# Patient Record
Sex: Male | Born: 1985 | Race: Black or African American | Hispanic: No | Marital: Single
Health system: Midwestern US, Community
[De-identification: ages and names within clinical notes are randomized; demographics above are authoritative.]

## PROBLEM LIST (undated history)

## (undated) DIAGNOSIS — M792 Neuralgia and neuritis, unspecified: Secondary | ICD-10-CM

## (undated) DIAGNOSIS — G40909 Epilepsy, unspecified, not intractable, without status epilepticus: Principal | ICD-10-CM

## (undated) DIAGNOSIS — R569 Unspecified convulsions: Secondary | ICD-10-CM

## (undated) DIAGNOSIS — D72819 Decreased white blood cell count, unspecified: Secondary | ICD-10-CM

---

## 2011-08-17 ENCOUNTER — Inpatient Hospital Stay
Admit: 2011-08-17 | Disposition: A | Discharge status: Home / Self Care | Source: Home / Self Care | Admitting: Emergency Medicine

## 2011-08-17 LAB — URINALYSIS
Bilirubin Urine: NEGATIVE
Glucose, Ur: NEGATIVE mg/dL
Ketones, Urine: NEGATIVE mg/dL
Leukocyte Esterase, Urine: NEGATIVE
Nitrite, Urine: NEGATIVE
Protein, UA: NEGATIVE mg/dL
Specific Gravity, UA: >=1.03 (ref 1.005–1.035)
Urobilinogen, Urine: 0.2 EU/dL (ref 0.2–1.0)
pH, UA: 5.5 (ref 5.0–8.0)

## 2011-08-17 LAB — DRUG SCREEN MULTI URINE
Amphetamine Screen, Urine: NEGATIVE
Barbiturate Screen, Ur: NEGATIVE
Benzodiazepine Screen, Urine: NEGATIVE
Cannabinoid Scrn, Ur: POSITIVE — AB
Cocaine Metabolite Screen, Urine: NEGATIVE
Opiate Scrn, Ur: POSITIVE — AB
PCP Screen, Urine: NEGATIVE
Tricyclic: NEGATIVE

## 2011-08-17 LAB — POCT UREA (BUN): POC BUN: 5 mg/dL — ABNORMAL LOW (ref 7–25)

## 2011-08-17 LAB — CBC WITH AUTO DIFFERENTIAL
Basophils %: 0 % (ref 0.0–1.0)
Basophils Absolute: 2 /uL (ref 0–200)
Eosinophils %: 0.8 % (ref 0.0–8.0)
Eosinophils Absolute: 45 /uL (ref 15–500)
Hematocrit: 42.4 % (ref 38.5–50.0)
Hemoglobin: 13.8 g/dL (ref 13.2–17.1)
Lymphocytes %: 15.2 % (ref 15.0–45.0)
Lymphocytes Absolute: 870 /uL (ref 850–3900)
MCH: 29.5 pg (ref 27.0–33.0)
MCHC: 32.5 g/dL (ref 32.0–36.0)
MCV: 90.8 fL (ref 80.0–100.0)
MPV: 7.3 fL — ABNORMAL LOW (ref 7.5–11.5)
Monocytes %: 9.6 % (ref 0.0–12.0)
Monocytes Absolute: 548 /uL (ref 200–950)
Neutrophils Absolute: 4263 /uL (ref 1500–7800)
Platelets: 219 10*3/uL (ref 140–400)
RBC: 4.67 10*6/uL (ref 4.20–5.80)
RDW: 13.7 % (ref 11.0–15.0)
Segs Relative: 74.4 % (ref 40.0–80.0)
WBC: 5.7 10*3/uL (ref 3.8–10.8)

## 2011-08-17 LAB — POCT CALCIUM IONIZED: POC Ionized Calcium: 4.8 mg/dL (ref 4.5–5.3)

## 2011-08-17 LAB — HEPATIC FUNCTION PANEL
ALT: 96 U/L — ABNORMAL HIGH (ref 9–60)
AST: 82 U/L — ABNORMAL HIGH (ref 17–59)
Albumin: 4.3 g/dL (ref 3.6–5.1)
Alkaline Phosphatase: 119 U/L — ABNORMAL HIGH (ref 40–115)
Bilirubin, Direct: 0 mg/dL (ref 0.0–0.2)
Total Bilirubin: 0.8 mg/dL (ref 0.2–1.2)
Total Protein: 7.5 g/dL (ref 6.2–8.3)

## 2011-08-17 LAB — RENAL FUNCTION PANEL
1/Creatinine: 1.23 ratio
Albumin: 4.2 g/dL (ref 3.6–5.1)
Anion Gap: 9 mmol/L (ref 3–16)
BUN: 7 mg/dL (ref 7–25)
CO2: 28 mmol/L (ref 21–33)
Calcium: 9.1 mg/dL (ref 8.6–10.2)
Chloride: 102 mmol/L (ref 98–110)
Creatinine: 0.81 mg/dL (ref 0.50–1.30)
GFR Est, African/Amer: 142 See note.
GFR Est: 117 See note.
Glucose: 91 mg/dL (ref 65–99)
Phosphorus: 3.1 mg/dL (ref 2.5–4.5)
Potassium: 4.3 mmol/L (ref 3.5–5.3)
Sodium: 139 mmol/L (ref 135–146)

## 2011-08-17 LAB — POC ANION GAP: POC Anion Gap: 18 (ref 10–20)

## 2011-08-17 LAB — POCT GLUCOSE: POC Glucose: 105 mg/dL — ABNORMAL HIGH (ref 65–99)

## 2011-08-17 LAB — POCT POTASSIUM: POC Potassium: 4.1 mmol/L (ref 3.5–5.3)

## 2011-08-17 LAB — POCT CHLORIDE: POC Chloride: 101 mmol/L (ref 98–110)

## 2011-08-17 LAB — POCT CREATININE: POC Creatinine: 1 mg/dL (ref 0.6–1.3)

## 2011-08-17 LAB — TSH: TSH: 0.61 mIU/L (ref 0.45–4.50)

## 2011-08-17 LAB — CK: Total CK: 501 U/L — ABNORMAL HIGH (ref 20–200)

## 2011-08-17 LAB — LACTIC ACID: Lactate: 0.9 mmol/L (ref 0.5–2.2)

## 2011-08-17 LAB — OSMOLALITY: Osmolality Meas: 275 mOsm/kg — ABNORMAL LOW (ref 278–305)

## 2011-08-17 LAB — POCT OCCULT BLOOD STOOL COLON CA SCREEN 1-3: Fecal Occult Blood, POC: NEGATIVE

## 2011-08-17 LAB — POC TOTAL CO2 MEASURED: POC TCO2 Measured: 26 mmol/L (ref 21–32)

## 2011-08-17 LAB — POCT SODIUM: POC Sodium: 140 mmol/L (ref 135–146)

## 2011-08-17 LAB — T4, FREE: T4 Free: 1.53 ng/dL (ref 0.61–1.76)

## 2011-08-17 LAB — AMMONIA: Ammonia: 51 ug/dL (ref 15–51)

## 2011-08-17 LAB — ETHANOL: Ethanol Lvl: <10 mg/dL (ref 0–10)

## 2011-08-17 LAB — MAGNESIUM: Magnesium: 2.1 mg/dL (ref 1.5–2.5)

## 2011-08-17 MED ADMIN — 0.9 % sodium chloride infusion: INTRAVENOUS | @ 18:00:00 | NDC 00338004904

## 2011-08-17 MED ADMIN — fosphenytoin sodium (CEREBYX) 1,000 mg PE in sodium chloride 0.9 % 100 mL IVPB: INTRAVENOUS | @ 22:00:00 | NDC 63323040302

## 2011-08-17 MED ADMIN — LORazepam (ATIVAN) 2 MG/ML injection: INTRAVENOUS | @ 15:00:00 | NDC 10019010239

## 2011-08-17 MED ADMIN — acetaminophen (TYLENOL) tablet 650 mg: 650 mg | ORAL | @ 23:00:00 | NDC 50580050130

## 2011-08-17 MED FILL — LORAZEPAM 2 MG/ML IJ SOLN: 2 MG/ML | INTRAMUSCULAR | Qty: 1

## 2011-08-17 MED FILL — SUMATRIPTAN SUCCINATE 25 MG PO TABS: 25 MG | ORAL | Qty: 1

## 2011-08-17 MED FILL — FOSPHENYTOIN SODIUM 100 MG PE/2ML IJ SOLN: 100 MG PE/2ML | INTRAMUSCULAR | Qty: 100

## 2011-08-17 MED FILL — ACETAMINOPHEN 325 MG PO TABS: 325 MG | ORAL | Qty: 2

## 2011-08-17 MED FILL — FOSPHENYTOIN SODIUM 100 MG PE/2ML IJ SOLN: 100 MG PE/2ML | INTRAMUSCULAR | Qty: 1000

## 2011-08-17 NOTE — ED Notes (Signed)
Residents in seeing pt.     Gena Fray, RN  08/17/11 1451

## 2011-08-17 NOTE — ED Notes (Signed)
Pt continues sleeping quietly, awakens easily, informed of need for urine sample. Urinal given to pt.    Gena Fray, RN  08/17/11 [phone]

## 2011-08-17 NOTE — ED Notes (Signed)
Report to [facility] on 6th floor.    Gena Fray, RN  08/17/11 1506

## 2011-08-17 NOTE — ED Notes (Signed)
26 yo bm to ed s/p ? Seizure today during sleep. Bit his tongue. States throbbing headache. Seizure precautions in place.    Gordy Levan, RN  08/17/11 1015

## 2011-08-17 NOTE — H&P (Incomplete)
HISTORY AND PHYSICAL      Hospital Day: 1                                                        Code:No Order  Admit Date: 08/17/2011                                             PCP: No primary provider on file.                                  CC: Seizure    HISTORY OF PRESENT ILLNESS:   26 yr old [place] American male with PMH of migraine and seizure presented at ER today with likely seizures. According to the patient, he was visiting his parents at home last night when he woke up from his sleep and found both of his hands shaking. Pt also found laceration on left side of his tongue as well as blood on his pillow, face, and chest. Pt states he felt confused for approximately first two minutes after waking up and does not recall having a seizure. Soon afterwards he vomited bilious material. He denied having any urinary symptoms, SOB, chest pain, fever, chills, diarrhea/constipation. Pt also denied using any alcohol or illegal drugs last night although he admitted having used drugs in the past. According to his father, pt had a history of another seizure last October at [place] but was not admitted to hospital.   During admission, pt was found to be oriented x3 but was in a state of somnolence. He was very slow in responding to our questions and continually fell asleep. We also found what appeared to be track marks on his left arm, but the pt stated they were from past ER admissions.     PAST HISTORY:   Seizure (October, 2012), Migraine    History   Alcohol Use   ??? Yes     social     History   Drug Use   ??? Yes   ??? Special: Marijuana     History   Smoking status   ??? Current Everyday Smoker -- 0.50 packs/day   ??? Types: Cigarettes   Smokeless tobacco   ??? Not on file       Family History:   Uncle: Seizure.  Father: DM    MEDICATIONS:   Home Meds:   No current facility-administered medications on file prior to encounter.     No current outpatient prescriptions on file prior to encounter.     Scheduled Meds:       Continuous Infusions:     PRN Meds:       Allergies: No Known Allergies    REVIEW OF SYSTEMS:       History obtained from {source of history:310783}  ?? GEN:  (+) lethargic, confusion ; (-) fevers, chills, wt gain/loss  ?? HEENT:  (+)  Laceration on left side of tongue. Bloody sputum; (-) sore throat  ?? PULM:  (+)  ; (-) dyspnea, cough, sputum, pleurisy  ?? CV:  (+)  ; (-) chest pain, SOB, edema, syncope  ?? GI:  (+)  ; (-)  diarrhea, constipation, abd pain, incontinence  ?? GU:  (+)  ; (-) dysuria, frequency, urgency, incontinence  ?? HEME:  (+)  ; (-) bleeding, bruising, blood thinners   ?? MSK:  (+)  ; (-) joint pain, swelling  ?? NEURO: (+) weakness ; (-) weakness, paresthesia, gait difficulty  ?? SKIN :  (+)  ; (-) rashes, ulcers  ?? ENDO:  (+)  ; (-) abnormal sugars, hot/cold intolerance    PHYSICAL EXAM:       Vitals: BP 146/67   Pulse 87   Temp(Src) 98.5 ??F (36.9 ??C) (Oral)   Resp 16   Ht 6\' 3"  (1.905 m)   Wt 160 lb (72.576 kg)   BMI 20 kg/m2   SpO2 95%    I/O:  No intake or output data in the 24 hours ending 08/17/11 1326          Physical Examination:   ?? Gen: lethargic, laceration on left side of tongue, appears stated age  ?? Skin: track marks on left arm (pt states these are from past ER admissions)  ?? HEENT: bloody sputum  ?? Pulm: CTAB,   ?? CV: RRR, no murmurs, nl s1/s2, no JVD  ?? GI: soft, no pain, no masses palpated  ?? MSK: periph pulses intact b/l, no periph edema  ?? Neuro: Alert/oriented x3, no focal weakness, pupils reactive, LT sensation grossly intact, CN 2-12 grossly intact/symmetric    DATA:       Labs:  CBC: No results found for this basename: WBC, HGB, HCT, PLT,  in the last 72 hours    BMP:   Recent Labs      08/17/11   1050   CREATININE  1.0     POC Labs:   Recent Labs      08/17/11   1050   POCNA  140   POCK  4.1   POCCL  101   POCBUN  5*   POCGLU  105*        Rad:    CT head w/o contrast (08/17/11)   Opacification of the left maxillary antrum with bowing of the medial antral wall. Findings could be  associated with underlying mass and recommendation is made for standard CT paranasal sinuses for further evaluation.   Otherwise unremarkable CT head    EKG: none on file  Echo: None on file    ASSESSMENT AND PLAN:   Roy Weaver is a 26 y.o. male, who was admitted with Seizures.         Code Status:No Order  FEN:    PPX:   LINES:   DISPO:     -----------------------------  Tania Ade, MS 3 (Serving as scribe)  Pager: [phone]   08/17/2011 1:26 PM

## 2011-08-17 NOTE — ED Notes (Signed)
Straight cath done without difficulty after pt having difficulty voiding in urinal on own. When pants removed to do straight cath, orange cap like the type of cap found on an insulin syringe found in pants. Will inform MD and nurse on 6 th floor. Pt awakens, but groggy, fell asleep after straight cath placed and urine draining. Urine obtained and sent.     Gena Fray, RN  08/17/11 1432

## 2011-08-17 NOTE — ED Notes (Signed)
Report received from Cristopher Peru RN. Pt returned from CT, reported to have 2 seizures in CT. Pt now resting quietly, father at side.     Gena Fray, RN  08/17/11 1448

## 2011-08-17 NOTE — H&P (Signed)
I have seen and examined this pt.  I have reviewed the findings and discussed the care w/ the resident and I agree w/ the plan.  Acute seizure in the setting of known drug abuse hx.  Uncelar whether secondary to opiate vs etoh withdrawal vs underlying organic dz- ? Mass on ct head- obtain mri/ obtain eeg/ initiate iv dilantin/ place on neuro checks and seizure precautions.  Await urine tox screen.  [name], M.D.

## 2011-08-17 NOTE — Progress Notes (Signed)
Pt arrived to unit from ED @ 1520hrs.  Pt somnolent, unresponsive.  Pt whispered name & DOB after several attempts to assess orientation.  Attempted neuro assessment.  PERRLA. Pt uncooperative.  A:  Seizure precautions initiated upon arrival.  Spoke with neurology consult who said they will see patient tomorrow.  Pt more arousable @ 1830. MRI checklist completed & obtained emergency contact for mother, Faheem Ziemann.  Pt in MRI.  Will cont to monitor per POC.  Christianne Dolin RN

## 2011-08-17 NOTE — H&P (Signed)
HISTORY AND PHYSICAL   Hospital Day: 1 Code:No Order   Admit Date: 08/17/2011 PCP: No primary provider on file.   CC: Seizure   HISTORY OF PRESENT ILLNESS:    26 yr old [place] American male with PMH of migraine and seizure presented at ER today with likely seizures. According to the patient, he was visiting his parents at home last night when he woke up from his sleep and found both of his hands shaking. Pt also found laceration on left side of his tongue as well as blood on his pillow, face, and chest. Pt states he felt confused for approximately first two minutes after waking up and does not recall having a seizure. Soon afterwards he vomited bilious material. He denied having any urinary symptoms, SOB, chest pain, fever, chills, diarrhea/constipation. Pt also denied using any alcohol or illegal drugs last night although he admitted having used drugs in the past. According to his father, pt had a history of another seizure last October.  During admission, pt was found to be oriented x3 but was in a state of somnolence. He was very slow in responding to our questions and continually fell asleep. We also found what appeared to be track marks on his left arm, but the pt stated they were from past ER admissions.   PAST HISTORY:    Seizure (October, 2012), Migraine   History    Alcohol Use    ???  Yes      social      History    Drug Use    ???  Yes    ???  Special:  Marijuana      History    Smoking status    ???  Current Everyday Smoker -- 0.50 packs/day    ???  Types:  Cigarettes    Smokeless tobacco    ???  Not on file      Family History:   Uncle: Seizure.  Father: DM   MEDICATIONS:    Home Meds:   No current facility-administered medications on file prior to encounter.      No current outpatient prescriptions on file prior to encounter.      Scheduled Meds:     Continuous Infusions:     PRN Meds:     Allergies: No Known Allergies   REVIEW OF SYSTEMS:    History obtained from the patient   GEN: (+) lethargic, confusion ; (-)  fevers, chills, wt gain/loss   HEENT: (+) Laceration on left side of tongue. Bloody sputum; (-) sore throat   PULM: (+) ; (-) dyspnea, cough, sputum, pleurisy   CV: (+) ; (-) chest pain, SOB, edema, syncope   GI: (+) ; (-) diarrhea, constipation, abd pain, incontinence   GU: (+) ; (-) dysuria, frequency, urgency, incontinence   HEME: (+) ; (-) bleeding, bruising, blood thinners   MSK: (+) ; (-) joint pain, swelling   NEURO: (+) weakness ; (-) weakness, paresthesia, gait difficulty   SKIN : (+) ; (-) rashes, ulcers   ENDO: (+) ; (-) abnormal sugars, hot/cold intolerance  PHYSICAL EXAM:    Vitals: BP 146/67   Pulse 87   Temp(Src) 98.5 ??F (36.9 ??C) (Oral)   Resp 16   Ht 6\' 3"  (1.905 m)   Wt 160 lb (72.576 kg)   BMI 20 kg/m2   SpO2 95%   I/O: No intake or output data in the 24 hours ending 08/17/11 1326       Physical Examination:  Gen: lethargic, laceration on left side of tongue, appears stated age   Skin: track marks on left arm (pt states these are from past ER admissions)   HEENT: bloody sputum   Pulm: CTAB,   CV: RRR, no murmurs, nl s1/s2, no JVD   GI: soft, no pain, no masses palpated   MSK: periph pulses intact b/l, no periph edema   Neuro: Alert/oriented x3, no focal weakness, pupils reactive, LT sensation grossly intact, CN 2-12 grossly intact/symmetric, + babinski  DATA:    Labs:   CBC: No results found for this basename: WBC, HGB, HCT, PLT, in the last 72 hours   BMP:   Recent Labs     08/17/11   1050    CREATININE  1.0      POC Labs:   Recent Labs     08/17/11   1050    POCNA  140    POCK  4.1    POCCL  101    POCBUN  5*    POCGLU  105*      Rad:   CT head w/o contrast (08/17/11)   Opacification of the left maxillary antrum with bowing of the medial antral wall. Findings could be associated with underlying mass and recommendation is made for standard CT paranasal sinuses for further evaluation.   Otherwise unremarkable CT head   EKG: none on file   Echo: None on file   ASSESSMENT AND PLAN:    Roy Weaver is a 26 y.o. male, who was admitted with Seizures.     Acute Encephalopathy - possibly 2/2 to Seizure, most likely drug induced. Pt had a seizure prior in October where he was treated in [place]. [place]. Unsure if seizure was witnessed in the past. Seizure was not witnessed today. +tongue bitig, and previous seizure. No acute changes in the CT, sinus mass present. - see resluts above. + u tox in the past. Will check infectious vs electrolyte imbalance vs metabolic cause  - NS @ 125cc/hr  - treat 2nd seizure - start patient on Phenytoin   - Neurology consult -Dr. Ronnald Ramp, EEG  -place on seizure precautions, neurochecks  - -f/u on MRI  -f/u on CBC w/Diff, Renal panel, LFT, Ammonia, Serum osmolality, Urine tox, TSH, free t4  -f/u on urine tox, serum tox, serum osmolarity, Hep C, GC PCR, VDRL, HIV, BC x2, urine culture, UA, CK    Hx of Migranes  - Imitrax 25mg  po daily PRN      Code Status:No Order   FEN: NPO for now  PPX: IV fluids 125 cc/hr  LINES: peripheral   DISPO: to the floors    Pt was staffed and discussed with Dr. Sidney Ace Anayiah Howden  [phone]  PGY1

## 2011-08-17 NOTE — ED Provider Notes (Signed)
[facility]  eMERGENCY dEPARTMENT eNCOUnter        Chief Complaint   Seizures      Nursing Notes, Past Medical Hx, Past Surgical Hx, Social Hx, Allergies, and Family Hx were all reviewed and agreed with, or any disagreements were addressed in the HPI.    History of Present Illness       Roy Weaver is a 26 y.o. male who  has no past medical history on file.  He  has no past surgical history on file.  He presents with altered mental status after having 2 seizures in the last 12 hours or so.  He apparently hit his head last night before, during, or after seizure.  He will not wake up enough to answer questions.  He complains of headache.  He denies nausea or vomiting.    Review of Systems     Review of Systems   Constitutional: Positive for fatigue. Negative for fever and chills.   HENT: Negative for neck pain.    Neurological: Positive for seizures and headaches. Negative for numbness.   All other systems reviewed and are negative.           Medications     Previous Medications    No medications on file       Allergies     He  has no known allergies.    Physical Exam      INITIAL VITALS: BP 146/67   Pulse 87   Temp(Src) 98.5 ??F (36.9 ??C) (Oral)   Resp 16   Ht 6\' 3"  (1.905 m)   Wt 160 lb (72.576 kg)   BMI 20 kg/m2   SpO2 95%   Physical Exam   Nursing note and vitals reviewed.  Constitutional: He is oriented to person, place, and time. He appears well-developed and well-nourished. He appears lethargic. No distress.   HENT:   Head: Normocephalic and atraumatic.   Mouth/Throat:       Eyes: EOM are normal. Pupils are equal, round, and reactive to light.   Neck: Normal range of motion. Neck supple. No tracheal tenderness present.   Cardiovascular: Normal rate, regular rhythm and normal heart sounds.    Pulmonary/Chest: Effort normal and breath sounds normal.   Abdominal: Soft. Bowel sounds are normal. He exhibits no distension. There is no tenderness.   Musculoskeletal: Normal range of motion. He exhibits no edema.    Neurological: He is oriented to person, place, and time. He has normal strength. He appears lethargic. No cranial nerve deficit.   Unable to fully assess secondary to postictal state   Skin: Skin is warm and dry. No rash noted. He is not diaphoretic.   Psychiatric: His speech is delayed. He is slowed.          Diagnostic Results     FROM [facility] 01/2011 - ED visit for seizure in hotel room:  CT Head:  Extensive left maxillary sinus opacification, with possible bone destruction and/or expansion and extension into ethmoid air cells in nasal cavity. Recommend direct endoscopic evaluation to exclude neoplasm. Sinonasal polyposis or chronic sinusitis in the differential diagnosis. Negative brain otherwise.  UTOX:  +canabanoids, +opiates    RADIOLOGY:  CT Head:   Opacification of the left maxillary antrum with bowing of the medial antral wall. Findings could be associated with underlying mass and recommendation is made for standard CT paranasal sinuses for further evaluation.  Otherwise unremarkable CT head.    LABS:   Labs Reviewed  POCT GLUCOSE - Abnormal; Notable for the following:     POC Glucose 105 (*)     All other components within normal limits   POCT UREA (BUN) - Abnormal; Notable for the following:     POC BUN 5 (*)     All other components within normal limits   POCT CHEM BASIC W ICA   POCT POTASSIUM   POCT SODIUM   POC TOTAL CO2 MEASURED   POCT CALCIUM IONIZED   POCT CREATININE   POC ANION GAP   POCT CHLORIDE       RECENT VITALS:  BP: 146/67 mmHg, Temp: 98.5 ??F (36.9 ??C), Pulse: 87 , Resp: 16      ED Course     The patient was given the following medications:  Orders Placed This Encounter   Medications   ??? LORazepam (ATIVAN) 2 MG/ML injection     Sig:      RUSSELL, [place]: cabinet override   ??? LORazepam (ATIVAN) injection 1 mg     Sig:      The patient had another seizure in CT just after completion of the study, witnessed by the CT tech and noted to be a generalized tonic-clonic seizure.    Medical  Decision Making      This patient is a 26 year old male presenting with his second seizure and failure to return to his baseline mental status after one seizure here and 2 seizures prior to arrival.  The patient remained postictal currently.  A tox screen is still pending, but initial metabolic workup is unremarkable.  CT showing an abnormal finding with regard to the left maxillary sinus which will require further evaluation which was also seen on his CT scan at [facility] last fall.  He likely will need to be started on seizure medications and will be admitted to the hospital at this point for further management.    Disposition     CLINICAL IMPRESSION:  1. Seizure        PATIENT REFERRED TO:  No follow-up provider specified.    DISCHARGE MEDICATIONS:  New Prescriptions    No medications on file       DISPOSITION:   Admit to telemetry     (Please note that portions of this note were completed with a voice recognition program.  Efforts were made to edit the dictations but occasionally words are mis-transcribed.)      Durward Fortes, MD  08/17/11 [phone]

## 2011-08-17 NOTE — ED Notes (Signed)
Pt continues sleeping quietly, father at side, vitals stable. Pt does open eyes when name called. Then falls back to sleep.    Gena Fray, RN  08/17/11 [phone]

## 2011-08-17 NOTE — ED Notes (Signed)
Attempt to call report to floor RN, states will call back in few minutes.     Gena Fray, RN  08/17/11 1451

## 2011-08-18 LAB — RENAL FUNCTION PANEL
1/Creatinine: 1.22 ratio
Albumin: 3.9 g/dL (ref 3.6–5.1)
Anion Gap: 10 mmol/L (ref 3–16)
BUN: 10 mg/dL (ref 7–25)
CO2: 26 mmol/L (ref 21–33)
Calcium: 9 mg/dL (ref 8.6–10.2)
Chloride: 103 mmol/L (ref 98–110)
Creatinine: 0.82 mg/dL (ref 0.50–1.30)
GFR Est, African/Amer: 140 See note.
GFR Est: 115 See note.
Glucose: 76 mg/dL (ref 65–99)
Phosphorus: 3.7 mg/dL (ref 2.5–4.5)
Potassium: 4.3 mmol/L (ref 3.5–5.3)
Sodium: 139 mmol/L (ref 135–146)

## 2011-08-18 LAB — CBC WITH AUTO DIFFERENTIAL
Basophils %: 0.5 % (ref 0.0–1.0)
Basophils Absolute: 20 /uL (ref 0–200)
Eosinophils %: 0.9 % (ref 0.0–8.0)
Eosinophils Absolute: 42 /uL (ref 15–500)
Hematocrit: 40.6 % (ref 38.5–50.0)
Hemoglobin: 13.6 g/dL (ref 13.2–17.1)
Lymphocytes %: 20.8 % (ref 15.0–45.0)
Lymphocytes Absolute: 913 /uL (ref 850–3900)
MCH: 30 pg (ref 27.0–33.0)
MCHC: 33.3 g/dL (ref 32.0–36.0)
MCV: 90 fL (ref 80.0–100.0)
MPV: 7.7 fL (ref 7.5–11.5)
Monocytes %: 10.8 % (ref 0.0–12.0)
Monocytes Absolute: 473 /uL (ref 200–950)
Neutrophils Absolute: 2948 /uL (ref 1500–7800)
Platelets: 218 10*3/uL (ref 140–400)
RBC: 4.51 10*6/uL (ref 4.20–5.80)
RDW: 13.7 % (ref 11.0–15.0)
Segs Relative: 67 % (ref 40.0–80.0)
WBC: 4.4 10*3/uL (ref 3.8–10.8)

## 2011-08-18 LAB — MAGNESIUM: Magnesium: 2 mg/dL (ref 1.5–2.5)

## 2011-08-18 MED ADMIN — fosphenytoin sodium (CEREBYX) 100 mg PE in sodium chloride 0.9 % 100 mL IVPB: INTRAVENOUS | @ 05:00:00 | NDC 63323040302

## 2011-08-18 MED ADMIN — enoxaparin (LOVENOX) injection 40 mg: SUBCUTANEOUS | @ 12:00:00 | NDC 00075062040

## 2011-08-18 MED ADMIN — fosphenytoin sodium (CEREBYX) 100 mg PE in sodium chloride 0.9 % 100 mL IVPB: INTRAVENOUS | @ 14:00:00 | NDC 63323040302

## 2011-08-18 MED ADMIN — levofloxacin (LEVAQUIN) 500 mg in sodium chloride 0.9 % 100 mL IVPB: INTRAVENOUS | @ 18:00:00 | NDC 50458016420

## 2011-08-18 MED ADMIN — 0.9 % sodium chloride infusion: INTRAVENOUS | @ 12:00:00 | NDC 00338004904

## 2011-08-18 MED ADMIN — 0.9 % sodium chloride infusion: INTRAVENOUS | @ 04:00:00 | NDC 00338004904

## 2011-08-18 MED ADMIN — enoxaparin (LOVENOX) injection 40 mg: SUBCUTANEOUS | @ 01:00:00 | NDC 00075062040

## 2011-08-18 MED FILL — SODIUM CHLORIDE 0.9 % IV SOLN: 0.9 % | INTRAVENOUS | Qty: 100

## 2011-08-18 MED FILL — FOSPHENYTOIN SODIUM 100 MG PE/2ML IJ SOLN: 100 MG PE/2ML | INTRAMUSCULAR | Qty: 100

## 2011-08-18 MED FILL — LOVENOX 40 MG/0.4ML SC SOLN: 40 MG/0.4ML | SUBCUTANEOUS | Qty: 0.4

## 2011-08-18 NOTE — Plan of Care (Signed)
Problem: SAFETY  Goal: Free from accidental physical injury  Outcome: Ongoing  Free from falls. Fall risk protocol in place. Fall risk band in place. Bed in low position. Bed alarm in use. Remind patient not to get up without assistance. Patient verbalized understanding. Patient admitted for seizures. Seizures precautions in place. Seizures pads on bed x 2. Patient in bed , call light  and bedside tray with personal things in reach. Will continue to monitor safety. During hourly rounding.

## 2011-08-18 NOTE — Progress Notes (Signed)
Resident Progress Note  08/18/2011 11:41 AM  Subjective:   Admit Date: 08/17/2011  PCP: No primary provider on file.  Interval History: Pt. Is still somnolent today. He is alert and oriented x3. He responds to my questions when awoken.     Diet: NPO      Data:   Scheduled Meds:     ??? enoxaparin (LOVENOX) injection  40 mg Subcutaneous Daily   ??? fosphenytoin (CEREBYX) infusion  100 mg PE Intravenous Q8H     Continuous Infusions:     ??? sodium chloride 125 mL/hr at 08/18/11 0817     PRN Meds:acetaminophen, magnesium hydroxide, ondansetron, SUMAtriptan  I/O last 3 completed shifts:  In: 1522 [I.V.:1422; IV Piggyback:100]  Out: 750 [Urine:750]       Intake/Output Summary (Last 24 hours) at 08/18/11 1141  Last data filed at 08/18/11 0653   Gross per 24 hour   Intake   1522 ml   Output    750 ml   Net    772 ml     CBC:   Recent Labs      08/17/11   1648  08/18/11   0627   WBC  5.7  4.4   HGB  13.8  13.6   PLT  219  218     BMP:  Recent Labs      08/17/11   1648  08/18/11   0627   NA  139  139   K  4.3  4.3   CL  102  103   CO2  28  26   BUN  7  10   CREATININE  0.81  0.82   GLUCOSE  91  76     Hepatic: Recent Labs      08/17/11   1648   AST  82*   ALT  96*   BILITOT  0.8   ALKPHOS  119*     Troponin: No results found for this basename: TROPONINI,  in the last 72 hours  BNP: No results found for this basename: BNP,  in the last 72 hours  Lipids: No results found for this basename: CHOL, HDL, LDLCALCU,  in the last 72 hours  ABGs:   No results found for this basename: PHART, PO2ART, PCO2ART     INR: No results found for this basename: INR,  in the last 72 hours  -----------------------------------------------------------------    MRI BRAIN WITHOUT CONTRAST.   HISTORY: Seizure.   COMPARISON STUDY: None.   FINDINGS: Routine MRI brain without contrast protocol.   Study somewhat limited due to patient motion artifact on multiple   pulse sequences. Best images were received for interpretation.   The ventricles appear normal in  size, shape, and configuration.   No focal parenchymal signal abnormality identified. There are no   masses or midline shift. No abnormal extra-axial fluid collection   identified. Brainstem and posterior fossa appear within normal   limits. Midline structures are symmetric. The craniocervical   junction is intact. Flow-voids are identified within the major   intracranial vessels. Visualized orbits are intact. Diffuse   mucosal thickening in the ethmoid sinuses and right maxillary   sinus with complete opacification of the left maxillary sinus.   There is mild mass effect with bowing of the medial antral wall.   Remaining paranasal sinuses are clear. Mastoid air cells are well   pneumatized. No findings for diffusion restriction on the current   study.   IMPRESSION:   1. No  findings for acute infarct, hemorrhage, or mass lesion.   2. Opacification of the left maxillary sinus with bowing of the   medial antral wall. Underlying mass or polyp is not excluded.   Correlate clinically. Mild mucosal thickening in the ethmoid and   right maxillary sinuses.     Objective:   Vitals: BP 115/63   Pulse 80   Temp(Src) 98.2 ??F (36.8 ??C) (Oral)   Resp 16   Ht 6\' 3"  (1.905 m)   Wt 160 lb (72.576 kg)   BMI 20 kg/m2   SpO2 100%  General appearance: Somoloent, drowsy, but able to answer questions  Lungs: clear to auscultation bilaterally  Heart: regular rate and rhythm, S1, S2 normal, no murmur, click, rub or gallop  Abdomen: soft, non-tender; bowel sounds normal; no masses,  no organomegaly  Extremities: extremities normal, atraumatic, no cyanosis or edema  Neurologic: Mental status: Alert, oriented, thought content appropriate, Pt is still drowsy    Assessment:   Patient Active Problem List:     Acute encephalopathy    Plan:   rnest Roy Weaver is a 26 y.o. male, who was admitted with Seizure.   Acute Encephalopathy - 2/2 to Seizure. Likely drug induced.   Hx of unwitnessed seizure back in October and treated in [place]. Seizure  also not witnessed this time. Woke up with hands shaking, tongue laceration, blood on face. Family Hx seizure (Uncle). CT & MRI nl other than opacification of left maxillary sinus. Pupils nl size & reactive to light. No remarkable neurological changes other than somnolence. Utox positive for THC & opiates. Electrolytes nl. F/U with cultures.   - Utox - positive for THC & opiates   - Elevated CK (500). CT/MRI above. TSH/T4 nl. UA nl.   - 2nd seizure - start patient on Phenytoin (Cerebyx) 100 mg IV every 8hr, IVF @ 125cc/hr   - Neurology consult (Dr. Ronnald Weaver, Dr.Schmerler) - F/U EEG   - F/U on CBC w/ Diff Daily, Renal panel Daily, Blood/Urine culture   - F/U Serum osmolality - low 275   - place on seizure precautions, neurochecks   - F/U Hep C, RPR (Syphillis) Chlamydia/Gonorrhoeae PCR, VDRL, HIV antibodies  Sinusitis  - Levofloxacin IV q 24hrs     Hx of Migrane - Continue home meds    - Imitrax 25 mg Tab Daily PRN   Code Status:Full Code   FEN: Regular diet  PPX: Lovenox IV 40 mg   DISPO: Discharge possibly tomorrow     Pt was staffed and discussed with Dr. Brooks Sailors Clemmie Buelna  [phone]  PGY1

## 2011-08-18 NOTE — Progress Notes (Incomplete)
Medical Student Progress Note      Hospital Day:                                                           Code:Full Code  Admit Date: 08/17/2011                                             PCP: No primary provider on file.                                SUBJECTIVE:   Interval Hx: Pt was examined by bedside this morning. He appeared very lethargic but oriented x3. Does not claim any pain, nausea. No activity of seizure last night.    MEDICATIONS:   Scheduled Meds:     ??? enoxaparin (LOVENOX) injection  40 mg Subcutaneous Daily   ??? fosphenytoin (CEREBYX) infusion  100 mg PE Intravenous Q8H      Continuous Infusions:     ??? sodium chloride 125 mL/hr at 08/17/11 2331     PRN Meds:acetaminophen, magnesium hydroxide, ondansetron, SUMAtriptan    Allergies: No Known Allergies    PHYSICAL EXAM:       Vitals: BP 98/55   Pulse 75   Temp(Src) 98.2 ??F (36.8 ??C) (Oral)   Resp 16   Ht 6\' 3"  (1.905 m)   Wt 160 lb (72.576 kg)   BMI 20 kg/m2   SpO2 97%    I/O:    Intake/Output Summary (Last 24 hours) at 08/18/11 1610  Last data filed at 08/18/11 0106   Gross per 24 hour   Intake    100 ml   Output      0 ml   Net    100 ml     I/O this shift:  In: 100 [IV Piggyback:100]  Out: -        Physical Examination:   ?? Gen: lethargic, appears stated age, laceration on left side of tongue, appears stated age  ?? Skin: texture and turgor normal, track marks on left arm  ?? HEENT: dry mucosa  ?? Neck: supple, no LAD, no bruits  ?? Pulm: CTAB  ?? CV: RRR, no murmurs, nl s1/s2, no JVD  ?? GI: soft, no pain, no masses palpated  ?? MSK: periph pulses intact b/l, no periph edema  ?? Neuro: Oriented x3. Slow to response and quick to fall back asleep. no focal weakness, LT sensation grossly intact, no pronator drift, CN 2-12 grossly intact/symmetric      DATA:       Labs:  CBC:   Recent Labs      08/17/11   1648   WBC  5.7   HGB  13.8   HCT  42.4   PLT  219       BMP: Recent Labs      08/17/11   1050  08/17/11   1648   NA   --   139   K   --   4.3   CL   --    102   CO2   --   28  BUN   --   7   CREATININE  1.0  0.81   GLUCOSE   --   91     LFT's: Recent Labs      08/17/11   1648   AST  82*   ALT  96*   BILITOT  0.8   ALKPHOS  119*       U/A:  Recent Labs      08/17/11   1430   COLORU  Yellow   PHUR  5.5   WBCUA  0-3   RBCUA  0-3   BACTERIA  Few*   CLARITYU  Slt Cloudy*   SPECGRAV  >=1.030   LEUKOCYTESUR  Negative   UROBILINOGEN  0.2   BILIRUBINUR  Negative   BLOODU  Trace*   GLUCOSEU  Negative   AMORPHOUS  Present*        Rad:   CT head w/o contrast (08/17/11)   Opacification of the left maxillary antrum with bowing of the medial antral wall. Findings could be associated with underlying mass and recommendation is made for standard CT paranasal sinuses for further evaluation.   Otherwise unremarkable CT head.   --   MRI brain (08/17/11)   1. No findings for acute infarct, hemorrhage, or mass lesion.   2. Opacification of the left maxillary sinus with bowing of the medial antral wall. Underlying mass or polyp is not excluded. Correlate clinically. Mild mucosal thickening in the ethmoid and right maxillary sinuses.     EKG: none on file  Echo:none on file  Micro: Blood/urine culture pending    ASSESSMENT AND PLAN:   Roy Weaver is a 26 y.o. male, who was admitted with Seizure.     Acute Encephalopathy - 2/2 to Seizure. Likely drug induced.   Hx of unwitnessed seizure back in October and treated in [place]. Seizure also not witnessed this time. Woke up with hands shaking, tongue laceration, blood on face. Family Hx seizure (Uncle). CT & MRI nl other than opacification of left maxillary sinus. Pupils nl size & reactive to light. No remarkable neurological changes other than somnolence. Utox positive for THC & opiates. Electrolytes nl. F/U with cultures.    - Utox - positive for THC & opiates    - Elevated CK (500). CT/MRI brain nl. TSH/T4 nl. UA nl.    - 2nd seizure - start patient on Phenytoin (Cerebyx) 100 mg IV every 8hr, IVF @ 125cc/hr    - Neurology consult (Dr.  Ronnald Ramp, Dr.Schmerler) - F/U EEG    - F/U on CBC w/ Diff Daily, Renal panel Daily, Blood/Urine culture    - F/U Serum osmolality - low 275    - place on seizure precautions, neurochecks    - F/U Hep C, RPR (Syphillis) Chlamydia/Gonorrhoeae PCR, VDRL, HIV antibodies     Hx of Migrane - Continue home meds    - Imitrax 25 mg Tab Daily PRN      Code Status:Full Code  FEN: NPO  PPX: Lovenox IV 40 mg  DISPO: EEG    -----------------------------  Tania Ade, MS-III (Serving as a scribe for [name], MD.)  Pager: [phone]   08/18/2011 6:07 AM

## 2011-08-18 NOTE — Progress Notes (Addendum)
VSS remain stable.  Tolerated General diet w/o event.  Call light and other needs are in reach.  Seizure precautions in place.  Seizures pads on bed x 2. Will monitor and cont POC.  Christianne Dolin RN

## 2011-08-18 NOTE — Plan of Care (Signed)
Problem: Falls - Risk of  Goal: Absence of falls  Outcome: Ongoing  Pt remains free from falls through this shift, alert and oriented x2-3, fall protocol in place, bed alarm on and in low position, padded side rails elevated x2, nonskid footwear on, pt moved close to nurses station.  Call light and pt belongings within reach, uses call light appropriately, no attempts to get oob without assistance.  Will continue to monitor for pt safety.    Problem: SAFETY  Goal: Free from accidental physical injury  Outcome: Ongoing  Pt at risk for seizures, side rails padded, pt remains free from injury at this time.  Will continue to monitor pt.

## 2011-08-18 NOTE — Consults (Signed)
PATIENT NAME:                 PA #:            MR #Roy Weaver, Roy Weaver                [id number]       [id number]            ATTENDING PHYSICIAN:                     CONSULTATION DATE:           Marcie Bal, MD                    08/18/2011                   CONSULTING PHYSICIAN:                    DISCHARGE DATE:              Tommie Ard, MD                                                 PRIMARY CARE PHYSICIAN:                  REFERRING PHYSICIAN:         FAMILY DOCTOR NO                                                      DATE OF BIRTH:   AGE:           PATIENT TYPE:     RM #:              July 26, 1985       25             IPJ               [id number]                  NEUROLOGY CONSULTATION     Patient referred by Roy Weaver for neurology consultation.  Patient is a  26 year old man.  Patient has had history of migraine.  He had a singular  seizure in the past as well.  He presented to the emergency room when upon  awakening from sleep his hands were shaking, and he had a laceration to the  left side of his tongue, blood on the pillow and face.  Upon awakening, he  also felt confused.     The patient does have a history of migraine.     In October he also had a seizure.  Has history of migraine headaches.     SOCIAL HISTORY:  Alcohol socially only.  He smokes 0.5 packs per day.     Emergency room initiated the use of anticonvulsant and Dilantin.  Apparently  last fall in October was at [facility] for similar situation.     FAMILY HISTORY:  _____ seizures.  Father has diabetes.     REVIEW  OF SYSTEMS:  As delineated by Dr. Lourdes Sledge admission notes.       MRI scan reveals unremarkable.     PHYSICAL EXAMINATION:  VITAL SIGNS:  Weight 160 pounds.  Height 6 feet 3 inches.  Respirations 16,  temperature 38, pulse 86, blood pressure 150/70.  GENERAL:  The patient is ready, arousable.  HEENT:  Pupils are equal.  Extraocular movements are full.  Fundi flat.   Mucous exam looks well.  NECK:   Supple.  EXTREMITIES:  Reflexes are preserved.  Plantar stimulation with flexor  response.  NEURO:  Cerebellar testing shows no dysmetria, normal coordination.     IMPRESSION:  Second seizure.  Will continue with anticonvulsants.  Await EEG.   No driving.  He will need to have follow-up arrangements.  I do not know his  primary care doctor.  If he has none, then perhaps [facility] clinic  needs to follow up.                                            Tommie Ard, MD     [id number]  DD: 08/18/2011 07:33  DT: 08/18/2011 11:11  Job #: [id number]  CC: Tommie Ard, MD  CC: Marcie Bal, MD

## 2011-08-18 NOTE — Progress Notes (Signed)
EEG:  Test done as per order. Test at readers station for Interpretation by Dr.

## 2011-08-18 NOTE — Progress Notes (Signed)
NEUROLOGY DICTATED    AGREE WITH PHENYTOIN FOR LIKELY 2ND SZ.    NO DRIVING.    AWAIT EEG.      M Noga Fogg,MD

## 2011-08-18 NOTE — Progress Notes (Signed)
I have seen and examined this pt.  I have reviewed the findings and discussed the care w/ the resident and I agree w/ the plan.  Acute seizure- recurrent.  Initiated on cerebrex.  Acute sinusitis- initiate iv levaquin.  [name], M.D.

## 2011-08-19 LAB — CBC WITH AUTO DIFFERENTIAL
Basophils %: 0 % (ref 0.0–1.0)
Basophils Absolute: 0 /uL (ref 0–200)
Eosinophils %: 2 % (ref 0.0–8.0)
Eosinophils Absolute: 72 /uL (ref 15–500)
Hematocrit: 39.3 % (ref 38.5–50.0)
Hemoglobin: 13.2 g/dL (ref 13.2–17.1)
Lymphocytes %: 29 % (ref 15.0–45.0)
Lymphocytes Absolute: 1044 /uL (ref 850–3900)
MCH: 30.1 pg (ref 27.0–33.0)
MCHC: 33.5 g/dL (ref 32.0–36.0)
MCV: 89.7 fL (ref 80.0–100.0)
MPV: 7.5 fL (ref 7.5–11.5)
Monocytes %: 12 % (ref 0.0–12.0)
Monocytes Absolute: 432 /uL (ref 200–950)
Neutrophils Absolute: 2052 /uL (ref 1500–7800)
Platelets: 222 10*3/uL (ref 140–400)
RBC: 4.38 10*6/uL (ref 4.20–5.80)
RDW: 13.8 % (ref 11.0–15.0)
Segs Relative: 57 % (ref 40.0–80.0)
WBC: 3.6 10*3/uL — ABNORMAL LOW (ref 3.8–10.8)

## 2011-08-19 LAB — HIV SCREEN
HIV 1/O/2 Abs, Qual: NONREACTIVE
HIV 1/O/2 Abs-Index Value: <1 (ref <1.00)

## 2011-08-19 LAB — RENAL FUNCTION PANEL
1/Creatinine: 1.35 ratio
Albumin: 3.5 g/dL — ABNORMAL LOW (ref 3.6–5.1)
Anion Gap: 7 mmol/L (ref 3–16)
BUN: 7 mg/dL (ref 7–25)
CO2: 28 mmol/L (ref 21–33)
Calcium: 8.8 mg/dL (ref 8.6–10.2)
Chloride: 102 mmol/L (ref 98–110)
Creatinine: 0.74 mg/dL (ref 0.50–1.30)
GFR Est, African/Amer: 157 See note.
GFR Est: 130 See note.
Glucose: 96 mg/dL (ref 65–99)
Phosphorus: 3.1 mg/dL (ref 2.5–4.5)
Potassium: 4.2 mmol/L (ref 3.5–5.3)
Sodium: 137 mmol/L (ref 135–146)

## 2011-08-19 LAB — HEPATITIS PANEL, ACUTE
Hep A IgM: NEGATIVE
Hep B Core Ab, IgM: NEGATIVE
Hep B S Ag Confirm: NEGATIVE
Hepatitis C Ab: >11 s/co ratio — ABNORMAL HIGH (ref 0.0–0.9)

## 2011-08-19 LAB — C.TRACHOMATIS N.GONORRHOEAE DNA
C. Trachomatis Amplified: NEGATIVE
N. Gonorrhoeae Amplified: NEGATIVE

## 2011-08-19 LAB — RPR WITH FTA REFLEX: RPR: NONREACTIVE

## 2011-08-19 LAB — MAGNESIUM: Magnesium: 1.8 mg/dL (ref 1.5–2.5)

## 2011-08-19 MED ORDER — SUMATRIPTAN SUCCINATE 25 MG PO TABS
25 MG | ORAL_TABLET | Freq: Every day | ORAL | Status: DC | PRN
Start: 2011-08-19 — End: 2013-02-27

## 2011-08-19 MED ORDER — AMOXICILLIN-POT CLAVULANATE 875-125 MG PO TABS
875-125 MG | ORAL_TABLET | Freq: Two times a day (BID) | ORAL | Status: AC
Start: 2011-08-19 — End: 2011-08-29

## 2011-08-19 MED ORDER — PHENYTOIN SODIUM EXTENDED 300 MG PO CAPS
300 MG | ORAL_CAPSULE | Freq: Every day | ORAL | Status: DC
Start: 2011-08-19 — End: 2013-04-03

## 2011-08-19 MED ADMIN — fosphenytoin sodium (CEREBYX) 100 mg PE in sodium chloride 0.9 % 100 mL IVPB: INTRAVENOUS | @ 06:00:00 | NDC 63323040302

## 2011-08-19 MED ADMIN — fosphenytoin sodium (CEREBYX) 100 mg PE in sodium chloride 0.9 % 100 mL IVPB: INTRAVENOUS | NDC 63323040302

## 2011-08-19 MED ADMIN — fosphenytoin sodium (CEREBYX) 100 mg PE in sodium chloride 0.9 % 100 mL IVPB: INTRAVENOUS | @ 13:00:00 | NDC 63323040302

## 2011-08-19 MED ADMIN — enoxaparin (LOVENOX) injection 40 mg: SUBCUTANEOUS | @ 13:00:00 | NDC 00075062040

## 2011-08-19 MED FILL — FOSPHENYTOIN SODIUM 100 MG PE/2ML IJ SOLN: 100 MG PE/2ML | INTRAMUSCULAR | Qty: 100

## 2011-08-19 MED FILL — LOVENOX 40 MG/0.4ML SC SOLN: 40 MG/0.4ML | SUBCUTANEOUS | Qty: 0.4

## 2011-08-19 MED FILL — SODIUM CHLORIDE 0.9 % IV SOLN: 0.9 % | INTRAVENOUS | Qty: 100

## 2011-08-19 NOTE — Procedures (Signed)
PATIENT NAME:                   PA #:             MR #Roy Weaver, Roy Weaver                  [id number]        [id number]         ATTENDING PHYSICIAN:                    SERVICE DATE:  DIS DATE:       Marcie Bal, MD                   08/19/2011                     PRIMARY CARE PHYSICIAN:                 REFERRING PHYSICIAN:           FAMILY DOCTOR NO                                                       DATE OF BIRTH:     AGE:            PATIENT TYPE:      RM #:           [birth year]         25              IPJ                [id number]               CLINICAL HISTORY:  Dysrhythmia, grade 2 slowing.     TECHNICAL SUMMARY:  Electroencephalogram shows 4 to 7 Hz theta activity,  generalized in nature.  Normal 8 Hz alpha activity is not seen.  EKG artifact  shows tachycardia.  Artifactual activity is prominent.  Photic stimulation  resulted in minimal driving.     EEG INTERPRETATION:  Generalized slowing was seen compatible with  encephalopathy of toxic metabolic or degenerative etiologies.  Clinical  correlation is appropriate.  Epileptiform activity was not seen.                                            Tommie Ard, MD     [id number]  DD: 08/19/2011 08:06  DT: 08/19/2011 09:53  Job #: [id number]  CC: Tommie Ard, MD  CC: Marcie Bal, MD

## 2011-08-19 NOTE — Progress Notes (Signed)
NEUROLOGY  Progress Note    8:45 AM3/28/2013  Admit Date: 08/17/2011   Hospital Day: 3                                                   PCP   No primary provider on file.  DOB: 07-12-1985                                                       CodeStatus:Full Code  MRN: [id number]                                                        Diet: General                                                     24 hr interval events:    OBJECTIVE :  Medications        Allergies:    Review of patient's allergies indicates no known allergies.  Current Medications:  Current Facility-Administered Medications   Medication Dose Route Frequency Provider Last Rate Last Dose   ??? levofloxacin (LEVAQUIN) 500 mg in sodium chloride 0.9 % 100 mL IVPB   Intravenous Q24H [name], MD       ??? acetaminophen (TYLENOL) tablet 650 mg  650 mg Oral Q4H PRN [name], MD   650 mg at 08/17/11 1850   ??? magnesium hydroxide (MILK OF MAGNESIA CONCENTRATE) 2400 MG/10ML suspension 2,400 mg  10 mL Oral Daily PRN [name], MD       ??? ondansetron (ZOFRAN) injection 4 mg  4 mg Intravenous Q6H PRN [name], MD       ??? enoxaparin (LOVENOX) injection 40 mg  40 mg Subcutaneous Daily [name], MD   40 mg at 08/19/11 5284   ??? SUMAtriptan (IMITREX) tablet 25 mg  25 mg Oral Daily PRN Syliva Overman, MD       ??? fosphenytoin sodium (CEREBYX) 100 mg PE in sodium chloride 0.9 % 100 mL IVPB  100 mg PE Intravenous Q8H Syliva Overman, MD   100 mg PE at 08/19/11 0838     PRN Meds:  acetaminophen, magnesium hydroxide, ondansetron, SUMAtriptan      Physical    I/O last 3 completed shifts:  In: -   Out: 1025 [Urine:1025]  Patient Vitals for the past 24 hrs:   BP Temp Temp src Pulse Resp SpO2   08/19/11 0451 122/72 mmHg 97.9 ??F (36.6 ??C) Axillary 78  16  98 %   08/19/11 0135 130/67 mmHg 98.6 ??F (37 ??C) Oral 75  16  -   08/18/11 2009 125/63 mmHg 99.7 ??F (37.6 ??C) Oral 80  16  98 %   08/18/11 1545 132/69 mmHg 99.2 ??F (37.3 ??C) Oral 89  16  99 %   08/18/11  0853 115/63 mmHg - - 80  16  100 %         Data    LABS:  CBC:   Recent Labs      08/19/11   0626   WBC  3.6*   HGB  13.2   HCT  39.3   PLT  222   MCV  89.7     Renal:  Recent Labs      08/19/11   0626   NA  137   K  4.2   CL  102   CO2  28   BUN  7   CREATININE  0.74   GLUCOSE  96   CALCIUM  8.8   MG  1.8   PHOS  3.1   ANIONGAP  7     Hepatic: Recent Labs      08/17/11   1648   08/19/11   0626   AST  82*   --    --    ALT  96*   --    --    BILITOT  0.8   --    --    BILIDIR  0.0   --    --    PROT  7.5   --    --    LABALBU  4.3   4.2   < >  3.5*   ALKPHOS  119*   --    --     < > = values in this interval not displayed.     Troponin: No results found for this basename: TROPONINI, CKMB, TOTCK,  in the last 72 hours  BNP: No results found for this basename: BNP,  in the last 72 hours  Lipids: No results found for this basename: CHOL, HDL, LDLCALCU,  in the last 72 hours  ABG: No results found for this basename: PHART, PCO2ART, PO2ART, HCO3ART, BEART, THGBART, O2SATART, TCO2ART,  in the last 72 hours    INR: No results found for this basename: INR,  in the last 72 hours  Lactate:   Recent Labs      08/17/11   1648   LACTATE  0.9       ASSESSMENT AND PLAN  EEG SHOWS SLOWING.    REVIEWED THE [facility] ADMISSION, GIVEN AMOXICILLIN THERE.    CONTINUE PHENYTOIN.  HAS NO PRIMARY DOCTOR AND WOULD CONSIDER [facility] FOLLOW UP.    NO DRIVING FOR NOW.      WILL FOLLOW FROM [place],    [place] Ravis Herne,MD              Tommie Ard, MD

## 2011-08-19 NOTE — Progress Notes (Signed)
Discharge orders received.  Verbal and written discharge instructions given to patient.  RX given to patient.  Verbalized understanding.  Denies pain/discomfort.  Tolerating diet.  Voiding.  Awaiting ride home in room.

## 2011-08-19 NOTE — Discharge Summary (Addendum)
Hospital Discharge Summary    Patient's PCP: No primary provider on file.  Admit Date: 08/17/2011   Discharge Date: 08/19/2011    Admitting Physician: [name], MD  Discharge Physician: Nat Christen   Consults: Neurology (Dr. Yevonne Aline)     HPI:   26 yr old African American male with PMH of migraine and seizure presented at ER today with likely seizures. According to the patient, he was visiting his parents at home last night when he woke up from his sleep and found both of his hands shaking. Pt also found laceration on left side of his tongue as well as blood on his pillow, face, and chest. Pt states he felt confused for approximately first two minutes after waking up and does not recall having a seizure. Soon afterwards he vomited bilious material. He denied having any urinary symptoms, SOB, chest pain, fever, chills, diarrhea/constipation. Pt also denied using any alcohol or illegal drugs last night although he admitted having used drugs in the past. According to his father, pt had a history of another seizure last October.   During admission, pt was found to be oriented x3 but was in a state of somnolence. He was very slow in responding to our questions and continually fell asleep. We also found what appeared to be track marks on his left arm, but the pt stated they were from past ER admissions.     Brief hospital course:    The patient was admitted to the inpatient medicine service on the above noted date after being found in his home "covered with blood." The patient's parents were not sure of the sort of "activity" that their son was involved in, but his father did express concerns that his son may have been using illicit drugs. There were some concerns about possible tonic-clonic seizure activity, and so a full laboratory and imaging diagnostic work-up was done to identify the source of the seizures and the Neurology service via Dr. Yevonne Aline was also consulted. Electrolytes, a TSH, and a urine tox.  Screen was ordered as part of the initial work-up. Imaging including a head MRI was also ordered. Finally because of concerns about IV drug use and sexual behavior, a full hepatitis panel was also ordered. Of note, the patient tested POSITIVE for Hepatitis C. Confirmatory testing and a viral load was ordered in the hospital prior to discharge. An EEG done in the hospital also showed diffuse slowing. Dr. Yevonne Aline recommended discharing the patient on Dilantin. He was also discharged on a 10-day course of Augmentin for sinusitis that was visualized on his head CT scan.     Discharge Diagnoses:   Patient Active Problem List   Diagnosis   ??? Acute encephalopathy       Physical Exam: BP 126/81   Pulse 83   Temp(Src) 98 ??F (36.7 ??C) (Oral)   Resp 18   Ht 6\' 3"  (1.905 m)   Wt 160 lb (72.576 kg)   BMI 20 kg/m2   SpO2 97%  Recent Labs      08/17/11   1050   POCGLU  105*       LABS:  Recent Labs      08/19/11   0626   WBC  3.6*   HGB  13.2   PLT  222      Recent Labs      08/19/11   0626   NA  137   K  4.2   CL  102   CO2  28   BUN  7   CREATININE  0.74   GLUCOSE  96     No results found for this basename: INR,  in the last 72 hours  Significant Diagnostic Studies:    1. Hep C Ab - POSITIVE  2. MRI Brain  IMPRESSION:   1. No findings for acute infarct, hemorrhage, or mass lesion.   2. Opacification of the left maxillary sinus with bowing of the   medial antral wall. Underlying mass or polyp is not excluded.   Correlate clinically. Mild mucosal thickening in the ethmoid and   right maxillary sinuses.    3. EEG - Slowing    Treatments: IV Levoflaxoacin, IV Phenyotin    Discharge Medications:   Cem, [place]   Home Medication Instructions ZOX:[id number]    Printed on:08/19/11 0933   Medication Information                      amoxicillin-clavulanate (AUGMENTIN) 875-125 MG per tablet  Take 1 tablet by mouth 2 times daily for 10 days.             phenytoin (DILANTIN) 300 MG ER capsule  Take 1 capsule by mouth daily.              SUMAtriptan (IMITREX) 25 MG tablet  Take 1 tablet by mouth daily as needed for Migraine.                Activity: As tolerated, NO DRIVING   Diet: regular diet  Wound Care: none needed    Disposition: home  Discharged Condition: Stable  Follow Up: Primary Care Physician in one week    Total time spent on discharge, finalizing medications, referrals and arranging outpatient follow up was more than 30 minutes     If you have any questions or concerns please feel free to contact me at [phone].    I have seen and examined this pt.  I have reviewed the findings and discussed the care w/ the resident and I agree w/ the plan as outlined above.  [name], M.D.

## 2011-08-19 NOTE — Plan of Care (Signed)
Problem: Falls - Risk of  Goal: Absence of falls  Outcome: Completed Date Met:  08/19/11  Remains safe and free from falls and injury.  Calls for assistance appropriately.    Problem: DISCHARGE BARRIERS  Goal: Patient's continuum of care needs are met  Outcome: Completed Date Met:  08/19/11  Appointment with [facility] set up.  RX given to patient.

## 2011-08-19 NOTE — Discharge Instructions (Signed)
Please follow-up with Dr. Lourdes Sledge in the [facility] at [phone]. Call Immediately for an appointment.

## 2011-08-20 LAB — HEPATITIS C VIRUS, RIBA: Hepatitis C Ab Riba: >11 s/co ratio — ABNORMAL HIGH (ref 0.0–0.9)

## 2011-08-23 LAB — DRUGS OF ABUSE 9 SERUM W/ REFLEX
Cannabinoids,Bld: 21.2 ng/ml
Marijuana: 2.4 ng/ml

## 2011-11-19 ENCOUNTER — Inpatient Hospital Stay
Admit: 2011-11-19 | Disposition: A | Discharge status: Home / Self Care | Source: Home / Self Care | Attending: Internal Medicine | Admitting: Internal Medicine

## 2011-11-19 LAB — CBC WITH AUTO DIFFERENTIAL
Basophils %: 0.5 %
Basophils Absolute: 0 10*3/uL (ref 0.0–0.2)
Eosinophils %: 4.8 %
Eosinophils Absolute: 0.3 10*3/uL (ref 0.0–0.6)
Hematocrit: 40.7 % (ref 40.5–52.5)
Hemoglobin: 13.6 g/dL (ref 13.5–17.5)
Lymphocytes %: 28.6 %
Lymphocytes Absolute: 1.5 10*3/uL (ref 1.0–5.1)
MCH: 30.4 pg (ref 26.0–34.0)
MCHC: 33.4 g/dL (ref 31.0–36.0)
MCV: 90.9 fL (ref 80.0–100.0)
MPV: 7.2 fL (ref 5.0–10.5)
Monocytes %: 9.3 %
Monocytes Absolute: 0.5 10*3/uL (ref 0.0–1.3)
Neutrophils %: 56.8 %
Neutrophils Absolute: 2.9 10*3/uL (ref 1.7–7.7)
Platelets: 235 10*3/uL (ref 135–450)
RBC: 4.47 M/uL (ref 4.20–5.90)
RDW: 12.5 % (ref 12.4–15.4)
WBC: 5.2 10*3/uL (ref 4.0–11.0)

## 2011-11-19 LAB — COMPREHENSIVE METABOLIC PANEL
ALT: 17 U/L (ref 10–40)
AST: 23 U/L (ref 15–37)
Albumin/Globulin Ratio: 1.1 (ref 1.1–2.2)
Albumin: 4.2 g/dL (ref 3.4–5.0)
Alkaline Phosphatase: 149 U/L — ABNORMAL HIGH (ref 45–129)
BUN: 10 mg/dL (ref 7–18)
CO2: 17 mEq/L — ABNORMAL LOW (ref 21–32)
Calcium: 9.4 mg/dL (ref 8.3–10.6)
Chloride: 106 mEq/L (ref 99–110)
Creatinine: 0.9 mg/dL (ref 0.9–1.3)
GFR African American: >60 (ref >60)
GFR Non-African American: >60 (ref >60)
Globulin: 4 g/dL
Glucose: 123 mg/dL — ABNORMAL HIGH (ref 70–99)
Potassium: 3.5 mEq/L (ref 3.5–5.1)
Sodium: 141 mEq/L (ref 136–145)
Total Bilirubin: 0.4 mg/dL (ref 0.00–1.00)
Total Protein: 8.2 g/dL (ref 6.4–8.2)

## 2011-11-19 LAB — URINE DRUG SCREEN

## 2011-11-19 LAB — PHENYTOIN LEVEL, TOTAL: Phenytoin Lvl: 2 ug/mL — ABNORMAL LOW (ref 10.0–20.0)

## 2011-11-19 NOTE — Consults (Unsigned)
PATIENT NAME:                 PA #:            MR #RENN, DIROCCO                [id number]       [id number]            ATTENDING PHYSICIAN:                     CONSULTATION DATE:           Danae Chen, MD                   11/19/2011                   CONSULTING PHYSICIAN:                    DISCHARGE DATE:              Maryanna Shape, MD                                            DATE OF BIRTH:   AGE:           PATIENT TYPE:     RM #:              18-Apr-1986       25             IPK               559I                  REFERRING PHYSICIAN:  Dr. Royal Piedra.     REASON FOR CONSULTATION:  Seizure.     HISTORY:  A 26 year old African-American young man.  He is sleepy and has  difficulty staying aroused.  He states that he did take his medication last  night.  He had been changed to phenytoin.  He was admitted about 3 months ago  with a seizure.  He awoke with a laceration on the left side of his tongue  and blood on his pillow.  He was also confused.  Apparently, per old records,  he had a prior seizure in October.  EEG was done in March of this year and  was negative for seizure activity.  He had generalized slowing consistent  with encephalopathy including postictal state.  Current admission was  preceded by seizures at home.  He did not wake up between the 2 seizures and  fell out of bed.  He had reportedly been out partying. He states that he had  not been.  He denied taking any illegal substances.  He reiterated that he  did take his medication.  He had a third seizure in the ER.  Dilantin level  was 2.  He was considered to be in status epilepticus appropriately given his  lack of recovery between seizures.       PAST MEDICAL HISTORY:  Positive for prior seizures, as noted above, history  of migraines.     ALLERGIES:  None.     CURRENT MEDICATIONS:  Phenytoin.       SOCIAL HISTORY:  He does smoke and drink.  Records indicate that he may use  IV drugs.  Patient denies this.      FAMILY HISTORY:  Positive for diabetes.     REVIEW OF SYSTEMS:  Unable to accomplish due to patient lethargy.     PHYSICAL EXAMINATION:  VITAL SIGNS:  Pulse 79, respirations 18, temperature 98.7 axillary, blood  pressure 118/62.  O2 saturation is 97% on room air.  GENERAL:  Lethargic black male easily aroused, but goes back to sleep easily.   He is able to follow commands and respond to questions.  HEENT:  Face symmetric.  Speech when he talks does not have aphasia.  There  is minimal dysarthria likely due to drowsiness and medication.  Pupils are  equal and responsive, unable to see disks.  EOMI without nystagmus.  Hearing  is normal.  Mouth is normal.  Tongue is midline.    NECK:  Supple without lymphadenopathy or bruits.  No cervical or  supraclavicular lymphadenopathy.  COR:  Regular rate and rhythm.  No murmurs.  LUNGS:  Clear to auscultation.  ABDOMEN:  Soft and nontender.  No masses.  NEUROLOGIC:  Cranial nerves 2-12 done in a detailed sequential fashion  negative.  Motor shows normal mass, tone, and movement.  Strength is 5/5.  No  drift.   SKIN:  No rashes.  EXTREMITIES:  2+ throughout.  Plantar responses are flexor.  Unable to test  coordination or gait.     IMPRESSION:    1.  Recurrence of seizure.  The patient reports he had not been using  substances, but there is a possible history of this in old records.  Drug  screen is pending.  Patient stated that he was now taking generic phenytoin.   Generic phenytoin is considered equivalent.  His levels are 80% of brand,  therefore levels may have dropped with this switch.  He may also be a rapid  metabolizer.  Agree with adding Keppra at this time.  2.  Possible history of substance abuse.  3.  History of migraines.     RECOMMENDATIONS:  1.  Continue with brand name Dilantin.  2.  Recheck level in the morning.  3.  If he is able to maintain a good level of brand Dilantin, then he could  be discharged with this alone.  If he has trouble maintaining a level I  would  also discharge him on Keppra 500 mg twice a day.  He should be cautioned that  this can cause irritability and depression which may be severe.    4.  He is currently nonfocal.  Repeat imaging is not indicated unless new  findings are noted, especially in the situation where recurrence is most  likely related to lack of adequate medication.                                            Maryanna Shape, MD     [id number]  DD: 11/19/2011 18:12   DT: 11/20/2011 06:46   Job #: [id number]  CC: Glenice Bow, MD  CC: Maryanna Shape, MD

## 2011-11-19 NOTE — H&P (Unsigned)
PATIENT NAME:                 PA #:            MR #Roy Weaver, Roy Weaver                [id number]       [id number]            ATTENDING PHYSICIAN:                  ADM DATE:   DIS DATE:          Danae Chen, MD                11/19/2011                     DATE OF BIRTH:   AGE:           PATIENT TYPE:     RM #:              [birth year]       25             IPK               559I                  PRIMARY CARE PHYSICIAN:  None.     HISTORY OF PRESENT ILLNESS:  This is a 26 year old, African American  gentleman who really is not answering any of my questions right now; but from  the chart, I learn that he was out partying, he came home, fell out of bed,  had a seizure, and then had 2 more seizures in the emergency room.   Apparently, he was hospitalized at [facility] in March with the same  thing and was discharged home with Dilantin 300 mg daily.  He has not been  compliant with that medication since his level is negative.       SOCIAL HISTORY:  The patient apparently does smoke.  He does drink and  possibly uses IV drugs.       PAST MEDICAL HISTORY:  He does have a seizure disorder.     FAMILY HISTORY:  According to old notes, the patients father has diabetes.     SOCIAL HISTORY:  According to old notes, the patient smokes a 1/2 pack a day  and drinks alcohol socially.      REVIEW OF SYSTEMS:  The patient is unable to answer questions regarding the  review of systems because he is somewhat postictal and also sedated with  Ativan.       PHYSICAL EXAMINATION:  VITAL SIGNS:  Temperature is 100.1, blood pressure 130/64, pulse 91,  respirations 20.  O2 saturations are 94% on room air.    GENERAL:  This is a thin, African American gentleman who briefly rouses, but  cannot really answer questions.  HEENT:  Pupils are reactive.  Nares are patent.  I am unable to assess  throat.  NECK:  Supple.  There is no JVD.  CARDIOVASCULAR:  Regular rate and rhythm.  No significant murmurs, gallops,  or  rubs.  PULMONARY:  Clear to auscultation bilaterally.  No use of accessory muscles.  ABDOMEN:  Bowel sounds are positive.  Soft, nontender, nondistended.    EXTREMITIES:  No significant cyanosis, clubbing, or edema.  NEUROPSYCHIATRIC:  I am unable to assess the psychiatric or neurologic exams  because the patient appears to be somewhat postictal.      DIAGNOSTIC DATA:  White count is 5.2, hemoglobin is 13.6, hematocrit 40.7,  platelets 235, with a normal differential.  Alkaline phosphatase is 149;  otherwise, there are normal LFTs.  Sodium is 141, potassium 3.5, chloride  106, CO2 is 17, BUN and creatinine are 10 and 0.9, glucose 123.       Phenytoin level is negative.     ASSESSMENT AND PLAN:    1.  STATUS EPILEPTICUS:  The patient did have status epilepticus.  He has had  3 seizures.  The patient is now sedated with Ativan.  He is on Keppra b.i.d.,  and he got loaded with Dilantin in the emergency room.  We will start him on  the Dilantin 300 mg daily.  We will get a neurology consultation and proceed  from there.  2.  ACIDOSIS:  The patient does have a mild acidosis.  We will check a lactic  acid level, but this is more likely from the seizure activity.  3.  HYPOKALEMIA:  The patient has a mild hypokalemia.  We will replace that.  4.  DRUG USE:  I will check a tox screen on this patient as well since he has  a history of drug abuse.  He has only received benzodiazepines, no opiates,  in the emergency room.  We will also run an [facility] report on him to see what  other medications that are narcotics that he might have been prescribed.                                             Dillan Lunden Lamona Curl, MD     [id number]  DD: 11/19/2011 11:16   DT: 11/19/2011 12:23   Job #: [id number]  CC: Glenice Bow, MD  CC: Markus Jarvis, MD

## 2011-11-19 NOTE — ED Provider Notes (Unsigned)
PATIENT NAME:                 PA #:            MR #Roy Weaver, Roy Weaver                [id number]       [id number]            EMERGENCY ROOM PHYSICIAN:                ADM DATE:                    Danae Chen, MD                   11/19/2011                   DATE OF BIRTH:   AGE:           PATIENT TYPE:     RM #:              12-07-1985       25             IPK               559I                  ++ADDENDUM++     The critical care time on this patient was 30 minutes exclusive of any  procedures and stabilizing him with IV medications.                                            Abhijay Morriss Kennith Maes, MD     [id number]  DD: 11/19/2011 06:37   DT: 11/19/2011 06:58   Job #: [id number]  CC:

## 2011-11-19 NOTE — ED Provider Notes (Unsigned)
PATIENT NAME:                 PA #:            MR #TREVYN, CALDERON                [id number]       [id number]            ATTENDING PHYSICIAN:            ADMIT DATE:     DIS DATE:             Danae Chen, MD          11/19/2011                            DATE OF BIRTH:   AGE:           PATIENT TYPE:     RM #:              07-10-1985       25             IPK               559I                  CHIEF COMPLAINT:  Seizure.     HISTORY OF PRESENT ILLNESS:  This is a 26 year old black male who had 2  seizures at home tonight and did not wake up between the 2 seizures according  to the father.  He fell out of bed.  He had been out partying and then when  he got home, he apparently went to bed but fell out of the bed and had a  seizure and then had a second seizure before the squad arrived.  He now  arrives postictal and is somnolent, and does not respond very much.     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     REVIEW OF SYSTEMS:  Could not be obtained due to his altered state.     ALLERGIES:  None.     CURRENT MEDICATIONS:  Dilantin.     SOCIAL HISTORY:  Does smoke and drink and apparently does IV narcotics.     NURSES NOTES:  Reviewed.     PHYSICAL EXAMINATION:  VITAL SIGNS:  Temperature is 100.1, pulse 91 and regular, respirations 20,  130/64 with a saturation of 94% on room air.  GENERAL:  This is a well-developed black male.  HEENT:  Head is atraumatic.  Pupils are equal, round, and react to light.  NECK:  Supple.    RESPIRATORY:  The lungs are clear.  No rales or rhonchi.  CARDIAC:  Sinus rhythm.  No murmurs or gallops.  ABDOMEN:  Soft without guarding or rebound.  SKIN:  Warm and dry.  NEUROLOGICAL:  The patient is somnolent and rouses only to pain.       He has a third seizure here in the department which would be the third  seizure of the night and he did not wake up after any of the seizures,  therefore he is now in status  epilepticus.  His labs showed a white count of  5200, hemoglobin 13.6, Dilantin less than 2.  Electrolytes:  Within normal  range except for a CO2 of 17 and the glucose is 123.  He received Ativan IV,  Keppra IV, and Dilantin IV and has had no further seizures.     DIAGNOSIS:  Status epilepticus.     Dr. Lelon Perla was notified of the findings and will admit the patient to a  telemetry bed.                                            Unice Vantassel Kennith Maes, MD     [id number]  DD: 11/19/2011 06:36   DT: 11/19/2011 07:15   Job #: [id number]  CC: Glenice Bow, MD

## 2011-11-20 LAB — URINE DRUG SCREEN

## 2011-11-20 LAB — PHENYTOIN LEVEL, TOTAL: Phenytoin Lvl: 17 ug/mL (ref 10.0–20.0)

## 2012-01-02 LAB — BASIC METABOLIC PANEL
BUN: 8 mg/dL (ref 7–18)
CO2: 26 mEq/L (ref 21–32)
Calcium: 9.2 mg/dL (ref 8.3–10.6)
Chloride: 104 mEq/L (ref 99–110)
Creatinine: 0.8 mg/dL — ABNORMAL LOW (ref 0.9–1.3)
GFR African American: >60 (ref >60)
GFR Non-African American: >60 (ref >60)
Glucose: 88 mg/dL (ref 70–99)
Potassium: 4 mEq/L (ref 3.5–5.1)
Sodium: 137 mEq/L (ref 136–145)

## 2012-01-02 LAB — CBC WITH AUTO DIFFERENTIAL
Basophils %: 0.6 %
Basophils Absolute: 0 10*3/uL (ref 0.0–0.2)
Eosinophils %: 3.8 %
Eosinophils Absolute: 0.2 10*3/uL (ref 0.0–0.6)
Hematocrit: 42.2 % (ref 40.5–52.5)
Hemoglobin: 14.1 g/dL (ref 13.5–17.5)
Lymphocytes %: 29.4 %
Lymphocytes Absolute: 1.3 10*3/uL (ref 1.0–5.1)
MCH: 29.9 pg (ref 26.0–34.0)
MCHC: 33.4 g/dL (ref 31.0–36.0)
MCV: 89.7 fL (ref 80.0–100.0)
MPV: 7.2 fL (ref 5.0–10.5)
Monocytes %: 9 %
Monocytes Absolute: 0.4 10*3/uL (ref 0.0–1.3)
Neutrophils %: 57.2 %
Neutrophils Absolute: 2.6 10*3/uL (ref 1.7–7.7)
Platelets: 274 10*3/uL (ref 135–450)
RBC: 4.7 M/uL (ref 4.20–5.90)
RDW: 13.6 % (ref 12.4–15.4)
WBC: 4.5 10*3/uL (ref 4.0–11.0)

## 2012-01-02 LAB — PHENYTOIN LEVEL, TOTAL: Phenytoin Lvl: <0.8 ug/mL — ABNORMAL LOW (ref 10.0–20.0)

## 2012-01-02 LAB — URINE DRUG SCREEN

## 2012-01-02 NOTE — ED Provider Notes (Unsigned)
PATIENT NAME:                 PA #:            MR #Roy Weaver, Roy Weaver                [id number]       [id number]            EMERGENCY ROOM PHYSICIAN:                ADM DATE:                    Thayer Headings, MD                01/02/2012                   DATE OF BIRTH:   AGE:           PATIENT TYPE:     RM #:              1985-12-22       26             ERK                                     REASON FOR VISIT:   Possible seizure.     HISTORY OF PRESENT ILLNESS:   The patient is a 26 year old who had a seizure,  has had them since March of this year.  He has been on Dilantin and actually  does well when he takes it; he has not taken it for several days.  He did not  sleep well last night.  No fever or chills, no headaches or neck pain.  No  chest pain or shortness of breath. No abdominal pain, no incontinence of  bowel or bladder.       PAST MEDICAL HISTORY:  Seizures.     MEDICATION:  Dilantin.     ALLERGIES:  No known drug allergies.     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      FAMILY HISTORY:   Noncontributory.     REVIEW OF SYSTEMS:  As above.  CONSTITUTIONAL:  No fever or chills.  HEENT:  No headaches, no ears, nose or throat pain.   NECK:  Denies neck pain.  CHEST: Denies chest pain or palpitations.  LUNGS:  No cough, congestion or shortness of breath.  ABDOMEN:  No anorexia, nausea or vomiting.   EXTREMITIES:  No myalgias.     PHYSICAL EXAMINATION:     VITAL SIGNS: Temperature 97, pulse 90, respirations 18, blood pressure 13/67,  O2 saturation is 98% on room air.    GENERAL:  She is awake, alert, well-nourished, well-developed and nontoxic.   HEENT:  Normocephalic, atraumatic. Pupils equal, round and reactive.  Ears,  nose and throat were clear.   He does have a tongue abrasion.   NECK:  Supple, nontender.   HEART:  Regular rate and rhythm.  LUNGS:  Clear.   ABDOMEN:  Soft, nondistended, nontender positive bowel  sounds.   NEUROLOGIC:  Cranial nerves 2 through 12 grossly intact.  No motor or sensory  deficits were appreciated.   The rest of  the exam was unremarkable.      LABORATORY DATA:  WBC 4.5, hemoglobin 14 and hematocrit 42, platelets 264,  electrolytes unremarkable, Dilantin level was less than 0.8.       ASSESSMENT/PLAN:  This 26 year old with seizure disorder.  We did give him a  dose of Ativan when he first came in.  He has an obvious tongue injury but  nothing that requires repair here.  I loaded him with Dilantin and instructed  him the importance of taking his medication on a regular basis and discharged  him.                                              Burr Medico, MD     [id number]  DD: 01/02/2012 20:59   DT: 01/02/2012 22:03   Job #: [id number]  CC:

## 2012-01-04 NOTE — Discharge Summary (Unsigned)
PATIENT NAME:                 PA #:            MR #Roy Weaver, Roy Weaver                [id number]       [id number]            ATTENDING PHYSICIAN:                  ADM DATE:   DIS DATE:          Brynda Greathouse, MD          11/19/2011  11/20/2011         DATE OF BIRTH:   AGE:           PATIENT TYPE:                         [birth year]       25             IPK                                      ADMISSION COMPLAINT:  Status epilepticus.     DISCHARGE DIAGNOSES AND PROBLEM LIST:  1.  Seizure disorder.  2.  Noncompliance.  3.  Tobacco and drug abuse.     HOSPITAL COURSE:  This is a 26 year old [place] American male who presented  to the emergency room after he had an episode of seizure at home.  Patient  again had 2 episodes of seizures in the emergency room.  Patient normally  takes Dilantin 300 mg, but he had been noncompliant.  Patients seizures were  1st controlled in the emergency room with Ativan and he received a loading  dose of Dilantin and he was also started on Keppra.   Neurology consult was  obtained and decision was made that patient could be continued just on his  home dose of Dilantin since patient has not been taking his medication, and  as long as patient takes his medicine, he should have control of his seizure  disorder.  Patients full history and physical examination are documented in  the chart, dictated by Dr. Renee Rival, dated November 19, 2011.       TIME SPENT:  More than 30 minutes was spent in preparation of the paperwork  and patient education.  All the questions were invited and answered.       DISPOSITION:  Patient is being discharged home.     DISCHARGE CONDITION:  Stable.     DISCHARGE MEDICATIONS:  Please see the list.     DIET:  Regular.     ACTIVITY:  As tolerated.     FOLLOWUP:  Patient is to follow with the primary care physician in 1 to 2  weeks.  Patients Dilantin level was checked in the emergency room on  presentation and that came out to be very low.   Patient did not have any  further seizures on the floor.  [facility] Modena Morrow, MD     [id number]  DD: 01/04/2012 23:14   DT: 01/05/2012 09:30   Job #: [id number]  CC: Glenice Bow, MD  CC: Reece Agar, MD

## 2012-04-17 ENCOUNTER — Inpatient Hospital Stay: Admit: 2012-04-17 | Discharge: 2012-04-17 | Attending: Emergency Medicine

## 2012-04-17 LAB — COMPREHENSIVE METABOLIC PANEL
ALT: 13 U/L (ref 10–40)
AST: 30 U/L (ref 15–37)
Albumin/Globulin Ratio: 1.3 (ref 1.1–2.2)
Albumin: 4.7 g/dL (ref 3.4–5.0)
Alkaline Phosphatase: 105 U/L (ref 40–129)
BUN: 11 mg/dL (ref 7–20)
CO2: 29 mmol/L (ref 21–32)
Calcium: 9.6 mg/dL (ref 8.3–10.6)
Chloride: 93 mmol/L — ABNORMAL LOW (ref 99–110)
Creatinine: 0.8 mg/dL (ref 0.8–1.3)
GFR African American: >60 (ref >60)
GFR Non-African American: >60 (ref >60)
Globulin: 4 g/dL
Glucose: 118 mg/dL — ABNORMAL HIGH (ref 70–99)
Potassium: 4.2 mmol/L (ref 3.5–5.1)
Sodium: 135 mmol/L — ABNORMAL LOW (ref 136–145)
Total Bilirubin: 0.4 mg/dL (ref 0.0–1.0)
Total Protein: 8.3 g/dL — ABNORMAL HIGH (ref 6.4–8.2)

## 2012-04-17 LAB — PHENYTOIN LEVEL, TOTAL: Phenytoin Lvl: <0.8 ug/mL — ABNORMAL LOW (ref 10.0–20.0)

## 2012-04-17 MED ADMIN — 0.9 % sodium chloride bolus: 1000 mL | INTRAVENOUS | @ 15:00:00 | NDC 00338004904

## 2012-04-17 MED ADMIN — LORazepam (ATIVAN) injection 1 mg: INTRAVENOUS | @ 15:00:00 | NDC 00409677802

## 2012-04-17 MED ADMIN — fosphenytoin (CEREBYX) 1,000 mg PE in dextrose 5 % 50 mL IVPB (loading dose): 1000 mg | INTRAVENOUS | @ 17:00:00 | NDC 63323040302

## 2012-04-17 MED FILL — LORAZEPAM 2 MG/ML IJ SOLN: 2 MG/ML | INTRAMUSCULAR | Qty: 1

## 2012-04-17 MED FILL — PHENYTOIN SODIUM 50 MG/ML IJ SOLN: 50 MG/ML | INTRAMUSCULAR | Qty: 20

## 2012-04-17 MED FILL — FOSPHENYTOIN SODIUM 100 MG PE/2ML IJ SOLN: 100 MG PE/2ML | INTRAMUSCULAR | Qty: 20

## 2012-04-17 MED FILL — SODIUM CHLORIDE 0.9 % IV SOLN: 0.9 % | INTRAVENOUS | Qty: 1000

## 2012-04-17 NOTE — ED Notes (Signed)
Bed:B-33<BR> Expected date:04/17/12<BR> Expected time: 9:45 AM<BR> Means of arrival:[facility]<BR> Comments:<BR> #[phone]

## 2012-04-17 NOTE — ED Notes (Signed)
Seizure pads placed on bed per order.    Weyman Pedro, RN  04/17/12 1155

## 2012-04-17 NOTE — Discharge Instructions (Signed)
TAKE DILANTIN AS prescribed  Driving and Equipment Restrictions  Some medical problems make it dangerous to drive, ride a bike, or use machines. Some of these problems are:   A hard blow to the head (concussion).   Passing out (fainting).   Twitching and shaking (seizures).   Low blood sugar.   Taking medicine to help you relax (sedatives).   Taking pain medicines.   Wearing an eye patch.   Wearing splints. This can make it hard to use parts of your body that you need to drive safely.  HOME CARE    Do not drive until your doctor says it is okay.   Do not use machines until your doctor says it is okay.  You may need a form signed by your doctor (medical release) before you can drive again. You may also need this form before you do other tasks where you need to be fully alert.  MAKE SURE YOU:   Understand these instructions.   Will watch your condition.   Will get help right away if you are not doing well or get worse.  Document Released: 06/17/2004 Document Revised: 08/02/2011 Document Reviewed: 09/17/2009  [facility] Patient Information 2013 [place], [place].    Seizures  You had a seizure. About 2% of the population will have a seizure problem during their life. Sometimes the cause for the seizure is not known. Seizures are most often associated with one of these problems:   Epilepsy.    Not taking your seizure medicine.    Alcohol and drug abuse.    Head injury, strokes, tumors, and brain surgery.    High fever and infections.    Low blood sugar.   Evaluating a new seizure disorder may require having a brain scan or an EEG (brain wave test). If you have been given a seizure medicine, it is very important that you take it as prescribed. Not taking these medicines as directed is the most common cause of seizures. Blood tests are often used to be sure you are taking the proper dose.   Seizures cause many different symptoms, from convulsions to brief blackouts. Please do not ride a bike, drive a car,  go swimming, climb in high or dangerous places such as ladders or roofs, or operate any dangerous equipment until you have your doctor's permission. If you hold a driver's license, state law may require that a report be made to the motor vehicles department. You should wear an emergency medical identification bracelet with information about your seizures. If you have any warning that a seizure may occur, lie down in a safe place to protect yourself.  Teach your family and friends what to do if you have any further seizures. They should stay calm and try to keep you from falling on hard or sharp objects. It is best not to try to restrain a seizing person or to force anything into their mouth. Do not try to open clenched jaws. When the seizure is over, the person should be rolled on their side to help drain any vomit or secretions from the mouth. After a seizure the person may be confused or drowsy for several minutes and they can be reassured. An ambulance should be called if the seizure lasted more than 5 minutes or if confusion remains for more than 30 minutes. Call your caregiver or the emergency department for further instructions.  Do not drive until cleared by your caregiver or neurologist!  Document Released: 06/17/2004 Document Re-Released: 02/17/2008  ExitCare  Patient Information 2012 [place], [place].    [facility]  [facility]  [address] [address]  [phone]  Fax [phone]  Medical, OB/Gyn, Pediatrics, WIC  Serves all of [facility]  [address]  [place], [place] [address]  [phone]       [facility]  [address] (Administrative offices)  [phone]  Homeless only [facility])  [address] [place], [place] [address]  [phone] or [phone], Spanish [phone],   Dental Appointments [phone] or  [phone]  Pediatric, Family Practice, X-Ray  Serves all of [facility] ([facility])  [address]  [place], [place] [address]  [phone]   Health Care Connection [phone]. Healthy)   [address]    (Located in [place])  [phone] or [phone], Spanish [phone],   Dental Appointments [phone] or [phone]     [facility]  [address] [place], [place] [address]  [phone] [facility]  [address]  [phone]   [facility]  [address]  [address]  [phone] Fax [phone]  General Practice    [facility] and Surrounding areas [facility]  [address] [address]  [phone] Fax [phone]  Medical, OB/Gyn, Pediatrics  Dental Clinic, [facility] limits only     [facility]  [address] [address]  [phone] Fax [phone]  [facility], Pediatrics, [place], [facility]  Dental Clinic [phone]  [facility] area   [facility]  [address]    [phone]  Urgent Care, Open 24 hrs, Urgent care, Gyn, Prenatal, Dental Mental, Translators     [facility]   [address] [place], [place] [address] [phone]  (Located: [facility])  Pediatrics [phone], Spanish [phone],   Dental Appointments [phone] or [phone]  [facility] [phone] [facility]  [address] [place] [address]  [phone]  Medical, OB/Gyn, Pediatrics, [facility]  Dental Clinic [phone]       [facility]  [address] [place], [place]  [address]  [phone] Fax [phone]  Pediatrics, [facility]   [facility]  [address] [address]  [phone]   Fax [phone]  Pediatrics, [facility]     [facility]  [address] [address]  [phone]  Medical Clinic   [facility]  [address] [address]  [phone]  Pediatrics, Internal Med,  [place], [facility], Dental Clinic [phone]     [facility]  [address]  [place] [address]   [phone] [facility]  [address] [address]  [phone] Fax [phone]  General Medical Clinic  Sliding scale fee  All [place] area     HOSPITAL CLINICS    [facility]  [address] [facility]  [place], [place] [address]  [phone]     [facility]  [address]  [place], [place] [address]  [phone]    [facility]   [address]  [place], [place] [address]  [phone]         [facility] Medical Subspecialties  [address]  [place], [place] [address]  [facility]  [phone]  Fax:  [phone]  [facility] Internal Medicine and Pediatrics  [phone]  Fax:  [phone]  General Medical Clinic  Resident Practice  [phone]  Fax:  [phone]  Medical Specialty Center  [phone]  Fax:  [phone]  Orthopaedics  [phone]     [place] [facility] Source of [place])  [address]  [place], [place] [address]  [phone]    [place] (Continued)    [facility]   [facility] Source of [place])  [address]  [place], [place] [address]  [phone]   [facility] [place]   (formerly [facility])   [address]  [place], [place] [address]  [phone]  [phone] OB/GYN Services   [facility]   [facility] Source of [place])  [address]  [place], [place] [address]  [phone]       [facility]  [facility] Source of [place])  [phone] [place]. [address] [place] [address]  [phone] [facility]    [facility]  (Health Source of [place])  [address] [place], [place] [address]  [phone]     [facility]  [facility] Source of [place])  [address] [place], [place] [address]  [phone]   [facility]    [place]  [facility] (Health Source of [place])  [address] [place], [place] [address]  [phone]    [facility]  [facility]  [address], [place], [place] [address]  [phone]  General Family Practice, Pediatrics  Services all areas sliding scale fee     [facility]  [facility]  [address] [place], [place]  [address]  [phone]  General Medical Clinic, Pediatrics, [facility]   [facility]   ([facility])  [address]  [place], [place] [address]  [phone]   [facility] [employer] [facility])  [address]  [place], [place] [address]  [phone]     [place]  [facility] (Health Source of [place])  [address]   [facility] [place]  [phone]  Gyn, Prenatal, Dental, Mental, Translators [place]  [facility].  [place]   [phone]               Seizures  You had a seizure. About 2% of the population will have a seizure problem during their life. Sometimes the cause for the seizure is not known. Seizures are most often associated with one of these problems:   Epilepsy.    Not taking your seizure medicine.    Alcohol and drug abuse.    Head injury, strokes, tumors, and brain surgery.    High fever and infections.    Low blood sugar.   Evaluating a new seizure disorder may require having a brain scan or an EEG (brain wave test). If you have been given a seizure medicine, it is very important that you take it as prescribed. Not taking these medicines as directed is the most common cause of seizures. Blood tests are often used to be sure you are taking the proper dose.   Seizures cause many different symptoms, from convulsions to brief blackouts. Please do not ride a bike, drive a car, go swimming, climb in high or dangerous places such as ladders or roofs, or operate any dangerous equipment until you have your doctor's permission. If  you hold a  driver's license, state law may require that a report be made to the motor vehicles department. You should wear an emergency medical identification bracelet with information about your seizures. If you have any warning that a seizure may occur, lie down in a safe place to protect yourself.  Teach your family and friends what to do if you have any further seizures. They should stay calm and try to keep you from falling on hard or sharp objects. It is best not to try to restrain a seizing person or to force anything into their mouth. Do not try to open clenched jaws. When the seizure is over, the person should be rolled on their side to help drain any vomit or secretions from the mouth. After a seizure the person may be confused or drowsy for several minutes and they can be reassured. An ambulance should be called if the seizure lasted more than 5 minutes or if confusion remains for more than 30 minutes. Call your caregiver or the emergency department for further instructions.  Do not drive until cleared by your caregiver or neurologist!  Document Released: 06/17/2004 Document Re-Released: 02/17/2008  [facility] Patient Information 2012 [place], [place].

## 2012-04-17 NOTE — ED Notes (Signed)
Dr. Robb Matar @ bedside. Pt denies any drug or alcohol use.    Macarthur Critchley, RN  04/17/12 [phone]

## 2012-04-17 NOTE — ED Provider Notes (Signed)
[facility]  eMERGENCY dEPARTMENT eNCOUnter      Pt Name: Roy Weaver  MRN: [id number]  Birthdate 11-11-1985  Date of evaluation: 04/17/2012  Provider: Gala Lewandowsky, NP    CHIEF COMPLAINT       Chief Complaint   Patient presents with   ??? Seizures       CRITICAL CARE TIME   Total Critical Care time was 0 minutes, excluding separately reportable procedures.  There was a high probability of clinically significant/life threatening deterioration in the patient's condition which required my urgent intervention.  Not applicable.       HISTORY OF PRESENT ILLNESS  (Location/Symptom, Timing/Onset, Context/Setting, Quality, Duration, Modifying Factors, Severity.)   Roy Weaver is a 26 y.o. male who presents to the emergency department via EMS from home after a 911 call was placed for a seizure. Patient's mother reports that she heard the patient hit the floor she went into the room and he was on the floor unconscious and shaking all over. He also reports that the patient is noncompliant with his Dilantin. Patient does have a history of seizures. His last seizure was last month her the patient. However the mother reports his last known seizure to her was in July.  On arrival to the emergency department patient was awake but somewhat lethargic following commands. He was nontoxic in appearance.   noted to be well-developed well-nourished. Seemed to be somewhat postictal. No evidence of tongue trauma, head,neck,  torso or extremity trauma no evidence of incontinence.   He was noted to have a GCS of 15. He was placed in a knee on ED stretcher and seizure precautions put a place, and peripheral IV established.  Patient denied any illicit drug use. He also denies any pain. He denies any recent illness or adjustment in his medication.      Nursing Notes were reviewed and agreed with or any disagreements were addressed in the HPI.    REVIEW OF SYSTEMS    (2-9 systems for level 4, 10 or more for  level 5)     Review of Systems   Constitutional: Negative.    HENT: Negative.    Eyes: Negative.    Respiratory: Negative.    Cardiovascular: Negative.    Gastrointestinal: Negative.    Genitourinary: Negative.    Neurological: Positive for seizures.   Psychiatric/Behavioral: Negative.          Except as noted above the remainder of the review of systems was reviewed and negative.       PAST MEDICAL HISTORY         Diagnosis Date   ??? Seizures        SURGICAL HISTORY     No past surgical history on file.    CURRENT MEDICATIONS       Previous Medications    PHENYTOIN (DILANTIN) 300 MG ER CAPSULE    Take 300 mg by mouth daily.       ALLERGIES     Review of patient's allergies indicates no known allergies.    FAMILY HISTORY     No family history on file.  No family status information on file.        SOCIAL HISTORY          PHYSICAL EXAM    (up to 7 for level 4, 8 or more for level 5)   ED Triage Vitals   Enc Vitals Group      BP 04/17/12 0959 134/87 mmHg  Pulse 04/17/12 0959 87      Resp 04/17/12 0959 12      Temp 04/17/12 0959 98.2 ??F (36.8 ??C)      Temp Source 04/17/12 0959 Oral      SpO2 04/17/12 0959 97 %      Weight --       Height --       Head Cir --       Peak Flow --       Pain Score --       Pain Loc --       Pain Edu? --       Excl. in GC? --        Physical Exam   Constitutional: He is oriented to person, place, and time. He appears well-developed and well-nourished.   HENT:   Head: Normocephalic and atraumatic.   Nose: Nose normal.   Mouth/Throat: Oropharynx is clear and moist. No oropharyngeal exudate.   Eyes: EOM are normal. Pupils are equal, round, and reactive to light. Right eye exhibits no discharge. Left eye exhibits no discharge. No scleral icterus.   Neck: Normal range of motion. Neck supple. No JVD present. No tracheal deviation present. No thyromegaly present.   Cardiovascular: Normal rate, regular rhythm and intact distal pulses.  Exam reveals no gallop and no friction rub.    No murmur  heard.  Pulmonary/Chest: Effort normal and breath sounds normal. No respiratory distress. He has no wheezes. He has no rales. He exhibits no tenderness.   Abdominal: Soft. Bowel sounds are normal.   Musculoskeletal: Normal range of motion. He exhibits no edema and no tenderness.   Lymphadenopathy:     He has no cervical adenopathy.   Neurological: He is alert and oriented to person, place, and time. He has normal reflexes. No cranial nerve deficit. He exhibits normal muscle tone.   Skin: Skin is warm and dry. He is not diaphoretic.   Psychiatric:   Flat affect,            DIAGNOSTIC RESULTS     EKG: All EKG's are interpreted by the Emergency Department Physician who either signs or Co-signs this chart in the absence of a cardiologist.        RADIOLOGY:   Non-plain film images such as CT, Ultrasound and MRI are read by the radiologist. Plain radiographic images are visualized and preliminarily interpreted by the emergency physician with the below findings:        Interpretation per the Radiologist below, if available at the time of this note:           LABS:  Labs Reviewed   PHENYTOIN LEVEL, TOTAL - Abnormal; Notable for the following:     Phenytoin Lvl <0.8 (*)     All other components within normal limits   COMPREHENSIVE METABOLIC PANEL - Abnormal; Notable for the following:     Sodium 135 (*)     Chloride 93 (*)     Glucose 118 (*)     Total Protein 8.3 (*)     All other components within normal limits       All other labs were within normal range or not returned as of this dictation.    EMERGENCY DEPARTMENT COURSE and DIFFERENTIAL DIAGNOSIS/MDM:   Vitals:    Filed Vitals:    04/17/12 0959   BP: 134/87   Pulse: 87   Temp: 98.2 ??F (36.8 ??C)   TempSrc: Oral   Resp: 12  SpO2: 97%       Patient was evaluated by myself and Dr. Robb Matar. Patient was monitored for seizure activity throughout the ED course. He did receive Ativan IV on arrival to due to a known witnessed seizure activity that  seems to be consistent with  grand mal type probably induced by noncompliance with his Dilantin.   Dilantin level was subtherapeutic per laboratories result. Patient was given 1 g of Dilantin in the ED IV. There was no seizure activity noted in the ED. He she will be discharged home in the care of his mother with instructions to followup and referral to [facility] to establish primary care, [facility] neurology as well as [facility] neurolog[facility] clinic. All instructions were given to resume daily Dilantin dose as prescribed.    CONSULTS:  None    PROCEDURES:  None    FINAL IMPRESSION      1. Breakthrough seizure    2. Subtherapeutic serum dilantin level          DISPOSITION/PLAN   DISPOSITION     PATIENT REFERRED TO:  [facility]  [address] [place]yn Sago  [place] [address]  [phone]  Schedule an appointment as soon as possible for a visit in 1 day      [facility]  [address] [facility] [address]  [place] [address]  [phone]          [facility]  DENTAL [phone]  [address] [place] [address]  [phone]    Call in 3 days        DISCHARGE MEDICATIONS:  New Prescriptions    No medications on file       (Please note that portions of this note were completed with a voice recognition program.  Efforts were made to edit the dictations but occasionally words are mis-transcribed.)    Delinda Dory [place], NP          Edman Circle, NP  04/17/12 1124    Fanny Skates, MD  04/17/12 [phone]

## 2012-04-17 NOTE — ED Notes (Signed)
fosphyntoin infusion complete. Mom at bedside. Call light in reach.    Weyman Pedro, RN  04/17/12 1350

## 2012-04-17 NOTE — ED Notes (Signed)
Mother @ bedside. Sts 2 seizures this am, after first one pt was up walking around, so she went back to bed. Then she heard him fall out of bed for the second seizure. Sts the second seizure lasted 2 minutes.    Macarthur Critchley, RN  04/17/12 1028

## 2012-04-17 NOTE — ED Notes (Signed)
Pt presented to ER via squad. 911 called for seizure activity that was witnessed by a family member at home. No seizure activity en route. NP @ bedside for eval. Squad sts pt non compliant with his seizure medication. Pt sts that he took it yesterday.    Macarthur Critchley, RN  04/17/12 1004

## 2012-04-17 NOTE — ED Provider Notes (Signed)
.    Roy Weaver is a 26 y.o. male who presents with a complaint of   Chief Complaint   Patient presents with   ??? Seizures   does not recall the seizure. He told me that it occurred while he was laying down.    On my exam:  Vital Signs: BP 134/87   Pulse 87   Temp(Src) 98.2 ??F (36.8 ??C) (Oral)   Resp 12   SpO2 97%  General: Patient appears non-toxic  Awake alert, no slurred speech    This patient was seen by the mid-level provider.  I have seen and examined the patient face to face, agree with the workup, evaluation, management and diagnosis.  Care plan has been discussed.      Electronically signed by: Fanny Skates, MD, 04/17/2012  10:12 AM      Fanny Skates, MD  04/17/12 1013

## 2012-05-14 NOTE — ED Provider Notes (Signed)
[facility] EMERGENCY DEPT  eMERGENCY dEPARTMENT eNCOUnter      Pt Name: Roy Weaver  MRN: [id number]  [place] [birth year]  Date of evaluation: 05/14/2012  Provider: Jeral Fruit, [place]       Chief Complaint   Patient presents with   ??? Seizures       CRITICAL CARE TIME   Total Critical Care time was  minutes, excluding separately reportable procedures.  There was a high probability of clinically significant/life threatening deterioration in the patient's condition which required my urgent intervention.        HISTORY OF PRESENT ILLNESS  (Location/Symptom, Timing/Onset, Context/Setting, Quality, Duration, Modifying Factors, Severity.)   Roy Weaver is a 26 y.o. male who presents to the emergency department with a seizure. Patient has a known seizure disorder. He is apparently noncompliant with his seizure medication. Father states he was out last night all night long he is concerned there may be some drug use and abuse. There is a history of drug use and abuse. Today he came home made a plate of food went to his room. And he found him having a seizure today. He did bite his tongue. There is no other apparent injury. Other denies recent known illness, recent headache, recent neck stiffness, rash, runny nose sore throat cough sputum production abdominal pain nausea vomiting diarrhea or other recent infection. He does have a low-grade fever on presentation. On initial evaluation he was post ictal but did know he was at the hospital and that he had a seizure.       Nursing Notes were reviewed.    REVIEW OF SYSTEMS    (2-9 systems for level 4, 10 or more for level 5)     Review of Systems   Constitutional: Positive for fever. Negative for chills.   HENT: Negative for neck pain and neck stiffness.    Respiratory: Negative for cough and shortness of breath.    Cardiovascular: Negative for chest pain.   Gastrointestinal: Negative for nausea, vomiting and abdominal pain.   Musculoskeletal: Negative  for arthralgias.   Skin: Negative for rash.   Neurological: Positive for seizures. Negative for headaches.   All other systems reviewed and are negative.          Except as noted above the remainder of the review of systems was reviewed and negative.       PAST MEDICAL HISTORY         Diagnosis Date   ??? Seizures        SURGICAL HISTORY     No past surgical history on file.    CURRENT MEDICATIONS       Previous Medications    PHENYTOIN (DILANTIN) 300 MG ER CAPSULE    Take 300 mg by mouth daily.       ALLERGIES     Review of patient's allergies indicates no known allergies.    FAMILY HISTORY     No family history on file.  No family status information on file.        SOCIAL HISTORY          PHYSICAL EXAM    (up to 7 for level 4, 8 or more for level 5)   ED Triage Vitals   BP Temp Temp Source Pulse Resp SpO2 Height Weight   05/14/12 2134 05/14/12 2134 05/14/12 2134 05/14/12 2134 05/14/12 2134 05/14/12 2134 05/14/12 2134 05/14/12 2134   133/74 mmHg 100.5 ??F (38.1 ??C) Oral 94  22 94 % 6' 2"$  (1.88 m) 160 lb (72.576 kg)       Physical Exam   Nursing note and vitals reviewed.  Constitutional: He is oriented to person, place, and time. He appears well-developed and well-nourished.   HENT:   Head: Normocephalic and atraumatic.   Right Ear: External ear normal.   Left Ear: External ear normal.   Eyes: Conjunctivae and EOM are normal. Pupils are equal, round, and reactive to light. Right eye exhibits no discharge. Left eye exhibits no discharge.   Neck: Normal range of motion. Neck supple.   Cardiovascular: Normal rate, regular rhythm, normal heart sounds and intact distal pulses.    Pulmonary/Chest: Effort normal and breath sounds normal. No respiratory distress.   Abdominal: Soft. Bowel sounds are normal.   Musculoskeletal: Normal range of motion.   Lymphadenopathy:     He has no cervical adenopathy.   Neurological: He is alert and oriented to person, place, and time.   Patient was initially slightly sleepy. However he  responded to verbal stimuli answered questions appropriately and did not demonstrate any focal neurologic deficit. He denied headache or other injury. He denies feeling ill recently. After his seizure he was post ictal and sedated from Ativan. However he remained nonfocal negative Kernig emergency signs were noted.   Skin: Skin is warm and dry. He is not diaphoretic.   Psychiatric: He has a normal mood and affect. His behavior is normal.           DIAGNOSTIC RESULTS     EKG: All EKG's are interpreted by the Emergency Department Physician who either signs or Co-signs this chart in the absence of a cardiologist.        RADIOLOGY:   Non-plain film images such as CT, Ultrasound and MRI are read by the radiologist. Plain radiographic images are visualized and preliminarily interpreted by the emergency physician with the below findings:    CT head radiology interpretation no acute disease, chest x-ray my interpretation possible right lower lobe infiltrate most likely no acute disease.    Interpretation per the Radiologist below, if available at the time of this note:    CT HEAD [place]    (Results Pending)   XR CHEST PORTABLE    (Results Pending)         ED BEDSIDE ULTRASOUND:   Performed by ED Physician - none    LABS:  Labs Reviewed   CBC WITH AUTO DIFFERENTIAL - Abnormal; Notable for the following:     Lymphocytes Absolute 0.9 (*)     All other components within normal limits   COMPREHENSIVE METABOLIC PANEL - Abnormal; Notable for the following:     Sodium 135 (*)     Chloride 97 (*)     Glucose 105 (*)     All other components within normal limits   PHENYTOIN LEVEL, TOTAL - Abnormal; Notable for the following:     Phenytoin Lvl <0.8 (*)     All other components within normal limits   CULTURE BLOOD #1   URINE DRUG SCREEN   URINE RT REFLEX TO CULTURE       All other labs were within normal range or not returned as of this dictation.    EMERGENCY DEPARTMENT COURSE and DIFFERENTIAL DIAGNOSIS/MDM:   Vitals:    Filed  Vitals:    05/14/12 2134 05/14/12 2231 05/14/12 2300   BP: 133/74 116/63    Pulse: 94 97 88   Temp: 100.5 ??F (38.1 ??C)  TempSrc: Oral     Resp: 22 20 15   $ Height: 6' 2"$  (1.88 m)     Weight: 160 lb (72.576 kg)     SpO2: 94% 100% 100%       Patient had initial clearing of sensorium part to his second seizure. He's had no further seizure after the Ativan is given here. He does have mild mucosal injury to his lips. No other injury is noted. He may have an early pneumonia on his chest x-ray. Will be given Rocephin here will continue him on Zithromax as an outpatient. I detailed discussion with the father regarding his need to be compliant with his medications father was very helpful with giving [place] history expressed understanding of plan and seemed very appropriate helpful. They will the patient until sensorium is clear prior to discharge he is going to be given 1 g Dilantin here as well.    CONSULTS:  None    PROCEDURES:  None    FINAL IMPRESSION      1. Breakthrough seizure    2. H/O noncompliance with medical treatment, presenting hazards to health    3. History of drug abuse    4. Fever          DISPOSITION/PLAN   DISPOSITION     PATIENT REFERRED TO:    Clinic list in discharge plan    Return to emergency room for increasing seizures worsening fever confusion      DISCHARGE MEDICATIONS:  New Prescriptions    No medications on file       (Please note that portions of this note were completed with a voice recognition program.  Efforts were made to edit the dictations but occasionally words are mis-transcribed.)    Jeral Fruit, DO  Attending Emergency Physician        Jeral Fruit, DO  05/15/12 [phone]

## 2012-05-14 NOTE — ED Notes (Signed)
Tourniquet placed distal to puncture site.  Area cleansed with chlorhexidine and allowed to air dry.  Angio inserted with good blood return.  Lock placed on angio hub and secured.  Blood drawn and labeled with pts information.  IV flushed with normal saline; no resistance or pain to the pt.  Blood was sent to the lab.  Pt tolerated procedure well.    Wyline Copas, EMT-P  05/14/12 2211

## 2012-05-14 NOTE — ED Notes (Signed)
Pt. Actively seizing. MD aware. Meds given. MD in room to eval patient    Cheryl Flash, RN  [id number]

## 2012-05-14 NOTE — ED Notes (Signed)
Bed:B-07<BR> Expected date:<BR> Expected time:<BR> Means of arrival:<BR> Comments:<BR> -seizures

## 2012-05-15 ENCOUNTER — Inpatient Hospital Stay: Admit: 2012-05-15 | Discharge: 2012-05-15 | Attending: Emergency Medicine

## 2012-05-15 LAB — PHENYTOIN LEVEL, TOTAL: Phenytoin Lvl: <0.8 ug/mL — ABNORMAL LOW (ref 10.0–20.0)

## 2012-05-15 LAB — COMPREHENSIVE METABOLIC PANEL
ALT: 11 U/L (ref 10–40)
AST: 21 U/L (ref 15–37)
Albumin/Globulin Ratio: 1.3 (ref 1.1–2.2)
Albumin: 4.2 g/dL (ref 3.4–5.0)
Alkaline Phosphatase: 93 U/L (ref 40–129)
BUN: 9 mg/dL (ref 7–20)
CO2: 25 mmol/L (ref 21–32)
Calcium: 8.9 mg/dL (ref 8.3–10.6)
Chloride: 97 mmol/L — ABNORMAL LOW (ref 99–110)
Creatinine: 0.9 mg/dL (ref 0.8–1.3)
GFR African American: >60 (ref >60)
GFR Non-African American: >60 (ref >60)
Globulin: 3.3 g/dL
Glucose: 105 mg/dL — ABNORMAL HIGH (ref 70–99)
Potassium: 4 mmol/L (ref 3.5–5.1)
Sodium: 135 mmol/L — ABNORMAL LOW (ref 136–145)
Total Bilirubin: 0.5 mg/dL (ref 0.0–1.0)
Total Protein: 7.5 g/dL (ref 6.4–8.2)

## 2012-05-15 LAB — CBC WITH AUTO DIFFERENTIAL
Basophils %: 0.4 %
Basophils Absolute: 0 10*3/uL (ref 0.0–0.2)
Eosinophils %: 2.2 %
Eosinophils Absolute: 0.1 10*3/uL (ref 0.0–0.6)
Hematocrit: 40.8 % (ref 40.5–52.5)
Hemoglobin: 13.6 g/dL (ref 13.5–17.5)
Lymphocytes %: 14.2 %
Lymphocytes Absolute: 0.9 10*3/uL — ABNORMAL LOW (ref 1.0–5.1)
MCH: 30.1 pg (ref 26.0–34.0)
MCHC: 33.5 g/dL (ref 31.0–36.0)
MCV: 89.8 fL (ref 80.0–100.0)
MPV: 7.4 fL (ref 5.0–10.5)
Monocytes %: 7.7 %
Monocytes Absolute: 0.5 10*3/uL (ref 0.0–1.3)
Neutrophils %: 75.5 %
Neutrophils Absolute: 4.9 10*3/uL (ref 1.7–7.7)
Platelets: 198 10*3/uL (ref 135–450)
RBC: 4.54 M/uL (ref 4.20–5.90)
RDW: 13.5 % (ref 12.4–15.4)
WBC: 6.4 10*3/uL (ref 4.0–11.0)

## 2012-05-15 MED ORDER — PHENYTOIN SODIUM EXTENDED 100 MG PO CAPS
100 MG | ORAL_CAPSULE | Freq: Every day | ORAL | Status: DC
Start: 2012-05-15 — End: 2012-11-27

## 2012-05-15 MED ADMIN — phenytoin (DILANTIN) 1,000 mg in sodium chloride 0.9 % 100 mL IVPB (loading dose): 1000 mg | INTRAVENOUS | @ 07:00:00 | NDC 00641255541

## 2012-05-15 MED ADMIN — LORazepam (ATIVAN) 2 MG/ML injection: INTRAVENOUS | @ 03:00:00 | NDC 00641604401

## 2012-05-15 MED ADMIN — cefTRIAXone (ROCEPHIN) 2 g IVPB in D5W 50ml minibag: 2 g | INTRAVENOUS | @ 06:00:00 | NDC 00409733503

## 2012-05-15 MED FILL — LORAZEPAM 2 MG/ML IJ SOLN: 2 MG/ML | INTRAMUSCULAR | Qty: 1

## 2012-05-15 MED FILL — CEFTRIAXONE SODIUM 2 G IJ SOLR: 2 g | INTRAMUSCULAR | Qty: 2

## 2012-05-15 MED FILL — PHENYTOIN SODIUM 50 MG/ML IJ SOLN: 50 MG/ML | INTRAMUSCULAR | Qty: 20

## 2012-05-15 NOTE — ED Notes (Signed)
Radiological discrepancy    CT of the head was obtained and interpreted by radiology as normal intracranial film. 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 They recommended further followup with ENT.     I called the patient and left a message to return my phone call to speak about a CT results at 1053 am.      Judeth Horn, PA  05/15/12 1106        Addendum  1:58 PM 05/16/12  I have attempted without success to contact this patient by phone to discuss the CT results.             Judeth Horn, [place]  05/16/12 1359

## 2012-05-15 NOTE — Discharge Instructions (Signed)
Seizure, Adult  A seizure is abnormal electrical activity in the brain. Seizures can cause a change in attention or behavior (altered mental status). Seizures often involve uncontrollable shaking (convulsions). Seizures usually last from 30 seconds to 2 minutes. Epilepsy is a brain disorder in which a patient has repeated seizures over time.  CAUSES   There are many different problems that can cause seizures. In some cases, no cause is found. Common causes of seizures include:   Head injuries.   Brain tumors.   Infections.   Imbalance of chemicals in the blood.   Kidney failure or liver failure.   Heart disease.   Drug abuse.   Stroke.  [place] [facility]. [facility]  [address]. [place]  Fax [phone]  Medical, OB/Gyn, Pediatrics, [place]  Serves all of [facility] Healthy Beginnings ([place] [place])  [place]  [place], [place] [address]  [place]  [address]. (Administrative offices)  [phone]  Homeless only [facility])  [address] [place] [address]  [phone] or [phone], [place],   Dental Appointments [phone] or [phone]  Pediatric, Family Practice, X-Ray  Serves all of [facility])  [place].  [place], [place] [address]  [phone]   [place] [phone]. Healthy)   [address]    (Located in [place])  [phone] or [phone], [place] [phone],   Dental Appointments [phone] or [phone]     [facility]  [place], [place] [address]  [place]  [facility]  [place].  [place] Fax [place] and Surrounding areas [facility]  [address].  [place] Fax [phone]  Medical, OB/Gyn, Pediatrics  Dental Clinic, [facility] limits only     [facility]  [place]  [phone] Fax [phone]  [facility], Pediatrics, [place], [facility] [place] area   [facility]  [address]    [phone]  Urgent Care, Open 24 hrs, Urgent care, Gyn, Prenatal, Dental Mental, [place] [address] [phone]  (Located: [place])  Pediatrics [phone], [place],   Dental Appointments [phone] or [phone]  [facility]  [place]. [place], [place], Pediatrics, [facility] [place]  [facility]  [address], [place]  [address]  [phone] Fax [phone]  Pediatrics, [facility]   [place]  [id number]  [phone]   Fax [phone]  Pediatrics, [place]. [place]  [facility]   [facility]  [address]  [phone]  Pediatrics, Internal Med, [place], [facility], Dental Clinic [place]  [address]  [place] [address]   [phone] [facility]  [place]  [phone] Fax [phone]  General Medical  Clinic  Sliding scale fee  All [place] area     [facility]  [address]  [place], [place] [address]  [facility]  [place], [place] [address]  [facility]   [address].  [place], [place] [address]  [facility] [place]  [address]  [place], [place] [address]  [facility]  [phone]  Fax:   [phone]  [facility] Internal Medicine and Pediatrics  [phone]  Fax:  [phone]  [facility]  Resident Practice  [phone]  Fax:  [place]  [phone]  Fax:  [phone]  Orthopaedics  [phone]     [place] ([place])  [address]  [place], [place] [id number]  [phone]    Cherlynn Perches (Continued)    [facility]   [facility] Source of [place])  [address] [id number]  [place], [place] [address]  [place] of [place]   ([facility])   [address] route [place], [place] [address]  [phone]  [phone] [place]   [facility] Source of [place])  [address] [place] [address] [place], [place] [address]  [phone]       [place]. [facility]  ([place])  [place]. [address], [place] [address]  [place]  (Health Source of [place])  [place]. [address] [place], [place] [address]  [place]  ([place] of [place])  [place], [place] [address]  [phone]   [place] ([place] of [place])  [place], [place] [address]  [phone]    [place], [place], [place] [address]  [phone]  General Family Practice, Pediatrics  Services all areas sliding scale fee     [place] Department  [address] [place]  [address]  [facility], Pediatrics, [place]   (formerly [facility])  [place], [place] [address]  [place] (formerly [place])  [address]  [place],  [address]  [place]  [facility] ([place] of [place])  [place].   [place]un[place]y [facility]  [id number]  Gyn, Prenatal, Dental, Mental, [place]   [phone]            Withdrawal from certain drugs or alcohol.   Birth defects.   Malfunction of a neurosurgical device placed in the brain.  SYMPTOMS   Symptoms vary depending on the part of the brain that is involved. Right before a seizure, you may have a warning (aura) that a seizure is about to occur. An aura may include the following symptoms:    Fear or anxiety.   Nausea.   Feeling like the room is spinning (vertigo).   Vision changes, such  as seeing flashing lights or spots.  Common symptoms during a seizure include:   Convulsions.   Drooling.   Rapid eye movements.   Grunting.   Loss of bladder and bowel control.   Bitter taste in the mouth.  After a seizure, you may feel confused and sleepy. You may also have an injury resulting from convulsions during the seizure.  DIAGNOSIS   Your caregiver will perform a physical exam and run some tests to determine the type and cause of your seizure. These tests may include:   Blood tests.   A lumbar puncture test. In this test, a small amount of fluid is removed from the spine and examined.   Electrocardiography (ECG). This test records the electrical activity in your heart.   Imaging tests, such as computed tomography (CT) scans or magnetic resonance imaging (MRI).   Electroencephalography (EEG). This test records the electrical activity in your brain.  TREATMENT   Seizures usually stop on their own. Treatment will depend on the cause of your seizure. In some cases, medicine may be given to prevent future seizures.  HOME CARE INSTRUCTIONS    If you are given medicines, take them exactly as prescribed by your caregiver.   Keep all follow-up appointments as directed by your caregiver.   Do not swim or drive until your caregiver says it is okay.   Teach  friends and family what to do if you have a seizure. They should:   Lay you on the ground to prevent a fall.   Put a cushion under your head.   Loosen any tight clothing around your neck.   Turn you on your side. If vomiting occurs, this helps keep your airway clear.   Stay with you until you recover.  SEEK IMMEDIATE MEDICAL CARE IF:   The seizure lasts longer than 2 to 5 minutes.   The seizure is severe or the person does not wake up after the seizure.   The person has altered mental status.  Drive the person to the emergency department or call your local emergency services (911 in [place]  MAKE SURE YOU:   Understand these instructions.   Will watch your condition.   Will get help right away if you are not doing well or get worse.  Document Released: 05/07/2000 Document Revised: 08/02/2011 Document Reviewed: 04/28/2011  [facility] Patient Information 2013 [place].

## 2012-05-18 NOTE — ED Notes (Signed)
Attempted to contact the patient for a 3rd time regarding his CT scan of the head results. As noted on previous radiological discrepancies note.    At 1130 am I left a message with a family member to have the patient return my phone call.  Have not heard from the patient.     Will provide the charge nurse with his information to send a letter due to 3 unsuccessful attempts on 3 different days.         Fredric Mare, [place]  05/18/12 2018

## 2012-05-21 NOTE — Progress Notes (Signed)
Blood culture negative.

## 2012-11-01 ENCOUNTER — Inpatient Hospital Stay: Admit: 2012-11-01 | Discharge: 2012-11-01 | Attending: Emergency Medicine

## 2012-11-01 LAB — CBC WITH AUTO DIFFERENTIAL
Basophils %: 0.4 %
Basophils Absolute: 0 10*3/uL (ref 0.0–0.2)
Eosinophils %: 2.8 %
Eosinophils Absolute: 0.1 10*3/uL (ref 0.0–0.6)
Hematocrit: 41.7 % (ref 40.5–52.5)
Hemoglobin: 14.3 g/dL (ref 13.5–17.5)
Lymphocytes %: 18 %
Lymphocytes Absolute: 1 10*3/uL (ref 1.0–5.1)
MCH: 31.3 pg (ref 26.0–34.0)
MCHC: 34.2 g/dL (ref 31.0–36.0)
MCV: 91.6 fL (ref 80.0–100.0)
MPV: 7.3 fL (ref 5.0–10.5)
Monocytes %: 6.7 %
Monocytes Absolute: 0.4 10*3/uL (ref 0.0–1.3)
Neutrophils %: 72.1 %
Neutrophils Absolute: 3.8 10*3/uL (ref 1.7–7.7)
Platelets: 220 10*3/uL (ref 135–450)
RBC: 4.56 M/uL (ref 4.20–5.90)
RDW: 13.4 % (ref 12.4–15.4)
WBC: 5.3 10*3/uL (ref 4.0–11.0)

## 2012-11-01 LAB — COMPREHENSIVE METABOLIC PANEL
ALT: 9 U/L — ABNORMAL LOW (ref 10–40)
AST: 15 U/L (ref 15–37)
Albumin/Globulin Ratio: 1.3 (ref 1.1–2.2)
Albumin: 4.3 g/dL (ref 3.4–5.0)
Alkaline Phosphatase: 152 U/L — ABNORMAL HIGH (ref 40–129)
BUN: 6 mg/dL — ABNORMAL LOW (ref 7–20)
CO2: 29 mmol/L (ref 21–32)
Calcium: 9.4 mg/dL (ref 8.3–10.6)
Chloride: 96 mmol/L — ABNORMAL LOW (ref 99–110)
Creatinine: 0.7 mg/dL — ABNORMAL LOW (ref 0.9–1.3)
GFR African American: >60 (ref >60)
GFR Non-African American: >60 (ref >60)
Globulin: 3.4 g/dL
Glucose: 111 mg/dL — ABNORMAL HIGH (ref 70–99)
Potassium: 3.9 mmol/L (ref 3.5–5.1)
Sodium: 135 mmol/L — ABNORMAL LOW (ref 136–145)
Total Bilirubin: 0.3 mg/dL (ref 0.0–1.0)
Total Protein: 7.7 g/dL (ref 6.4–8.2)

## 2012-11-01 LAB — PHENYTOIN LEVEL, TOTAL: Phenytoin Lvl: <0.8 ug/mL — ABNORMAL LOW (ref 10.0–20.0)

## 2012-11-01 MED ORDER — AZITHROMYCIN 250 MG PO TABS
250 MG | PACK | ORAL | Status: AC
Start: 2012-11-01 — End: 2012-11-11

## 2012-11-01 MED ORDER — CLINDAMYCIN HCL 150 MG PO CAPS
150 MG | ORAL_CAPSULE | Freq: Three times a day (TID) | ORAL | Status: AC
Start: 2012-11-01 — End: 2012-11-08

## 2012-11-01 MED ADMIN — phenytoin (DILANTIN) 1,000 mg in sodium chloride 0.9 % 100 mL IVPB (maintenance dose): 1000 mg | INTRAVENOUS | @ 10:00:00 | NDC 00264180032

## 2012-11-01 MED ADMIN — 0.9 % sodium chloride bolus: 1000 mL | INTRAVENOUS | @ 09:00:00 | NDC 00338004904

## 2012-11-01 MED ADMIN — naloxone (NARCAN) injection 0.4 mg: INTRAVENOUS | @ 09:00:00 | NDC 76329336901

## 2012-11-01 MED FILL — PHENYTOIN SODIUM 50 MG/ML IJ SOLN: 50 MG/ML | INTRAMUSCULAR | Qty: 20

## 2012-11-01 MED FILL — FOSPHENYTOIN SODIUM 100 MG PE/2ML IJ SOLN: 100 MG PE/2ML | INTRAMUSCULAR | Qty: 20

## 2012-11-01 MED FILL — NALOXONE HCL 1 MG/ML IJ SOLN: 1 MG/ML | INTRAMUSCULAR | Qty: 2

## 2012-11-01 MED FILL — SODIUM CHLORIDE 0.9 % IV SOLN: 0.9 % | INTRAVENOUS | Qty: 1000

## 2012-11-01 NOTE — ED Notes (Signed)
Dilantin infusion done    Joni Fears, RN  11/01/12 [phone]

## 2012-11-01 NOTE — ED Notes (Signed)
Dr. Jonita Albee to bedside for inst. and explanation of prob. aspiration pneumonia,    Geraldine Contras, RN  11/01/12 0700

## 2012-11-01 NOTE — ED Notes (Signed)
Pt sleeping in cot, awakens easily. Family at bedside call light in reach     Christa See, RN  11/01/12 [phone]

## 2012-11-01 NOTE — Discharge Instructions (Signed)
Seizure, Adult  A seizure is abnormal electrical activity in the brain. Seizures can cause a change in attention or behavior (altered mental status). Seizures often involve uncontrollable shaking (convulsions). Seizures usually last from 30 seconds to 2 minutes. Epilepsy is a brain disorder in which a patient has repeated seizures over time.  CAUSES   There are many different problems that can cause seizures. In some cases, no cause is found. Common causes of seizures include:   Head injuries.   Brain tumors.   Infections.   Imbalance of chemicals in the blood.   Kidney failure or liver failure.   Heart disease.   Drug abuse.   Stroke.   Withdrawal from certain drugs or alcohol.   Birth defects.   Malfunction of a neurosurgical device placed in the brain.  SYMPTOMS   Symptoms vary depending on the part of the brain that is involved. Right before a seizure, you may have a warning (aura) that a seizure is about to occur. An aura may include the following symptoms:    Fear or anxiety.   Nausea.   Feeling like the room is spinning (vertigo).   Vision changes, such as seeing flashing lights or spots.  Common symptoms during a seizure include:   Convulsions.   Drooling.   Rapid eye movements.   Grunting.   Loss of bladder and bowel control.   Bitter taste in the mouth.  After a seizure, you may feel confused and sleepy. You may also have an injury resulting from convulsions during the seizure.  DIAGNOSIS   Your caregiver will perform a physical exam and run some tests to determine the type and cause of your seizure. These tests may include:   Blood tests.   A lumbar puncture test. In this test, a small amount of fluid is removed from the spine and examined.   Electrocardiography (ECG). This test records the electrical activity in your heart.   Imaging tests, such as computed tomography (CT) scans or magnetic resonance imaging (MRI).   Electroencephalography (EEG). This test records the electrical  activity in your brain.  TREATMENT   Seizures usually stop on their own. Treatment will depend on the cause of your seizure. In some cases, medicine may be given to prevent future seizures.  HOME CARE INSTRUCTIONS    If you are given medicines, take them exactly as prescribed by your caregiver.   Keep all follow-up appointments as directed by your caregiver.   Do not swim or drive until your caregiver says it is okay.   Teach friends and family what to do if you have a seizure. They should:   Lay you on the ground to prevent a fall.   Put a cushion under your head.   Loosen any tight clothing around your neck.   Turn you on your side. If vomiting occurs, this helps keep your airway clear.   Stay with you until you recover.  SEEK IMMEDIATE MEDICAL CARE IF:   The seizure lasts longer than 2 to 5 minutes.   The seizure is severe or the person does not wake up after the seizure.   The person has altered mental status.  Drive the person to the emergency department or call your local emergency services (911 in [place]  MAKE SURE YOU:   Understand these instructions.   Will watch your condition.   Will get help right away if you are not doing well or get worse.  Document Released: 05/07/2000 Document Revised: 08/02/2011 Document  Reviewed: 04/28/2011  ExitCare Patient Information 2014 [place], [place].      Pneumonia, Adult  Pneumonia is an infection of the lungs. It may be caused by a germ (virus or bacteria). Some types of pneumonia can spread easily from person to person. This can happen when you cough or sneeze.  HOME CARE   Only take medicine as told by your doctor.   Take your medicine (antibiotics) as told. Finish it even if you start to feel better.   Do not smoke.   You may use a vaporizer or humidifier in your room. This can help loosen thick spit (mucus).   Sleep so you are almost sitting up (semi-upright). This helps reduce coughing.   Rest.  A shot (vaccine) can help prevent pneumonia. Shots  are often advised for:   People over 63 years old.   Patients on chemotherapy.   People with long-term (chronic) lung problems.   People with immune system problems.  GET HELP RIGHT AWAY IF:    You are getting worse.   You cannot control your cough, and you are losing sleep.   You cough up blood.   Your pain gets worse, even with medicine.   You have a fever.   Any of your problems are getting worse, not better.   You have shortness of breath or chest pain.  MAKE SURE YOU:    Understand these instructions.   Will watch your condition.   Will get help right away if you are not doing well or get worse.  Document Released: 10/27/2007 Document Revised: 08/02/2011 Document Reviewed: 07/31/2010  [facility] Patient Information 2014 [place].

## 2012-11-01 NOTE — ED Notes (Signed)
Message left for the pt. And his Mother concerning chest xray done this am.  Pt. Needs followup for poss. Pulmonary nodule through the clinic or return to ED for repeat PA and LAT.  Per Dr. Leonidas Romberg.  Letter will be sent.    Joni Fears, RN  11/01/12 1332

## 2012-11-01 NOTE — ED Provider Notes (Signed)
HPI    Review of Systems    Physical Exam    Procedures    MDM    CHIEF COMPLAINT  Seizures      HISTORY OF PRESENT ILLNESS  Roy Weaver is a 27 y.o. male with PMH of seizure who presents to the ED with seizure.    Postictal on arrival.  Patient with a history of multiple seizures.  Denies missing any doses of his phenytoin consistently but did state that he missed his last dose of Dilantin last night.  No recent illness.  No cough.  No urinary symptoms.  No chest pain or shortness of breath.  No abdominal pain.  Neurologic deficits are present.  Otherwise negative review of systems.      No other complaints, modifying factors or associated symptoms.     Nursing notes reviewed.   Past Medical History   Diagnosis Date   ??? Seizures      No past surgical history on file.  No family history on file.  History     Social History   ??? Marital Status: Single     Spouse Name: N/A     Number of Children: N/A   ??? Years of Education: N/A     Occupational History   ??? Not on file.     Social History Main Topics   ??? Smoking status: Not on file   ??? Smokeless tobacco: Not on file   ??? Alcohol Use: Not on file   ??? Drug Use: Not on file   ??? Sexually Active: Not on file     Other Topics Concern   ??? Not on file     Social History Narrative   ??? No narrative on file     Prior to Admission medications    Medication Sig Start Date End Date Taking? Authorizing Provider   phenytoin (DILANTIN) 100 MG ER capsule Take 3 capsules by mouth daily. 05/15/12   Daniel Pitstick, DO   phenytoin (DILANTIN) 300 MG ER capsule Take 300 mg by mouth daily.    Historical Provider, MD     No Known Allergies    REVIEW OF SYSTEMS  10 systems reviewed, pertinent positives per HPI otherwise noted to be negative    RECORDS REVIEWED  Nursing notes  Old medical records  Prior Studies    PHYSICAL EXAM  BP 144/75   Pulse 54   Temp(Src) 99.6 ??F (37.6 ??C) (Oral)   Resp 14   Ht 6' 3"$  (1.905 m)   Wt 190 lb (86.183 kg)   BMI 23.75 kg/m2   SpO2 95%  GENERAL APPEARANCE:  Awake and alert. Cooperative. No acute distress.  HEAD: Normocephalic. Atraumatic.  EYES: PERRL. EOM's grossly intact.   ENT: Mucous membranes are moist.   NECK: Supple. Normal ROM.   CV: Equal symmetric chest rise.  RRR, no m/r/g.  2+ distal pulses in all 4 extremities  LUNGS: Breathing is unlabored. Speaking comfortably in full sentences. CTA B.  No wheezing, rhonchi, rales  ABDOMEN: Normoactive bowel sounds.  Soft, nontender, nondistended  GU: deferred   EXTREMITIES: . Moving all 4 extremities spontaneously.  No acute deformities. All extremities neurovascularly intact.  SKIN: Warm and dry.    NEUROLOGICAL: Alert and oriented.  Normal coordination. Gait normal. Strength and sensation are intact   PSYCH: Mood and affect normal.      LABS  Results for orders placed during the hospital encounter of 11/01/12   CBC WITH AUTO DIFFERENTIAL  Result Value Range    WBC 5.3  4.0 - 11.0 K/uL    RBC 4.56  4.20 - 5.90 M/uL    Hemoglobin 14.3  13.5 - 17.5 g/dL    Hematocrit 41.7  40.5 - 52.5 %    MCV 91.6  80.0 - 100.0 fL    MCH 31.3  26.0 - 34.0 pg    MCHC 34.2  31.0 - 36.0 g/dL    RDW 13.4  12.4 - 15.4 %    Platelets 220  135 - 450 K/uL    MPV 7.3  5.0 - 10.5 fL    Neutrophils Relative 72.1      Lymphocytes Relative 18.0      Monocytes Relative 6.7      Eosinophils Relative Percent 2.8      Basophils Relative 0.4      Neutrophils Absolute 3.8  1.7 - 7.7 K/uL    Lymphocytes Absolute 1.0  1.0 - 5.1 K/uL    Monocytes Absolute 0.4  0.0 - 1.3 K/uL    Eosinophils Absolute 0.1  0.0 - 0.6 K/uL    Basophils Absolute 0.0  0.0 - 0.2 K/uL   COMPREHENSIVE METABOLIC PANEL       Result Value Range    Sodium 135 (*) 136 - 145 mmol/L    Potassium 3.9  3.5 - 5.1 mmol/L    Chloride 96 (*) 99 - 110 mmol/L    CO2 29  21 - 32 mmol/L    Glucose 111 (*) 70 - 99 mg/dL    BUN 6 (*) 7 - 20 mg/dL    CREATININE 0.7 (*) 0.9 - 1.3 mg/dL    GFR Non-African American >60  >60    GFR African American >60  >60    Calcium 9.4  8.3 - 10.6 mg/dL    Total  Protein 7.7  6.4 - 8.2 g/dL    Alb 4.3  3.4 - 5.0 g/dL    Albumin/Globulin Ratio 1.3  1.1 - 2.2    Total Bilirubin 0.3  0.0 - 1.0 mg/dL    Alkaline Phosphatase 152 (*) 40 - 129 U/L    ALT 9 (*) 10 - 40 U/L    AST 15  15 - 37 U/L    Globulin 3.4     PHENYTOIN LEVEL, TOTAL       Result Value Range    Phenytoin Lvl <0.8 (*) 10.0 - 20.0 ug/mL    Phenytoin Dose Amount Unknown          RADIOLOGY  X-RAYS: I have reviewed and interpreted the plain films    All non-plain film images such as CT, MRI, Ultrasound, have been read and interpreted by the Radiologist.    XR CHEST PORTABLE     Questionable pneumonia - as read by me      ED COURSE / MDM  Patient seen and evaluated. Labs and imaging reviewed and results discussed with patient    Roy Weaver is a 27 y.o. male with seizure.  Patient is subtherapeutic on his Dilantin level.  He was loaded.  Labs grossly negative.  Does have some evidence of phlebitis in his right lung base.  Did have no pre-seizure pneumonia type symptoms.  We are treating for possible aspiration.  He was comfortable plan and was discharged in stable condition.    ED Treatments Administered-      Medications   0.9 % sodium chloride bolus (0 mLs Intravenous Stop Time 11/01/12 0553)   naloxone (  NARCAN) injection 0.4 mg (0.4 mg Intravenous Given 11/01/12 0443)   phenytoin (DILANTIN) 1,000 mg in sodium chloride 0.9 % 100 mL IVPB (maintenance dose) (1,000 mg Intravenous Given 11/01/12 0553)        Consults -       DISPOSITION - Patient was discharged in stable condition    Results for orders placed during the hospital encounter of 11/01/12   CBC WITH AUTO DIFFERENTIAL       Result Value Range    WBC 5.3  4.0 - 11.0 K/uL    RBC 4.56  4.20 - 5.90 M/uL    Hemoglobin 14.3  13.5 - 17.5 g/dL    Hematocrit 41.7  40.5 - 52.5 %    MCV 91.6  80.0 - 100.0 fL    MCH 31.3  26.0 - 34.0 pg    MCHC 34.2  31.0 - 36.0 g/dL    RDW 13.4  12.4 - 15.4 %    Platelets 220  135 - 450 K/uL    MPV 7.3  5.0 - 10.5 fL    Neutrophils  Relative 72.1      Lymphocytes Relative 18.0      Monocytes Relative 6.7      Eosinophils Relative Percent 2.8      Basophils Relative 0.4      Neutrophils Absolute 3.8  1.7 - 7.7 K/uL    Lymphocytes Absolute 1.0  1.0 - 5.1 K/uL    Monocytes Absolute 0.4  0.0 - 1.3 K/uL    Eosinophils Absolute 0.1  0.0 - 0.6 K/uL    Basophils Absolute 0.0  0.0 - 0.2 K/uL   COMPREHENSIVE METABOLIC PANEL       Result Value Range    Sodium 135 (*) 136 - 145 mmol/L    Potassium 3.9  3.5 - 5.1 mmol/L    Chloride 96 (*) 99 - 110 mmol/L    CO2 29  21 - 32 mmol/L    Glucose 111 (*) 70 - 99 mg/dL    BUN 6 (*) 7 - 20 mg/dL    CREATININE 0.7 (*) 0.9 - 1.3 mg/dL    GFR Non-African American >60  >60    GFR African American >60  >60    Calcium 9.4  8.3 - 10.6 mg/dL    Total Protein 7.7  6.4 - 8.2 g/dL    Alb 4.3  3.4 - 5.0 g/dL    Albumin/Globulin Ratio 1.3  1.1 - 2.2    Total Bilirubin 0.3  0.0 - 1.0 mg/dL    Alkaline Phosphatase 152 (*) 40 - 129 U/L    ALT 9 (*) 10 - 40 U/L    AST 15  15 - 37 U/L    Globulin 3.4     PHENYTOIN LEVEL, TOTAL       Result Value Range    Phenytoin Lvl <0.8 (*) 10.0 - 20.0 ug/mL    Phenytoin Dose Amount Unknown         I estimate there is LOW risk for SUBARACHNOID HEMORRHAGE, MENINGITIS, INTRACRANIAL HEMORRHAGE, SUBDURAL HEMATOMA, OR STROKE, thus I consider the discharge disposition reasonable. Roy Weaver and I have discussed the diagnosis and risks, and we agree with discharging home to follow-up with their primary doctor. We also discussed returning to the Emergency Department immediately if new or worsening symptoms occur. We have discussed the symptoms which are most concerning (e.g., changing or worsening pain, weakness, vomiting, fever) that necessitate immediate return.    Final Impression  1. Seizure    2. Subtherapeutic phenytoin level    3. Pneumonia, organism unspecified        Discharge Vital Signs:  Blood pressure 124/74, pulse 69, temperature 98.7 ??F (37.1 ??C), temperature source Oral, resp. rate  14, height 6' 3"$  (1.905 m), weight 190 lb (86.183 kg), SpO2 95.00%.        If discharged, Roy Weaver was given scripts for the following medications. I counseled patient how to take these medications.   New Prescriptions    No medications on file         This note was generated using Dragon speech recognition software.  While great effort is taken to ensure accurate transcription, there may on occasion be voice recognition errors.  Please contact me with any concerns, questions or for clarification of the documentation.          Judie Bonus, MD  11/01/12 [phone]

## 2012-11-01 NOTE — ED Notes (Signed)
Refusing to give urine specimen because of cost    Geraldine Contras, [place]  11/01/12 [phone]

## 2012-11-27 ENCOUNTER — Inpatient Hospital Stay: Admit: 2012-11-27 | Discharge: 2012-11-27 | Attending: Emergency Medicine

## 2012-11-27 LAB — CBC WITH AUTO DIFFERENTIAL
Basophils %: 0.2 %
Basophils Absolute: 0 10*3/uL (ref 0.0–0.2)
Eosinophils %: 3.3 %
Eosinophils Absolute: 0.2 10*3/uL (ref 0.0–0.6)
Hematocrit: 42.3 % (ref 40.5–52.5)
Hemoglobin: 14.4 g/dL (ref 13.5–17.5)
Lymphocytes %: 17.7 %
Lymphocytes Absolute: 1.1 10*3/uL (ref 1.0–5.1)
MCH: 30.9 pg (ref 26.0–34.0)
MCHC: 34 g/dL (ref 31.0–36.0)
MCV: 90.7 fL (ref 80.0–100.0)
MPV: 7.7 fL (ref 5.0–10.5)
Monocytes %: 4.3 %
Monocytes Absolute: 0.3 10*3/uL (ref 0.0–1.3)
Neutrophils %: 74.5 %
Neutrophils Absolute: 4.8 10*3/uL (ref 1.7–7.7)
PLATELET SLIDE REVIEW: ADEQUATE
Platelets: 211 10*3/uL (ref 135–450)
RBC: 4.66 M/uL (ref 4.20–5.90)
RDW: 13.2 % (ref 12.4–15.4)
WBC: 6.5 10*3/uL (ref 4.0–11.0)

## 2012-11-27 LAB — COMPREHENSIVE METABOLIC PANEL
ALT: 9 U/L — ABNORMAL LOW (ref 10–40)
AST: 15 U/L (ref 15–37)
Albumin/Globulin Ratio: 1.1 (ref 1.1–2.2)
Albumin: 4.3 g/dL (ref 3.4–5.0)
Alkaline Phosphatase: 124 U/L (ref 40–129)
BUN: 9 mg/dL (ref 7–20)
CO2: 27 mmol/L (ref 21–32)
Calcium: 9.6 mg/dL (ref 8.3–10.6)
Chloride: 101 mmol/L (ref 99–110)
Creatinine: 0.8 mg/dL — ABNORMAL LOW (ref 0.9–1.3)
GFR African American: >60 (ref >60)
GFR Non-African American: >60 (ref >60)
Globulin: 3.9 g/dL
Glucose: 93 mg/dL (ref 70–99)
Potassium: 4.1 mmol/L (ref 3.5–5.1)
Sodium: 137 mmol/L (ref 136–145)
Total Bilirubin: 0.3 mg/dL (ref 0.0–1.0)
Total Protein: 8.2 g/dL (ref 6.4–8.2)

## 2012-11-27 LAB — PHENYTOIN LEVEL, TOTAL: Phenytoin Lvl: <0.8 ug/mL — ABNORMAL LOW (ref 10.0–20.0)

## 2012-11-27 LAB — MAGNESIUM: Magnesium: 2.3 mg/dL (ref 1.80–2.40)

## 2012-11-27 NOTE — ED Provider Notes (Signed)
CHIEF COMPLAINT  Seizures      HISTORY OF PRESENT ILLNESS  Roy Weaver is a 27 y.o. male with a history of seizures  who presents to the ED complaining of seizure.  Patient reports that he did not take his Dilantin today.  While lying on the couch at his friend's house, patient reportedly had a seizure of unknown duration.  Postictal phase was noted.  Patient also reports tongue biting urinary incontinence.  Upon arrival into the emergency department, patient is alert and oriented.  He denies any complaints at this time..   No other complaints, modifying factors or associated symptoms.     I have reviewed the following from the nursing documentation.    Past Medical History   Diagnosis Date   ??? Seizures (HCC)      History reviewed. No pertinent past surgical history.  History reviewed. No pertinent family history.  History     Social History   ??? Marital Status: Single     Spouse Name: N/A     Number of Children: N/A   ??? Years of Education: N/A     Occupational History   ??? Not on file.     Social History Main Topics   ??? Smoking status: Not on file   ??? Smokeless tobacco: Not on file   ??? Alcohol Use: No   ??? Drug Use: Not on file   ??? Sexually Active: Not on file     Other Topics Concern   ??? Not on file     Social History Narrative   ??? No narrative on file     No current facility-administered medications for this encounter.     Current Outpatient Prescriptions   Medication Sig Dispense Refill   ??? PHENYTOIN PO Take 500 mg by mouth daily.         No Known Allergies    REVIEW OF SYSTEMS  10 systems reviewed, pertinent positives per HPI otherwise noted to be negative.    PHYSICAL EXAM  BP 119/65   Pulse 96   Temp(Src) 97.7 ??F (36.5 ??C) (Oral)   Resp 16   Wt 180 lb (81.647 kg)   BMI 22.5 kg/m2   SpO2 99%  GENERAL APPEARANCE: Awake and alert. Cooperative. No acute distress.  HEAD: Normocephalic. Atraumatic.  EYES: PERRL. EOM's grossly intact.   ENT: Mucous membranes are moist.  Left-sided tongue laceration noted.  NECK:  Supple, trachea midline.   HEART: RRR. Normal S1S2, no rubs, gallops, or murmurs noted  LUNGS: Respirations unlabored. CTAB. Good air exchange. No wheezes, rales, or rhonchi.  Speaking comfortably in full sentences.   ABDOMEN: Soft. Non-distended. Non-tender. No guarding or rebound. Normal bowel sounds.  EXTREMITIES: No peripheral edema. MAEE. No acute deformities.  SKIN: Warm and dry. No acute rashes.   NEUROLOGICAL: Evidence of urinary incontinence.  Alert and oriented X 3. No gross facial drooping. Strength 5/5, sensation intact. Normal coordination. No pronator drift.  Gait normal.   PSYCHIATRIC: Normal mood and affect.    LABS  I have reviewed all labs for this visit.   Results for orders placed during the hospital encounter of 11/27/12   CBC WITH AUTO DIFFERENTIAL       Result Value Range    WBC 6.5  4.0 - 11.0 K/uL    RBC 4.66  4.20 - 5.90 M/uL    Hemoglobin 14.4  13.5 - 17.5 g/dL    Hematocrit 40.9  81.1 - 52.5 %    MCV  90.7  80.0 - 100.0 fL    MCH 30.9  26.0 - 34.0 pg    MCHC 34.0  31.0 - 36.0 g/dL    RDW 16.1  09.6 - 04.5 %    Platelets 211  135 - 450 K/uL    MPV 7.7  5.0 - 10.5 fL    PLATELET SLIDE REVIEW Adequate      Neutrophils Relative 74.5      Lymphocytes Relative 17.7      Monocytes Relative 4.3      Eosinophils Relative Percent 3.3      Basophils Relative 0.2      Neutrophils Absolute 4.8  1.7 - 7.7 K/uL    Lymphocytes Absolute 1.1  1.0 - 5.1 K/uL    Monocytes Absolute 0.3  0.0 - 1.3 K/uL    Eosinophils Absolute 0.2  0.0 - 0.6 K/uL    Basophils Absolute 0.0  0.0 - 0.2 K/uL   COMPREHENSIVE METABOLIC PANEL       Result Value Range    Sodium 137  136 - 145 mmol/L    Potassium 4.1  3.5 - 5.1 mmol/L    Chloride 101  99 - 110 mmol/L    CO2 27  21 - 32 mmol/L    Glucose 93  70 - 99 mg/dL    BUN 9  7 - 20 mg/dL    CREATININE 0.8 (*) 0.9 - 1.3 mg/dL    GFR Non-African American >60  >60    GFR African American >60  >60    Calcium 9.6  8.3 - 10.6 mg/dL    Total Protein 8.2  6.4 - 8.2 g/dL    Alb 4.3  3.4 -  5.0 g/dL    Albumin/Globulin Ratio 1.1  1.1 - 2.2    Total Bilirubin 0.3  0.0 - 1.0 mg/dL    Alkaline Phosphatase 124  40 - 129 U/L    ALT 9 (*) 10 - 40 U/L    AST 15  15 - 37 U/L    Globulin 3.9     MAGNESIUM       Result Value Range    Magnesium 2.30  1.80 - 2.40 mg/dL   PHENYTOIN LEVEL, TOTAL       Result Value Range    Phenytoin Lvl <0.8 (*) 10.0 - 20.0 ug/mL    Phenytoin Dose Amount Unknown         Rechecks:      1320: Vitals are stable.  No new neurologic symptoms.      1421: Patient reportedly signed out AMA to nursing staff without discussing with me.      ED COURSE/MDM  Patient seen and evaluated. Old records reviewed. Labs and imaging reviewed and results discussed with patient. Patient was observed and remained asymptomatic during his stay in the emergency department. Patient was reassessed as noted above.  Patient's labs revealed subtherapeutic phenytoin levels.  Patient signed out AMA. He will followup with PCP.    Patient was given scripts for the following medications. I counseled patient how to take these medications.   Discharge Medication List as of 11/27/2012  2:37 PM          CLINICAL IMPRESSION  1. Left against medical advice    2. Seizure (HCC)    3. Subtherapeutic phenytoin level        Blood pressure 119/65, pulse 96, temperature 97.7 ??F (36.5 ??C), temperature source Oral, resp. rate 16, weight 180 lb (81.647 kg), SpO2 99.00%.  DISPOSITION  Roy Weaver left against medical device prior to replacement time.         Phineas Semen, DO  11/28/12 [phone]

## 2013-02-27 ENCOUNTER — Inpatient Hospital Stay: Admit: 2013-02-27 | Discharge: 2013-02-28

## 2013-02-27 LAB — URINE DRUG SCREEN
Amphetamine Screen, Urine: NEGATIVE
Barbiturate Screen, Ur: NEGATIVE (ref <200)
Benzodiazepine Screen, Urine: NEGATIVE (ref <200)
Cannabinoid Scrn, Ur: POSITIVE — AB (ref <50)
Cocaine Metabolite Screen, Urine: NEGATIVE (ref <300)
Methadone Screen, Urine: NEGATIVE (ref <300)
Opiate Scrn, Ur: POSITIVE — AB (ref <300)
PCP Screen, Urine: NEGATIVE (ref <25)
Propoxyphene Scrn, Ur: NEGATIVE (ref <300)
pH, UA: 6

## 2013-02-27 LAB — URINALYSIS WITH REFLEX TO CULTURE
Bilirubin Urine: NEGATIVE
Blood, Urine: NEGATIVE
Glucose, Ur: NEGATIVE mg/dL
Leukocyte Esterase, Urine: NEGATIVE
Nitrite, Urine: NEGATIVE
Protein, UA: NEGATIVE mg/dL
Specific Gravity, UA: 1.02 (ref 1.005–1.030)
Urobilinogen, Urine: 0.2 E.U./dL (ref <2.0)
pH, UA: 6 (ref 5.0–8.0)

## 2013-02-27 LAB — PHENYTOIN LEVEL, TOTAL: Phenytoin Lvl: <0.8 ug/mL — ABNORMAL LOW (ref 10.0–20.0)

## 2013-02-27 MED FILL — PHENYTOIN SODIUM 50 MG/ML IJ SOLN: 50 MG/ML | INTRAMUSCULAR | Qty: 20

## 2013-02-27 NOTE — ED Notes (Signed)
Pt agreed to stay     Judd Gaudier, RN  02/27/13 1658

## 2013-02-27 NOTE — Discharge Instructions (Signed)
Take your dilantin.  Avoid alcohol.      Seizure: After Your Visit  Your Care Instructions     Seizures are caused by abnormal patterns of electrical signals in the brain. They are different for each person.  Seizures can affect movement, speech, vision, or awareness. Some people have only slight shaking of a hand and do not pass out. Other people may pass out and have violent shaking of the whole body. Some people appear to stare into space. They are awake, but they can't respond normally. Later, they may not remember what happened.  You may need tests to identify the type and cause of the seizures.  A seizure may occur only once, or you may have them more than one time. Taking medicines as directed and following up with your doctor may help keep you from having more seizures.  The doctor has checked you carefully, but problems can develop later. If you notice any problems or new symptoms, get medical treatment right away.  Follow-up care is a key part of your treatment and safety. Be sure to make and go to all appointments, and call your doctor if you are having problems. It's also a good idea to know your test results and keep a list of the medicines you take.  How can you care for yourself at home?   Be safe with medicines. Take your medicines exactly as prescribed. Call your doctor if you think you are having a problem with your medicine.   Do not do any activity that could be dangerous to you or others until your doctor says it is safe to do so. For example, do not drive a car, operate machinery, swim, or climb ladders.   Be sure that anyone treating you for any health problem knows that you have had a seizure and what medicines you are taking for it.   Identify and avoid things that may make you more likely to have a seizure. These may include lack of sleep, alcohol or drug use, stress, or not eating.   Make sure you go to your follow-up appointment.  When should you call for help?  Call 911 anytime you  think you may need emergency care. For example, call if:   You have another seizure.   You have more than one seizure in 24 hours.   You have new symptoms, such as trouble walking, speaking, or thinking clearly.  Call your doctor now or seek immediate medical care if:   You are not acting normally.  Watch closely for changes in your health, and be sure to contact your doctor if you have any problems.   Where can you learn more?   Go to [email or web address] and sign in to your MyChart account. Enter [id number] in the [place] box to learn more about "Seizure: After Your Visit."    If you do not have an account, please click on the "Sign Up Now" link.      2006-2014 [employer]. Care instructions adapted under license by [facility]. This care instruction is for use with your licensed healthcare professional. If you have questions about a medical condition or this instruction, always ask your healthcare professional. [place] any warranty or liability for your use of this information.  Content Version: 10.1.311062; Current as of: August 02, 2012

## 2013-02-27 NOTE — ED Notes (Signed)
Bed: B-32  Expected date: 02/27/13  Expected time: 3:56 PM  Means of arrival: [facility]  Comments:  seizure

## 2013-02-27 NOTE — ED Notes (Signed)
Pt had witnessed seizure lasting approx 45 seconds, edmd called to bedside and pt placed on NRB    Clista Bernhardt, RN  02/27/13 2036

## 2013-02-27 NOTE — Progress Notes (Signed)
Medication Reconciliation    List of medications patient is currently taking is complete.      Source of information:  1) Verbal discussion with patient    Allergies: NKDA    Other medication information:  1. The patient stated that his mom gave him his medication today - based off of previous notes, it is unknown whether she really did give him his dose or not today.  2. The patient denies any regular OTC/herbal products.      Jannifer HickJessica Backscheider, Pharmacy Intern  02/27/2013 8:07 PM

## 2013-02-27 NOTE — ED Notes (Signed)
Pt has a witnessed seizure by his mother, Levon Hedger is on the phone with his mom christine pt is refusing to left staff evaluate him, he falls asleep mid sentence    Janann Colonel, RN  02/27/13 [phone]

## 2013-02-27 NOTE — ED Notes (Signed)
Pt  Is on the phone with is mother    Janann Colonel, RN  02/27/13 1635

## 2013-02-27 NOTE — ED Notes (Signed)
mlp bedside to discuss results with patient    Clista Bernhardt, RN  02/27/13 [phone]

## 2013-02-27 NOTE — ED Provider Notes (Signed)
[facility] EMERGENCY DEPT  eMERGENCY dEPARTMENT eNCOUnter      Pt Name: Roy Weaver  MRN: [id number]  Birthdate 1986-01-09  Date of evaluation: 02/27/2013  Provider: Cherylin Mylar, CNP    CHIEF COMPLAINT       Chief Complaint   Patient presents with   ??? Seizures     seizure witnessed by his mom,  he drank last night pt is able to answer all questions he did take his medication per pt mom states he didnt, pt states he drank whiskey last night, and thats why he has the seizure      HISTORY OF PRESENT ILLNESS  (Location/Symptom, Timing/Onset, Context/Setting, Quality, Duration, Modifying Factors, Severity.)   Roy Weaver is a 27 y.o. male who presents to the emergency department with a seizure. Patient had a witnessed seizure by his mother today. He has a history of seizures and takes Dilantin. He states he occasionally has seizures when he drinks alcohol. He did have shots of whiskey last night. He states that's why he had a seizure today. He was lying down on the couch not feeling well when the seizure occurred. He denies any injuries. He feels sleepy presently but has no complaints. He denies headache or neck pain. He has no nausea or vomiting. He has no abdominal pain.       Nursing Notes were reviewed.    REVIEW OF SYSTEMS    (2-9 systems for level 4, 10 or more for level 5)     Review of Systems   Constitutional: Negative for fever, chills and fatigue.   HENT: Negative.    Eyes: Negative.    Respiratory: Negative for chest tightness and shortness of breath.    Cardiovascular: Negative.    Gastrointestinal: Negative.  Negative for nausea, vomiting and abdominal pain.   Genitourinary: Negative.    Musculoskeletal: Negative for myalgias and back pain.   Skin: Negative.    Allergic/Immunologic: Negative.    Neurological: Positive for seizures and weakness. Negative for dizziness, syncope, speech difficulty, light-headedness and headaches.   Hematological: Negative.    Psychiatric/Behavioral: Negative.             Except as noted above the remainder of the review of systems was reviewed and negative.       PAST MEDICAL HISTORY         Diagnosis Date   ??? Seizures (HCC)        SURGICAL HISTORY     History reviewed. No pertinent past surgical history.    CURRENT MEDICATIONS       Previous Medications    PHENYTOIN (DILANTIN) 300 MG ER CAPSULE    Take 1 capsule by mouth daily.       ALLERGIES     Review of patient's allergies indicates no known allergies.    FAMILY HISTORY     History reviewed. No pertinent family history.  No family status information on file.        SOCIAL HISTORY      reports that he has never smoked. He does not have any smokeless tobacco history on file. He reports that he uses illicit drugs (Marijuana). He reports that he does not drink alcohol.    PHYSICAL EXAM    (up to 7 for level 4, 8 or more for level 5)   ED Triage Vitals   BP Temp Temp src Pulse Resp SpO2 Height Weight   02/27/13 1619 02/27/13 1619 -- 02/27/13 1619 02/27/13 1619 02/27/13 1619 -- --  115/60 mmHg 98.2 ??F (36.8 ??C)  82 16 100 %         Physical Exam   Constitutional: He is oriented to person, place, and time. He appears well-developed and well-nourished. No distress.   HENT:   Head: Normocephalic and atraumatic.   Mouth/Throat: Oropharynx is clear and moist.   Eyes: EOM are normal. Pupils are equal, round, and reactive to light.   Neck: Normal range of motion. Neck supple.   Cardiovascular: Normal rate, regular rhythm and intact distal pulses.    Pulmonary/Chest: Effort normal and breath sounds normal. No respiratory distress. He exhibits no tenderness.   Abdominal: Soft. Bowel sounds are normal. He exhibits no distension. There is no tenderness. There is no rebound and no guarding.   Musculoskeletal: Normal range of motion.   Neurological: He is alert and oriented to person, place, and time.   Skin: Skin is warm and dry.   Psychiatric: He has a normal mood and affect. Judgment normal.   Nursing note and vitals  reviewed.            LABS:  Labs Reviewed   PHENYTOIN LEVEL, TOTAL - Abnormal; Notable for the following:     Phenytoin Lvl <0.8 (*)     All other components within normal limits   URINE RT REFLEX TO CULTURE - Abnormal; Notable for the following:     Ketones, Urine TRACE (*)     All other components within normal limits   URINE DRUG SCREEN - Abnormal; Notable for the following:     Cannabinoid Scrn, Ur POSITIVE (*)     Opiate Scrn, Ur POSITIVE (*)     All other components within normal limits       All other labs were within normal range or not returned as of this dictation.    EMERGENCY DEPARTMENT COURSE and DIFFERENTIAL DIAGNOSIS/MDM:   Vitals:    Filed Vitals:    02/27/13 2030 02/27/13 2033 02/27/13 2044 02/27/13 2059   BP: 119/62  138/57 128/60   Pulse: 76 113 67 73   Temp:       Resp:       SpO2: 94% 100% 100% 96%       Patient presents to the emergency department after having a seizure today. His history of seizures and was drink alcohol last night. He states this occurs after he drinks. He has not missed any of his medications. He was lying down on a couch when he had the seizure and denies any trauma. His Dilantin level was checked. Patient's Dilantin level was subtherapeutic at less than 0.2. I discussed this with him and he does say that he takes his Dilantin but just sometimes not every day.  He was given a loading dose of Dilantin of 1 g IV.  He remained hemodynamically stable throughout his emergency department course. He was neurologically intact. He was discharged home in improved and stable condition. patient was seen by the attending physician and they agreed with the assessment and plan.    CONSULTS:  None    PROCEDURES:  None    FINAL IMPRESSION      1. Seizure [facility])          DISPOSITION/PLAN   DISPOSITION Decision to Discharge    PATIENT REFERRED TO:  Cedric Fishman, DO  [address]  [place] [address]  [phone]    Schedule an appointment as soon as possible for a visit  For follow-up  evaluation  Horace Porteous, MD  [phone] [facility]  [address]  [place] [address]  [phone]    Schedule an appointment as soon as possible for a visit        DISCHARGE MEDICATIONS:  New Prescriptions    No medications on file       (Please note that portions of this note were completed with a voice recognition program.  Efforts were made to edit the dictations but occasionally words are mis-transcribed.)    Cherylin Mylar, CNP        Cherylin Mylar, CNP  02/27/13 2127

## 2013-02-27 NOTE — ED Notes (Signed)
Lab stated there is only one person drawing blood, we are having a third nurse try     Judd Gaudier, RN  02/27/13 1850

## 2013-02-27 NOTE — ED Notes (Signed)
Pt was stuck by two nurses unable to draw lab, lab called    Janann Colonel, RN  02/27/13 1758

## 2013-02-27 NOTE — ED Notes (Signed)
Placed 2nd call to lab    Judd Gaudier, RN  02/27/13 [phone]

## 2013-02-27 NOTE — ED Notes (Signed)
Mom states pt did vomit prior to seize today, he was very tired,     Janann Colonel, RN  02/27/13 [phone]

## 2013-02-27 NOTE — ED Notes (Signed)
Pt still very drowsy, he is upset that staff wakes him    Janann Colonel, RN  02/27/13 1851

## 2013-02-27 NOTE — ED Provider Notes (Signed)
Attending Supervisory Note/Shared Visit   I have personally performed a face to face diagnostic evaluation on this patient.    History is 27 year old male who has a history of seizures. Patient tells me that he takes Dilantin 2 tablets a day. He does not see a neurologist tells me that he gets his Dilantin from the emergency department. We do these called neurology and is waiting for call back from them. Presents today following a seizure. He does admit to having one shot of whiskey last night.    Exam shows a well-developed well-nourished male who is awake alert appears in no distress. HEENT some cephalic neck is supple lungs are clear no wheezes or stridor cardiacs regular rate and rhythm abdomen is soft nontender remedies without cyanosis edema deformity neurologic exam is nonfocal.    The patient's Dilantin level is undetectable. We'll load him with Dilantin and ensure that he has a prescription is his Dilantin 300 mg a day and he is given the phone number for neurology for follow-up.    I agree with the Mid-level's assessment, evaluation, findings and treatment plan.      (Please note that portions of this note were completed with a voice recognition program.  Efforts were made to edit the dictations but occasionally words are mis-transcribed.)    Carolina Sink, MD  Attending Emergency Physician      Carolina Sink, MD  02/27/13 2103

## 2013-02-27 NOTE — ED Notes (Signed)
seizure witnessed by his mom,  he drank last night pt is able to answer all questions he did take his medication per pt mom states he didn't, pt states he drank whiskey last night, and that's why he has the seizure     Judd Gaudier, RN  02/27/13 1619

## 2013-02-28 MED ADMIN — phenytoin (DILANTIN) 1,000 mg in sodium chloride 0.9 % 100 mL IVPB (loading dose): INTRAVENOUS | NDC 00641255541

## 2013-04-03 MED ORDER — PHENYTOIN SODIUM EXTENDED 200 MG PO CAPS
200 MG | ORAL_CAPSULE | Freq: Every evening | ORAL | Status: DC
Start: 2013-04-03 — End: 2013-08-07
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
## 2013-04-16 ENCOUNTER — Encounter

## 2013-04-17 LAB — PHENYTOIN LEVEL, TOTAL: Phenytoin Lvl: 15.2 ug/mL (ref 10.0–20.0)

## 2013-04-18 NOTE — Progress Notes (Signed)
Phenytoin level is therapeutic. No changes to Phenytoin (Dilantin) dose need to be done. Thanks.

## 2013-05-03 NOTE — Assessment & Plan Note (Signed)
Well controlled.

## 2013-05-03 NOTE — Patient Instructions (Signed)
Continue Phenytoin (Dilantin) at the same dose.   Return to clinic in 3 months.

## 2013-05-03 NOTE — Progress Notes (Signed)
Neurology Progress Note:  This is a 27 year old gentleman who was seen at the Neurology Clinic today in follow up for  seizures and migraine. He was unaccompanied.  is on Phenytoin 2x200 mg a day. Phenytoin level was therapeutic at 15.2. (04/16/13). He denies side effects from Phenytoin other than the sleepiness. He denies nausea, vomiting. could not tolerate Topamax in the past. He denies any seizures since his last clinic visit.works full time, does not drive.\\He has not had a migraine since his last clinic visit. The migraines are throbbing, by chocolate, seizures, are diffuse to the exception of the sub occipital area. They are associated with photophobia, sonophobia, has to lie down. Usually he takes Tylenol 3 and lies down.    Current Outpatient Prescriptions   Medication Sig Dispense Refill   ??? phenytoin (DILANTIN) 200 MG ER capsule Take 2 capsules by mouth nightly.  60 capsule  3     No current facility-administered medications for this visit.     REVIEW OF SYSTEMS:   In addition to as noted in the HPI, the following ROS was obtained. Positives are marked. All others are negative .    Constitutional:  []    Chills   []   Fatigue   []   Fevers   []   Malaise   []   Weight loss   []  Other:      Eyes:   []   Double vision   []   Blurry vision   []  Other:     Ears, nose, mouth, throat, and face:   []   Earaches  []  Hearing loss    []    Hoarseness    []   Nasal congestion    []   Snoring    []   Tinnitus     []  Other:     Respiratory:   []   Cough    []   Shortness of breath    []   Chest pain    []  Other:     Cardiovascular:   []   Chest pain  []   Dyspnea    []   Exertional chest pressure/discomfort     []  Fatigue      []   Palpitations    []   Syncope   []  Other:      Gastrointestinal:   []   Abdominal pain   []   Constipation    []   Diarrhea    []    Dysphagia   []   Reflux             []   Vomiting      []  Other:     Genitourinary:  []   Dysuria     []   Frequency   []   Hematuria   []  Nocturia   []   Urinary incontinence         []   Other:     Hematologic/lymphatic:  []   Bleeding    []   Easy bruising   []   Petechiae   []  Other:     Musculoskeletal:   [] Back pain    [] Muscle weakness   [] Myalgias    [] Neck pain     [] Stiff joints     []  Other:     Neurological: As noted in HPI    Behavioral/Psych:   []  Anxiety    []   Depression     [x]   Mood swings   []  Other:     Endocrine:   []   Temperature intolerance     []  Fatigue    []  Other:  Allergic/Immunologic:   []  Hay fever    []  Other:     General exam:  Vital signs:  BP 115/68   Pulse 75   Temp(Src) 98.3 ??F (36.8 ??C)   Ht 6\' 4"  (1.93 m)   Wt 195 lb (88.451 kg)   BMI 23.75 kg/m2    General appearance: pleasant, in no acute distress.  Eyes: normal conjunctivae, no exudates.  Heart: no murmurs noted.  Lungs clear to auscultation.    Physical Exam:  Neurologic exam:  Mental status: awake, alert, grossly well oriented to time, place and person. Attention span normal. Mood, affect normal. Fund of knowledge intact. Speech normal and fluent.  Cranial nerves: Pupils equal in size, reactive to light. Intact extraocular movements. No nystagmus. No ptosis noted. Normal facial symmetry noted. No dysarthria noted. Hearing grossly intact. No tongue weakness.   Motor: intact in the upper and lower extremities.  No arm pronator drift noted.  Deep tendon reflexes within normal range in the upper and lower extremities at the biceps, brachioradialis, triceps, knees and ankles.  No tremor.  No dysmetria noted on finger to nose.  Gait: intact with no ataxia. Tandem gait normal. Romberg (-).    Assessment/Plan:    1. Seizures (HCC)    2. Migraine      The seizures and migraines are well controlled on Phenytoin 200 x2 (400 mg) a day.  His Phenytoin level is therapeutic at 15.2. He denies significant side effects from it.     Return to clinic in 3 months.    The patient has no PCP.    Horace Porteous, MD

## 2013-06-30 ENCOUNTER — Inpatient Hospital Stay: Admit: 2013-06-30 | Discharge: 2013-06-30 | Attending: Emergency Medicine

## 2013-06-30 LAB — CBC WITH AUTO DIFFERENTIAL
Basophils %: 0.9 %
Basophils Absolute: 0 10*3/uL (ref 0.0–0.2)
Eosinophils %: 6.1 %
Eosinophils Absolute: 0.2 10*3/uL (ref 0.0–0.6)
Hematocrit: 39.4 % — ABNORMAL LOW (ref 40.5–52.5)
Hemoglobin: 13.4 g/dL — ABNORMAL LOW (ref 13.5–17.5)
Lymphocytes %: 35.9 %
Lymphocytes Absolute: 1.3 10*3/uL (ref 1.0–5.1)
MCH: 30.6 pg (ref 26.0–34.0)
MCHC: 34 g/dL (ref 31.0–36.0)
MCV: 90 fL (ref 80.0–100.0)
MPV: 7.1 fL (ref 5.0–10.5)
Monocytes %: 10.6 %
Monocytes Absolute: 0.4 10*3/uL (ref 0.0–1.3)
Neutrophils %: 46.5 %
Neutrophils Absolute: 1.7 10*3/uL (ref 1.7–7.7)
Platelets: 211 10*3/uL (ref 135–450)
RBC: 4.37 M/uL (ref 4.20–5.90)
RDW: 13 % (ref 12.4–15.4)
WBC: 3.6 10*3/uL — ABNORMAL LOW (ref 4.0–11.0)

## 2013-06-30 LAB — COMPREHENSIVE METABOLIC PANEL
ALT: 28 U/L (ref 10–40)
AST: 40 U/L — ABNORMAL HIGH (ref 15–37)
Albumin/Globulin Ratio: 1.3 (ref 1.1–2.2)
Albumin: 4.4 g/dL (ref 3.4–5.0)
Alkaline Phosphatase: 121 U/L (ref 40–129)
Anion Gap: 13 (ref 3–16)
BUN: 9 mg/dL (ref 7–20)
CO2: 27 mmol/L (ref 21–32)
Calcium: 9.5 mg/dL (ref 8.3–10.6)
Chloride: 101 mmol/L (ref 99–110)
Creatinine: 0.8 mg/dL — ABNORMAL LOW (ref 0.9–1.3)
GFR African American: >60 (ref >60)
GFR Non-African American: >60 (ref >60)
Globulin: 3.3 g/dL
Glucose: 111 mg/dL — ABNORMAL HIGH (ref 70–99)
Potassium: 4.1 mmol/L (ref 3.5–5.1)
Sodium: 141 mmol/L (ref 136–145)
Total Bilirubin: 0.2 mg/dL (ref 0.0–1.0)
Total Protein: 7.7 g/dL (ref 6.4–8.2)

## 2013-06-30 LAB — TROPONIN: Troponin: <0.01 ng/mL (ref <0.01)

## 2013-06-30 LAB — PHENYTOIN LEVEL, TOTAL: Phenytoin Lvl: 23.7 ug/mL — ABNORMAL HIGH (ref 10.0–20.0)

## 2013-06-30 MED ORDER — LEVETIRACETAM 500 MG PO TABS
500 MG | ORAL_TABLET | Freq: Two times a day (BID) | ORAL | Status: DC
Start: 2013-06-30 — End: 2013-07-26

## 2013-06-30 NOTE — ED Provider Notes (Addendum)
Roy Weaver is a 28 y.o. male who presents with a complaint of   Chief Complaint   Patient presents with   ??? 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  Patient with syncopal episode at work history of seizure disorder  On my exam:  Vital Signs: Resp 18    Ht 6\' 4"  (1.93 m)    Wt 195 lb (88.451 kg)    BMI 23.75 kg/m2     General: nontoxic  ctab  rrr       Labs Reviewed - No data to display  Chest  X-ray has been read by myself as no acute disease, no fracture noted    ER EKG read by myself is rate of 87 normal sinus rhythm, normal axis, no QT prolongation, nonspecific EKG, lvh    This patient was seen by the mid-level provider.  I have seen and examined the patient face to face, agree with the workup, evaluation, management and diagnosis.  Care plan has been discussed.              Hendricks LimesLee Underwood Onix Jumper, MD  06/30/13 1615    Hendricks LimesLee Underwood Serge Main, MD  06/30/13 1757    Hendricks LimesLee Underwood Marquie Aderhold, MD  06/30/13 [phone]

## 2013-06-30 NOTE — Discharge Instructions (Signed)
Seizure: After Your Visit  Your Care Instructions     Seizures are caused by abnormal patterns of electrical signals in the brain. They are different for each person.  Seizures can affect movement, speech, vision, or awareness. Some people have only slight shaking of a hand and do not pass out. Other people may pass out and have violent shaking of the whole body. Some people appear to stare into space. They are awake, but they can't respond normally. Later, they may not remember what happened.  You may need tests to identify the type and cause of the seizures.  A seizure may occur only once, or you may have them more than one time. Taking medicines as directed and following up with your doctor may help keep you from having more seizures.  The doctor has checked you carefully, but problems can develop later. If you notice any problems or new symptoms, get medical treatment right away.  Follow-up care is a key part of your treatment and safety. Be sure to make and go to all appointments, and call your doctor if you are having problems. It's also a good idea to know your test results and keep a list of the medicines you take.  How can you care for yourself at home?   Be safe with medicines. Take your medicines exactly as prescribed. Call your doctor if you think you are having a problem with your medicine.   Do not do any activity that could be dangerous to you or others until your doctor says it is safe to do so. For example, do not drive a car, operate machinery, swim, or climb ladders.   Be sure that anyone treating you for any health problem knows that you have had a seizure and what medicines you are taking for it.   Identify and avoid things that may make you more likely to have a seizure. These may include lack of sleep, alcohol or drug use, stress, or not eating.   Make sure you go to your follow-up appointment.  When should you call for help?  Call 911 anytime you think you may need emergency care. For  example, call if:   You have another seizure.   You have more than one seizure in 24 hours.   You have new symptoms, such as trouble walking, speaking, or thinking clearly.  Call your doctor now or seek immediate medical care if:   You are not acting normally.  Watch closely for changes in your health, and be sure to contact your doctor if you have any problems.   Where can you learn more?   Go to [email or web address] and sign in to your MyChart account. Enter M769 in the Search Health Information box to learn more about "Seizure: After Your Visit."    If you do not have an account, please click on the "Sign Up Now" link.      2006-2014 [employer]. Care instructions adapted under license by [facility]. This care instruction is for use with your licensed healthcare professional. If you have questions about a medical condition or this instruction, always ask your healthcare professional. [employer] disclaims any warranty or liability for your use of this information.  Content Version: 10.3.368381; Current as of: August 02, 2012

## 2013-06-30 NOTE — ED Notes (Signed)
Bed: A-16  Expected date: 06/30/13  Expected time: 4:07 PM  [facility]  Comments:  27yo male syncopal episode.

## 2013-06-30 NOTE — ED Provider Notes (Signed)
[facility]  eMERGENCY dEPARTMENT eNCOUnter      Pt Name: Roy Weaver  MRN: [id number]  Birthdate 04/22/1986  Date of evaluation: 06/30/2013  Provider: Cherylin MylarMARCIA A Deaveon Schoen, CNP    CHIEF COMPLAINT       Chief Complaint   Patient presents with   ??? 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 male who presents to the emergency department with an apparent loss of consciousness. 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 He does have a history of seizures. He takes Dilantin. He states he has not missed any doses. He has very infrequent seizures approximately one or 2 a year. These are full body tonic-clonic. He was not incontinent of urine. He was slow to arouse and per squad has slowly started to answer questions appropriately. He on arrival to the emergency Department is awake and alert. He is sleepy still. Patient states the last thing he remembers is being at work. He then remembers waking up in the back of the ambulance to the paramedics. He denies any injuries. He has no pain.    Nursing Notes were reviewed.    REVIEW OF SYSTEMS    (2-9 systems for level 4, 10 or more for level 5)     Review of Systems   Constitutional: Negative.    HENT: Negative.    Eyes: Negative.    Respiratory: Negative for chest tightness and shortness of breath.    Cardiovascular: Negative for chest pain.   Gastrointestinal: Negative for nausea, vomiting and abdominal pain.   Endocrine: Negative.    Genitourinary:        No urinary incontinence   Musculoskeletal: Negative.    Skin: Negative.    Allergic/Immunologic: Negative.    Neurological: Positive for syncope. Negative for dizziness and headaches.   Hematological: Negative.    Psychiatric/Behavioral: Negative.        Except as noted above the remainder of  the review of systems was reviewed and negative.       PAST MEDICAL HISTORY         Diagnosis Date   ??? Seizures (HCC)    ??? Migraine        SURGICAL HISTORY     History reviewed. No pertinent past surgical history.    CURRENT MEDICATIONS       Previous Medications    PHENYTOIN (DILANTIN) 200 MG ER CAPSULE    Take 2 capsules by mouth nightly.       ALLERGIES     Review of patient's allergies indicates no known allergies.    FAMILY HISTORY           Problem Relation Age of Onset   ??? Diabetes Father      Family Status   Relation Status Death Age   ??? Father Alive    ??? Mother Alive         SOCIAL HISTORY      reports that he has been smoking Cigarettes.  He has been smoking about 0.50 packs per day. He does not have any smokeless tobacco history on file. He reports that he uses illicit drugs (Marijuana). He reports that he does not drink alcohol.    PHYSICAL EXAM    (up to 7 for level  4, 8 or more for level 5)   ED Triage Vitals   BP Temp Temp Source Pulse Resp SpO2 Height Weight   06/30/13 1614 06/30/13 1614 06/30/13 1614 06/30/13 1614 06/30/13 1614 -- 06/30/13 1614 06/30/13 1614   126/74 mmHg 99.2 ??F (37.3 ??C) Oral 94 18  6\' 4"  (1.93 m) 195 lb (88.451 kg)       Physical Exam   Constitutional: He is oriented to person, place, and time. He appears well-developed and well-nourished. No distress.   HENT:   Head: Normocephalic and atraumatic.   Mouth/Throat: Oropharynx is clear and moist.   Eyes: EOM are normal. Pupils are equal, round, and reactive to light.   Neck: Normal range of motion. Neck supple.   Cardiovascular: Normal rate, regular rhythm and intact distal pulses.    Pulmonary/Chest: Effort normal and breath sounds normal. He exhibits no tenderness.   Abdominal: Soft. Bowel sounds are normal. He exhibits no distension. There is no tenderness. There is no rebound.   Musculoskeletal: Normal range of motion.   Neurological: He is alert and oriented to person, place, and time.   Skin: Skin is warm and dry.    Psychiatric: He has a normal mood and affect. Judgment normal.   Nursing note and vitals reviewed.          DIAGNOSTIC RESULTS     EKG: All EKG's are interpreted by the Emergency Department Physician who either signs or Co-signs this chart in the absence of a cardiologist.    Normal sinus rhythm  Ventricular rate of 87  No qt prolongation  No ST segment elevation or depression no evidence of ischemia  RADIOLOGY:   Non-plain film images such as CT, Ultrasound and MRI are read by the radiologist. Plain radiographic images are visualized and preliminarily interpreted by the emergency physician with the below findings:    Chest x-ray reveals no acute cardiopulmonary process. This was read by myself in the absence of radiology.    Interpretation per the Radiologist below, if available at the time of this note:    CT HEAD WO CONTRAST    Final Result: IMPRESSION: No acute intracranial abnormality.       XR CHEST STANDARD TWO VW    (Results Pending)         LABS:  Labs Reviewed   CBC WITH AUTO DIFFERENTIAL - Abnormal; Notable for the following:     WBC 3.6 (*)     Hemoglobin 13.4 (*)     Hematocrit 39.4 (*)     All other components within normal limits   COMPREHENSIVE METABOLIC PANEL - Abnormal; Notable for the following:     Glucose 111 (*)     CREATININE 0.8 (*)     AST 40 (*)     All other components within normal limits   PHENYTOIN LEVEL, TOTAL - Abnormal; Notable for the following:     Phenytoin Lvl 23.7 (*)     All other components within normal limits   TROPONIN   URINE DRUG SCREEN   URINE RT REFLEX TO CULTURE       All other labs were within normal range or not returned as of this dictation.    EMERGENCY DEPARTMENT COURSE and DIFFERENTIAL DIAGNOSIS/MDM:   Vitals:    Filed Vitals:    06/30/13 1614 06/30/13 1703 06/30/13 1717 06/30/13 1725   BP: 126/74 126/73 135/83    Pulse: 94 87 81 83   Temp: 99.2 ??F (37.3 ??C)  TempSrc: Oral      Resp: 18      Height: 6\' 4"  (1.93 m)      Weight: 195 lb (88.451 kg)      SpO2:   98% 96% 97%       Patient presents to the emergency department after a syncopal episode. He does have a history of seizures. This was unwitnessed. He was slow to gain responsiveness. An IV was established and labs were drawn. EKG and chest x-ray were obtained. CT scan of the head was also performed. We will obtain Dilantin levels.  Patient's laboratory workup was unremarkable. He did have a elevated Dilantin level of 23.7. CAT scan showed no acute intracranial process. Chest x-ray was also negative. I did contact neurology and spoke with Dr. Kizzie Bane who was on-call for the patient's neurologist. He did feel that it was appropriate to add Keppra to the patient's regimen. He'll be started on 500 twice a day. The patient does have a appointment to see neurology in March. He was instructed to contact sooner so that he may have follow-up. He remained neurologically intact throughout this emergency department course. He was discharged home in good and stable condition.  This patient was seen by the attending physician and they agreed with the assessment and plan.    CONSULTS:  IP CONSULT TO NEUROLOGY    PROCEDURES:  None    FINAL IMPRESSION      1. Seizures (HCC)          DISPOSITION/PLAN   DISPOSITION Decision to Discharge    PATIENT REFERRED TO:  Kermit Balo, MD  [id number] [facility]  [address]  [place] [address]  [phone]    Call in 2 days  For follow-up evaluation      DISCHARGE MEDICATIONS:  New Prescriptions    LEVETIRACETAM (KEPPRA) 500 MG TABLET    Take 1 tablet by mouth 2 times daily.       (Please note that portions of this note were completed with a voice recognition program.  Efforts were made to edit the dictations but occasionally words are mis-transcribed.)    Talan Gildner Karrie Doffing, CNP        Cherylin Mylar, CNP  06/30/13 1827

## 2013-06-30 NOTE — ED Notes (Addendum)
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 Pt sts that he does have a history of seizures. Sts that the first thing he remembers was waking up in the ambulance, with the paramedics "sticking him with stuff." Has approx 1 seizure/year. Says that he has been taking his medication. Seizure pads applied to bed rails.        Macarthur CritchleyKathryn Marie Adiel Mcnamara, RN  06/30/13 [phone]

## 2013-07-01 LAB — EKG 12-LEAD
Atrial Rate: 87 {beats}/min
P Axis: 55 degrees
P-R Interval: 186 ms
Q-T Interval: 352 ms
QRS Duration: 86 ms
QTc Calculation (Bazett): 423 ms
R Axis: 83 degrees
T Axis: 50 degrees
Ventricular Rate: 87 {beats}/min

## 2013-07-02 NOTE — Telephone Encounter (Signed)
Patient was seen in the emergency Department 06/30/13 and was referred to Dr Kerin SalenBoughaba for follow up. Patient has a appt with Dr Glean Hessajan in March due to Dr Vladimir FasterBoughaba's schedule being closed. I spoke to patient's mom to make appt with Dr Vidal Schwalbeajan's office for urgent follow up visit and I also gave her the number to Dr Kizzie BaneHughes office just in case Dr Glean Hessajan wasn't able to see patient until March.

## 2013-07-26 ENCOUNTER — Inpatient Hospital Stay: Admit: 2013-07-26 | Discharge: 2013-07-26 | Attending: Emergency Medicine

## 2013-07-26 LAB — PHENYTOIN LEVEL, TOTAL: Phenytoin Lvl: 7.9 ug/mL — ABNORMAL LOW (ref 10.0–20.0)

## 2013-07-26 MED ORDER — LORAZEPAM 1 MG PO TABS
1 MG | ORAL_TABLET | Freq: Three times a day (TID) | ORAL | Status: AC
Start: 2013-07-26 — End: 2013-07-28

## 2013-07-26 MED ADMIN — phenytoin (DILANTIN) 600 mg in sodium chloride 0.9 % 100 mL IVPB (maintenance dose): 600 mg | INTRAVENOUS | @ 14:00:00 | NDC 00641255541

## 2013-07-26 MED ADMIN — LORazepam (ATIVAN) tablet 1 mg: 1 mg | ORAL | @ 14:00:00 | NDC 68084074211

## 2013-07-26 MED ADMIN — LORazepam (ATIVAN) injection 1 mg: 1 mg | INTRAVENOUS | @ 14:00:00 | NDC 00641604401

## 2013-07-26 MED ADMIN — phenytoin (DILANTIN) ER capsule 200 mg: 200 mg | ORAL | @ 14:00:00 | NDC 51079090501

## 2013-07-26 MED FILL — PHENYTOIN SODIUM EXTENDED 100 MG PO CAPS: 100 MG | ORAL | Qty: 2

## 2013-07-26 MED FILL — LORAZEPAM 1 MG PO TABS: 1 MG | ORAL | Qty: 1

## 2013-07-26 MED FILL — LORAZEPAM 2 MG/ML IJ SOLN: 2 MG/ML | INTRAMUSCULAR | Qty: 1

## 2013-07-26 MED FILL — PHENYTOIN SODIUM 50 MG/ML IJ SOLN: 50 MG/ML | INTRAMUSCULAR | Qty: 12

## 2013-07-26 NOTE — ED Notes (Signed)
Pt having an active seizure mom at bedside, pt placed on 02 2l nc, pt air patent, pt suctioned, 18 placed in left ac, siezure last about 45 secs started to finish, pt post ictal, [place], RN  07/26/13 [phone]

## 2013-07-26 NOTE — ED Notes (Signed)
Placed a call to pts mom Wynona CanesChristine she states she will be here in about 20 mins, pt still drowsy however he awakes to name, and answers appropriately     Janann ColonelMelissa L Jones, RN  07/26/13 1129

## 2013-07-26 NOTE — ED Notes (Signed)
Pts mom called 911 this am states her son was having a seizure, when ems arrived pt was post ictal, pt than became combative in squad, mom reports that he is non-compliant at times with his medication, he was not home all weekend and she does not think he took his meds. Pt arrived to Er alert and orient however, he states wants to go home and that he doesn't want to be here, Dr. Daphane ShepherdMeyer spoke with the Pt and he is going to stay and relax for a little while. Seizure pads on bed side rails up cardiac monitor on     Janann ColonelMelissa L Jones, RN  07/26/13 0825

## 2013-07-26 NOTE — ED Notes (Signed)
Pt gait steady     Janann ColonelMelissa L Jones, RN  07/26/13 1223

## 2013-07-26 NOTE — Discharge Instructions (Signed)
Seizure: After Your Visit  Your Care Instructions     Seizures are caused by abnormal patterns of electrical signals in the brain. They are different for each person.  Seizures can affect movement, speech, vision, or awareness. Some people have only slight shaking of a hand and do not pass out. Other people may pass out and have violent shaking of the whole body. Some people appear to stare into space. They are awake, but they can't respond normally. Later, they may not remember what happened.  You may need tests to identify the type and cause of the seizures.  A seizure may occur only once, or you may have them more than one time. Taking medicines as directed and following up with your doctor may help keep you from having more seizures.  The doctor has checked you carefully, but problems can develop later. If you notice any problems or new symptoms, get medical treatment right away.  Follow-up care is a key part of your treatment and safety. Be sure to make and go to all appointments, and call your doctor if you are having problems. It's also a good idea to know your test results and keep a list of the medicines you take.  How can you care for yourself at home?   Be safe with medicines. Take your medicines exactly as prescribed. Call your doctor if you think you are having a problem with your medicine.   Do not do any activity that could be dangerous to you or others until your doctor says it is safe to do so. For example, do not drive a car, operate machinery, swim, or climb ladders.   Be sure that anyone treating you for any health problem knows that you have had a seizure and what medicines you are taking for it.   Identify and avoid things that may make you more likely to have a seizure. These may include lack of sleep, alcohol or drug use, stress, or not eating.   Make sure you go to your follow-up appointment.  When should you call for help?  Call 911 anytime you think you may need emergency care. For  example, call if:   You have another seizure.   You have more than one seizure in 24 hours.   You have new symptoms, such as trouble walking, speaking, or thinking clearly.  Call your doctor now or seek immediate medical care if:   You are not acting normally.  Watch closely for changes in your health, and be sure to contact your doctor if you have any problems.   Where can you learn more?   Go to [email or web address] and sign in to your MyChart account. Enter [phone] in the [place] box to learn more about "Seizure: After Your Visit."    If you do not have an account, please click on the "Sign Up Now" link.      2006-2015 Healthwise, Incorporated. Care instructions adapted under license by [facility]. This care instruction is for use with your licensed healthcare professional. If you have questions about a medical condition or this instruction, always ask your healthcare professional. [place] any warranty or liability for your use of this information.  Content Version: 10.4.390249; Current as of: August 02, 2012            [facility] Referral number [phone] for [place] F. [facility]  [address]  [address]  [phone]  Fax [phone]  Medical, OB/Gyn, Pediatrics, [place]  Serves all of [place] ([place])  [place]  [place], [place] [address]  [place]  [address] (Administrative offices)  [phone]  Homeless only [facility])  [address], [place] [address]  [phone] or [phone], [place],   Dental Appointments [phone] or [phone]  Pediatric, Family Practice, X-Ray  Serves all of [facility]  [place].  [place], [place] [address]  [phone]   [place] [phone]. Healthy)   [address]    (Located in [place])  [phone] or [phone], [place] [phone],   Dental Appointments [phone] or [phone]     [facility]  [place], [place] [address]  [place]  [facility]  [place].  [place] Fax [place] and Surrounding areas [facility]  [address] [place] Fax [phone]  Medical, OB/Gyn, Pediatrics  Dental Clinic, [place] limits only     [facility] [address]  [place]  [phone] Fax [phone]  [facility], Pediatrics, [place], [facility] area   [facility]  [address]    [phone]  Urgent Care, Open 24 hrs, Urgent care, Gyn, Prenatal, Dental Mental, [place] [address] [phone]  (Located: [place])  Pediatrics [phone], [place],   Dental Appointments [phone] or [phone]  [facility]  [place]. [place], [place], Pediatrics, [facility] [place]  [facility]  [address] [place]  [address]  [phone] Fax [phone]  Pediatrics, [facility])   [place] [facility]  [address] [id number]  [phone]   Fax [phone]  Pediatrics, [place]  [id number] [place]. [place]  [facility]   [facility]  [address] [address]  [phone]  Pediatrics, Internal Med, [place], [facility], Dental Clinic [place]  [place] [address]   [phone] [facility]  [place]  [phone]  Fax [phone]  General Medical Clinic  Sliding scale fee  All [place] area     [facility]  [place].  [place], [place] [address]  [facility]  [place], [place] [address]  [facility]   [address]  [place], [place] [address]  [place]  [address]  [place], [place] [address]  [facility]  [phone]  Fax:  [phone]  [facility] Internal Medicine and Pediatrics  [phone]  Fax:  [phone]  [facility]  Resident Practice  [phone]  Fax:  [place]  [phone]  Fax:  [phone]  Orthopaedics  [phone]     [facility] ([place])  [address]  [place], [place] [id number]  [phone]    Cherlynn Perches (Continued)    [facility]   [facility] Source of [place])  [address] [id number]  [place], [place] [address]  [place] of [place]   (formerly [facility])   [address] route [place], [place] [address]  [phone]  [phone] [place]   [facility] Source of [place])  [address] [place], [place] [address]  [phone]       [place]. [facility]  ([place])  [place]. [address] [place] [address]  [place]  (Health Source of [place])  [place]. [address] [place], [place] [address]  [place]  ([place] of [place])  [place], [place] [address]  [phone]   [place] ([place] of [place])  [place], [place] [address]  [phone]    [place], [place], [place]  [phone]  General Family Practice, Pediatrics  Services all areas sliding scale fee     [place] Department  [address] [place]  [address]  [facility], Pediatrics, [place]   ([facility])  [place], [place] [address]  [place] (formerly [place])  [address]  [place], [place] [address]  [place]  [facility] ([place] of [place])  [place].   [place] [facility]  [id number]  Gyn, Prenatal, Dental, Mental, [place]   [phone]

## 2013-07-26 NOTE — ED Notes (Signed)
Pt d/c home with script and home care instructions reviewed all medications with pt and his mother, is mom is driving pt home     Janann ColonelMelissa L Jones, RN  07/26/13 1223

## 2013-07-26 NOTE — ED Notes (Signed)
Bed: B-05  Expected date: 07/26/13  Expected time: 8:02 AM  [facility]  Comments:  Seizure, non-compliance

## 2013-07-26 NOTE — ED Provider Notes (Signed)
[facility] EMERGENCY DEPT  eMERGENCY dEPARTMENT eNCOUnter      Pt Name: Roy Weaver  MRN: [id number]  [place] 01/08/1966  Date of evaluation: 07/26/2013  Provider: Marilynn Rail, MD    [place]       Chief Complaint   Patient presents with   ??? Chest Pain     hx chf   ??? Shortness of Breath     Started last night, hx CHF         HISTORY OF PRESENT ILLNESS  (Location/Symptom, Timing/Onset, Context/Setting, Quality, Duration, Modifying Factors, Severity.)   Roy Weaver is a 28 y.o. male who presents to the emergency department after reportedly having a seizure at home. It's unclear exactly called the ambulance. On arrival here the patient is alert oriented to person but was difficult to obtain an extensive history from. He states he takes his seizure medications regularly which includes 200 mg of Dilantin 2 times daily. He says he gets a seizure every couple of months. He doesn't really recall why this would've occurred today. He is not complaining of pain but feels tired. Does not want any diagnostic testing done such as any blood work.       Nursing Notes were reviewed and I agree.    REVIEW OF SYSTEMS    (2-9 systems for level 4, 10 or more for level 5)     General/Const:  no recent fever    Eyes:  no visual disturbance    ENT:    Cardiovascular:  no chest pain    Pulmonary:  not sure breath    Gastrointestinal:  not nauseous or hungry    GU:    Musculoskeletal:  no complaints of specific extremity injury    Skin:  no lacerations    Endocrine:    Neuro:  no reports of any numbness tingling or weakness    Psychiatric:         Except as noted above the remainder of the review of systems was reviewed and negative.       PAST MEDICAL HISTORY         Diagnosis Date   ??? Diabetes mellitus ([place])    ??? Hypertension    ??? Other disorders of kidney and ureter    ??? CHF (congestive heart failure) ([place]) 12/14/2012   ??? HLD (hyperlipidemia) 02/03/2013       SURGICAL HISTORY           Procedure Laterality Date   ???  Tonsillectomy         CURRENT MEDICATIONS       Previous Medications    AIMSCO ULTRA THIN LANCETS MISC    Check 4 time per day    AMLODIPINE (NORVASC) 10 MG TABLET    Take 1 tablet by mouth daily.    ASPIRIN 81 MG CHEWABLE TABLET    Take 1 tablet by mouth daily.    FUROSEMIDE (LASIX) 40 MG TABLET    Take 1 tablet by mouth daily.    GLUCOSE BLOOD (BLOOD GLUCOSE TEST STRIPS) STRP    Check AC/HS per day    INSULIN GLARGINE (LANTUS) 100 UNIT/ML INJECTION VIAL    Inject 30 Units into the skin nightly.    LABETALOL (NORMODYNE) 300 MG TABLET    Take 2 tablets by mouth every 12 hours.    LISINOPRIL (PRINIVIL;ZESTRIL) 40 MG TABLET    Take 1 tablet by mouth daily.    METFORMIN (GLUCOPHAGE) 1000 MG TABLET  Take 1,000 mg by mouth 2 times daily (with meals).    POTASSIUM CHLORIDE (K-TAB) 10 MEQ TABLET    Take 1 tablet by mouth daily.    SIMVASTATIN (ZOCOR) 20 MG TABLET    Take 0.5 tablets by mouth nightly. Take Monday, Wednesday, Friday       ALLERGIES     Review of patient's allergies indicates no known allergies.    FAMILY HISTORY           Problem Relation Age of Onset   ??? Diabetes Mother    ??? Diabetes Maternal Grandfather    ??? Heart Disease Maternal Grandfather    ??? Heart Disease Father    ??? Heart Disease Paternal Uncle    ??? Heart Disease Paternal Grandfather      Family Status   Relation Status Death Age   ??? Mother Alive    ??? Father Alive         SOCIAL HISTORY      reports that he has never smoked. He has quit using smokeless tobacco. He reports that he drinks alcohol. He reports that he does not use illicit drugs.    PHYSICAL EXAM    (up to 7 for level 4, 8 or more for level 5)   ED Triage Vitals   BP Temp Temp src Pulse Resp SpO2 Height Weight   -- -- -- -- -- -- -- --                 Physical Exam   Constitutional: He is oriented to person, place, and time. He appears well-developed and well-nourished.   HENT:   Head: Normocephalic and atraumatic.   Right Ear: External ear normal.   Left Ear: External ear normal.    Eyes: Right eye exhibits no discharge. Left eye exhibits no discharge.   Neck: Normal range of motion. Neck supple.   Cardiovascular: Normal rate, regular rhythm and normal heart sounds.    No murmur heard.  Pulmonary/Chest: Effort normal and breath sounds normal. No respiratory distress. He has no wheezes.   Abdominal: Soft. He exhibits no distension. There is no tenderness.   Musculoskeletal: Normal range of motion. He exhibits no edema.   Neurological: He is alert and oriented to person, place, and time. He displays normal reflexes. No cranial nerve deficit. He exhibits normal muscle tone. Coordination normal.   Skin: Skin is warm and dry. He is not diaphoretic. No pallor.   Psychiatric: He has a normal mood and affect. His behavior is normal. Judgment and thought content normal.   Nursing note and vitals reviewed.          DIAGNOSTIC RESULTS     RADIOLOGY:   Non-plain film images such as CT, Ultrasound and MRI are read by the radiologist. Plain radiographic images are visualized and preliminarily interpreted by Marilynn Rail, MD with the below findings:        Interpretation per the Radiologist below, if available at the time of this note:         LABS:  Labs Reviewed - No data to display    All other labs were within normal range or not returned as of this dictation.    EMERGENCY DEPARTMENT COURSE and DIFFERENTIAL DIAGNOSIS/MDM:   Vitals:  There were no vitals filed for this visit.    He'll be given is 200 mg of Dilantin and 1 mg of Ativan orally. We plan to watch him in the ED for recurrent seizure. If this  occurs, patient will need lab work done and further observation.      The patient did have a witnessed seizure. His Dilantin level was drawn and it was 7 which is subtherapeutic and his mother states that he was not compliant with his medications. He suffered no trauma as result of the seizure in the department. He has subsequently recovered well and would like to be discharged home. I did load him with  600 mg of Dilantin IV in addition to 300 orally. I'll have him on Ativan 3 times a day for the next couple of days and given outpatient referrals for follow-up and I did recommend he call his neurologist for further care as well.    PROCEDURES:  None    FINAL IMPRESSION    Seizure    DISPOSITION/PLAN   DISPOSITION     PATIENT REFERRED TO:  See Referral list for primary care and call your neurologist for appt    DISCHARGE MEDICATIONS:  New Prescriptions    No medications on file       (Please note that portions of this note were completed with a voice recognition program.  Efforts were made to edit the dictations but occasionally words are mis-transcribed.)    Marilynn Rail, MD  Attending Emergency Physician    Marilynn Rail, MD  07/26/13 1051

## 2013-07-31 NOTE — Progress Notes (Signed)
Subjective:      Patient ID: Roy Weaver is a 28 y.o. male.    HPI Comments: 07/31/13 Patient presents with:  Established New Doctor  Seizures: would like to have referral for a neurologist           Review of Systems   Constitutional: Negative for fever, chills and fatigue.        All vaccinations complete    HENT: Negative.    Eyes: Negative.         Glasses ; Eye ex 2/14    Respiratory: Negative for cough, chest tightness, shortness of breath and wheezing.         Smokes 1 ppd ; Not a heavy drinker ; Marijuana occ     No Asthma    Cardiovascular: Negative.         Dad has HTN    Gastrointestinal: Negative.  Negative for diarrhea, constipation and blood in stool.        No FH of ca colon    Endocrine:        Dad has Diabetes    Genitourinary: Negative for dysuria, urgency and frequency.   Musculoskeletal: Negative.    Skin: Negative for rash.   Neurological: Positive for seizures (9/14 ). Negative for dizziness, tremors, weakness, light-headedness and headaches.   Psychiatric/Behavioral: Negative for behavioral problems and sleep disturbance. The patient is not nervous/anxious.        Objective:   Physical Exam   Constitutional: He is oriented to person, place, and time. He appears well-nourished.   HENT:   Mouth/Throat: No oropharyngeal exudate.   Eyes: Conjunctivae are normal.   Neck: Neck supple.   Cardiovascular: Normal rate, regular rhythm and normal heart sounds.    Pulmonary/Chest: Breath sounds normal.   Abdominal: Soft.   Musculoskeletal: Normal range of motion.   Neurological: He is alert and oriented to person, place, and time.   Skin: Skin is warm.   Psychiatric: He has a normal mood and affect. His behavior is normal.       Assessment:     Roy Weaver was seen today for established new doctor and seizures.    Diagnoses and associated orders for this visit:    Seizure disorder [facility](HCC)  - [facility] - Contractor, Angelina PihZainab, MD     Smoker  . Cautioned strongly                  Plan:

## 2013-08-06 LAB — PHENYTOIN LEVEL, TOTAL: Phenytoin Lvl: 16 ug/mL (ref 10.0–20.0)

## 2013-08-06 NOTE — Patient Instructions (Signed)
Please call with any questions or concerns:   [facility]  @ [phone].    LAB RESULTS:  Please obtain any labs or diagnostic tests as discussed today.   You may call the office to check the results.  Please allow  3 to 7 days for us to get these results.    MEDICATION LIST:  Please bring an accurate list of your medications to every visit.    APPOINTMENT CONFIRMATION:  We will call you the day before your scheduled appointment to confirm.   If we are unable to reach you, you MUST call back by the end of the day to confirm the appointment or we may be forced to cancel.           Stopping Smoking: After Your Visit  Your Care Instructions  Cigarette smokers crave the nicotine in cigarettes. Giving it up is much harder than simply changing a habit. Your body has to stop craving the nicotine. It is hard to quit, but you can do it. There are many tools that people use to quit smoking. You may find that combining tools works best for you.  There are several steps to quitting. First you get ready to quit. Then you get support to help you. After that, you learn new skills and behaviors to become a nonsmoker. For many people, a necessary step is getting and using medicine.  Your doctor will help you set up the plan that best meets your needs. You may want to attend a smoking cessation program to help you quit smoking. When you choose a program, look for one that has proven success. Ask your doctor for ideas. You will greatly increase your chances of success if you take medicine as well as get counseling or join a cessation program.  Some of the changes you feel when you first quit tobacco are uncomfortable. Your body will miss the nicotine at first, and you may feel short-tempered and grumpy. You may have trouble sleeping or concentrating. Medicine can help you deal with these symptoms. You may struggle with changing your smoking habits and rituals. The last step is the tricky one: Be  prepared for the smoking urge to continue for a time. This is a lot to deal with, but keep at it. You will feel better.  Follow-up care is a key part of your treatment and safety. Be sure to make and go to all appointments, and call your doctor if you are having problems. It???s also a good idea to know your test results and keep a list of the medicines you take.  How can you care for yourself at home?  ?? Ask your family, friends, and coworkers for support. You have a better chance of quitting if you have help and support.  ?? Join a support group, such as Nicotine Anonymous, for people who are trying to quit smoking.  ?? Consider signing up for a smoking cessation program, such as the American Lung Association's Freedom from Smoking program.  ?? Set a quit date. Pick your date carefully so that it is not right in the middle of a big deadline or stressful time. Once you quit, do not even take a puff. Get rid of all ashtrays and lighters after your last cigarette. Clean your house and your clothes so that they do not smell of smoke.  ?? Learn how to be a nonsmoker. Think about ways you can avoid those things that make you reach for a cigarette.  ??   Avoid situations that put you at greatest risk for smoking. For some people, it is hard to have a drink with friends without smoking. For others, they might skip a coffee break with coworkers who smoke.  ?? Change your daily routine. Take a different route to work or eat a meal in a different place.  ?? Cut down on stress. Calm yourself or release tension by doing an activity you enjoy, such as reading a book, taking a hot bath, or gardening.  ?? Talk to your doctor or pharmacist about nicotine replacement therapy, which replaces the nicotine in your body. You still get nicotine but you do not use tobacco. Nicotine replacement products help you slowly reduce the amount of nicotine you need. These products come in several forms, many of them available over-the-counter:  ?? Nicotine  patches  ?? Nicotine gum and lozenges  ?? Nicotine inhaler  ?? Ask your doctor about bupropion (Wellbutrin) or varenicline (Chantix), which are prescription medicines. They do not contain nicotine. They help you by reducing withdrawal symptoms, such as stress and anxiety.  ?? Some people find hypnosis, acupuncture, and massage helpful for ending the smoking habit.  ?? Eat a healthy diet and get regular exercise. Having healthy habits will help your body move past its craving for nicotine.  ?? Be prepared to keep trying. Most people are not successful the first few times they try to quit. Do not get mad at yourself if you smoke again. Make a list of things you learned and think about when you want to try again, such as next week, next month, or next year.   Where can you learn more?   Go to [email or web address] and sign in to your MyChart account. Enter Y522 in the Search Health Information box to learn more about ???Stopping Smoking: After Your Visit.???    If you do not have an account, please click on the ???Sign Up Now??? link.     ?? 2006-2015 [employer]. Care instructions adapted under license by [facility]. This care instruction is for use with your licensed healthcare professional. If you have questions about a medical condition or this instruction, always ask your healthcare professional. Healthwise, Incorporated disclaims any warranty or liability for your use of this information.  Content Version: 10.4.390249; Current as of: January 30, 2013

## 2013-08-06 NOTE — Progress Notes (Signed)
NEUROLOGY CONSULTATION     Chief Complaint   Patient presents with   . Seizures     Patient is here today for seizures. He says that about 4 months ago he had a new onset seizure. He had a seizure last week.       HISTORY OF PRESENT ILLNESS :    Roy Weaver is a 28 y.o. male who is referred by Dr. Zola ButtonHaq   History was obtained from patient  Patient was referred for evaluation of seizures.  Patient states that he started having seizures in 2013.  I reviewed the medical records available on Epic.  Patient had [facility]  Patient is currently on Dilantin 200 mg capsules, 2 capsules daily for a total of 400 mg.  Patient states that when he takes it regularly sees seizures are fairly well-controlled.  Patient states that he would usually get a warning with the left eye watering and then would have a generalized tonic-clonic seizure  Patient has tongue and lip biting during the seizure.  He lost bladder control one time.  Seizure onset is usually acute and abrupt and is moderately severe without any clear aggravating or relieving factors  Patient's last seizure was last week when he did not take his medications on time.        REVIEW OF SYSTEMS    Constitutional:  []    Chills   []   Fatigue   []   Fevers   []   Malaise   []   Weight loss     [x]  Denies all of the above    Eyes:  []   Double vision   []   Blurry vision     [x]  Denies all of the above    Ears, nose, mouth, throat, and face:   []  Hearing loss    []    Hoarseness      []   Snoring    []   Tinnitus       [x]  Denies all of the above     Respiratory:   []   Cough    []   Shortness of breath         [x]  Denies all of the above     Cardiovascular:   []   Chest pain    []   Exertional chest pressure/discomfort           []  Palpitations    []   Syncope     [x]  Denies all of the above    Gastrointestinal:   []   Abdominal  pain   []   Constipation    []   Diarrhea    []    Dysphagia                      [x]  Denies all of the above    Genitourinary:      []   Frequency   []   Hematuria     []   Urinary incontinence           [x]  Denies all of the above     Hematologic/lymphatic:  []   Bleeding    []   Easy bruising   []   Anemia  [x]  Denies all of the above     Musculoskeletal:   []  Back pain       []   Myalgias    []   Neck pain           [x]  Denies all of the  above    Neurological: As noted in HPI    Behavioral/Psych:   []  Anxiety    []   Depression     [x]   Mood swings     []  Denies all of the above     Endocrine:   []   Temperature intolerance     []  Fatigue      [x]  Denies all of the above     Allergic/Immunologic:   []  Hay fever    [x]  Denies all of the above     Past Medical History   Diagnosis Date   . Seizures (HCC)    . Migraine      Family History   Problem Relation Age of Onset   . Diabetes Father      History     Social History   . Marital Status: Single     Spouse Name: N/A     Number of Children: N/A   . Years of Education: N/A     Social History Main Topics   . Smoking status: Current Every Day Smoker -- 0.50 packs/day for 2 years     Types: Cigarettes   . Smokeless tobacco: Never Used   . Alcohol Use: No      Comment: stopped drinking   . Drug Use: Yes     Special: Marijuana      Comment: once per week as of 08/06/13   . Sexual Activity: None     Other Topics Concern   . None     Social History Narrative    ** Merged History Encounter **        PHYSICAL EXAMINATION:  BP 110/65   Pulse 75   Ht 6\' 4"  (1.93 m)   Wt 182 lb (82.555 kg)   BMI 22.16 kg/m2     Appearance: Well appearing, well nourished and in no distress  Mental Status Exam: Patient is alert, oriented to person, place and time.   Recent and remote memory is normal  Fund of Knowledge is normal  Attention/concentration is normal.   Speech : No dysarthria  Language : No aphasia  Funduscopic Exam: sharp disc margins  Cranial Nerves:   II: Visual fields:  Full to  confrontation  III: Pupils:  equal, round, reactive to light  III,IV,VI: Extra Ocular Movements are intact. No nystagmus  V: Facial sensation is intact to pin prick and light touch  VII: Facial strength and movements: intact and symmetric smile,cheek puffing and eyebrow elevation  VIII: Hearing:  Intact to finger rub bilaterally  IX: Palate  elevation is symmetric  XI: Shoulder shrug is intact  XII: Tongue movements are normal  Motor:  Muscle tone and bulk are normal.   Strength is symmetrical 5/5 in all four extremities.  Sensory: Intact to light touch and  pin prick in all four extremities  Coordination:  Normal  Finger to Nose and Heel to Shin bilaterally    .Reflexes:  DTR +2 and symmetric bilaterally  Plantar response: Flexor bilaterally  Gait: Gait and station is normal. Patient can toe/ heel and tandem walk without difficulty  Romberg: negative  Vascular: No carotid bruit bilaterally        DATA:  LABS:  General Labs:    CBC:   Lab Results   Component Value Date    WBC 3.6 06/30/2013    RBC 4.37 06/30/2013    HGB 13.4 06/30/2013    HCT 39.4 06/30/2013    MCV 90.0 06/30/2013  MCH 30.6 06/30/2013    MCHC 34.0 06/30/2013    RDW 13.0 06/30/2013    PLT 211 06/30/2013    MPV 7.1 06/30/2013     BMP:    Lab Results   Component Value Date    NA 141 06/30/2013    K 4.1 06/30/2013    CL 101 06/30/2013    CO2 27 06/30/2013    BUN 9 06/30/2013    LABALBU 4.4 06/30/2013    CREATININE 0.8 06/30/2013    CREATININE 1.0 08/17/2011    CALCIUM 9.5 06/30/2013    GFRAA >60 06/30/2013    GFRAA 157 08/19/2011    LABGLOM >60 06/30/2013    GLUCOSE 111 06/30/2013     RADIOLOGY REVIEW:  I have reviewed radiology image(s) and reports(s) of:  MRI brain    IMPRESSION :  Intractable partial complex seizures  The seizures are fairly well-controlled when patient takes his medications on time  Occasional noncompliance with medication      RECOMMENDATIONS :  Continue Dilantin 200 mg, 2 capsules for a total of 400 mg at night  We'll get a Dilantin level today and adjust the dose as  necessary  We'll get an EEG  Driving restrictions were explained to the patient  Return in 2 months+        Please note a portion of this chart was generated using dragon dictation software. Although every effort was made to ensure the accuracy of this automated transcription, some errors in transcription may have occurred.

## 2013-08-07 MED ORDER — PHENYTOIN SODIUM EXTENDED 200 MG PO CAPS
200 MG | ORAL_CAPSULE | Freq: Every evening | ORAL | Status: DC
Start: 2013-08-07 — End: 2013-09-15

## 2013-08-23 ENCOUNTER — Inpatient Hospital Stay: Admit: 2013-08-23 | Discharge: 2013-08-24 | Attending: Emergency Medicine

## 2013-08-23 ENCOUNTER — Inpatient Hospital Stay
Admit: 2013-08-23 | Discharge: 2013-08-23 | Disposition: A | Discharge status: Home / Self Care | Attending: Emergency Medicine

## 2013-08-23 LAB — CBC
Hematocrit: 41 % (ref 40.5–52.5)
Hemoglobin: 14.1 g/dL (ref 13.5–17.5)
MCH: 31.8 pg (ref 26.0–34.0)
MCHC: 34.5 g/dL (ref 31.0–36.0)
MCV: 92.2 fL (ref 80.0–100.0)
MPV: 7.1 fL (ref 5.0–10.5)
Platelets: 184 10*3/uL (ref 135–450)
RBC: 4.45 M/uL (ref 4.20–5.90)
RDW: 13.1 % (ref 12.4–15.4)
WBC: 4.9 10*3/uL (ref 4.0–11.0)

## 2013-08-23 LAB — PHENYTOIN LEVEL, TOTAL: Phenytoin Lvl: 17.9 ug/mL (ref 10.0–20.0)

## 2013-08-23 LAB — COMPREHENSIVE METABOLIC PANEL
ALT: 13 U/L (ref 10–40)
AST: 18 U/L (ref 15–37)
Albumin/Globulin Ratio: 1.1 (ref 1.1–2.2)
Albumin: 3.8 g/dL (ref 3.4–5.0)
Alkaline Phosphatase: 113 U/L (ref 40–129)
Anion Gap: 14 (ref 3–16)
BUN: 9 mg/dL (ref 7–20)
CO2: 25 mmol/L (ref 21–32)
Calcium: 8.8 mg/dL (ref 8.3–10.6)
Chloride: 102 mmol/L (ref 99–110)
Creatinine: 0.7 mg/dL — ABNORMAL LOW (ref 0.9–1.3)
GFR African American: >60 (ref >60)
GFR Non-African American: >60 (ref >60)
Globulin: 3.5 g/dL
Glucose: 89 mg/dL (ref 70–99)
Potassium: 4.2 mmol/L (ref 3.5–5.1)
Sodium: 141 mmol/L (ref 136–145)
Total Bilirubin: <0.2 mg/dL (ref 0.0–1.0)
Total Protein: 7.3 g/dL (ref 6.4–8.2)

## 2013-08-23 MED ADMIN — LORazepam (ATIVAN) injection 1 mg: 1 mg | INTRAVENOUS | @ 22:00:00 | NDC 00641604401

## 2013-08-23 MED FILL — LORAZEPAM 2 MG/ML IJ SOLN: 2 MG/ML | INTRAMUSCULAR | Qty: 1

## 2013-08-23 NOTE — ED Notes (Signed)
Dr. Gayleen OremPitstick in room discussing pt's refusal to stay the night.     Londell Mohebecca A Cecila Satcher, RN  08/23/13 [phone]

## 2013-08-23 NOTE — ED Notes (Signed)
Pt deciding to leave AMA. Pt states " has kids he needs to watch and no one is available to watch them". AMA form signed by pt. Discharge instructions and medications reviewed with pt. Pt verbalized understanding. No signs of acute distress noted. Skin warm and dry. Respirations even and easy. Follow-up care discussed-return to ED if symptoms worsen.     Vladimir Crofts, RN  08/23/13 [phone]

## 2013-08-23 NOTE — ED Notes (Signed)
Patient presents to the ED sp witnessed seizure at home.  On ED arrival patient is alert and oriented, states he has been forgetting to take his dilantin.    Cordelia Penmanda Marie Anastasha Ortez, RN  08/23/13 1440

## 2013-08-23 NOTE — ED Provider Notes (Signed)
[facility] EMERGENCY DEPT  eMERGENCY dEPARTMENT eNCOUnter      Pt Name: Roy Weaver  MRN: [id number]  [place] July 27, 1985  Date of evaluation: 08/23/2013  Provider: Rise Mu, PA-C    CHIEF COMPLAINT       Chief Complaint   Patient presents with   ??? Seizures     Patient was just discharged from here.  Patient walked to the bus stop and had a seizure at the bus stop.        CRITICAL CARE TIME   Total Critical Care time was 0 minutes, excluding separately reportable procedures.  There was a high probability of clinically significant/life threatening deterioration in the patient's condition which required my urgent intervention.        HISTORY OF PRESENT ILLNESS  (Location/Symptom, Timing/Onset, Context/Setting, Quality, Duration, Modifying Factors, Severity.)   Roy Weaver is a 28 y.o. male  Pertinent past medical history of seizure disorder who presents to the emergency department With complaint of seizure. Patient states he exited this emergency department after evaluation was waiting at the bus stop when he had a seizure. Patient states that he remembers exiting the emergency department but does not remember prior to having his seizure, he states the first thing he does remember is being back in the emergency department. Patient states that in the emergency department he has no pain states his pain is zero out of 10 pain scale. He denies any neck pain or stiffness denies headache denies any visual disturbance numbness, weakness, confusion or slurred speech. He denies any recent illness, denies any cough, fever nausea vomiting or abdominal pain. He is on Dilantin for seizure control. He states that when his Dilantin level was measured earlier today in the emergency department it was at a therapeutic range. Patient states that last night he did drink mango juice when he took his Dilantin and he states that he is not supposed to drink any citrus juice when taking Dilantin. Admits to using marijuana but  denies any other drug use      Nursing Notes were reviewed and I agree.    REVIEW OF SYSTEMS    (2-9 systems for level 4, 10 or more for level 5)     Review of Systems   Constitutional: Negative for fever.   Respiratory: Negative for cough, chest tightness and shortness of breath.    Cardiovascular: Negative for chest pain.   Gastrointestinal: Negative for nausea, vomiting and abdominal pain.   Genitourinary: Negative for dysuria.   Musculoskeletal: Negative for back pain, arthralgias, gait problem, neck pain and neck stiffness.   Skin: Negative for color change, pallor and rash.   Neurological: Positive for seizures. Negative for dizziness, weakness, light-headedness, numbness and headaches.   All other systems reviewed and are negative.      Except as noted above the remainder of the review of systems was reviewed and negative.       PAST MEDICAL HISTORY         Diagnosis Date   ??? Seizures ([place])    ??? Migraine        SURGICAL HISTORY     History reviewed. No pertinent past surgical history.    CURRENT MEDICATIONS       Previous Medications    PHENYTOIN (DILANTIN) 200 MG ER CAPSULE    Take 2 capsules by mouth nightly.       ALLERGIES     Review of patient's allergies indicates no known allergies.  FAMILY HISTORY           Problem Relation Age of Onset   ??? Diabetes Father      Family Status   Relation Status Death Age   ??? Father Alive    ??? Mother Alive         SOCIAL HISTORY      reports that he has been smoking Cigarettes.  He has a 1 pack-year smoking history. He has never used smokeless tobacco. He reports that he does not drink alcohol or use illicit drugs.    PHYSICAL EXAM    (up to 7 for level 4, 8 or more for level 5)   ED Triage Vitals   BP Temp Temp Source Pulse Resp SpO2 Height Weight   08/23/13 1756 08/23/13 1756 08/23/13 1756 08/23/13 1756 08/23/13 1756 08/23/13 1756 08/23/13 1756 08/23/13 1756   134/80 mmHg 99.5 ??F (37.5 ??C) Oral 97 17 97 % 6' 4"$  (1.93 m) 180 lb (81.647 kg)       Physical Exam    Constitutional: He is oriented to person, place, and time. Vital signs are normal. He appears well-developed.  Non-toxic appearance.   HENT:   Head: Normocephalic and atraumatic.   Right Ear: Tympanic membrane, external ear and ear canal normal.   Left Ear: Tympanic membrane, external ear and ear canal normal.   Nose: Nose normal.   Mouth/Throat: Uvula is midline, oropharynx is clear and moist and mucous membranes are normal. No lacerations. No oropharyngeal exudate.   Eyes: Conjunctivae and EOM are normal. Pupils are equal, round, and reactive to light.   Neck: Normal range of motion. Neck supple.   No meningismus    Cardiovascular: Normal rate, regular rhythm and intact distal pulses.    Pulmonary/Chest: Effort normal and breath sounds normal.   Abdominal: Soft. Bowel sounds are normal. There is no tenderness.   Musculoskeletal: Normal range of motion.   Bilateral upper and lower extremities exhibit neurovascular status is intact, with normal sensation, motor function, pulses and capillary refill. No signs of circulatory compromise or neurologic injury.       Neurological: He is alert and oriented to person, place, and time.   No focal neurologic deficit on exam.  Cranial nerves II through XII intact.  No pronator drift.  Finger to nose and heel to shin test show no deficit.   Bilateral 5/5 strength in upper and lower extremities.  Patient is able to ambulate without difficulty.       Skin: Skin is warm and dry.   Psychiatric: He has a normal mood and affect. His behavior is normal.   Nursing note and vitals reviewed.       DIFFERENTIAL DIAGNOSIS   Electrolyte abnormality, Meningitis, Head injury, Drug overdose, Drug withdrawl     DIAGNOSTIC RESULTS     EKG: All EKG's are interpreted by Rise Mu, PA-C in the absence of a cardiologist.    NONE    RADIOLOGY:   Non-plain film images such as CT, Ultrasound and MRI are read by the radiologist. Plain radiographic images are visualized and preliminarily interpreted  by Rise Mu, PA-C with the below findings:    NONE    Interpretation per the Radiologist below, if available at the time of this note:    CT HEAD WO CONTRAST    Final Result: IMPRESSION:      LEFT MAXILLARY SINUS DISEASE    NO ACUTE INTRACRANIAL ABNORMALITY.  These findings were reported to the Emergency Room as per    protocol.         LABS:  Labs Reviewed - No data to display   Patient had laboratory work up earlier today during first evaluation    All other labs were within normal range or not returned as of this dictation.    EMERGENCY DEPARTMENT COURSE and DIFFERENTIAL DIAGNOSIS/MDM:   Vitals:    Filed Vitals:    08/23/13 1859 08/23/13 2001 08/23/13 2059 08/23/13 2115   BP: 133/77 139/71 133/72 129/75   Pulse: 92 91 100 92   Temp:       TempSrc:       Resp: 14 17 23 14   $ Height:       Weight:       SpO2: 100% 100%  100%     Medications   LORazepam (ATIVAN) injection 1 mg (1 mg Intravenous Given 08/23/13 1813)   levetiracetam (KEPPRA) 1000 mg/100 mL IVPB (1,000 mg Intravenous New Bag 08/23/13 2110)       The patient was evaluated by Dr. Libby Maw and myself. On examination he is awake, alert, oriented to person place and time. No focal neurologic deficits on examination. I reviewed the patient's past medical chart as well as spoke with the attending physician who treated him earlier today. According to the attending physician at that time she did speak to the patient's neurologist who recommended starting an extra anti-seizure medication if the patient were to seize again in the emergency department. The patient had no seizures during his first emergency department stay therefore he was discharged on his normal Dilantin dose. Patient exited the emergency department had a second seizure at the bus stop therefore return to the emergency department for reevaluation. A CT of his head shows no acute abnormality at this time. I reviewed his labs from earlier today which showed no acute abnormalities no  electrolyte imbalance and therapeutic dilantin level. Marland Kitchen He denies any recent sickness or any complaints today. I spoke again with his neurologist Dr. Sunny Schlein who at this point recommends admitting the patient for overnight observation and administering a 1000 mg bolus of Keppra Then at discharge discharging the patient on a dose of 500 mg Keppra twice a day. 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  Therefore we will administer 1000 mg of Keppra IV to the patient, we'll discharge him with the recommended dose of keppra per Dr. Sunny Schlein.  The patient is awake, alert, oriented to person place and time, able to make medical decisions.    The patient has decided to leave against medical advice, because he needs to get home to his children.    He has normal mental status and adequate capacity to make medical decisions.    The patient refuses hospital admission and wants to be discharged.    The risks have been explained to the patient, including worsening illness, chronic pain, permanent disability, and death.    The benefits of admission have also been explained, including the availability and proximity of nurses, physicians, monitoring, diagnostic testing, and treatment.    The patient was able to understand and state the risks and benefits of hospital admission.  This was witnessed by nurse Wells Guiles and me.    He had the opportunity to  ask questions about his medical condition.    The patient was treated to the extent that he would allow, and knows that he may return for care at any time.    Patient will call his neurologist in the morning to establish follow up         This patient was seen by the attending physician and they agreed with the assessment and plan.    CONSULTS:  IP CONSULT TO NEUROLOGY    PROCEDURES:  None    FINAL IMPRESSION      1. Seizure  disorder [facility])          DISPOSITION/PLAN   DISPOSITION AMA    PATIENT REFERRED TO:  Richardo Hanks, MD  [address] [place]  [place] [address]  [phone]    Schedule an appointment as soon as possible for a visit  call tomorrow     [place], MD  [address]  [place] [address]    Schedule an appointment as soon as possible for a visit      [facility]  [address]  [place] [address]  [phone]    If symptoms worsen, temperature greater than 101, increase in pain, any other concern       DISCHARGE MEDICATIONS:  New Prescriptions    LEVETIRACETAM (KEPPRA) 500 MG TABLET    Take 1 tablet by mouth 2 times daily.       (Please note that portions of this note were completed with a voice recognition program.  Efforts were made to edit the dictations but occasionally words are mis-transcribed.)    Rise Mu, PA-C        Rise Mu, PA-C  08/23/13 2130

## 2013-08-23 NOTE — ED Notes (Signed)
Bed: C-30  Expected date: 08/23/13  Expected time: 2:32 PM  Means [facility] arrival: [facility]  Comments:  [age] M poss seizure

## 2013-08-23 NOTE — Discharge Instructions (Signed)
Epilepsy: After Your Visit  Your Care Instructions  Epilepsy is a common condition that causes repeated seizures. The seizures are caused by bursts of electrical activity in the brain that aren't normal. Seizures may cause problems with muscle control, movement, speech, vision, or awareness. They can be scary.  Epilepsy affects each person differently. Some people have only a few seizures. Others get them more often. If you know what triggers a seizure, you may be able to avoid having one.  You can take medicines to control and reduce seizures. You and your doctor will need to find the right combination, schedule, and dose of medicine. This may take time and careful changes.  Seizures may get worse and happen more often over time.  Follow-up care is a key part of your treatment and safety. Be sure to make and go to all appointments, and call your doctor if you are having problems. It's also a good idea to know your test results and keep a list of the medicines you take.  How can you care for yourself at home?   Be safe with medicines. Take your medicines exactly as prescribed. Call your doctor if you think you are having a problem with your medicine.   Make a treatment plan with your doctor. Be sure to follow your plan.   Try to identify and avoid things that may make you more likely to have a seizure. These may include:   Not getting enough sleep.   Using drugs or alcohol.   Being emotionally stressed.   Skipping meals.   Keep a record of any seizures you have. Note the date, time of day, and any details about the seizure that you can remember. Your doctor can use this information to plan or adjust your medicine or other treatment.   Be sure that any doctor treating you for another condition knows that you have epilepsy. Each doctor should know what medicines you are taking, if any.   Wear a medical ID bracelet. You can buy this at most drugstores. If you have a seizure that leaves you unconscious or  unable to speak for yourself, this bracelet will let those who are treating you know that you have epilepsy.   Talk to your doctor about whether it is safe for you to do certain activities, such as drive or swim.  When should you call for help?  Call 911 anytime you think you may need emergency care. For example, call if:   A seizure does not stop as it normally does.   You have new symptoms such as:   Numbness, tingling, or weakness on one side of your body or face.   Vision changes.   Trouble speaking or thinking clearly.  Call your doctor now or seek immediate medical care if:   You have a fever.   You have a severe headache.  Watch closely for changes in your health, and be sure to contact your doctor if:   The normal pattern or features of your seizures change.   Where can you learn more?   Go to [email or web address] and sign in to your MyChart account. Enter X141 in the Search Health Information box to learn more about "Epilepsy: After Your Visit."    If you do not have an account, please click on the "Sign Up Now" link.      2006-2015 [employer]. Care instructions adapted under license by [facility]. This care instruction is for use with your licensed healthcare professional. If   you have questions about a medical condition or this instruction, always ask your healthcare professional. Healthwise, Incorporated disclaims any warranty or liability for your use of this information.  Content Version: 10.4.390249; Current as of: August 02, 2012

## 2013-08-23 NOTE — ED Provider Notes (Signed)
[facility] EMERGENCY DEPT  eMERGENCY dEPARTMENT eNCOUnter      Pt Name: Roy Weaver  MRN: [id number]  Birthdate 17-Oct-1985  Date of evaluation: 08/23/2013  Provider: Lovenia Shuck, MD    CHIEF COMPLAINT       Chief Complaint   Patient presents with   ??? Seizures         HISTORY OF PRESENT ILLNESS  (Location/Symptom, Timing/Onset, Context/Setting, Quality, Duration, Modifying Factors, Severity.)   Roy Weaver is a 28 y.o. male who presents to the emergency department seizures. Patient has a history seizures take Dilantin 200 mg twice a day for it. last night he was not watching what he was eating and drink some mango juice prior to taking his Dilantin, she say he is not felt to have any citrus in an hour of taking his Dilantin.  And today he was at work when he started feeling a headache and some pain on his right thigh afterward he remember was being in the ER. EMS said when  they came he was post ictal.  no further seizures when I evaluate him patient is alert oriented answered questions appropriately and denies any pain. Had bitten his tongue but no loss of bowel or bladder      Nursing Notes were reviewed and I agree.    REVIEW OF SYSTEMS    (2-9 systems for level 4, 10 or more for level 5)     Review of Systems   Constitutional: Negative for fever and chills.   HENT: Positive for mouth sores. Negative for hearing loss and voice change.    Eyes: Negative for discharge and visual disturbance.   Respiratory: Negative for chest tightness and shortness of breath.    Cardiovascular: Negative for chest pain, palpitations and leg swelling.   Gastrointestinal: Negative for nausea, vomiting and abdominal pain.   Genitourinary: Negative for difficulty urinating.   Musculoskeletal: Negative for myalgias, arthralgias, neck pain and neck stiffness.   Skin: Negative for wound.   Neurological: Positive for seizures and headaches. Negative for weakness and numbness.   Hematological: Does not bruise/bleed easily.    Psychiatric/Behavioral: Negative for agitation.        Except as noted above the remainder of the review of systems was reviewed and negative.       PAST MEDICAL HISTORY         Diagnosis Date   ??? Seizures ([facility])    ??? Migraine        SURGICAL HISTORY     No past surgical history on file.    CURRENT MEDICATIONS       Previous Medications    PHENYTOIN (DILANTIN) 200 MG ER CAPSULE    Take 2 capsules by mouth nightly.       ALLERGIES     Review of patient's allergies indicates no known allergies.    FAMILY HISTORY           Problem Relation Age of Onset   ??? Diabetes Father      Family Status   Relation Status Death Age   ??? Father Alive    ??? Mother Alive         SOCIAL HISTORY      reports that he has been smoking Cigarettes.  He has a 1 pack-year smoking history. He has never used smokeless tobacco. He reports that he uses illicit drugs (Marijuana). He reports that he does not drink alcohol.    PHYSICAL EXAM    (up to  7 for level 4, 8 or more for level 5)   ED Triage Vitals   BP Temp Temp Source Pulse Resp SpO2 Height Weight   08/23/13 1439 08/23/13 1439 08/23/13 1439 08/23/13 1439 08/23/13 1439 08/23/13 1439 08/23/13 1439 08/23/13 1439   126/79 mmHg 98 ??F (36.7 ??C) Oral 98 14 100 % 6\' 4"  (1.93 m) 182 lb (82.555 kg)       Physical Exam   Constitutional: He is oriented to person, place, and time. He appears well-developed and well-nourished. No distress.   HENT:   Head: Normocephalic and atraumatic.   Mouth/Throat: Oropharynx is clear and moist.   Bit the left side of his tongue   Eyes: EOM are normal. Pupils are equal, round, and reactive to light.   Neck: Normal range of motion. Neck supple.   Cardiovascular: Normal rate, regular rhythm and normal heart sounds.  Exam reveals no gallop and no friction rub.    No murmur heard.  Pulmonary/Chest: Effort normal and breath sounds normal. No respiratory distress. He has no wheezes. He exhibits no tenderness.   Abdominal: Soft. Bowel sounds are normal. He exhibits no  distension. There is no tenderness. There is no rebound and no guarding.   Musculoskeletal: Normal range of motion. He exhibits no edema or tenderness.   No deformity of arms or legs moving his shoulders and legs and everything without any problems no weakness or numbness.   Neurological: He is alert and oriented to person, place, and time. No cranial nerve deficit. He exhibits normal muscle tone. Coordination normal.   Skin: Skin is warm and dry. No rash noted. No erythema. No pallor.   Psychiatric: He has a normal mood and affect. His behavior is normal. Judgment normal.           DIAGNOSTIC RESULTS     RADIOLOGY:   Non-plain film images such as CT, Ultrasound and MRI are read by the radiologist. Plain radiographic images are visualized and preliminarily interpreted by Lovenia Shuck, MD with the below findings:        Interpretation per the Radiologist below, if available at the time of this note:         LABS:  Labs Reviewed   COMPREHENSIVE METABOLIC PANEL - Abnormal; Notable for the following:     CREATININE 0.7 (*)     All other components within normal limits   CBC   PHENYTOIN LEVEL, TOTAL       All other labs were within normal range or not returned as of this dictation.    EMERGENCY DEPARTMENT COURSE and DIFFERENTIAL DIAGNOSIS/MDM:   Vitals:    Filed Vitals:    08/23/13 1439   BP: 126/79   Pulse: 98   Temp: 98 ??F (36.7 ??C)   TempSrc: Oral   Resp: 14   Height: 6\' 4"  (1.93 m)   Weight: 182 lb (82.555 kg)   SpO2: 100%       Patient was seizures has a history of recurrent seizures is on Dilantin for it spoke with Dr. Glean Hess, did not want change anything at this time have the patient call him for follow-up. If he has another seizure can consider adding another antiepileptic medication at this time will hold it.  Have the patient follow-up with him in 3-4 weeks.  Patient has been due well the whole time here no signs of distress alert oriented answering questions appropriately no complaint of any  pain.    PROCEDURES:  None  FINAL IMPRESSION      1. Recurrent seizures [facility](HCC)          DISPOSITION/PLAN   DISPOSITION Decision to Discharge    PATIENT REFERRED TO:  Salvadore FarberVijay Rajan, MD  [address], [address]  [place] [id number]  [phone]      please also for follow-up within 2-3 weeks      DISCHARGE MEDICATIONS:  New Prescriptions    No medications on file       (Please note that portions of this note were completed with a voice recognition program.  Efforts were made to edit the dictations but occasionally words are mis-transcribed.)    Lovenia ShuckKimthuy T Pearce Littlefield, MD  Attending Emergency Physician      Lovenia ShuckKimthuy T Khushboo Chuck, MD  08/23/13 [phone]

## 2013-08-23 NOTE — ED Notes (Signed)
Pt to ct scan. No signs of acute distress noted. Will continue monitor when returns.     Londell Mohebecca A Dixie Coppa, RN  08/23/13 [phone]

## 2013-08-23 NOTE — ED Notes (Signed)
Pt back from CT scan. No signs of acute distress. Will continue to monitor.    Londell Mohebecca A Cletis Clack, RN  08/23/13 [phone]

## 2013-08-23 NOTE — ED Notes (Signed)
Verdene LennertEmily G. PA in room updating pt. Pt refuses to stay for night despite MD's recommendations. Dr. Gayleen OremPitstick notified.    Londell Mohebecca A Armanii Urbanik, RN  08/23/13 [phone]

## 2013-08-23 NOTE — ED Provider Notes (Signed)
.  1PREMIERSHAREDVISIT  Patient seen and examined in conjunction with mid-level provider. Findings reviewed and I agree. My face-to-face evaluation reveals a patient with recurrent seizure. He was seen here and evaluated earlier today. His Dilantin level was therapeutic. Laboratory evaluation was normal. He had a breakthrough seizure. We discussed the case with his neurologist. He seemed to be stable for discharge and was released. He was discharged in emergency room was waiting for the bus and had another seizure. On repeat presentation is alert he is oriented and nonfocal exam afebrile. There is no significant head or neck injury. He denies numbness tingling or weakness. There is no fevers chills or headache. No chest pain or abdominal pain. CT scan of the head was done this time and is normal. He was given a dose of Keppra. We discussed the case with his neurologist who did recommend adding a second agent as well as an admission. The patient is refusing to stay in the hospital citing the fact that he has to care for son at home is a single father. We did explain his risk of recurrent seizure and death. He expressed understanding of plan. He is going to sign out AMA. We are going to write him a prescription for Keppra. He is going to follow-up with his neurologist.    Jeral Fruit, DO  08/23/13 2128

## 2013-08-23 NOTE — ED Notes (Signed)
Bed: C-27  Expected date: 08/23/13  Expected time: 5:44 PM  [facility]  Comments:  Seizure/just left here

## 2013-08-24 MED ORDER — LEVETIRACETAM 500 MG PO TABS
500 MG | ORAL_TABLET | Freq: Two times a day (BID) | ORAL | Status: DC
Start: 2013-08-24 — End: 2013-09-15

## 2013-08-24 MED ADMIN — levetiracetam (KEPPRA) 1000 mg/100 mL IVPB: 1000 mg | INTRAVENOUS | @ 01:00:00 | NDC 67457026500

## 2013-08-24 MED FILL — LEVETIRACETAM IN NACL 1000 MG/100ML IV SOLN: 1000 MG/100ML | INTRAVENOUS | Qty: 100

## 2013-08-29 NOTE — Telephone Encounter (Signed)
Apparently patient in ER 08/23/13 for seizure.   He wanted Dr. Glean Hessajan to know that he found out why he is having seizures.  He said he was not getting enough sleep, but now that he knows the reason for the seizures and he is getting to bed early and not being woken up during the night, he has not had any seizures.    He mentioned that he may need note for work????  He states he works at [employer] on [address].   He wanted to see if Dr. Glean Hessajan wanted to see him sooner than May 2015. Said the ER tried to change his medication, but I think he said he did not change to their recommendations (had some difficulty understanding patient).

## 2013-08-30 MED ORDER — NICOTINE 21 MG/24HR TD PT24
21 MG/24HR | MEDICATED_PATCH | TRANSDERMAL | Status: DC
Start: 2013-08-30 — End: 2014-08-26

## 2013-08-30 NOTE — Progress Notes (Signed)
Subjective:      Patient ID: Roy Weaver is a 28 y.o. male.    HPI Comments: 08/30/13 Patient presents with:  Seizures: April 2nd  . Was seen and evaluated by Neurology in 3/15 .  States needs a ok note for work             Review of Systems   Constitutional: Negative for fever, chills and fatigue.        All vaccinations complete    HENT: Negative.    Eyes: Negative.         Glasses ; Eye ex 2/14    Respiratory: Negative for cough, chest tightness, shortness of breath and wheezing.         Smokes 1 ppd ; Not a heavy drinker ; Marijuana occ     No Asthma    Cardiovascular: Negative.         Dad has HTN    Gastrointestinal: Negative.  Negative for diarrhea, constipation and blood in stool.        No FH of ca colon    Endocrine:        Dad has Diabetes    Genitourinary: Negative for dysuria, urgency and frequency.   Musculoskeletal: Negative.    Skin: Negative for rash.   Neurological: Positive for seizures. Negative for dizziness, tremors, weakness, light-headedness and headaches.   Psychiatric/Behavioral: Negative for behavioral problems and sleep disturbance. The patient is not nervous/anxious.        Objective:   Physical Exam   Constitutional: He is oriented to person, place, and time. He appears well-nourished.   HENT:   Mouth/Throat: Oropharynx is clear and moist.   Eyes: Conjunctivae are normal.   Neck: Neck supple.   Cardiovascular: Normal rate, regular rhythm and normal heart sounds.    Pulmonary/Chest: Breath sounds normal.   Abdominal: Soft.   Musculoskeletal: Normal range of motion.   Neurological: He is alert and oriented to person, place, and time.   Skin: Skin is warm.   Psychiatric: He has a normal mood and affect. His behavior is normal.       Assessment:     Roy Weaver was seen today for seizures.    Diagnoses and associated orders for this visit:    Seizure disorder (HCC) . On Dilantin and therapeutic . If repeat episodes occur then pl see Neurology  . Advised to stay off Marijuana or any other rec  drugs     Smoker . Agrees to start Nicoderm patch   - nicotine (NICODERM CQ) 21 MG/24HR; Place 1 patch onto the skin every 24 hours.                 Plan:

## 2013-08-30 NOTE — Telephone Encounter (Signed)
Pt phoned needing a letter for work.  I checked with Dr Glean Hessajan can only give restrictions for seizures.  Called pt

## 2013-09-13 ENCOUNTER — Inpatient Hospital Stay
Admit: 2013-09-14 | Disposition: A | Discharge status: Home / Self Care | Source: Home / Self Care | Admitting: Family Medicine

## 2013-09-13 LAB — PROTIME-INR
INR: 1.18 — ABNORMAL HIGH (ref 0.85–1.16)
Protime: 12.8 s — ABNORMAL HIGH (ref 9.1–12.6)

## 2013-09-13 LAB — COMPREHENSIVE METABOLIC PANEL
ALT: 10 U/L (ref 10–40)
AST: 18 U/L (ref 15–37)
Albumin/Globulin Ratio: 1.5 (ref 1.1–2.2)
Albumin: 4.5 g/dL (ref 3.4–5.0)
Alkaline Phosphatase: 124 U/L (ref 40–129)
Anion Gap: 16 (ref 3–16)
BUN: 10 mg/dL (ref 7–20)
CO2: 25 mmol/L (ref 21–32)
Calcium: 9.3 mg/dL (ref 8.3–10.6)
Chloride: 98 mmol/L — ABNORMAL LOW (ref 99–110)
Creatinine: 0.7 mg/dL — ABNORMAL LOW (ref 0.9–1.3)
GFR African American: >60 (ref >60)
GFR Non-African American: >60 (ref >60)
Globulin: 3.1 g/dL
Glucose: 78 mg/dL (ref 70–99)
Potassium: 4 mmol/L (ref 3.5–5.1)
Sodium: 139 mmol/L (ref 136–145)
Total Bilirubin: 0.3 mg/dL (ref 0.0–1.0)
Total Protein: 7.6 g/dL (ref 6.4–8.2)

## 2013-09-13 LAB — CBC WITH AUTO DIFFERENTIAL
Basophils %: 0.6 %
Basophils Absolute: 0 10*3/uL (ref 0.0–0.2)
Eosinophils %: 4.9 %
Eosinophils Absolute: 0.2 10*3/uL (ref 0.0–0.6)
Hematocrit: 41.1 % (ref 40.5–52.5)
Hemoglobin: 14.4 g/dL (ref 13.5–17.5)
Lymphocytes %: 37.8 %
Lymphocytes Absolute: 1.5 10*3/uL (ref 1.0–5.1)
MCH: 32.2 pg (ref 26.0–34.0)
MCHC: 35.1 g/dL (ref 31.0–36.0)
MCV: 91.9 fL (ref 80.0–100.0)
MPV: 7.8 fL (ref 5.0–10.5)
Monocytes %: 10.9 %
Monocytes Absolute: 0.4 10*3/uL (ref 0.0–1.3)
Neutrophils %: 45.8 %
Neutrophils Absolute: 1.8 10*3/uL (ref 1.7–7.7)
Platelets: 211 10*3/uL (ref 135–450)
RBC: 4.47 M/uL (ref 4.20–5.90)
RDW: 12.9 % (ref 12.4–15.4)
WBC: 4 10*3/uL (ref 4.0–11.0)

## 2013-09-13 LAB — PHENYTOIN LEVEL, TOTAL: Phenytoin Lvl: 5.4 ug/mL — ABNORMAL LOW (ref 10.0–20.0)

## 2013-09-13 LAB — TROPONIN: Troponin: <0.01 ng/mL (ref <0.01)

## 2013-09-13 LAB — APTT: aPTT: 31.1 s (ref 23.1–36.1)

## 2013-09-13 MED ADMIN — ondansetron (ZOFRAN) injection 4 mg: 4 mg | INTRAMUSCULAR | NDC 00409475503

## 2013-09-13 MED ADMIN — LORazepam (ATIVAN) injection 2 mg: 2 mg | INTRAMUSCULAR | @ 23:00:00 | NDC 00641604401

## 2013-09-13 MED FILL — DILANTIN 30 MG PO CAPS: 30 MG | ORAL | Qty: 5

## 2013-09-13 MED FILL — LORAZEPAM 2 MG/ML IJ SOLN: 2 MG/ML | INTRAMUSCULAR | Qty: 1

## 2013-09-13 MED FILL — ONDANSETRON HCL 4 MG/2ML IJ SOLN: 4 MG/2ML | INTRAMUSCULAR | Qty: 2

## 2013-09-13 NOTE — ED Notes (Signed)
Entered pt room and pt was lying on bed with head between rails. Pt drooling and not responding to pain. Incontinent of urine. Ativan given im. Pt had been discharged by previous nurse and was awaiting ride.     Glade StanfordMichele Neaveh Belanger, RN  09/13/13 [phone]

## 2013-09-13 NOTE — ED Notes (Signed)
Pt placed on monitor. Began to have emesis. Gave zofran for nausea    Glade StanfordMichele Anabel Lykins, RN  09/13/13 1941

## 2013-09-13 NOTE — ED Notes (Signed)
Seizure precautions in place      ChesterMichele Shirah Roseman, RN  09/13/13 [phone]

## 2013-09-13 NOTE — ED Provider Notes (Signed)
Roy Weaver is a 28 y.o. male who presents with a complaint of   Chief Complaint   Patient presents with   ??? Seizures   ??? Head Injury       On my exam:  Vital Signs: Temp(Src) 98.5 ??F (36.9 ??C) (Oral)   General: Patient appears non-toxic    This patient was seen by the mid-level provider.  I have seen and examined the patient face to face, agree with the workup, evaluation, management and diagnosis.  Care plan has been discussed.      Electronically signed by: Fanny SkatesJay R Sharell Hilmer, MD, 09/13/2013  4:50 PM      Fanny SkatesJay R Kriya Westra, MD  09/13/13 [phone]

## 2013-09-13 NOTE — ED Notes (Signed)
Peggy Rn given report, pt going to [address]    Marijke Guadiana, RN  09/13/13 2352

## 2013-09-13 NOTE — ED Notes (Signed)
Bed: B-06  Expected date: 09/13/13  Expected time: 4:19 PM  [facility]  Comments:  seizure

## 2013-09-13 NOTE — ED Notes (Signed)
Pt presents to the ED by squad. Pt had a seizure and when he went down hit the back of his head. Pt denies pain. Pt states he took his seizure medicine today.    Finis BudMegan Nikiya Starn, RN  09/13/13 [phone]

## 2013-09-13 NOTE — ED Notes (Signed)
Discharged instructions given to pt. Pt verbalized understanding. IV removed. Pt denies pain. VSS.     Finis BudMegan Anjel Pardo, RN  09/13/13 1858

## 2013-09-13 NOTE — ED Notes (Signed)
Pt head cleansed with hibacleanse and irrigated with saline. Mostly dry blood, abrasion is superficial not actively bleeding.      Finis BudMegan Ifeanyichukwu Wickham, RN  09/13/13 [phone]

## 2013-09-13 NOTE — ED Notes (Addendum)
Pt remains sleepy but will answer questions. Has not had dilantin due to not being able to swallow at this time. Yehuda MaoAnna Pa aware.     Glade StanfordMichele Zakaria Sedor, RN  09/13/13 2215    Glade StanfordMichele Lorren Splawn, RN  09/13/13 2217

## 2013-09-13 NOTE — ED Provider Notes (Signed)
[facility]  eMERGENCY dEPARTMENT eNCOUnter      Pt Name: Roy Weaver  MRN: [id number]  Birthdate 11-22-1985  Date of evaluation: 09/13/2013  Provider: Maple MirzaAnna A Plasterer, PA    CHIEF COMPLAINT       Chief Complaint   Patient presents with   ??? Seizures   ??? Head Injury     HISTORY OF PRESENT ILLNESS  (Location/Symptom, Timing/Onset, Context/Setting, Quality, Duration, Modifying Factors, Severity.)   Roy Weaver is a 28 y.o. male who presents to the emergency department after having a witnessed seizure.  Patient states he was working on his car with a friend around 4 PM this afternoon when he stated he fell and doesn't recall what happened but woke and had a wound to the back of his head.  Patient states that he does not currently have a headache and nausea and blurred vision or changes in vision.  Denies any nausea or vomiting.  Patient states he has not been taking his Dilantin properly.  Patient has a seizure disorder and chronic seizure history.  He denies any diarrhea, fevers, chills or abdominal pain.  Patient denies any chest pain or shortness of breath before or after the event.  Patient denies any congestion alcohol use or drug use.      Nursing Notes were reviewed and I agree.    REVIEW OF SYSTEMS    (2-9 systems for level 4, 10 or more for level 5)     Review of Systems   Constitutional: Negative for fever and chills.   Eyes: Negative for photophobia and visual disturbance.   Respiratory: Negative for cough, chest tightness, shortness of breath and wheezing.    Cardiovascular: Negative for chest pain.   Gastrointestinal: Negative for nausea, vomiting, abdominal pain and diarrhea.   Genitourinary: Negative for dysuria.   Musculoskeletal: Negative for back pain, neck pain and neck stiffness.   Skin: Negative for color change, rash and wound.   Neurological: Positive for seizures and syncope. Negative for weakness, numbness and headaches.   Psychiatric/Behavioral: Negative for behavioral  problems, confusion and agitation.   All other systems reviewed and are negative.      Except as noted above the remainder of the review of systems was reviewed and negative.       PAST MEDICAL HISTORY         Diagnosis Date   ??? Seizures (HCC)    ??? Migraine        SURGICAL HISTORY     History reviewed. No pertinent past surgical history.    CURRENT MEDICATIONS       Previous Medications    LEVETIRACETAM (KEPPRA) 500 MG TABLET    Take 1 tablet by mouth 2 times daily.    NICOTINE (NICODERM CQ) 21 MG/24HR    Place 1 patch onto the skin every 24 hours.    PHENYTOIN (DILANTIN) 200 MG ER CAPSULE    Take 2 capsules by mouth nightly.       ALLERGIES     Review of patient's allergies indicates no known allergies.    FAMILY HISTORY           Problem Relation Age of Onset   ??? Diabetes Father      Family Status   Relation Status Death Age   ??? Father Alive    ??? Mother Alive         SOCIAL HISTORY      reports that he has been smoking Cigarettes.  He has a 1 pack-year smoking history. He has never used smokeless tobacco. He reports that he does not drink alcohol or use illicit drugs.    PHYSICAL EXAM    (up to 7 for level 4, 8 or more for level 5)   ED Triage Vitals   BP Temp Temp Source Pulse Resp SpO2 Height Weight   09/13/13 1716 09/13/13 1629 09/13/13 1629 -- -- -- -- --   136/82 mmHg 98.5 ??F (36.9 ??C) Oral            Physical Exam   Constitutional: He is oriented to person, place, and time. He appears well-developed and well-nourished. No distress.   HENT:   Head: Normocephalic. Head is with contusion and with laceration (superficial, bleeding has stopped).       Eyes: Pupils are equal, round, and reactive to light.   Neck: Normal range of motion.   Cardiovascular: Normal rate, regular rhythm and normal heart sounds.    Pulmonary/Chest: Effort normal and breath sounds normal. No respiratory distress. He has no wheezes.   Abdominal: Soft. He exhibits no distension and no mass. There is no tenderness.   Musculoskeletal: Normal  range of motion. He exhibits no edema or tenderness.   Neurological: He is alert and oriented to person, place, and time. No cranial nerve deficit. Coordination normal. GCS eye subscore is 3. GCS verbal subscore is 5. GCS motor subscore is 5.   Skin: Skin is warm and dry. No rash noted. No erythema.   Psychiatric: He has a normal mood and affect. His behavior is normal.   Nursing note and vitals reviewed.          DIAGNOSTIC RESULTS     EKG: All EKG's are interpreted by Rae Roam, PA in the absence of a cardiologist.    Please refer to Dr. Donato Heinz note for complete interpretation    RADIOLOGY:   Non-plain film images such as CT, Ultrasound and MRI are read by the radiologist. Plain radiographic images are visualized and preliminarily interpreted by Rae Roam, PA with the below findings:    Reviewed radiology's interpretation.    Interpretation per the Radiologist below, if available at the time of this note:    CT HEAD WO CONTRAST    Final Result: IMPRESSION:     1. No acute intracranial abnormality.   XR CHEST STANDARD TWO VW    Final Result: IMPRESSION:     1. No acute abnormality.         LABS:  Labs Reviewed   COMPREHENSIVE METABOLIC PANEL - Abnormal; Notable for the following:     Chloride 98 (*)     CREATININE 0.7 (*)     All other components within normal limits   PROTIME-INR - Abnormal; Notable for the following:     Protime 12.8 (*)     INR 1.18 (*)     All other components within normal limits   PHENYTOIN LEVEL, TOTAL - Abnormal; Notable for the following:     Phenytoin Lvl 5.4 (*)     All other components within normal limits   CBC WITH AUTO DIFFERENTIAL   TROPONIN   APTT   URINE DRUG SCREEN   URINE RT REFLEX TO CULTURE       All other labs were within normal range or not returned as of this dictation.    EMERGENCY DEPARTMENT COURSE and DIFFERENTIAL DIAGNOSIS/MDM:   Vitals:    Filed Vitals:    09/13/13 1629 09/13/13  1716   BP:  136/82   Temp: 98.5 ??F (36.9 ??C)    TempSrc: Oral      I  discussed with Roy Weaver and/or family the exam results, diagnosis, care, prognosis, reasons to return and the importance of follow up. Patient and/or family is in full agreement with plan and all questions have been answered.  Specific discharge instructions explained, including reasons to return to the emergency department.Roy Weaver is well appearing, non-toxic, and afebrile at the time of discharge.     Upon presentation patient is resting comfortable in his bed - GCS of 15.  Patient's vital signs are stable he's afebrile.  Patient denies headache, patient has a small superficial abrasion to the posterior scalp.  With a small surrounding contusion.  Patient's CT scan of the head was negative.  Patient has history of seizure disorder and states he has not been taking his Dilantin as prescribed.  Patient states his neurologist recently decided to switch him to Keppra but he has not started this because his mother wants him to continue taking the Dilantin.  Patient states that he did not have any visual changes and denies any nausea vomiting or chest pain.  He denies any prodromal symptoms or other symptoms now. I thoroughly cleansed the area of the abrasion but there was nothing I could Staple or suture. Patient's tetanus is up-to-date. Patient had another seizure here in the ED. Patient was given 2 mg of IM Ativan and was given 750 of Dilantin as his Dilantin level was 5.4.  Patient became nauseous was given Zofran here in the ED. Consult to hospitalist this patient is continued to be postictal.    I estimate there is LOW risk for SKULL FRACTURE, SUBARACHNOID HEMORRHAGE, INTRACRANIAL HEMORRHAGE, SUBDURAL OR EPIDURAL HEMATOMA,  thus I consider the discharge disposition reasonable. The patient and/or family and I have discussed the diagnosis and risks, and we agree with discharging home to follow-up with their primary doctor. We also discussed returning to the Emergency Department immediately if new or  worsening symptoms occur. We have discussed the symptoms which are most concerning (e.g., changing or worsening pain, weakness, vomiting, fever) that necessitate immediate return.    This patient was seen by the attending physician and they agreed with the assessment and plan.    CONSULTS:  None    PROCEDURES:  None    FINAL IMPRESSION      1. History of seizure          DISPOSITION/PLAN   DISPOSITION     PATIENT REFERRED TO:  Nisar Karmen StabsFatima Haq, MD  [address]  [place] [id number]    Schedule an appointment as soon as possible for a visit  For follow up    Salvadore FarberVijay Rajan, MD  [address], [address]  [place] [id number]  [phone]    Call in 2 days  As needed, For follow up      DISCHARGE MEDICATIONS:  New Prescriptions    No medications on file       (Please note that portions of this note were completed with a voice recognition program.  Efforts were made to edit the dictations but occasionally words are mis-transcribed.)    Rae RoamAnna A Plasterer, PA      Rae RoamAnna A Plasterer, PA  09/13/13 1859    Maple MirzaAnna A Plasterer, PA  09/13/13 2252

## 2013-09-13 NOTE — ED Notes (Signed)
Pt is currently sleeping, will answer question when asked but is slow to do so    Glade StanfordMichele Mailyn Steichen, RN  09/13/13 2337

## 2013-09-13 NOTE — Progress Notes (Signed)
List of patient's home medications has been updated and is complete.   List of medications was obtained from talking to patient's pharmacy. Patient was not able to answer any question.  [facility] Tel: [phone].

## 2013-09-14 LAB — BASIC METABOLIC PANEL
Anion Gap: 14 (ref 3–16)
BUN: 8 mg/dL (ref 7–20)
CO2: 23 mmol/L (ref 21–32)
Calcium: 8.6 mg/dL (ref 8.3–10.6)
Chloride: 100 mmol/L (ref 99–110)
Creatinine: 0.8 mg/dL — ABNORMAL LOW (ref 0.9–1.3)
GFR African American: >60 (ref >60)
GFR Non-African American: >60 (ref >60)
Glucose: 86 mg/dL (ref 70–99)
Potassium: 4.2 mmol/L (ref 3.5–5.1)
Sodium: 137 mmol/L (ref 136–145)

## 2013-09-14 LAB — URINALYSIS WITH REFLEX TO CULTURE
Bilirubin Urine: NEGATIVE
Blood, Urine: NEGATIVE
Glucose, Ur: NEGATIVE mg/dL
Leukocyte Esterase, Urine: NEGATIVE
Nitrite, Urine: NEGATIVE
Protein, UA: 30 mg/dL — AB
Specific Gravity, UA: 1.023 (ref 1.005–1.030)
Urobilinogen, Urine: 0.2 E.U./dL (ref <2.0)
pH, UA: 6.5 (ref 5.0–8.0)

## 2013-09-14 LAB — EKG 12-LEAD
Atrial Rate: 73 {beats}/min
Atrial Rate: 79 {beats}/min
P Axis: 59 degrees
P Axis: 9 degrees
P-R Interval: 196 ms
P-R Interval: 208 ms
Q-T Interval: 368 ms
Q-T Interval: 388 ms
QRS Duration: 78 ms
QRS Duration: 88 ms
QTc Calculation (Bazett): 421 ms
QTc Calculation (Bazett): 427 ms
R Axis: 88 degrees
R Axis: 95 degrees
T Axis: 56 degrees
T Axis: 73 degrees
Ventricular Rate: 73 {beats}/min
Ventricular Rate: 79 {beats}/min

## 2013-09-14 LAB — MICROSCOPIC URINALYSIS
Epithelial Cells, UA: 1 /HPF (ref 0–5)
Hyaline Casts, UA: 3 /HPF (ref 0–8)
RBC, UA: 0 /HPF (ref 0–4)
WBC, UA: 1 /HPF (ref 0–5)

## 2013-09-14 LAB — URINE DRUG SCREEN
Amphetamine Screen, Urine: NEGATIVE
Barbiturate Screen, Ur: NEGATIVE (ref <200)
Benzodiazepine Screen, Urine: NEGATIVE (ref <200)
Cannabinoid Scrn, Ur: POSITIVE — AB (ref <50)
Cocaine Metabolite Screen, Urine: NEGATIVE (ref <300)
Methadone Screen, Urine: NEGATIVE (ref <300)
Opiate Scrn, Ur: NEGATIVE (ref <300)
PCP Screen, Urine: NEGATIVE (ref <25)
Propoxyphene Scrn, Ur: NEGATIVE (ref <300)
pH, UA: 5

## 2013-09-14 LAB — PHENYTOIN LEVEL, TOTAL: Phenytoin Lvl: 19.3 ug/mL (ref 10.0–20.0)

## 2013-09-14 MED ORDER — ACETAMINOPHEN 325 MG PO TABS
325 | ORAL | Status: DC | PRN
Start: 2013-09-14 — End: 2013-09-15
  Administered 2013-09-15: 01:00:00 650 mg via ORAL

## 2013-09-14 MED ORDER — PHENYTOIN SODIUM EXTENDED 100 MG PO CAPS
100 MG | Freq: Three times a day (TID) | ORAL | Status: DC
Start: 2013-09-14 — End: 2013-09-15
  Administered 2013-09-14 – 2013-09-15 (×3): 200 mg via ORAL

## 2013-09-14 MED ADMIN — pantoprazole (PROTONIX) tablet 40 mg: 40 mg | ORAL | @ 11:00:00 | NDC 51079005101

## 2013-09-14 MED ADMIN — sodium chloride 0.9 % infusion: 1 | INTRAVENOUS | @ 04:00:00 | NDC 00338004902

## 2013-09-14 MED ADMIN — phenytoin (DILANTIN) 1,000 mg in sodium chloride 0.9 % 100 mL IVPB (loading dose): 1000 mg | INTRAVENOUS | @ 05:00:00 | NDC 00641255541

## 2013-09-14 MED ADMIN — levetiracetam (KEPPRA) 1000 mg/100 mL IVPB: 1000 mg | INTRAVENOUS | @ 03:00:00 | NDC 67457026500

## 2013-09-14 MED ADMIN — nicotine (NICODERM CQ) 21 MG/24HR 1 patch: 1 | TRANSDERMAL | @ 13:00:00 | NDC 00067512609

## 2013-09-14 MED ADMIN — phenytoin (DILANTIN) ER capsule 100 mg: 100 mg | ORAL | @ 13:00:00 | NDC 68084037611

## 2013-09-14 MED FILL — PANTOPRAZOLE SODIUM 40 MG PO TBEC: 40 MG | ORAL | Qty: 1

## 2013-09-14 MED FILL — LEVETIRACETAM IN NACL 500 MG/100ML IV SOLN: 500 MG/100ML | INTRAVENOUS | Qty: 100

## 2013-09-14 MED FILL — SODIUM CHLORIDE 0.9 % IV SOLN: 0.9 % | INTRAVENOUS | Qty: 250

## 2013-09-14 MED FILL — PHENYTOIN SODIUM 50 MG/ML IJ SOLN: 50 MG/ML | INTRAMUSCULAR | Qty: 20

## 2013-09-14 MED FILL — NICOTINE 21 MG/24HR TD PT24: 21 MG/24HR | TRANSDERMAL | Qty: 1

## 2013-09-14 MED FILL — SODIUM CHLORIDE 0.9 % IV SOLN: 0.9 % | INTRAVENOUS | Qty: 1000

## 2013-09-14 MED FILL — LOVENOX 40 MG/0.4ML SC SOLN: 40 MG/0.4ML | SUBCUTANEOUS | Qty: 0.4

## 2013-09-14 MED FILL — LEVETIRACETAM IN NACL 1000 MG/100ML IV SOLN: 1000 MG/100ML | INTRAVENOUS | Qty: 100

## 2013-09-14 MED FILL — PHENYTOIN SODIUM EXTENDED 100 MG PO CAPS: 100 MG | ORAL | Qty: 2

## 2013-09-14 MED FILL — PHENYTOIN SODIUM EXTENDED 100 MG PO CAPS: 100 MG | ORAL | Qty: 1

## 2013-09-14 NOTE — Plan of Care (Signed)
Problem: SAFETY  Goal: Free from accidental physical injury  Outcome: Ongoing  Patient assessed for fall risk; fall precautions initiated. Patient and family instructed about safety devices. Environment kept free of clutter and adequate lighting provided.  Bed locked and in lowest position.  Call light within reach. Will continue to monitor.        Problem: DAILY CARE  Goal: Daily care needs are met  Outcome: Ongoing  Patient's ability assessed to perform self care and independent activity encouraged according to that ability.  Assisted with ADL's as needed.  Risk for skin breakdown assessed.  Potential discharge needs assessed.  Patient and family included in daily care decisions.        Problem: PAIN  Goal: Patient???s pain/discomfort is manageable  Outcome: Met This Shift  Denies pain this shift    Problem: KNOWLEDGE DEFICIT  Goal: Patient/S.O. demonstrates understanding of disease process, treatment plan, medications, and discharge instructions.  Outcome: Ongoing  Assess baseline knowledge and provide teaching at level of understanding. Provide teaching via preferred learning method. Provide instruction on use of medical equipment.

## 2013-09-14 NOTE — Plan of Care (Signed)
Problem: SAFETY  Goal: Free from accidental physical injury  Outcome: Ongoing  Free from accidental physical injury, room remains free of clutter and well light, call light in reach

## 2013-09-14 NOTE — Consults (Signed)
Department of Neurological Sciences  Section of Epilepsy  Attending Consult Note        Reason for Consult:  Seizure  Requesting Physician:  Dr. Baxter KailLecky    CHIEF COMPLAINT:  "I had a seizure."    History Obtained From:  patient, electronic medical record       HISTORY OF PRESENT ILLNESS:              The patient is a 28 y.o. male with significant past medical history of seizures and migraine HA who presents with recurrent seizures last night.  In ED received ativan and 1000 mg Dilantin.  Pt states he has been compliant with taking Phenytek 400mg  QHS; dilantin level in ED was 5.4.  Dilantin level this morning is 19.  He is not taking Keppra.  He states he tolerates Phenytek (ER Dilantin) just fine.        Past Medical History:        Diagnosis Date   . Seizures (HCC)    . Migraine      Past Surgical History:    History reviewed. No pertinent past surgical history.  Current Medications:   Current facility-administered medications:levetiracetam (KEPPRA) 500 mg/100 mL IVPB, 500 mg, Intravenous, Q12H  nicotine (NICODERM CQ) 21 MG/24HR 1 patch, 1 patch, Transdermal, Q24H  phenytoin (DILANTIN) ER capsule 100 mg, 100 mg, Oral, TID  sodium chloride flush 0.9 % injection 10 mL, 10 mL, Intravenous, Q12H SCH  sodium chloride flush 0.9 % injection 10 mL, 10 mL, Intravenous, PRN  acetaminophen (TYLENOL) tablet 650 mg, 650 mg, Oral, Q4H PRN  magnesium hydroxide (MILK OF MAGNESIA) 400 MG/5ML suspension 30 mL, 30 mL, Oral, Daily PRN  ondansetron (ZOFRAN) injection 4 mg, 4 mg, Intravenous, Q6H PRN  enoxaparin (LOVENOX) injection 40 mg, 40 mg, Subcutaneous, Daily  pantoprazole (PROTONIX) tablet 40 mg, 40 mg, Oral, QAM AC  glucose (GLUTOSE) 40 % oral gel 15 g, 15 g, Oral, PRN  dextrose 50 % solution 12.5 g, 12.5 g, Intravenous, PRN  glucagon (rDNA) injection 1 mg, 1 mg, Intramuscular, PRN  dextrose 5 % solution, 100 mL/hr, Intravenous, PRN  Allergies:  Review of patient's allergies indicates no known allergies.    Social  History:  OCCUPATION:  Employed at a Physicist, [employer] store.  Family History:       Problem Relation Age of Onset   . Diabetes Father        Family Neurologic History  Mother:  no neurologic history  Father:  no neurologic history    REVIEW OF SYSTEMS:  NEUROLOGICAL:  negative for memory problems, speech problems, visual disturbance, coordination problems, gait problems, tremor and weakness    PHYSICAL EXAM:    Vitals:  BP 125/68   Pulse 75   Temp(Src) 98 F (36.7 C) (Oral)   Resp 17   Ht 6\' 4"  (1.93 m)   Wt 166 lb 14.2 oz (75.7 kg)   BMI 20.32 kg/m2     SpO2 95%     General Appearance:  Healthy-appearing young male, in NAD.      Mental Status Exam:             Level of Alertness:   awake            Orientation:   person, place, time            Memory:   normal            Fund of Knowledge:  normal  Attention/Concentration:  normal            Language:  normal      Cranial Nerves        Cranial nerve II           Visual acuity:  normal           Visual fields:  normal      Cranial nerve III           Pupils:  equal, round, reactive to light      Cranial nerves III, IV, VI           Extra Ocular Movements: intact      Cranial nerve V           Facial sensation:  intact           Corneal reflex:  not completed      Cranial nerve VII           Facial strength: intact      Cranial nerve VIII           Hearing:  intact      Cranial nerve IX           Palate:  normal      Cranial nerve XI         Shoulder shrug:  normal      Cranial nerve XII          Tongue movement:  normal    Motor:    Drift:  absent  Motor exam is symmetrical 5 out of 5 all extremities bilaterally  Tone:  normal  Abnormal Movements:  absent    Sensory:              Touch              Right Upper Extremity:  normal              Left Upper Extremity:  normal              Right Lower Extremity:  normal              Left Lower Extremity:  normal                  Coordination:           Finger/Nose   Right:  normal              Left:  normal                    Gait:                       Casual:  normal            Reflexes:             Deep Tendon Reflexes:    Symmetric and non-pathological      Additional Examination Elements and Findings:  Left side of tongue is swollen.       DATA  LABS:  General Labs:    CBC:   Lab Results   Component Value Date    WBC 4.0 09/13/2013    RBC 4.47 09/13/2013    HGB 14.4 09/13/2013    HCT 41.1 09/13/2013    MCV 91.9 09/13/2013    MCH 32.2 09/13/2013    MCHC 35.1 09/13/2013    RDW 12.9 09/13/2013    PLT 211 09/13/2013  MPV 7.8 09/13/2013     CMP:    Lab Results   Component Value Date    NA 137 09/14/2013    K 4.2 09/14/2013    CL 100 09/14/2013    CO2 23 09/14/2013    BUN 8 09/14/2013    CREATININE 0.8 09/14/2013    CREATININE 1.0 08/17/2011    GFRAA >60 09/14/2013    GFRAA 157 08/19/2011    AGRATIO 1.5 09/13/2013    LABGLOM >60 09/14/2013    GLUCOSE 86 09/14/2013    PROT 7.6 09/13/2013    LABALBU 4.5 09/13/2013    CALCIUM 8.6 09/14/2013    BILITOT 0.3 09/13/2013    ALKPHOS 124 09/13/2013    AST 18 09/13/2013    ALT 10 09/13/2013     Drug Levels:    Dilantin, Phenytek (Phenytoin):    No components found with this basename: DIL, DILFREE       IMPRESSIONS:      1.  Seizure disorder exacerbation due to subtherapeutic dosing; I believe the pt that he is compliant with Phenytek (the medication was present on testing in the ED- just subtherapeutic).  He just needs a greater daily dose of Phenytek; Keppra is not necessary.        Recommendations:  1.  Increase daily total dose of Phenytek (200 mg ER Dilantin capsule) to 600mg  taken as:  One capsule in the AM, two capsules HS.      2.  Follow-up with his treating neurologist.         Ladona Horns, MD  Neurohospitalist

## 2013-09-14 NOTE — H&P (Signed)
Hospital Medicine History & Physical      PCP: [name], MD    Date of Admission: 09/13/2013    Date of Service: Pt seen/examined on 09/14/13 and Admitted to Inpatient    Chief Complaint:  seizure      History Of Present Illness:      The patient is a 28 y.o. male w/ hx of seizure disorder non adherent to meds who presents to [facility] with acute grand mal seizure w/ post ictal state. Had another seizure in ER. Loss of bladder fxn.  Received ativan in ER. Non adherent to dilantin regimen. Recently switched outpt to keppra but has not yet filled scripts. I have ordered 1g IV dilantin and keppra once the pt arrived on the floor. He is being admitted for further evaluation.        Past Medical History:        Diagnosis Date   ??? Seizures (HCC)    ??? Migraine        Past Surgical History:    History reviewed. No pertinent past surgical history.    Medications Prior to Admission:    Prior to Admission medications    Medication Sig Start Date End Date Taking? Authorizing Provider   nicotine (NICODERM CQ) 21 MG/24HR Place 1 patch onto the skin every 24 hours. 08/30/13 08/30/14  Nisar Karmen StabsFatima Haq, MD   levETIRAcetam (KEPPRA) 500 MG tablet Take 1 tablet by mouth 2 times daily. 08/23/13   Minette BrineEmily E Gilb, PA-C   phenytoin (DILANTIN) 200 MG ER capsule Take 2 capsules by mouth nightly. 08/07/13   Salvadore FarberVijay Rajan, MD       Allergies:  Review of patient's allergies indicates no known allergies.    Social History:      TOBACCO:   reports that he has been smoking Cigarettes.  He has a 1 pack-year smoking history. He has never used smokeless tobacco.  ETOH:   reports that he does not drink alcohol.      Family History:  Reviewed in detail and positive as follows:        Problem Relation Age of Onset   ??? Diabetes Father        REVIEW OF SYSTEMS:   As noted in the HPI and ER ROS. All other systems  reviewed and negative.    PHYSICAL EXAM:    BP 119/67    Pulse 102    Temp(Src) 98.2 ??F (36.8 ??C) (Oral)    Resp 18    Ht 6\' 4"  (1.93 m)    Wt 166 lb 14.2 oz (75.7 kg)    BMI 20.32 kg/m2      SpO2 95%     General appearance: No apparent distress appears stated age and cooperative.  HEENT Normal cephalic, atraumatic without obvious deformity.  Pupils equal, round.  Extra ocular muscles intact.  Conjunctivae/corneas clear.  Neck: Supple, No jugular venous distention.  Trachea midline without thyromegaly or .  Lungs: decreased at bases no wheezes, crackles or rhonchi  Heart: Regular rate and rhythm with Normal S1/S2 without murmurs, rubs or gallops, point of maximum impulse non-displaced  Abdomen: Soft, non-tender or non-distended without rigidity or guarding and positive bowel sounds all four quadrants.  Extremities: No clubbing, cyanosis, or edema bilaterally.  .  Skin: Skin color, texture, turgor normal.  No rashes or lesions.  Neurologic: Alert and oriented X 3, neurovascularly intact with sensory/motor intact upper extremities/lower extremities, bilaterally.  Cranial nerves: II-XII intact, grossly non-focal.  Mental status: lethargic, poor eye contact, able to answer questions appropriately w/ prompting    CXR:  I have reviewed the CXR with the following interpretation: no abnormality      CBC   Recent Labs      09/13/13   1650   WBC  4.0   HGB  14.4   HCT  41.1   PLT  211      RENAL  Recent Labs      09/13/13   1650  09/14/13   0440   NA  139  137   K  4.0  4.2   CL  98*  100   CO2  25  23   BUN  10  8   CREATININE  0.7*  0.8*     LFT'S  Recent Labs      09/13/13   1650   AST  18   ALT  10   BILITOT  0.3   ALKPHOS  124     COAG  Recent Labs      09/13/13   1650   INR  1.18*     CARDIAC ENZYMES  Recent Labs      09/13/13   1650   TROPONINI  <0.01       U/A:    Lab Results   Component Value Date    COLORU YELLOW 09/13/2013    WBCUA 1 09/13/2013    RBCUA 0 09/13/2013    BACTERIA Few 08/17/2011    CLARITYU Clear  09/13/2013    SPECGRAV 1.023 09/13/2013    LEUKOCYTESUR Negative 09/13/2013    BLOODU Negative 09/13/2013    GLUCOSEU Negative 09/13/2013    AMORPHOUS Present 08/17/2011       Active Hospital Problems    Diagnosis Date Noted   ??? Recurrent seizures (HCC) [345.80] 09/14/2013   ??? History of nonadherence to medical treatment [V15.81] 09/14/2013         PHYSICIANS CERTIFICATION:    I certify that Roy Weaver is expected to be hospitalized for >  than 2 midnights based on the following assessment and plan:      ASSESSMENT/PLAN:    28yo M w/ seizure disorder non adherent to meds presenting w/ acute grand mal seizure w/ post ictal state. Received ativan in ER. Non adherent to dilantin regimen. Recently switched outpt to keppra but has not yet filled scripts. I have ordered 1g IV dilantin and keppra and well as neurology eval.    1. Pt admitted w/ dx and plan of care as above  2. F/u neurology recs  3. Seizure precautions  4. Discharge planning    DVT Prophylaxis: lovenox  Diet: DIET GENERAL;  Code Status: Full Code    Dispo - home once stable       Kelly SplinterKerry A Phynix Horton, MD    Thank you NISAR Karmen StabsFATIMA HAQ, MD for the opportunity to be involved in this patient's care. If you have any questions or concerns please feel free to contact me at [phone].

## 2013-09-15 MED ORDER — PHENYTOIN SODIUM EXTENDED 200 MG PO CAPS
200 MG | ORAL_CAPSULE | ORAL | Status: DC
Start: 2013-09-15 — End: 2013-11-07

## 2013-09-15 MED ADMIN — sodium chloride flush 0.9 % injection 10 mL: 10 mL | INTRAVENOUS | @ 13:00:00

## 2013-09-15 MED ADMIN — enoxaparin (LOVENOX) injection 40 mg: 40 mg | SUBCUTANEOUS | @ 13:00:00 | NDC 00075801401

## 2013-09-15 MED ADMIN — sodium chloride flush 0.9 % injection 10 mL: 10 mL | INTRAVENOUS | @ 01:00:00

## 2013-09-15 MED ADMIN — pantoprazole (PROTONIX) tablet 40 mg: 40 mg | ORAL | @ 10:00:00 | NDC 51079005101

## 2013-09-15 MED ADMIN — nicotine (NICODERM CQ) 21 MG/24HR 1 patch: 1 | TRANSDERMAL | @ 13:00:00 | NDC 00067512609

## 2013-09-15 MED FILL — PHENYTOIN SODIUM EXTENDED 100 MG PO CAPS: 100 MG | ORAL | Qty: 2

## 2013-09-15 MED FILL — TYLENOL 325 MG PO TABS: 325 MG | ORAL | Qty: 2

## 2013-09-15 MED FILL — NORMAL SALINE FLUSH 0.9 % IV SOLN: 0.9 % | INTRAVENOUS | Qty: 10

## 2013-09-15 MED FILL — LOVENOX 40 MG/0.4ML SC SOLN: 40 MG/0.4ML | SUBCUTANEOUS | Qty: 0.4

## 2013-09-15 MED FILL — PANTOPRAZOLE SODIUM 40 MG PO TBEC: 40 MG | ORAL | Qty: 1

## 2013-09-15 MED FILL — NICOTINE 21 MG/24HR TD PT24: 21 MG/24HR | TRANSDERMAL | Qty: 1

## 2013-09-15 NOTE — Discharge Instructions (Signed)
As tolerated

## 2013-09-15 NOTE — Progress Notes (Signed)
The patient is discharged today.  Went over the discharge instructions with patient. IV line removed without incident.  Telly monitor removed and taken back to cardiac room.  The patient had no questions at this time. The patient stated he understood all discharge instructions.  He is waiting on his mother to pick him up. AHunter LPN    Pt d/c'd to home with mother. Pt denies questions. Rx given for dilantin and instructed to f/u with neurology.  Electronically signed by Jaquita RectorKatherine Elizabeth Stewart, RN on 09/15/2013 at 1:45 PM

## 2013-09-15 NOTE — ED Provider Notes (Signed)
Attending Supervisory Note/Shared Visit   I have personally performed a face to face diagnostic evaluation on this patient. I have reviewed the mid-level???s findings and agree.  History and Exam by me shows known seizure disorder discharge today and placed on Keppra patient had another seizure out in public and was brought here   We drew a Keppra level and gave him Ativan IV and Keppra IV 500 mg   patient's Dilantin level is 20.6 the case was discussed with the neurologist who took care of him in the hospital and it was felt that he would need to be readmitted and Keppra was added to his regimen      (Please note that portions of this note were completed with a voice recognition program.  Efforts were made to edit the dictations but occasionally words are mis-transcribed.)    Soni Kegel Kennith MaesLYNN Gianna Calef, MD  Attending Emergency Physician    Ethelle Lyonavid Lynn Dannie Hattabaugh, MD  09/16/13 [phone]

## 2013-09-15 NOTE — ED Notes (Addendum)
Pt recently discharged from hospital secondary to seizure activity. Pt came into the ER with seizure activity. Pt restrained by staff per hospital policy for pt safety, security at bedside. Pt has abrasions noted to generalized body, knees, elbows, forearms.     Donato Schultzella M Sya Nestler, RN  09/16/13 0133    Donato Schultzella M Karalyne Nusser, RN  09/16/13 [phone]

## 2013-09-15 NOTE — ED Notes (Signed)
Pt family at bedside and supportive of care. Pt A & O x 3 at this time. Pt cooperative with care at this time, confusion at times noted. Pt stays in bed and redirectable. No s/s of distress. Pt on monitor with no changes, VSS.     Donato Schultzella M Brewster Wolters, RN  09/16/13 [phone]

## 2013-09-15 NOTE — ED Provider Notes (Signed)
[facility]  eMERGENCY dEPARTMENT eNCOUnter      Pt Name: Roy Weaver  MRN: [id number]  Birthdate 1985/08/26  Date of evaluation: 09/15/2013  Provider: Maple MirzaAnna A Plasterer, PA    CHIEF COMPLAINT       Chief Complaint   Patient presents with   ??? Seizures     HISTORY OF PRESENT ILLNESS  (Location/Symptom, Timing/Onset, Context/Setting, Quality, Duration, Modifying Factors, Severity.)   Roy Weaver is a 28 y.o. male who presents to the emergency department via police escort after having a seizure.  Patient was seen in our ER 2 days ago after having a seizure.  Patient follows up with his neurologist Dr. Glean Hessajan.  Patient was admitted 2 days ago and was kept overnight in the hospital.  At that time he had neurology follow-up.  They recommended the patient continue his long-acting Dilantin do not feel with the surgeons start Keppra at that time.  Patient was then discharged on 3 PM today.  Patient states around 5 PM he decided to walk to the store.  He states he woke whenever he was found by police.  Does not recall what happened prior to that.  Patient has abrasions over his knees and knuckles.  Patient also bit his upper lip.  Patient denies headache, chest pain, shortness of breath.  Patient states that he had felt weird all afternoon after being discharged from the hospital.  Patient denies any bowel or bladder incontinence.  Patient denies any vomiting or nausea.  Patient denies any diarrhea.  Does any fevers or chills or dysuria.      Nursing Notes were reviewed and I agree.    REVIEW OF SYSTEMS    (2-9 systems for level 4, 10 or more for level 5)     Review of Systems   Constitutional: Negative for fever and chills.   Eyes: Negative.    Respiratory: Negative for cough, shortness of breath and wheezing.    Cardiovascular: Negative for chest pain and palpitations.   Gastrointestinal: Negative for nausea, vomiting, abdominal pain and diarrhea.   Genitourinary: Negative for dysuria and decreased  urine volume.   Musculoskeletal: Negative for back pain, neck pain and neck stiffness.   Skin: Positive for wound. Negative for color change and rash.   Neurological: Positive for seizures. Negative for dizziness, weakness, numbness and headaches.   Psychiatric/Behavioral: Positive for agitation. Negative for behavioral problems and confusion.   All other systems reviewed and are negative.      Except as noted above the remainder of the review of systems was reviewed and negative.       PAST MEDICAL HISTORY         Diagnosis Date   ??? Seizures (HCC)    ??? Migraine        SURGICAL HISTORY     History reviewed. No pertinent past surgical history.    CURRENT MEDICATIONS       Current Discharge Medication List      CONTINUE these medications which have NOT CHANGED    Details   phenytoin (DILANTIN) 200 MG ER capsule Take 200 mg ( 1 tablet in am ) and 400 mg ( 2 tablets in pm )  Qty: 90 capsule, Refills: 3      nicotine (NICODERM CQ) 21 MG/24HR Place 1 patch onto the skin every 24 hours.  Qty: 30 patch, Refills: 3    Associated Diagnoses: Smoker  ALLERGIES     Review of patient's allergies indicates no known allergies.    FAMILY HISTORY           Problem Relation Age of Onset   ??? Diabetes Father      Family Status   Relation Status Death Age   ??? Father Alive    ??? Mother Alive         SOCIAL HISTORY      reports that he has been smoking Cigarettes.  He has a 1 pack-year smoking history. He has never used smokeless tobacco. He reports that he does not drink alcohol or use illicit drugs.    PHYSICAL EXAM    (up to 7 for level 4, 8 or more for level 5)   ED Triage Vitals   BP Temp Temp src Pulse Resp SpO2 Height Weight   09/15/13 2211 -- -- 09/15/13 2211 09/15/13 2211 09/15/13 2211 -- --   115/63 mmHg   101 24 95 %         Physical Exam   Constitutional: He is oriented to person, place, and time. He appears well-developed and well-nourished. No distress.   HENT:   Head: Normocephalic.       Eyes: Pupils are equal,  round, and reactive to light.   Neck: Normal range of motion.   Cardiovascular: Normal rate, regular rhythm and normal heart sounds.    Pulmonary/Chest: Effort normal and breath sounds normal. No respiratory distress. He has no wheezes.   Abdominal: Soft. There is no tenderness.   Musculoskeletal:        Hands:       Legs:  Neurological: He is alert and oriented to person, place, and time. No cranial nerve deficit. Coordination normal.   Skin: Skin is warm and dry. There is erythema.   Psychiatric: He has a normal mood and affect. His behavior is normal.   Nursing note and vitals reviewed.        DIAGNOSTIC RESULTS     EKG: All EKG's are interpreted by Rae Roam, PA in the absence of a cardiologist.    Revealed normal sinus rhythm without ST segment elevation or depression as interpreted by me.    RADIOLOGY:   Non-plain film images such as CT, Ultrasound and MRI are read by the radiologist. Plain radiographic images are visualized and preliminarily interpreted by Rae Roam, PA with the below findings:    Revealed no acute intracranial abnormality as interpreted by the [facility] radiologist.    Interpretation per the Radiologist below, if available at the time of this note:    CT HEAD WO CONTRAST    Final Result: IMPRESSION:     1. No acute intracranial abnormality.          The preliminary findings were communicated to the emergency room    by the on-call radiologist.         LABS:  Labs Reviewed   LEVETIRACETAM LEVEL - Abnormal; Notable for the following:     Levetiracetam Lvl <2.0 (*)     All other components within normal limits   CBC WITH AUTO DIFFERENTIAL - Abnormal; Notable for the following:     Hematocrit 39.3 (*)     All other components within normal limits   COMPREHENSIVE METABOLIC PANEL - Abnormal; Notable for the following:     Potassium 3.4 (*)     CREATININE 0.8 (*)     All other components within normal limits   URINE RT  REFLEX TO CULTURE - Abnormal; Notable for the following:      Ketones, Urine TRACE (*)     Protein, UA 30 (*)     All other components within normal limits   URINE DRUG SCREEN - Abnormal; Notable for the following:     Cannabinoid Scrn, Ur POSITIVE (*)     All other components within normal limits   PHENYTOIN LEVEL, TOTAL - Abnormal; Notable for the following:     Phenytoin Lvl 20.5 (*)     All other components within normal limits   MICROSCOPIC URINALYSIS - Abnormal; Notable for the following:     Casts UA 0-1 Hyaline (*)     Mucus, UA 3+ (*)     WBC, UA 6 (*)     All other components within normal limits   URINE CULTURE   TROPONIN   URINALYSIS WITH MICROSCOPIC   POCT GLUCOSE       All other labs were within normal range or not returned as of this dictation.    EMERGENCY DEPARTMENT COURSE and DIFFERENTIAL DIAGNOSIS/MDM:   Vitals:    Filed Vitals:    09/16/13 0304 09/16/13 0804 09/16/13 1007 09/16/13 1232   BP: 101/64 115/73  128/65   Pulse: 78 76 75 86   Temp: 97.8 ??F (36.6 ??C) 98.2 ??F (36.8 ??C)  99.7 ??F (37.6 ??C)   TempSrc: Axillary Oral  Oral   Resp: 18 18  18    Height:       Weight:       SpO2: 97% 97%  96%       Upon presentation patient is very agitated.  Patient states he remembers what happened surrounding the events that led to his arrival here.  Patient begins to rise in the bed.  Patient was given 2 mg of IM Ativan and continued to be agitated and thrashing and yelling and screaming.  Patient had soft restraints placed.  Patient was given 2 more milligrams of IM Ativan and is now resting comfortably. Patient states he decided to go to the store this afternoon around 5 PM and was walking there states that he woke up when the police were surrounding him.  Patient was admitted to our hospital and discharged from 3 PM this afternoon. Patient states he remembers feeling weird and having a little bit of a headache before it happened.  Patient states that he has been taking his medications as prescribed.  Patient states when he was admitted here he was consulted by the  neurologist who stated that he should continue his long-acting Dilantin (Pheneytex) but did not need the Keppra at this point in time.  I consulted with this neurologist Dr. Ladona HornsPaul Wasielewski.  He recommended that the patient be admitted for observation and further evaluation.  He also recommended we give the patient 1000 of Keppra here in the ED. And recommended that the patient received 750 b.i.d. of Keppra during his admission. Consult to the hospitalist for admission.  Patient is neuro logically intact during his stay here in the ED and had no seizures.    This patient was seen by the attending physician and they agreed with the assessment and plan.    CONSULTS:  IP CONSULT TO NEUROLOGY  IP CONSULT TO HOSPITALIST    PROCEDURES:  None    FINAL IMPRESSION      1. Recurrent seizures (HCC)          DISPOSITION/PLAN   DISPOSITION Admitted    PATIENT REFERRED TO:  Nisar Karmen Stabs, MD  [address] [place] [address]            DISCHARGE MEDICATIONS:  Current Discharge Medication List          (Please note that portions of this note were completed with a voice recognition program.  Efforts were made to edit the dictations but occasionally words are mis-transcribed.)    Rae Roam, PA      Rae Roam, [place]  09/16/13 1252

## 2013-09-15 NOTE — Discharge Instructions (Signed)
May return to work on Monday 09/17/13.  No driving.  Follow up with Neurology.

## 2013-09-15 NOTE — Discharge Summary (Signed)
Hospital Discharge Summary    Patient's PCP: [name], MD  Admit Date: 09/13/2013   Discharge Date: 09/15/2013    Admitting Physician: Dr. Kelly SplinterKerry A Lecky, MD   Discharge Physician: Dr. Shelton Silvasesar Niraj Kudrna MD    Consults:     IP CONSULT TO HOSPITALIST  IP CONSULT TO NEUROLOGY    Discharge Diagnoses:   Patient Active Problem List   Diagnosis   ??? Seizures (HCC)   ??? Migraine   ??? Recurrent seizures (HCC)   ??? History of nonadherence to medical treatment     1. Acute metab encephalopathy  2. Seizure disorder   Seizure activity    Low levels     Hospital Course:  The patient is a 28 y.o. male w/ hx of seizure disorder non adherent to meds who presents to [facility] with acute grand mal seizure w/ post ictal state. Had another seizure in ER. Loss of bladder fxn. Received ativan in ER. Non adherent to dilantin regimen. Recently switched outpt to keppra but has not yet filled scripts. I have ordered 1g IV dilantin and keppra once the pt arrived on the floor. He is being admitted for further evaluation  CT head normal  Started on keppra then switched to dilantin   Increased dose as home levels were low    Seen by neurology   Final recs     1. Seizure disorder exacerbation due to subtherapeutic dosing; I believe the pt that he is compliant with Phenytek (the medication was present on testing in the ED- just subtherapeutic). He just needs a greater daily dose of Phenytek; Keppra is not necessary.       Recommendations:  1. Increase daily total dose of Phenytek (200 mg ER Dilantin capsule) to 600mg  taken as: One capsule in the AM, two capsules HS.     2. Follow-up with his treating neurologist.       No new seizures   Normal mentation   No fever   Normal ambulation       Discharge planning and medical issues as well as  Alarm and precautions were discussed at length with the pt and/or relatives/POA    Significant Diagnostic Studies:  As above      Treatments: As above      Disposition: home    Discharged Condition:  Stable    Follow Up: Primary Care Physician in two weeks    Discharge Medications:   Annell Greeningierre, Mechel J   Home Medication Instructions RUE:[id number]:[id number]    Printed on:09/15/13 1153   Medication Information                      nicotine (NICODERM CQ) 21 MG/24HR  Place 1 patch onto the skin every 24 hours.             phenytoin (DILANTIN) 200 MG ER capsule  Take 200 mg ( 1 tablet in am ) and 400 mg ( 2 tablets in pm )                  Activity: activity as tolerated     Diet: regular diet    Wound Care: none needed      Total time spent on discharge, finalizing medications, referrals and arranging outpatient follow up was more than 30 minutes    Thank you Dr. [name], MD for the opportunity to be involved in this patients care. If you have any questions or concerns please feel free  to contact me at [phone].    Shelton Silvasesar Minh Roanhorse MD

## 2013-09-15 NOTE — Discharge Instructions (Signed)
General diet

## 2013-09-16 ENCOUNTER — Inpatient Hospital Stay
Admit: 2013-09-16 | Disposition: A | Discharge status: Home / Self Care | Payer: MEDICAID | Source: Home / Self Care | Admitting: Nephrology

## 2013-09-16 LAB — URINALYSIS WITH REFLEX TO CULTURE
Bilirubin Urine: NEGATIVE
Blood, Urine: NEGATIVE
Glucose, Ur: NEGATIVE mg/dL
Leukocyte Esterase, Urine: NEGATIVE
Nitrite, Urine: NEGATIVE
Protein, UA: 30 mg/dL — AB
Specific Gravity, UA: 1.03 (ref 1.005–1.030)
Urobilinogen, Urine: 1 E.U./dL (ref <2.0)
pH, UA: 6 (ref 5.0–8.0)

## 2013-09-16 LAB — COMPREHENSIVE METABOLIC PANEL
ALT: 10 U/L (ref 10–40)
AST: 27 U/L (ref 15–37)
Albumin/Globulin Ratio: 1.3 (ref 1.1–2.2)
Albumin: 4.2 g/dL (ref 3.4–5.0)
Alkaline Phosphatase: 119 U/L (ref 40–129)
Anion Gap: 14 (ref 3–16)
BUN: 8 mg/dL (ref 7–20)
CO2: 24 mmol/L (ref 21–32)
Calcium: 8.7 mg/dL (ref 8.3–10.6)
Chloride: 100 mmol/L (ref 99–110)
Creatinine: 0.8 mg/dL — ABNORMAL LOW (ref 0.9–1.3)
GFR African American: >60 (ref >60)
GFR Non-African American: >60 (ref >60)
Globulin: 3.3 g/dL
Glucose: 85 mg/dL (ref 70–99)
Potassium: 3.4 mmol/L — ABNORMAL LOW (ref 3.5–5.1)
Sodium: 138 mmol/L (ref 136–145)
Total Bilirubin: 0.3 mg/dL (ref 0.0–1.0)
Total Protein: 7.5 g/dL (ref 6.4–8.2)

## 2013-09-16 LAB — URINE DRUG SCREEN
Amphetamine Screen, Urine: NEGATIVE
Amphetamine Screen, Urine: NEGATIVE
Barbiturate Screen, Ur: NEGATIVE (ref <200)
Barbiturate Screen, Ur: NEGATIVE (ref <200)
Benzodiazepine Screen, Urine: NEGATIVE (ref <200)
Benzodiazepine Screen, Urine: NEGATIVE (ref <200)
Cannabinoid Scrn, Ur: POSITIVE — AB (ref <50)
Cannabinoid Scrn, Ur: NEGATIVE (ref <50)
Cocaine Metabolite Screen, Urine: NEGATIVE (ref <300)
Cocaine Metabolite Screen, Urine: NEGATIVE (ref <300)
Methadone Screen, Urine: NEGATIVE (ref <300)
Methadone Screen, Urine: NEGATIVE (ref <300)
Opiate Scrn, Ur: NEGATIVE (ref <300)
Opiate Scrn, Ur: NEGATIVE (ref <300)
PCP Screen, Urine: NEGATIVE (ref <25)
PCP Screen, Urine: NEGATIVE (ref <25)
Propoxyphene Scrn, Ur: NEGATIVE (ref <300)
Propoxyphene Scrn, Ur: NEGATIVE (ref <300)
pH, UA: 6
pH, UA: 5

## 2013-09-16 LAB — LEVETIRACETAM LEVEL: Levetiracetam Lvl: <2 ug/mL — ABNORMAL LOW (ref 6.0–46.0)

## 2013-09-16 LAB — CBC WITH AUTO DIFFERENTIAL
Basophils %: 0.3 %
Basophils Absolute: 0 10*3/uL (ref 0.0–0.2)
Eosinophils %: 1.9 %
Eosinophils Absolute: 0.1 10*3/uL (ref 0.0–0.6)
Hematocrit: 39.3 % — ABNORMAL LOW (ref 40.5–52.5)
Hemoglobin: 13.6 g/dL (ref 13.5–17.5)
Lymphocytes %: 17.5 %
Lymphocytes Absolute: 1 10*3/uL (ref 1.0–5.1)
MCH: 31.4 pg (ref 26.0–34.0)
MCHC: 34.5 g/dL (ref 31.0–36.0)
MCV: 91.1 fL (ref 80.0–100.0)
MPV: 7.4 fL (ref 5.0–10.5)
Monocytes %: 7.9 %
Monocytes Absolute: 0.4 10*3/uL (ref 0.0–1.3)
Neutrophils %: 72.4 %
Neutrophils Absolute: 3.9 10*3/uL (ref 1.7–7.7)
Platelets: 174 10*3/uL (ref 135–450)
RBC: 4.31 M/uL (ref 4.20–5.90)
RDW: 12.7 % (ref 12.4–15.4)
WBC: 5.4 10*3/uL (ref 4.0–11.0)

## 2013-09-16 LAB — EKG 12-LEAD
Atrial Rate: 104 {beats}/min
P Axis: 50 degrees
P-R Interval: 190 ms
Q-T Interval: 342 ms
QRS Duration: 84 ms
QTc Calculation (Bazett): 449 ms
R Axis: 79 degrees
T Axis: 50 degrees
Ventricular Rate: 104 {beats}/min

## 2013-09-16 LAB — MICROSCOPIC URINALYSIS
Epithelial Cells, UA: 3 /HPF (ref 0–5)
RBC, UA: 2 /HPF (ref 0–4)
WBC, UA: 6 /HPF — ABNORMAL HIGH (ref 0–5)

## 2013-09-16 LAB — TROPONIN: Troponin: <0.01 ng/mL (ref <0.01)

## 2013-09-16 LAB — PHENYTOIN LEVEL, TOTAL: Phenytoin Lvl: 20.5 ug/mL — ABNORMAL HIGH (ref 10.0–20.0)

## 2013-09-16 MED ORDER — HALOPERIDOL LACTATE 5 MG/ML IJ SOLN
5 MG/ML | INTRAMUSCULAR | Status: DC | PRN
Start: 2013-09-16 — End: 2013-09-18

## 2013-09-16 MED ORDER — ACETAMINOPHEN 325 MG PO TABS
325 | ORAL | Status: DC | PRN
Start: 2013-09-16 — End: 2013-09-18

## 2013-09-16 MED ORDER — MAGNESIUM HYDROXIDE 400 MG/5ML PO SUSP
400 | Freq: Every day | ORAL | Status: DC | PRN
Start: 2013-09-16 — End: 2013-09-18

## 2013-09-16 MED ORDER — LORAZEPAM 2 MG/ML IJ SOLN
2 | INTRAMUSCULAR | Status: AC
Start: 2013-09-16 — End: 2013-09-16

## 2013-09-16 MED ORDER — LORAZEPAM 2 MG/ML IJ SOLN
2 | Freq: Four times a day (QID) | INTRAMUSCULAR | Status: DC | PRN
Start: 2013-09-16 — End: 2013-09-18
  Administered 2013-09-16 – 2013-09-17 (×2): 1 mg via INTRAVENOUS

## 2013-09-16 MED ORDER — NORMAL SALINE FLUSH 0.9 % IV SOLN
0.9 | Freq: Two times a day (BID) | INTRAVENOUS | Status: DC
Start: 2013-09-16 — End: 2013-09-18
  Administered 2013-09-16 – 2013-09-18 (×3): 10 mL via INTRAVENOUS

## 2013-09-16 MED ORDER — PHENYTOIN SODIUM EXTENDED 100 MG PO CAPS
100 | Freq: Every day | ORAL | Status: DC
Start: 2013-09-16 — End: 2013-09-17
  Administered 2013-09-16 – 2013-09-17 (×2): 200 mg via ORAL

## 2013-09-16 MED ORDER — LEVETIRACETAM IN NACL 500 MG/100ML IV SOLN
500 | Freq: Two times a day (BID) | INTRAVENOUS | Status: DC
Start: 2013-09-16 — End: 2013-09-16
  Administered 2013-09-16: 16:00:00 500 mg via INTRAVENOUS

## 2013-09-16 MED ORDER — NORMAL SALINE FLUSH 0.9 % IV SOLN
0.9 | INTRAVENOUS | Status: DC | PRN
Start: 2013-09-16 — End: 2013-09-18

## 2013-09-16 MED ORDER — ENOXAPARIN SODIUM 40 MG/0.4ML SC SOLN
40 | Freq: Every day | SUBCUTANEOUS | Status: DC
Start: 2013-09-16 — End: 2013-09-18
  Administered 2013-09-16 – 2013-09-18 (×3): 40 mg via SUBCUTANEOUS

## 2013-09-16 MED ORDER — ONDANSETRON HCL 4 MG/2ML IJ SOLN
4 | Freq: Four times a day (QID) | INTRAMUSCULAR | Status: DC | PRN
Start: 2013-09-16 — End: 2013-09-18

## 2013-09-16 MED ADMIN — levetiracetam (KEPPRA) 500 mg/100 mL IVPB: 500 mg | INTRAVENOUS | @ 03:00:00 | NDC 67457025500

## 2013-09-16 MED ADMIN — LORazepam (ATIVAN) injection 2 mg: 2 mg | INTRAVENOUS | @ 18:00:00 | NDC 00641604425

## 2013-09-16 MED ADMIN — 0.9 % sodium chloride infusion: INTRAVENOUS | @ 21:00:00 | NDC 00338004904

## 2013-09-16 MED ADMIN — 0.9 % sodium chloride bolus: 1000 mL | INTRAVENOUS | @ 03:00:00 | NDC 00338004904

## 2013-09-16 MED FILL — LOVENOX 40 MG/0.4ML SC SOLN: 40 MG/0.4ML | SUBCUTANEOUS | Qty: 0.4

## 2013-09-16 MED FILL — LORAZEPAM 2 MG/ML IJ SOLN: 2 MG/ML | INTRAMUSCULAR | Qty: 1

## 2013-09-16 MED FILL — LEVETIRACETAM IN NACL 500 MG/100ML IV SOLN: 500 MG/100ML | INTRAVENOUS | Qty: 100

## 2013-09-16 MED FILL — PHENYTOIN SODIUM EXTENDED 100 MG PO CAPS: 100 MG | ORAL | Qty: 2

## 2013-09-16 MED FILL — SODIUM CHLORIDE 0.9 % IV SOLN: 0.9 % | INTRAVENOUS | Qty: 1000

## 2013-09-16 NOTE — ED Notes (Signed)
No seizure activity noted. Pt resp even and easy. Pt on monitor with no changes noted.     Roy Schultzella M Demesha Boorman, RN  09/16/13 [phone]

## 2013-09-16 NOTE — Progress Notes (Signed)
Bedside nurse called to room for possible seizure activity. Pt in bed moaning, stiffening of muscles, and complaining of pain in bilat arms and legs. Pt given 2mg  of ativan per Dr. Ninfa LindenAhmad who is at bedside. Neurologist also at bedside during this activity. Pt's father in room and pt becoming more agitated and upset. Bedside nurse asked father to step out of the room. 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 Nurse talked with pt and made him aware that MD must be informed. Pt relaxed in bed, will continue to monitor and assess. Electronically signed by Gery Prayiffany M Kaydin Karbowski, RN on 09/16/2013 at 4:06 PM

## 2013-09-16 NOTE — Plan of Care (Signed)
Problem: Falls - Risk of  Goal: Absence of falls  Outcome: Ongoing  Fall risk assessment completed every shift. All precautions in place. Pt has call light within reach at all times. Room clear of clutter. Pt aware to call for assistance when getting up.

## 2013-09-16 NOTE — Plan of Care (Signed)
Problem: Falls - Risk of  Goal: Absence of falls  Outcome: Ongoing  Fall risk assessment completed every shift. All precautions in place. Pt has call light within reach at all times. Room clear of clutter. Pt aware to call for assistance when getting up.  Seizure precautions in place

## 2013-09-16 NOTE — Progress Notes (Signed)
Pt alert and oriented. AM meds complete. Pt tolerated well. VS stable and WDL. No s/s of distress or seizure activity. No further needs noted at this time. Will continue to monitor and assess. Electronically signed by Gery Prayiffany M Tatyana Biber, RN on 09/16/2013 at 10:42 AM

## 2013-09-16 NOTE — Progress Notes (Signed)
Department of Neurological Sciences  Section of General Neurology - Adult  Attending Progress Note      SUBJECTIVE:  Asked to see pt regarding possible seizure vs non-epileptic spell.  I know the pt from his most recent admission.  Brought to ED last night after police found him down; his dilantin level was 20.5.      Today he has had two episodes that were both precipitated by anger per discussion with nursing staff.  The spells consisted of him becoming anxious and stiffening his extremities, trying to get out of bed, moaning, and being able to speak during each spell.  He was given 2 mg Ativan for each spell.      After his father left, he confided to the nursing staff that he has a hx of using Heroin (quit or still using?) and he was with a friend last night and they used some type of drug that he is unsure of.      OBJECTIVE:      PHYSICAL EXAM:    VITALS:  BP 128/65   Pulse 86   Temp(Src) 99.7 F (37.6 C) (Oral)   Resp 18   Ht 6\' 4"  (1.93 m)   Wt 167 lb 5.3 oz (75.9 kg)   BMI 20.38 kg/m2     SpO2 96%     He is currently resting; during the spell I witnessed he was speaking, answering questions while he was moaning and moving around in the bed.      DATA        ASSESSMENT AND PLAN  1.  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      2.  I am concerned about the possibility of a withdrawal syndrome    Recs:  1.  Urine toxicology screen    2.  Continued prn ativan and/or Haldol    3.  Psychiatry consult    4.  Social work involvement    5.  Will d/c Keppra    6.  Continue with Dilantin for now.     Ivan AnchorsPaul G. Clovis FredricksonWasielewski, MD  Neurohospitalist

## 2013-09-16 NOTE — ED Notes (Signed)
Report called to WolverineStephanie, Charity fundraiserN. Pt going to room 3104 via stretcher and ED tech.     Donato Schultzella M Shanikqua Zarzycki, RN  09/16/13 [phone]

## 2013-09-16 NOTE — Progress Notes (Signed)
Pt woke from morning nap and started having anxiety attack. During attack pt was crying out but was A&O and stated he had no pain. Bedside nurse placed call to MD during this time. Pt's father in room states this is not pt normal seizure activity like home. After 5 minutes of pt crying out pt started to have tense muscles and contracting which started at 1202, bedside nurse called charge nurse to assist. Charge nurse pulled 1MG  ativan from pxysis and administered to pt at 1208. Seizure activity stopped at 1211. Bedside nurse hung IV keppra and pt is asleep at this time. Father at bedside, will continue to monitor. Electronically signed by Gery Prayiffany M Severa Jeremiah, RN on 09/16/2013 at 12:24 PM

## 2013-09-16 NOTE — H&P (Signed)
Hospitalist  History and Physical    Patient:  Roy Weaver  MRN: [id number]  PCP: Roy JamaicaNISAR FATIMA HAQ, MD    CHIEF COMPLAINT:  Complain of a headache      HISTORY OF PRESENT ILLNESS:   The patientErnest Christain SacramentoJ Weaver is a 28 y.o. male with medical history significant for migraine headaches and seizure disorder.  Patient presented to the emergency room 2nd time in the lost 3 days when he was brought in for a seizure disorder. Patient reports that he has been taking his medication on regular basis but the father reports that most likely he has not been taking his medication.  Reports that his migraine has been under good control for the last 5 years but he experienced a migraine headache prior to the seizure disorder.patient also has abrasions on the LEFT forearm and LEFT hand on the palmar surface.  Patient has bruising and swelling of the LEFT upper lip. Patient denies any fever, chills . No complaint of chest pain,  no C/O of nausea vomiting or diarrhea.   Pt Denies history of hematemesis or melena, no history of urinary symptoms.        Past Medical History:        Diagnosis Date   ??? Seizures (HCC)    ??? Migraine        Past Surgical History:    History reviewed. No pertinent past surgical history.    Medications Prior to Admission:    Prior to Admission medications    Medication Sig Start Date End Date Taking? Authorizing Provider   phenytoin (DILANTIN) 200 MG ER capsule Take 200 mg ( 1 tablet in am ) and 400 mg ( 2 tablets in pm ) 09/15/13   Shelton Silvasesar Abuchaibe, MD   nicotine (NICODERM CQ) 21 MG/24HR Place 1 patch onto the skin every 24 hours. 08/30/13 08/30/14  Nisar Karmen StabsFatima Haq, MD       Allergies:  Review of patient's allergies indicates no known allergies.    Social History:   TOBACCO:   reports that he has been smoking Cigarettes.  He has a 1 pack-year smoking history. He has never used smokeless tobacco.  ETOH:   reports that he does not drink alcohol.    Family History:       Problem Relation Age of Onset   ??? Diabetes  Father        REVIEW OF SYSTEMS: positive findings are in bold.    CONSTITUTIONAL:        fatigue, fever, chills or night sweats, recent weight gain, recent wt loss, insomnia,  General weakness, poor appetite, muscle aches and pains    HEAD: headache, dizziness    EYES:      blurriness,  double vision, dryness,  discharge, irritation,diplopia    EARS:      hearing loss, vertigo, ear discharge,  Earache. Ringing in the ears.    NOSE:      Rhinorrhea, sneezing, epistaxis. Discharge, sinusitis,     MOUTH/THROAT:       sore throat, mouth ulcers, Hoarseness    RESPIRATORY:      Shortness of breath, wheezing,  cough, sputum, hemoptysis, obstructive sleep apnea,    CARDIOVASCULAR :    chest pain, palpitations, dyspnea on exercise, Lower extrimity edema (swelling),     GASTROINTESTINAL:       Dysphagia, Poor appetite,  Nausea, Vomiting, diarrhea, heartburn, abdominal pain. Blood in the stools, hematemesis. Pain with swallowing, constipation    GENITOURINARY:  Urinary frequency, hesitancy,  urgency, Dysuria, hematuria,  Urinary Incontinence. Urinary Retention.  GYNECOLOGICAL: vaginal bleeding , vaginal discharge, menopause    MUSCULOSKELETAL:     joint swelling or stiffness, joint pain, muscle pain, balance problems, low back pain.    NEUROLOGICAL:    Gait problems. Tremor. Dizziness. Pain and paresthesias, weakness in extremities. Seizures, memory loss    PSYCHLOGICAL:        Anxiety, depression    SKIN :      Rashes, ulcers, concerning lesions,Pruritus, hair loss, skin color changes, easy bruisability, lymphadenopathy      Physical Exam:      Vitals: BP 115/73    Pulse 76    Temp(Src) 98.2 ??F (36.8 ??C) (Oral)    Resp 18    Ht 6\' 4"  (1.93 m)    Wt 167 lb 5.3 oz (75.9 kg)    BMI 20.38 kg/m2      SpO2 97%     Gen:          Alert and oriented x2  Eyes: PERRL. No sclera icterus. No conjunctival injection.   ENT: No discharge. Pharynx clear. External appearance of ears and nose normal.  Neck: Trachea midline. No obvious mass.     Resp: No accessory muscle use. No crackles. No wheezes. No rhonchi.  CV:2/6 systolic ejection murmur.  GI: Non-tender. Non-distended. No hernia.   Skin: Warm, dry, normal texture and turgor.   Lymph: No cervical LAD. No supraclavicular LAD.   M/S: / Ext. No cyanosis. No clubbing. No joint deformity.    Neuro: Moves all four extremities. CN 2-12 tested, no deficits noted.      CBC:   Recent Labs      09/13/13   1650  09/15/13   2200   WBC  4.0  5.4   HGB  14.4  13.6   PLT  211  174     BMP:    Recent Labs      09/13/13   1650  09/14/13   0440  09/15/13   2200   NA  139  137  138   K  4.0  4.2  3.4*   CL  98*  100  100   CO2  25  23  24    BUN  10  8  8    CREATININE  0.7*  0.8*  0.8*   GLUCOSE  78  86  85     Hepatic:   Recent Labs      09/13/13   1650  09/15/13   2200   AST  18  27   ALT  10  10   BILITOT  0.3  0.3   ALKPHOS  124  119     Troponin:   Recent Labs      09/13/13   1650  09/15/13   2200   TROPONINI  <0.01  <0.01     BNP: No results found for this basename: BNP,  in the last 72 hours  INR:   Recent Labs      09/13/13   1650   INR  1.18*     No results found for this basename: laba1c           No results found for this basename: CKTOTAL,  in the last 72 hours  -----------------------------------------------------------------  CT Head  No acute intracranial abnormality.  XR Chest  No acute abnormality.  CT Head  No acute intracranial abnormality.  ECHO:        Assessment / Plan     Seizure disorder  Continue on Dilantin  Consults neurology    History of drug abuse  Use Haldol and Ativan on as needed basis for agitation and tremors  or seizures  Continue on IV fluids        DVT and GI prophylaxis              Full Code      Mays Paino RIAZ Nyquan Selbe M.D

## 2013-09-16 NOTE — Progress Notes (Signed)
Pt is alert and oriented and in bed. Bed in lowest position, wheels locked, bed rails up 2/4, bed alarm on. VSS and WDL. No s/s of distress. Family at bedside. Will continue to monitor and assess. Electronically signed by Gery Prayiffany M Freddye Cardamone, RN on 09/16/2013 at 4:19 PM

## 2013-09-17 LAB — CBC WITH AUTO DIFFERENTIAL
Basophils %: 0.7 %
Basophils Absolute: 0 10*3/uL (ref 0.0–0.2)
Eosinophils %: 6.1 %
Eosinophils Absolute: 0.2 10*3/uL (ref 0.0–0.6)
Hematocrit: 39.7 % — ABNORMAL LOW (ref 40.5–52.5)
Hemoglobin: 13.4 g/dL — ABNORMAL LOW (ref 13.5–17.5)
Lymphocytes %: 35.5 %
Lymphocytes Absolute: 1.3 10*3/uL (ref 1.0–5.1)
MCH: 31.4 pg (ref 26.0–34.0)
MCHC: 33.8 g/dL (ref 31.0–36.0)
MCV: 92.7 fL (ref 80.0–100.0)
MPV: 7.6 fL (ref 5.0–10.5)
Monocytes %: 10.9 %
Monocytes Absolute: 0.4 10*3/uL (ref 0.0–1.3)
Neutrophils %: 46.8 %
Neutrophils Absolute: 1.7 10*3/uL (ref 1.7–7.7)
Platelets: 159 10*3/uL (ref 135–450)
RBC: 4.28 M/uL (ref 4.20–5.90)
RDW: 12.9 % (ref 12.4–15.4)
WBC: 3.7 10*3/uL — ABNORMAL LOW (ref 4.0–11.0)

## 2013-09-17 LAB — URINALYSIS WITH MICROSCOPIC
Bilirubin Urine: NEGATIVE
Blood, Urine: NEGATIVE
Epithelial Cells, UA: 2 /HPF (ref 0–5)
Glucose, Ur: NEGATIVE mg/dL
Hyaline Casts, UA: 10 /HPF — ABNORMAL HIGH (ref 0–8)
Ketones, Urine: 15 mg/dL — AB
Leukocyte Esterase, Urine: NEGATIVE
Nitrite, Urine: NEGATIVE
RBC, UA: 1 /HPF (ref 0–4)
Specific Gravity, UA: 1.028 (ref 1.005–1.030)
Urobilinogen, Urine: 1 E.U./dL (ref <2.0)
WBC, UA: 6 /HPF — ABNORMAL HIGH (ref 0–5)
WBC, UA: NONE SEEN /HPF (ref 0–5)
pH, UA: 6 (ref 5.0–8.0)

## 2013-09-17 LAB — BASIC METABOLIC PANEL
Anion Gap: 14 (ref 3–16)
BUN: 7 mg/dL (ref 7–20)
CO2: 23 mmol/L (ref 21–32)
Calcium: 8.4 mg/dL (ref 8.3–10.6)
Chloride: 102 mmol/L (ref 99–110)
Creatinine: 0.8 mg/dL — ABNORMAL LOW (ref 0.9–1.3)
GFR African American: >60 (ref >60)
GFR Non-African American: >60 (ref >60)
Glucose: 66 mg/dL — ABNORMAL LOW (ref 70–99)
Potassium: 3.9 mmol/L (ref 3.5–5.1)
Sodium: 139 mmol/L (ref 136–145)

## 2013-09-17 MED ORDER — OXYCODONE-ACETAMINOPHEN 5-325 MG PO TABS
5-325 | ORAL | Status: DC | PRN
Start: 2013-09-17 — End: 2013-09-18

## 2013-09-17 MED ORDER — PHENYTOIN SODIUM EXTENDED 100 MG PO CAPS
100 MG | Freq: Two times a day (BID) | ORAL | Status: DC
Start: 2013-09-17 — End: 2013-09-18
  Administered 2013-09-18 (×2): 200 mg via ORAL

## 2013-09-17 MED ADMIN — oxyCODONE-acetaminophen (PERCOCET) 5-325 MG per tablet 1 tablet: 1 | ORAL | @ 18:00:00 | NDC 00406051223

## 2013-09-17 MED ADMIN — 0.9 % sodium chloride infusion: INTRAVENOUS | @ 04:00:00 | NDC 00338004904

## 2013-09-17 MED FILL — OXYCODONE-ACETAMINOPHEN 5-325 MG PO TABS: 5-325 MG | ORAL | Qty: 1

## 2013-09-17 MED FILL — LOVENOX 40 MG/0.4ML SC SOLN: 40 MG/0.4ML | SUBCUTANEOUS | Qty: 0.4

## 2013-09-17 MED FILL — PHENYTOIN SODIUM EXTENDED 100 MG PO CAPS: 100 MG | ORAL | Qty: 2

## 2013-09-17 MED FILL — SODIUM CHLORIDE 0.9 % IV SOLN: 0.9 % | INTRAVENOUS | Qty: 1000

## 2013-09-17 NOTE — Progress Notes (Addendum)
Hospitalist Progress Note  09/17/2013 8:44 AM    PCP: Roy JamaicaNISAR FATIMA HAQ, MD    [id number]     Date of Admission: 09/16/2013    Consults:     IP CONSULT TO NEUROLOGY  IP CONSULT TO HOSPITALIST  IP CONSULT TO PSYCHOLOGY    follow up of --The patientErnest J Alla Weaver is a 28 y.o. male with medical history significant for migraine headaches, IV drug abuse, and seizure disorder. Patient presented to the emergency room 2nd time in the last 3 days when he was brought in for a seizure episode.    Patient reports that he has been taking his medication on regular basis but the father reports that most likely he has not been taking his medication. Reports that his migraine has been under good control for the last 5 years but he experienced a migraine headache prior to the seizure disorder.patient also has abrasions on the LEFT forearm and LEFT hand on the palmar surface. Patient has bruising and swelling of the LEFT upper lip.       SUBJECTIVE COMPLAINTS- complain of abdominal discomfort      Diet: Diet NPO Effective Now      OBJECTIVE:   Patient Active Problem List   Diagnosis   ??? Seizures (HCC)   ??? Migraine   ??? Recurrent seizures (HCC)   ??? History of nonadherence to medical treatment       Allergies  Review of patient's allergies indicates no known allergies.    Medications    Scheduled Meds:  ??? phenytoin  200 mg Oral Daily   ??? sodium chloride flush  10 [facility]   ??? enoxaparin  40 mg Subcutaneous Daily   ??? LORazepam  2 mg Intramuscular Once   ??? LORazepam  2 mg Intramuscular Once     Continuous Infusions:  ??? sodium chloride 75 mL/hr at 09/17/13 0002     PRN Meds:  sodium chloride flush, acetaminophen, magnesium hydroxide, ondansetron, lorazepam, haloperidol lactate    Vitals   Vitals /wt Patient Vitals for the past 8 hrs:   BP Temp Temp src Pulse Resp SpO2   09/17/13 0745 124/68 mmHg 99 ??F (37.2 ??C) Oral 87 16 98 %   09/17/13 0400 118/69 mmHg 98 ??F (36.7 ??C) Axillary 63 16 98 %        72HR INTAKE/OUTPUT:     Intake/Output Summary (Last 24 hours) at 09/17/13 0844  Last data filed at 09/17/13 [id number]   Gross per 24 hour   Intake    758 ml   Output    350 ml   Net    408 ml       Exam:    Gen:   Alert and oriented ??3   Eyes: PERRL. No sclera icterus. No conjunctival injection.   ENT: No discharge. Pharynx clear. External appearance of ears and nose normal.  Neck: Trachea midline. No obvious mass.    Resp: No accessory muscle use. No crackles. No wheezes. No rhonchi.  CV: Regular rate. Regular rhythm. No murmur or rub. No edema.   GI: Diffuse abdominal tenderness  Skin: Warm, dry, normal texture and turgor.   Lymph: No cervical LAD. No supraclavicular LAD.   M/S: / Ext. No cyanosis. No clubbing. No joint deformity.    Neuro: CN 2-12 are intact,  no neurologic deficits noted.    PT/INR: No results found for this basename: PROTIME, INR,  in the last 72 hours  APTT: No results found for  this basename: APTT,  in the last 72 hours    CBC: Recent Labs      09/15/13   2200  09/17/13   0423   WBC  5.4  3.7*   HGB  13.6  13.4*   HCT  39.3*  39.7*   MCV  91.1  92.7   PLT  174  159       BMP: Recent Labs      09/15/13   2200  09/17/13   0423   NA  138  139   K  3.4*  3.9   CL  100  102   CO2  24  23   BUN  8  7   CREATININE  0.8*  0.8*       LIVER PROFILE:   Recent Labs      09/15/13   2200   AST  27   ALT  10   BILITOT  0.3   ALKPHOS  119       UA:  Recent Labs      09/16/13   1630   WBCUA  None seen   6*   RBCUA  3-5*   1   MUCUS  1+*     TROPONIN:   Recent Labs      09/15/13   2200   TROPONINI  <0.01     Lab Results   Component Value Date    URRFLXCULT Yes 09/16/2013     No results found for this basename: TSHREFLEX       No components found with this basename: LAB5370     POC GLUCOSE:  No results found for this basename: POCGLU,  in the last 72 hours  No results found for this basename: LABA1C,  in the last 72 hours     CT Head  No acute intracranial abnormality.     ASSESSMENT AND PLAN    Seizure disorder  Continue on  Dilantin  Neurology consult is appreciated  EEG is normal    History of drug abuse  Neurology not convinced that patient has seizure disorder  Use Haldol and Ativan on as needed basis for agitation and tremors  or seizures    Patient remains confused  We will consult psychiatric service for possible psychiatric illness underlying patient's multiple drug abuse  Drug screen is only positive for marijuana  Patient does admit to using heroin in the past.    Ethanol abuse  Patient reports her use of alcohol and on intermittent basis  Continue with when necessary Ativan and Haldol    DVT and GI prophylaxis                      Code Status: Full Code        Dispo - Continue care        The patient and / or the family were informed of the results of any tests, a time was given to answer questions, a plan was proposed and they agreed with plan.    Roy Wyche RIAZ Jaeleah Smyser, MD    Coding guidelines  Problem points, Max needed = 4  New problem and workup planned  = 4 points  New problems, no further workup   =3  Established problem unstable         = 2   Established problems stable           = 1  Minor self-limited problem               =  1  Max 2

## 2013-09-17 NOTE — Behavioral Health Treatment Team (Signed)
Got a call on the psych consult line about this patient.  The call included no clinical information at all, nor did it include a clinical question.  It said rather that a psych consult had been called yesterday, and the caller seemed exasperated that the patient had not been seen yet.  There is an order in the chart about contacting psychology.  This raises a number of issues, including the fact that psychiatry and psychology are very different. Now that an order for psychiatry consultation has been put in, please have someone call the psychiatry consult line and leave some clinical information and a clinical question.  I will try to leave a post-it regarding ways to make sure patients and teams get the most of psychiatry consults.

## 2013-09-17 NOTE — Progress Notes (Signed)
Pt a/o. Pt called for the nurse. Came in pt talking about the last thing he remember " Gun does it have a click?"  RN stated to pt there is no gun. Asked questions to figure out if pt is a/o. Pt answer questions correctly name, date, place and current event. Will continue to monitor. .Electronically signed by Amadeo Garnethristina M Jaleya Pebley, RN on 09/17/2013 at 10:07 AM

## 2013-09-17 NOTE — Procedures (Signed)
PATIENT NAME:                   PA #:             MR #Roy Weaver, Roy Weaver                  [id number]        [id number]         ATTENDING PHYSICIAN:                    SERVICE DATE:  DIS DATE:       Curley SpiceAMITKUMAR M PATEL, MD                   09/17/2013                     DATE OF BIRTH:     AGE:            PATIENT TYPE:      RM #:           [id number]         27              IPK                [id number]               REQUESTING PHYSICIAN:  [name], MD.     INDICATION FOR THE EEG:  History of seizures and mental status change.     CLINICAL HISTORY:  The patient is a 28 year old African-American gentleman  who reports a history of seizures for which he is on Dilantin.     STUDY DETAILS:  This was a portable EEG obtained in the awake and drowsy  state with duration of 27 minutes during which hyperventilation and photic  stimulation were both performed as provocative measures.       TECHNICAL DESCRIPTION OF THE EEG:  1.  An occipital alpha rhythm was present at a frequency of 9 cycles per  second and this alpha rhythm was symmetric between the 2 cerebral  hemispheres.  2.  Otherwise the cortical wave forms were symmetric without any focal  abnormalities.  3.  Hyperventilation did not result in any abnormal wave forms.  4.  Photic stimulation did not result in photoconvulsive electrographic  activity.  5.  There was neither ictal nor intraictal epileptiform activity seen during  this recording.     TECHNICAL IMPRESSION OF THE EEG:  Overall this can be considered a normal  EEG.      CLINICAL IMPRESSION:  Again this is a normal EEG and clinical correlation is  recommended.                                            Ricka BurdockPAUL GERARD Lyah Millirons, MD     [id number]  DD: 09/17/2013 17:23   DT: 09/17/2013 19:11   Job #: [id number]  CC:

## 2013-09-17 NOTE — Progress Notes (Signed)
Department of Neurological Sciences  Section of General Neurology - Adult  Attending Progress Note      SUBJECTIVE:  He has no new complaints today.      OBJECTIVE:      PHYSICAL EXAM:    VITALS:  BP 124/68   Pulse 87   Temp(Src) 99 F (37.2 C) (Oral)   Resp 16   Ht 6\' 4"  (1.93 m)   Wt 167 lb 5.3 oz (75.9 kg)   BMI 20.38 kg/m2     SpO2 98%     He is lethargic this AM, not very cooperative, moving all limbs spontaneously.    DATA    LABS:  General Labs:    CBC:   Lab Results   Component Value Date    WBC 3.7 09/17/2013    RBC 4.28 09/17/2013    HGB 13.4 09/17/2013    HCT 39.7 09/17/2013    MCV 92.7 09/17/2013    MCH 31.4 09/17/2013    MCHC 33.8 09/17/2013    RDW 12.9 09/17/2013    PLT 159 09/17/2013    MPV 7.6 09/17/2013     CMP:    Lab Results   Component Value Date    NA 139 09/17/2013    K 3.9 09/17/2013    CL 102 09/17/2013    CO2 23 09/17/2013    BUN 7 09/17/2013    CREATININE 0.8 09/17/2013    CREATININE 1.0 08/17/2011    GFRAA >60 09/17/2013    GFRAA 157 08/19/2011    AGRATIO 1.3 09/15/2013    LABGLOM >60 09/17/2013    GLUCOSE 66 09/17/2013    PROT 7.5 09/15/2013    LABALBU 4.2 09/15/2013    CALCIUM 8.4 09/17/2013    BILITOT 0.3 09/15/2013    ALKPHOS 119 09/15/2013    AST 27 09/15/2013    ALT 10 09/15/2013       ASSESSMENT AND PLAN  His presentation is not totally clear; after seeing his spell yesterday and then learning that he is/has been a heroin abuser, I'm not entirely convinced that epileptic seizures is the primary problem.      Recs:  1.  Continue Dilantin for now and check level in AM - see orders    2. I'll ask for EEG today.        Ladona HornsPaul Grissel Tyrell, MD  Neurohospitalist

## 2013-09-17 NOTE — Plan of Care (Signed)
Problem: Falls - Risk of  Intervention: Fall risk assessment  Patient has been assessed for fall risk, fall precautions are in place, environment has been cleared of obstacles and reassessed during rounding, bed is in lowest position with the wheels locked, call light is within reach. Fall ID band has been verified, appropriate transfer methods have been utilized and patient has been educated on safety         Goal: Absence of falls  Outcome: Met This Shift  Pt free of falls during this shift. Used the call light when getting out of the bed or chair for assistance x1 . Pt wearing proper non-skid socks at all times .Bed is placed in the lowest position, call light within reach and bed/chair alarm is on.

## 2013-09-17 NOTE — Progress Notes (Signed)
Iv started in Left antecubital 20 gauge angiocath  1 attempt brisk blood return flushes easily.

## 2013-09-18 LAB — BASIC METABOLIC PANEL
Anion Gap: 13 (ref 3–16)
BUN: 8 mg/dL (ref 7–20)
CO2: 24 mmol/L (ref 21–32)
Calcium: 8.5 mg/dL (ref 8.3–10.6)
Chloride: 97 mmol/L — ABNORMAL LOW (ref 99–110)
Creatinine: 0.6 mg/dL — ABNORMAL LOW (ref 0.9–1.3)
GFR African American: >60 (ref >60)
GFR Non-African American: >60 (ref >60)
Glucose: 91 mg/dL (ref 70–99)
Potassium: 3.8 mmol/L (ref 3.5–5.1)
Sodium: 134 mmol/L — ABNORMAL LOW (ref 136–145)

## 2013-09-18 LAB — CBC WITH AUTO DIFFERENTIAL
Basophils %: 0.7 %
Basophils Absolute: 0 10*3/uL (ref 0.0–0.2)
Eosinophils %: 8.3 %
Eosinophils Absolute: 0.3 10*3/uL (ref 0.0–0.6)
Hematocrit: 37.2 % — ABNORMAL LOW (ref 40.5–52.5)
Hemoglobin: 12.9 g/dL — ABNORMAL LOW (ref 13.5–17.5)
Lymphocytes %: 45.1 %
Lymphocytes Absolute: 1.6 10*3/uL (ref 1.0–5.1)
MCH: 31.6 pg (ref 26.0–34.0)
MCHC: 34.8 g/dL (ref 31.0–36.0)
MCV: 91 fL (ref 80.0–100.0)
MPV: 7.9 fL (ref 5.0–10.5)
Monocytes %: 11.8 %
Monocytes Absolute: 0.4 10*3/uL (ref 0.0–1.3)
Neutrophils %: 34.1 %
Neutrophils Absolute: 1.2 10*3/uL — ABNORMAL LOW (ref 1.7–7.7)
Platelets: 187 10*3/uL (ref 135–450)
RBC: 4.08 M/uL — ABNORMAL LOW (ref 4.20–5.90)
RDW: 13.2 % (ref 12.4–15.4)
WBC: 3.5 10*3/uL — ABNORMAL LOW (ref 4.0–11.0)

## 2013-09-18 LAB — PHENYTOIN LEVEL, TOTAL: Phenytoin Lvl: 18.2 ug/mL (ref 10.0–20.0)

## 2013-09-18 MED ADMIN — pantoprazole (PROTONIX) tablet 40 mg: 40 mg | ORAL | @ 11:00:00 | NDC 51079005101

## 2013-09-18 MED FILL — PHENYTOIN SODIUM EXTENDED 100 MG PO CAPS: 100 MG | ORAL | Qty: 2

## 2013-09-18 MED FILL — PANTOPRAZOLE SODIUM 40 MG PO TBEC: 40 MG | ORAL | Qty: 1

## 2013-09-18 MED FILL — LOVENOX 40 MG/0.4ML SC SOLN: 40 MG/0.4ML | SUBCUTANEOUS | Qty: 0.4
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
## 2013-09-18 NOTE — Discharge Instructions (Signed)
General diet

## 2013-09-18 NOTE — Progress Notes (Signed)
Spoke with Dr Rosalia HammersBrian Masterson @ [phone]. He stated the patient was okay from his stand-point. No hallucinations. Will contact Dr Lavera GuiseBacanurschi with Dr Wyatt PortelaMasterson's findings. Electronically signed by Valora PiccoloEmily R Leigh, RN on 09/18/2013 at 11:30 AM

## 2013-09-18 NOTE — Progress Notes (Signed)
Patient A&O, discharged home with documented belongings with mother, refused wheelchair transport, was ambulatory, no weakness or lightheaded dizziness. Explained discharge, follow up, and medication instructions with patient and patient verbalized understanding. IV was removed with no complications.  Electronically signed by Valora PiccoloEmily R Leigh, RN on 09/18/2013 at 1:26 PM

## 2013-09-18 NOTE — Plan of Care (Signed)
Problem: Falls - Risk of  Goal: Absence of falls  Outcome: Ongoing  Fall risk assessment completed. Fall precautions in place. Bed in lowest position, wheels locked, bed/chair exit alarm in place, call light within reach, and non skid footwear on. Walkway free of clutter. Pt alert and oriented and able to make needs known. Pt educated to use call light when needing to get up, and pt utilizes call light to make needs known. Will continue to monitor.   Electronically signed by Valora PiccoloEmily R Leigh, RN on 09/18/2013 at 10:46 AM

## 2013-09-18 NOTE — Discharge Instructions (Signed)
phenytoin (oral)  Pronunciation: FEN i toyn  Brand: Dilantin, Dilantin Infatabs, Dilantin-125, Phenytek  What is phenytoin?  Phenytoin is an anti-epileptic drug, also called an anticonvulsant. It works by slowing down impulses in the brain that cause seizures.  Phenytoin is used to control seizures. Phenytoin does not treat all types of seizures, and your doctor will determine if it is the right medication for you.  Phenytoin may also be used for purposes not listed in this medication guide.  What should I discuss with my healthcare provider before taking phenytoin?  You should not use phenytoin if you also take delavirdine (Rescriptor), or if you are allergic to phenytoin, ethotoin (Peganone), fosphenytoin (Cerebyx), or mephenytoin (Mesantoin).  To make sure you can safely take phenytoin, tell your doctor if you have any of these other conditions:   liver disease;   lupus;   diabetes;   a vitamin D deficiency or any other condition that causes thinning of the bones;   porphyria (a genetic enzyme disorder that causes symptoms affecting the skin or nervous system); or   if you drink large amounts of alcohol.  Your family or other caregivers should also be alert to changes in your mood or symptoms. Your doctor will need to check you at regular visits. Do not miss any scheduled appointments.  You may have thoughts about suicide while taking this medication. Tell your doctor if you have new or worsening depression or suicidal thoughts during the first several months of treatment, or whenever your dose is changed.  Patients of Asian ancestry may have a higher risk of developing a rare but serious skin reaction to phenytoin. Your doctor may recommend a blood test before you start the medication to determine your risk of this skin reaction.  FDA pregnancy category D. Phenytoin may cause harm to an unborn baby, but having a seizure during pregnancy could harm both the mother and the baby.  If you are  pregnant, DO NOT START TAKING phenytoin unless your doctor tells you to. If you become pregnant while taking phenytoin, DO NOT STOP TAKING the medicine without your doctor's advice.   Seizure control is very important during pregnancy. The benefit of preventing seizures may outweigh any risks posed by taking phenytoin. Follow your doctor's instructions about taking phenytoin while you are pregnant.   Phenytoin can make birth control pills less effective. To prevent pregnancy while you are taking phenytoin, use a non-hormonal form of birth control (such as a condom or diaphragm with spermicide).  Phenytoin can pass into breast milk and may harm a nursing baby. You should not breast-feed while you are using phenytoin.  How should I take phenytoin?  Take exactly as prescribed by your doctor. Do not take in larger or smaller amounts or for longer than recommended. Follow the directions on your prescription label.  Do not crush, chew, break, or open an extended-release capsule. Swallow it whole. Breaking or opening the pill may cause too much of the drug to be released at one time. Do not use any phenytoin capsule that has changed colors. Call your doctor for a new prescription.  Shake the oral suspension (liquid) well just before you measure a dose. Measure the liquid with a special dose-measuring spoon or medicine cup, not with a regular table spoon. If you do not have a dose-measuring device, ask your pharmacist for one.  To be sure this medication is helping your condition, your blood may need to be tested often. You may also need a  blood test when switching from one form of phenytoin to another. Visit your doctor regularly.  If you are taking phenytoin to treat seizures, keep taking the medication even if you feel fine. You may have an increase in seizures if you stop taking phenytoin. Do not change your dose of phenytoin without your doctor's advice.  Tell your doctor if the medication does not seem to work as well  in treating your condition.  Wear a medical alert tag or carry an ID card stating that you take phenytoin. Any medical care provider who treats you should know that you are taking a seizure medication.  Store at room temperature away from moisture, light, and heat.  What happens if I miss a dose?  Take the missed dose as soon as you remember. Skip the missed dose if it is almost time for your next scheduled dose. Do not  take extra medicine to make up the missed dose.  What happens if I overdose?  Seek emergency medical attention or call the Poison Help line at [phone]. An overdose of phenytoin can be fatal. Overdose symptoms may include twitching eye movements, slurred speech, loss of balance, tremor, muscle stiffness or weakness, nausea, vomiting, feeling light-headed, fainting, and slow or shallow breathing.  What should I avoid while taking phenytoin?  Avoid drinking alcohol while you are taking phenytoin. Alcohol use can increase your blood levels of phenytoin and may increase side effects. Daily alcohol use can decrease your blood levels of phenytoin, which can increase your risk of seizures.  Avoid taking antacids at the same time you take phenytoin. Antacids can make it harder for your body to absorb the medication.  Phenytoin may impair your thinking or reactions. Be careful if you drive or do anything that requires you to be alert.  What are the possible side effects of phenytoin?  Get emergency medical help if you have any of these signs of an allergic reaction:  hives; difficulty breathing; swelling of your face, lips, tongue, or throat. You may be more likely to have an allergic reaction if you are African-American.  Report any new or worsening symptoms to your doctor, such as: mood or behavior changes, depression, anxiety, or if you feel agitated, hostile, restless, hyperactive (mentally or physically), or have thoughts about suicide or hurting yourself.  Call your doctor at once if you have a  serious side effect such as:   fever, swollen glands, body aches, flu symptoms;   skin rash, easy bruising or bleeding, severe tingling, numbness, pain, muscle weakness;   upper stomach pain, loss of appetite, dark urine, jaundice (yellowing of the skin or eyes);   chest pain, irregular heart rhythm, feeling short of breath;   confusion, nausea and vomiting, swelling, rapid weight gain, urinating less than usual or not at all;   new or worsening cough with fever, trouble breathing;   tremor (uncontrolled shaking), restless muscle movements in your eyes, tongue, jaw, or neck;   patchy skin color, red spots, or a butterfly shaped skin rash over your cheeks and nose (worsens in sunlight); or   severe skin reaction -- fever, sore throat, swelling in your face or tongue, burning in your eyes, skin pain, followed by a red or purple skin rash that spreads (especially in the face or upper body) and causes blistering and peeling.  Less serious side effects may include:   slurred speech, loss of balance or coordination;   swollen or tender gums; or   headache, dizziness, nervousness, or  sleep problems (insomnia).  This is not a complete list of side effects and others may occur. Call your doctor for medical advice about side effects. You may report side effects to FDA at 1-800-FDA-1088.  What other drugs will affect phenytoin?  Many drugs can interact with phenytoin. Below is just a partial list. Tell your doctor if you are using:   antibiotics such as cycloserine (Seromycin), doxycycline (Doryx, Vibramycin, Adoxa), isoniazid (for treating tuberculosis), linezolid (Zyvox), rifampin (Rimactane, Rifadin, Rifamate), or sulfa drugs (Bactrim, Septra, Sulfatrim, SMX-TMP, and others);   an antidepressant (such as Elavil, Vanatrip, Limbitrol, Sinequan, Silenor, Pamelor, Paxil, Zoloft, Desyrel, and others);   aspirin or other salicylates;   birth control pills or hormone replacement therapy;   a blood thinner such as  warfarin (Coumadin, Jantoven);   certain sedatives (Librium, Librax, Limbitrol, Valium) or antidepressants (Desyrel, Luvox, Zoloft, Prozac, Rapiflux, Sarafem, Selfemra, Symbyax);   heart medication such as amiodarone (Cordarone, Pacerone), digoxin (digitalis, Lanoxin), furosemide (Lasix), or quinidine (Quin-G);   prochlorperazine (Compazine, Compro), promethazine (Pentazine, Phenergan, Anergan, Antinaus), and other phenothiazines;   steroid medicines (prednisone and others);   seizure medicine (such as Carbatrol, Equetro, Tegretol, Solfoton, Depakene, or Depakote);   stomach acid reducers (such Tagamet, Prilosec, Zegerid, Zantac, Pepcid, or Axid); or   theophylline (Elixophyllin, Theo-Dur, Theo-Bid, Theolair, Uniphyl).  This list is not complete and there are many other medicines that can interact with phenytoin. Tell your doctor about all medications you use. This includes prescription, over-the-counter, vitamin, and herbal products. Do not start a new medication without telling your doctor. Keep a list of all your medicines and show it to any healthcare provider who treats you.  Where can I get more information?  Your pharmacist can provide more information about phenytoin.    Remember, keep this and all other medicines out of the reach of children, never share your medicines with others, and use this medication only for the indication prescribed.   Every effort has been made to ensure that the information provided by [employer]') is accurate, up-to-date, and complete, but no guarantee is made to that effect. Drug information contained herein may be time sensitive. [employer] information has been compiled for use by healthcare practitioners and consumers in the [place] and therefore [employer] does not warrant that uses outside of the [place] are appropriate, unless specifically indicated otherwise. [employer]'s drug information does not endorse drugs, diagnose patients or recommend  therapy. [employer]'s drug information is an [employer] corporate to assist licensed healthcare practitioners in caring for their patients and/or to serve consumers viewing this service as a supplement to, and not a substitute for, the expertise, skill, knowledge and judgment of healthcare practitioners. The absence of a warning for a given drug or drug combination in no way should be construed to indicate that the drug or drug combination is safe, effective or appropriate for any given patient. [employer] does not assume any responsibility for any aspect of healthcare administered with the aid of information [employer] provides. The information contained herein is not intended to cover all possible uses, directions, precautions, warnings, drug interactions, allergic reactions, or adverse effects. If you have questions about the drugs you are taking, check with your doctor, nurse or pharmacist.  Copyright [phone])[phone] [employer] Version: 12.01. Revision date: 07/07/2010.  This information does not replace the advice of a doctor. [employer] Incorporated disclaims any warranty or liability for your use of this information.   Content Version: 10.4.390249  Recurring Migraine Headache: After Your Visit  Your Care Instructions  Migraines are painful, throbbing headaches. They often start on one side of the head. They may cause nausea and vomiting and make you sensitive to light, sound, or smell. Some people may have only a few migraines throughout life. Others have them as often as several times a month.  The goal of treatment is to reduce the number of migraines you have and relieve your symptoms. Even with treatment, you may continue to have migraines. You play an important role in dealing with your headaches. Work on avoiding things that seem to trigger your migraines. When you feel a headache coming on, act quickly to stop it before it gets worse.  Follow-up care is a key part of your treatment and  safety. Be sure to make and go to all appointments, and call your doctor if you are having problems. It's also a good idea to know your test results and keep a list of the medicines you take.  How can you care for yourself at home?   Do not drive if you have taken a prescription pain medicine.   Rest in a quiet, dark room until your headache is gone. Close your eyes and try to relax or go to sleep. Do not watch TV or read.   Put a cold, moist cloth or cold pack on the painful area for 10 to 20 minutes at a time. Put a thin cloth between the cold pack and your skin.   Have someone gently massage your neck and shoulders.   Take your medicines exactly as prescribed. Call your doctor if you think you are having a problem with your medicine. You will get more details on the specific medicines your doctor prescribes.  To prevent migraines   Keep a headache diary so you can figure out what triggers your headaches. Avoiding triggers may help you prevent headaches. Record when each headache began, how long it lasted, and what the pain was like. Use words like throbbing, aching, stabbing, or dull. Write down any other symptoms you had with the headache. These may include nausea, flashing lights or dark spots, or sensitivity to bright light or loud noise. Note if the headache occurred near your period. List anything that might have triggered the headache. Triggers may include certain foods (chocolate, cheese, wine) or odors, smoke, bright light, stress, or lack of sleep.   If your doctor has prescribed medicine for your migraines, take it as directed. You may have medicine that you take only when you get a migraine and medicine that you take all the time to help prevent migraines.   If your doctor has prescribed medicine for when you get a headache, take it at the first sign of a migraine, unless your doctor has given you other instructions.   If your doctor has prescribed medicine to prevent migraines, take it  exactly as prescribed. Call your doctor if you think you are having a problem with your medicine.   Find healthy ways to deal with stress. Migraines are most common during or right after stressful times. Take time to relax before and after you do something that has caused a migraine in the past.   Try to keep your muscles relaxed by keeping good posture. Check your jaw, face, neck, and shoulder muscles for tension. Try to relax them. When sitting at a desk, change positions often. Stretch for 30 seconds each hour.   Get regular sleep and exercise.  Eat regular meals, and avoid foods and drinks that often trigger migraines. These include chocolate and alcohol, especially red wine and port. Chemicals used in food, such as aspartame and monosodium glutamate (MSG), also can trigger migraines. So can some food additives, such as those found in hot dogs, bacon, cold cuts, aged cheeses, and pickled foods.   Limit caffeine by not drinking too much coffee, tea, or soda. Do not quit caffeine suddenly, because that can also give you migraines.   Do not smoke or allow others to smoke around you. If you need help quitting, talk to your doctor about stop-smoking programs and medicines. These can increase your chances of quitting for good.   If you are taking birth control pills or hormone therapy, talk to your doctor about whether they are triggering your migraines.  When should you call for help?  Call 911 anytime you think you may need emergency care. For example, call if:   You have signs of a stroke. These may include:   Sudden numbness, paralysis, or weakness in your face, arm, or leg, especially on only one side of your body.   New problems with walking or balance.   Sudden vision changes.   Drooling or slurred speech.   New problems speaking or understanding simple statements, or feeling confused.   A sudden, severe headache that is different from past headaches.  Call your doctor now or seek immediate  medical care if:   You develop a fever and a stiff neck.   You have new nausea and vomiting, or you cannot keep down food or liquids.  Watch closely for changes in your health, and be sure to contact your doctor if:   You have a headache that does not get better within 1 or 2 days.   Your headaches get worse or happen more often.   Where can you learn more?   Go to [email or web address] and sign in to your MyChart account. Enter V975 in the Search Health Information box to learn more about "Recurring Migraine Headache: After Your Visit."    If you do not have an account, please click on the "Sign Up Now" link.      2006-2015 Healthwise, Inc[employer]. Care instructions adapted under license by [facility]. This care instruction is for use with your licensed healthcare professional. If you have questions about a medical condition or this instruction, always ask your healthcare professional. Healthwise, Incorporated disclaims any warranty or liability for your use of this information.  Content Version: 10.4.390249; Current as of: August 02, 2012                  Seizure: After Your Visit  Your Care Instructions     Seizures are caused by abnormal patterns of electrical signals in the brain. They are different for each person.  Seizures can affect movement, speech, vision, or awareness. Some people have only slight shaking of a hand and do not pass out. Other people may pass out and have violent shaking of the whole body. Some people appear to stare into space. They are awake, but they can't respond normally. Later, they may not remember what happened.  You may need tests to identify the type and cause of the seizures.  A seizure may occur only once, or you may have them more than one time. Taking medicines as directed and following up with your doctor may help keep you from having more seizures.  The doctor has checked you carefully, but problems  can develop later. If you notice any problems or  new symptoms, get medical treatment right away.  Follow-up care is a key part of your treatment and safety. Be sure to make and go to all appointments, and call your doctor if you are having problems. It's also a good idea to know your test results and keep a list of the medicines you take.  How can you care for yourself at home?   Be safe with medicines. Take your medicines exactly as prescribed. Call your doctor if you think you are having a problem with your medicine.   Do not do any activity that could be dangerous to you or others until your doctor says it is safe to do so. For example, do not drive a car, operate machinery, swim, or climb ladders.   Be sure that anyone treating you for any health problem knows that you have had a seizure and what medicines you are taking for it.   Identify and avoid things that may make you more likely to have a seizure. These may include lack of sleep, alcohol or drug use, stress, or not eating.   Make sure you go to your follow-up appointment.  When should you call for help?  Call 911 anytime you think you may need emergency care. For example, call if:   You have another seizure.   You have more than one seizure in 24 hours.   You have new symptoms, such as trouble walking, speaking, or thinking clearly.  Call your doctor now or seek immediate medical care if:   You are not acting normally.  Watch closely for changes in your health, and be sure to contact your doctor if you have any problems.   Where can you learn more?   Go to [email or web address] and sign in to your MyChart account. Enter [id number] in the Search Health Information box to learn more about "Seizure: After Your Visit."    If you do not have an account, please click on the "Sign Up Now" link.      2006-2015 Healthwise, Incorporated. Care instructions adapted under license by [facility]. This care instruction is for use with your licensed healthcare professional. If you have questions  about a medical condition or this instruction, always ask your healthcare professional. Healthwise, Incorporated disclaims any warranty or liability for your use of this information.  Content Version: 10.4.390249; Current as of: August 02, 2012                Follow up with PCP in  1-2 weeks.

## 2013-09-18 NOTE — Plan of Care (Signed)
Problem: Falls - Risk of  Goal: Absence of falls  Outcome: Completed Date Met:  09/18/13  Fall risk assessment completed. Fall precautions in place. Bed in lowest position, wheels locked, bed/chair exit alarm in place, call light within reach, and non skid footwear on. Walkway free of clutter. Pt alert and oriented and able to make needs known. Pt educated to use call light when needing to get up, and pt utilizes call light to make needs known. Will continue to monitor.   Electronically signed by Ollen Gross, RN on 09/18/2013 at 12:18 PM

## 2013-09-18 NOTE — Discharge Instructions (Signed)
Activity as tolerated

## 2013-09-18 NOTE — Progress Notes (Signed)
Patient A&O, up x1 with standby ast. No hallucinations this shift so far. Patient anxious to leave. Awaiting Psychiatry to see patient. Notes from yesterday looked indicated psychiatry was already call. Consult was called today for psychiatry due to notes from MD. Will cont to monitor and reassess.  Electronically signed by Valora PiccoloEmily R Leigh, RN on 09/18/2013 at 10:49 AM
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
## 2013-11-07 MED ORDER — PHENYTOIN SODIUM EXTENDED 100 MG PO CAPS
100 MG | ORAL_CAPSULE | ORAL | Status: DC
Start: 2013-11-07 — End: 2014-02-15

## 2013-11-07 NOTE — Patient Instructions (Signed)
Please call with any questions or concerns:   [facility]  @ [phone].    LAB RESULTS:  Please obtain any labs or diagnostic tests as discussed today.   You may call the office to check the results.  Please allow  3 to 7 days for us to get these results.    MEDICATION LIST:  Please bring an accurate list of your medications to every visit.    APPOINTMENT CONFIRMATION:  We will call you the day before your scheduled appointment to confirm.   If we are unable to reach you, you MUST call back by the end of the day to confirm the appointment or we may be forced to cancel.           Stopping Smoking: After Your Visit  Your Care Instructions  Cigarette smokers crave the nicotine in cigarettes. Giving it up is much harder than simply changing a habit. Your body has to stop craving the nicotine. It is hard to quit, but you can do it. There are many tools that people use to quit smoking. You may find that combining tools works best for you.  There are several steps to quitting. First you get ready to quit. Then you get support to help you. After that, you learn new skills and behaviors to become a nonsmoker. For many people, a necessary step is getting and using medicine.  Your doctor will help you set up the plan that best meets your needs. You may want to attend a smoking cessation program to help you quit smoking. When you choose a program, look for one that has proven success. Ask your doctor for ideas. You will greatly increase your chances of success if you take medicine as well as get counseling or join a cessation program.  Some of the changes you feel when you first quit tobacco are uncomfortable. Your body will miss the nicotine at first, and you may feel short-tempered and grumpy. You may have trouble sleeping or concentrating. Medicine can help you deal with these symptoms. You may struggle with changing your smoking habits and rituals. The last step is the tricky one: Be  prepared for the smoking urge to continue for a time. This is a lot to deal with, but keep at it. You will feel better.  Follow-up care is a key part of your treatment and safety. Be sure to make and go to all appointments, and call your doctor if you are having problems. It???s also a good idea to know your test results and keep a list of the medicines you take.  How can you care for yourself at home?  ?? Ask your family, friends, and coworkers for support. You have a better chance of quitting if you have help and support.  ?? Join a support group, such as Nicotine Anonymous, for people who are trying to quit smoking.  ?? Consider signing up for a smoking cessation program, such as the American Lung Association's Freedom from Smoking program.  ?? Set a quit date. Pick your date carefully so that it is not right in the middle of a big deadline or stressful time. Once you quit, do not even take a puff. Get rid of all ashtrays and lighters after your last cigarette. Clean your house and your clothes so that they do not smell of smoke.  ?? Learn how to be a nonsmoker. Think about ways you can avoid those things that make you reach for a cigarette.  ??   Avoid situations that put you at greatest risk for smoking. For some people, it is hard to have a drink with friends without smoking. For others, they might skip a coffee break with coworkers who smoke.  ?? Change your daily routine. Take a different route to work or eat a meal in a different place.  ?? Cut down on stress. Calm yourself or release tension by doing an activity you enjoy, such as reading a book, taking a hot bath, or gardening.  ?? Talk to your doctor or pharmacist about nicotine replacement therapy, which replaces the nicotine in your body. You still get nicotine but you do not use tobacco. Nicotine replacement products help you slowly reduce the amount of nicotine you need. These products come in several forms, many of them available over-the-counter:  ?? Nicotine  patches  ?? Nicotine gum and lozenges  ?? Nicotine inhaler  ?? Ask your doctor about bupropion (Wellbutrin) or varenicline (Chantix), which are prescription medicines. They do not contain nicotine. They help you by reducing withdrawal symptoms, such as stress and anxiety.  ?? Some people find hypnosis, acupuncture, and massage helpful for ending the smoking habit.  ?? Eat a healthy diet and get regular exercise. Having healthy habits will help your body move past its craving for nicotine.  ?? Be prepared to keep trying. Most people are not successful the first few times they try to quit. Do not get mad at yourself if you smoke again. Make a list of things you learned and think about when you want to try again, such as next week, next month, or next year.   Where can you learn more?   Go to [email or web address] and sign in to your MyChart account. Enter Y522 in the Search Health Information box to learn more about ???Stopping Smoking: After Your Visit.???    If you do not have an account, please click on the ???Sign Up Now??? link.     ?? 2006-2015 Healthwise, Incorporated. Care instructions adapted under license by [facility]. This care instruction is for use with your licensed healthcare professional. If you have questions about a medical condition or this instruction, always ask your healthcare professional. Healthwise, Incorporated disclaims any warranty or liability for your use of this information.  Content Version: 10.4.390249; Current as of: January 30, 2013

## 2013-11-07 NOTE — Progress Notes (Signed)
Roy GreeningErnest J Weaver   Neurology followup    Subjective:   CC/HP  History was obtained from the patient.  Patient has known partial complex seizures  The seizures started 2013.  [facility], [facility] in the past.  Seizures are well controlled as long as he takes his medications regularly  Patient has not had any seizures since the last visit      REVIEW OF SYSTEMS    Constitutional:  []    Chills   []   Fatigue   []   Fevers   []   Malaise   [x]   Weight loss     []  Denies all of the above    Respiratory:   []   Cough    []   Shortness of breath         [x]  Denies all of the above     Cardiovascular:   []   Chest pain    []   Exertional chest pressure/discomfort           []  Palpitations    []   Syncope     [x]  Denies all of the above        Past Medical History   Diagnosis Date   . Seizures (HCC)    . Migraine      Family History   Problem Relation Age of Onset   . Diabetes Father      History     Social History   . Marital Status: Single     Spouse Name: N/A     Number of Children: N/A   . Years of Education: N/A     Social History Main Topics   . Smoking status: Current Some Day Smoker -- 0.50 packs/day for 2 years     Types: Cigarettes   . Smokeless tobacco: Never Used   . Alcohol Use: No      Comment: stopped drinking   . Drug Use: No      Comment: stopped smoking marijuana    . Sexual Activity: None     Other Topics Concern   . None     Social History Narrative    ** Merged History Encounter **             Objective:  Exam:  BP 124/71   Pulse 71   Ht 6\' 4"  (1.93 m)   Wt 168 lb (76.204 kg)   BMI 20.46 kg/m2     This is a well-nourished patient in no acute distress  Patient is awake, alert and oriented x3. Speech is normal.  Pupils are equal round reacting to light. Extraocular movements intact. Face symmetrical. Tongue midline.  Motor exam shows normal symmetrical strength. Deep tendon reflexes normal. Plantar reflexes downgoing.  Sensory exam normal.  Coordination normal. Gait normal. No carotid bruit. No neck stiffness.        Data :  LABS:  General Labs:    CBC:   Lab Results   Component Value Date    WBC 3.5 09/18/2013    RBC 4.08 09/18/2013    HGB 12.9 09/18/2013    HCT 37.2 09/18/2013    MCV 91.0 09/18/2013    MCH 31.6 09/18/2013    MCHC 34.8 09/18/2013    RDW 13.2 09/18/2013    PLT 187 09/18/2013    MPV 7.9 09/18/2013     BMP:    Lab Results   Component Value Date    NA 134 09/18/2013  K 3.8 09/18/2013    CL 97 09/18/2013    CO2 24 09/18/2013    BUN 8 09/18/2013    LABALBU 4.2 09/15/2013    CREATININE 0.6 09/18/2013    CREATININE 1.0 08/17/2011    CALCIUM 8.5 09/18/2013    GFRAA >60 09/18/2013    GFRAA 157 08/19/2011    LABGLOM >60 09/18/2013    GLUCOSE 91 09/18/2013     Dilantin level was therapeutic at 18.2  Impression :  Partial complex seizures, well-controlled    Plan :  Continue Dilantin 200 mg in the morning and 400 mg at night  We'll change him to the generic Dilantin as per request from insurance company  I will recheck Dilantin level in 3 months        Please note a portion of  this chart was generated using dragon dictation software. Although every effort was made to ensure the accuracy of this automated transcription, some errors in transcription may have occurred.

## 2013-11-16 NOTE — ED Notes (Signed)
Bed: B-04  Expected date: 11/16/13  Expected time:   Means of arrival:   Comments:  28 yo M - seizure  Post-ictal   Last seizure was 3 months ago

## 2013-11-16 NOTE — ED Notes (Signed)
Witnessed seizure. Pt arrives post ictal. Pt responded to glucose finger stick    Evelene Croon, RN  11/16/13 2159

## 2013-11-16 NOTE — ED Notes (Signed)
Pt awakens now and is alert and oriented. Pt moves all exts. Pt had nausea and vomiting of clear emesis. Zofran 4 mg IV given for N/V with relief of the nausea. IV site in right hand with blood sent to lab. Pt receiving NS bolus. Pt resting quietly with eyes closed. Seizure precautions in place.    Marcellina MillinJoanne E Jahniyah Revere, RN  11/16/13 [phone]

## 2013-11-17 ENCOUNTER — Inpatient Hospital Stay: Admit: 2013-11-17 | Discharge: 2013-11-17 | Attending: Internal Medicine

## 2013-11-17 LAB — POCT GLUCOSE: POC Glucose: 132 mg/dl — ABNORMAL HIGH (ref 70–99)

## 2013-11-17 LAB — PHENYTOIN LEVEL, TOTAL: Phenytoin Lvl: 1.3 ug/mL — ABNORMAL LOW (ref 10.0–20.0)

## 2013-11-17 MED ADMIN — ondansetron (ZOFRAN) injection 4 mg: 4 mg | INTRAVENOUS | @ 03:00:00 | NDC 00409475503

## 2013-11-17 MED ADMIN — 0.9 % sodium chloride bolus: 1000 mL | INTRAVENOUS | @ 03:00:00 | NDC 00338004904

## 2013-11-17 MED ADMIN — phenytoin (DILANTIN) ER capsule 300 mg: 300 mg | ORAL | @ 05:00:00 | NDC 68084037611

## 2013-11-17 MED FILL — SODIUM CHLORIDE 0.9 % IV SOLN: 0.9 % | INTRAVENOUS | Qty: 1000

## 2013-11-17 MED FILL — PHENYTOIN SODIUM EXTENDED 100 MG PO CAPS: 100 MG | ORAL | Qty: 3

## 2013-11-17 MED FILL — ONDANSETRON HCL 4 MG/2ML IJ SOLN: 4 MG/2ML | INTRAMUSCULAR | Qty: 2

## 2013-11-17 NOTE — ED Notes (Signed)
Pt IV discontinued. Pt discharged after discharge instructions reviewed with patient. Pt assisted up into wheelchair and discharged with mother. Pt complained of headache upon discharge but stated did not want any medication. He stated that marijuana is what helps his headache.     Lenell Antu, RN  11/17/13 [phone]

## 2013-11-17 NOTE — Discharge Instructions (Signed)
Seizure: After Your Visit  Your Care Instructions     Seizures are caused by abnormal patterns of electrical signals in the brain. They are different for each person.  Seizures can affect movement, speech, vision, or awareness. Some people have only slight shaking of a hand and do not pass out. Other people may pass out and have violent shaking of the whole body. Some people appear to stare into space. They are awake, but they can't respond normally. Later, they may not remember what happened.  You may need tests to identify the type and cause of the seizures.  A seizure may occur only once, or you may have them more than one time. Taking medicines as directed and following up with your doctor may help keep you from having more seizures.  The doctor has checked you carefully, but problems can develop later. If you notice any problems or new symptoms, get medical treatment right away.  Follow-up care is a key part of your treatment and safety. Be sure to make and go to all appointments, and call your doctor if you are having problems. It's also a good idea to know your test results and keep a list of the medicines you take.  How can you care for yourself at home?   Be safe with medicines. Take your medicines exactly as prescribed. Call your doctor if you think you are having a problem with your medicine.   Do not do any activity that could be dangerous to you or others until your doctor says it is safe to do so. For example, do not drive a car, operate machinery, swim, or climb ladders.   Be sure that anyone treating you for any health problem knows that you have had a seizure and what medicines you are taking for it.   Identify and avoid things that may make you more likely to have a seizure. These may include lack of sleep, alcohol or drug use, stress, or not eating.   Make sure you go to your follow-up appointment.  When should you call for help?  Call 911 anytime you think you may need emergency care. For  example, call if:   You have another seizure.   You have more than one seizure in 24 hours.   You have new symptoms, such as trouble walking, speaking, or thinking clearly.  Call your doctor now or seek immediate medical care if:   You are not acting normally.  Watch closely for changes in your health, and be sure to contact your doctor if you have any problems.   Where can you learn more?   Go to [email or web address] and sign in to your MyChart account. Enter [phone] in the [place] box to learn more about "Seizure: After Your Visit."    If you do not have an account, please click on the "Sign Up Now" link.      2006-2015 Healthwise, Incorporated. Care instructions adapted under license by [facility]. This care instruction is for use with your licensed healthcare professional. If you have questions about a medical condition or this instruction, always ask your healthcare professional. [facility] any warranty or liability for your use of this information.  Content Version: 10.4.390249; Current as of: August 02, 2012        Today are likely related to a seizure and thankfully there is not been any recurrence of seizure activity. Please continue to be compliant with her medication and follow-up with your primary care  doctor and neurologist within 24-48 hours. If there is any change in her symptoms please return immediately to the emergency room.

## 2013-11-18 NOTE — ED Provider Notes (Signed)
[facility] EMERGENCY DEPT  eMERGENCY dEPARTMENT eNCOUnter      Pt Name: Roy Weaver  MRN: [id number]  [place] 1986-03-25  Date of evaluation: 11/16/2013  Provider: Marcell Barlow, MD    CHIEF COMPLAINT       Chief Complaint   Patient presents with   ??? Seizures         HISTORY OF PRESENT ILLNESS  (Location/Symptom, Timing/Onset, Context/Setting, Quality, Duration, Modifying Factors, Severity.)   Roy Weaver is a 28 y.o. male who presents to the emergency department s/p seizure with post ictal state.  NOt providing information and EMS informs family member called.  No reported head trauma.  Pt is known to have seizure disorder and is frequently seen for seizures.     HPI    Nursing Notes were reviewed and I agree.    REVIEW OF SYSTEMS    (2-9 systems for level 4, 10 or more for level 5)     Review of Systems   Unable to perform ROS: Other     Except as noted above the remainder of the review of systems was reviewed and negative.       PAST MEDICAL HISTORY     Past Medical History   Diagnosis Date   ??? Seizures ([place])    ??? Migraine          SURGICAL HISTORY     History reviewed. No pertinent past surgical history.      CURRENT MEDICATIONS       Discharge Medication List as of 11/17/2013  1:08 AM      CONTINUE these medications which have NOT CHANGED    Details   phenytoin (DILANTIN) 100 MG ER capsule Take 3 capsules by mouth twice a day, Disp-180 capsule, R-2      nicotine (NICODERM CQ) 21 MG/24HR Place 1 patch onto the skin every 24 hours., Disp-30 patch, R-3             ALLERGIES     Review of patient's allergies indicates no known allergies.    FAMILY HISTORY       Family History   Problem Relation Age of Onset   ??? Diabetes Father           SOCIAL HISTORY       History     Social History   ??? Marital Status: Single     Spouse Name: N/A     Number of Children: N/A   ??? Years of Education: N/A     Social History Main Topics   ??? Smoking status: Current Some Day Smoker -- 0.50 packs/day for 2 years     Types:  Cigarettes   ??? Smokeless tobacco: Never Used   ??? Alcohol Use: No      Comment: stopped drinking   ??? Drug Use: No      Comment: stopped smoking marijuana    ??? Sexual Activity: None     Other Topics Concern   ??? None     Social History Narrative    ** Merged History Encounter **              PHYSICAL EXAM    (up to 7 for level 4, 8 or more for level 5)   ED Triage Vitals   BP Temp Temp Source Pulse Resp SpO2 Height Weight   11/16/13 2156 11/16/13 2156 11/16/13 2156 11/16/13 2156 11/16/13 2156 11/16/13 2156 11/16/13 2156 --   160/90 mmHg 98 ??F (36.7 ??C) Oral  80 14 95 % 6' 4"$  (1.93 m)        Physical Exam   Constitutional: He is oriented to person, place, and time. He appears well-developed and well-nourished.   HENT:   Head: Normocephalic and atraumatic.   Right Ear: External ear normal.   Left Ear: External ear normal.   Eyes: Conjunctivae and EOM are normal. Pupils are equal, round, and reactive to light. Right eye exhibits no discharge. Left eye exhibits no discharge.   Neck: Normal range of motion. Neck supple. No JVD present.   Cardiovascular: Normal rate, regular rhythm, normal heart sounds and intact distal pulses.  Exam reveals no gallop and no friction rub.    No murmur heard.  Pulmonary/Chest: Effort normal and breath sounds normal. No respiratory distress. He exhibits no tenderness.   Abdominal: Soft. Bowel sounds are normal. He exhibits no distension and no mass. There is no tenderness. There is no rebound and no guarding.   Musculoskeletal: He exhibits no edema or tenderness.   Neurological: He is alert and oriented to person, place, and time. He has normal reflexes. No cranial nerve deficit. He exhibits normal muscle tone. Coordination normal.   Pt initially post ictal, very confused, not answering questions and when he did answer was nonsensical.  After about 10 minutes he had resolution and normalization of mental status.   Skin: No rash noted. No erythema.   Psychiatric: He has a normal mood and affect.  Judgment normal.   Nursing note and vitals reviewed.        DIFFERENTIAL DIAGNOSIS   Vitals:    Filed Vitals:    11/16/13 2311 11/16/13 2326 11/16/13 2340 11/17/13 0109   BP: 116/62 129/61 128/78 112/57   Pulse: 73 65 82 97   Temp:    98.1 ??F (36.7 ??C)   TempSrc:    Oral   Resp: 12 14 11 24   $ Height:       SpO2:  100%  98%         MDM  Number of Diagnoses or Management Options  Seizure [facility]):   Diagnosis management comments: 28yo m with known seizure disorder, now not postictal.  No reported underlying illness he says.  Dilantin level is subtherapeutic and will give dose; will have follow up with PMD and will also suggest he follow up with his neurologist.  No indication for labs or imaging otherwise.  Pt discharged AOx3 and no repeated seizure activity noted.        DIAGNOSTIC RESULTS     EKG: All EKG's are interpreted by Marcell Barlow, MD in the absence of a cardiologist.        RADIOLOGY:   Non-plain film images such as CT, Ultrasound and MRI are read by the radiologist. Plain radiographic images are visualized and preliminarily interpreted Doretha Sou, MD with the below findings:        Interpretation per the Radiologist below, if available at the time of this note:           ED BEDSIDE ULTRASOUND:   Performed by ED Physician - none    LABS:  Labs Reviewed   PHENYTOIN LEVEL, TOTAL - Abnormal; Notable for the following:     Phenytoin Lvl 1.3 (*)     All other components within normal limits   POCT GLUCOSE - Abnormal; Notable for the following:     POC Glucose 132 (*)     All other components within normal limits  All other labs were within normal range or not returned as of this dictation.    EMERGENCY DEPARTMENT COURSE and DIFFERENTIAL DIAGNOSIS/MDM:   Vitals:    Filed Vitals:    11/16/13 2311 11/16/13 2326 11/16/13 2340 11/17/13 0109   BP: 116/62 129/61 128/78 112/57   Pulse: 73 65 82 97   Temp:    98.1 ??F (36.7 ??C)   TempSrc:    Oral   Resp: 12 14 11 24   $ Height:       SpO2:  100%  98%            CONSULTS:  None    PROCEDURES:  Unless otherwise noted below, none     Procedures      FINAL IMPRESSION      1. Seizure [facility])          DISPOSITION/PLAN   DISPOSITION Decision to Discharge    PATIENT REFERRED TO:  Garnetta Buddy, MD  [place] [id number]    In 2 days        DISCHARGE MEDICATIONS:  Discharge Medication List as of 11/17/2013  1:08 AM          (Please note that portions of this note were completed with a voice recognition program.  Efforts were made to edit the dictations but occasionally words are mis-transcribed.)    Marcell Barlow, MD  Attending Emergency Physician        Marcell Barlow, MD  11/18/13 [phone]

## 2013-11-19 LAB — EKG 12-LEAD
Atrial Rate: 80 {beats}/min
P Axis: 47 degrees
P-R Interval: 190 ms
Q-T Interval: 386 ms
QRS Duration: 90 ms
QTc Calculation (Bazett): 445 ms
R Axis: 82 degrees
T Axis: 38 degrees
Ventricular Rate: 80 {beats}/min

## 2013-12-04 ENCOUNTER — Encounter

## 2013-12-04 LAB — COMPREHENSIVE METABOLIC PANEL
ALT: 13 U/L (ref 10–40)
AST: 16 U/L (ref 15–37)
Albumin/Globulin Ratio: 1.6 (ref 1.1–2.2)
Albumin: 4.2 g/dL (ref 3.4–5.0)
Alkaline Phosphatase: 97 U/L (ref 40–129)
Anion Gap: 13 (ref 3–16)
BUN: 10 mg/dL (ref 7–20)
CO2: 25 mmol/L (ref 21–32)
Calcium: 9.4 mg/dL (ref 8.3–10.6)
Chloride: 104 mmol/L (ref 99–110)
Creatinine: 0.6 mg/dL — ABNORMAL LOW (ref 0.9–1.3)
GFR African American: >60 (ref >60)
GFR Non-African American: >60 (ref >60)
Globulin: 2.6 g/dL
Glucose: 94 mg/dL (ref 70–99)
Potassium: 4.9 mmol/L (ref 3.5–5.1)
Sodium: 142 mmol/L (ref 136–145)
Total Bilirubin: <0.2 mg/dL (ref 0.0–1.0)
Total Protein: 6.8 g/dL (ref 6.4–8.2)

## 2013-12-04 LAB — CBC
Hematocrit: 38.4 % — ABNORMAL LOW (ref 40.5–52.5)
Hemoglobin: 12.7 g/dL — ABNORMAL LOW (ref 13.5–17.5)
MCH: 30.1 pg (ref 26.0–34.0)
MCHC: 33.2 g/dL (ref 31.0–36.0)
MCV: 90.8 fL (ref 80.0–100.0)
MPV: 7.6 fL (ref 5.0–10.5)
Platelets: 288 10*3/uL (ref 135–450)
RBC: 4.23 M/uL (ref 4.20–5.90)
RDW: 13.1 % (ref 12.4–15.4)
WBC: 2.8 10*3/uL — ABNORMAL LOW (ref 4.0–11.0)

## 2013-12-04 LAB — PHENYTOIN LEVEL, TOTAL: Phenytoin Lvl: 5.2 ug/mL — ABNORMAL LOW (ref 10.0–20.0)

## 2013-12-04 NOTE — Progress Notes (Signed)
Subjective:      Patient ID: Roy Weaver is a 28 y.o. male.    HPI  Patient with a history of recurrent seizures and migraines is here after having had a seizure on 6/26 and presenting to the ER.  His dosing of Dilantin was changed by Dr. Glean Hess while he was there and he was discharged home.  He reports that he had a severe migraine afterwards, but used imitrex with resolution.  He has had no further seizures or migraines.  He has stopped smoking.  He denies alcohol or drug use.  He is trying to gain weight, and we have discussed dietary strategies.    Past Medical History   Diagnosis Date   ??? Seizures (HCC)    ??? Migraine        Review of Systems   Constitutional: Negative for fever, chills and fatigue.        All vaccinations complete    HENT: Negative.    Eyes: Negative.         Glasses ; Eye ex 2/14    Respiratory: Negative for cough, chest tightness, shortness of breath and wheezing.         Smokes 1 ppd : stopped in May 2015 ; No alcohol; Marijuana occ :  Stopped in May 2015    No Asthma    Cardiovascular: Negative.         Dad has HTN    Gastrointestinal: Negative.  Negative for diarrhea, constipation and blood in stool.        No FH of ca colon    Endocrine:        Dad has Diabetes    Genitourinary: Negative for dysuria, urgency and frequency.   Musculoskeletal: Negative.    Skin: Negative for rash.   Neurological: Positive for seizures. Negative for dizziness, tremors, weakness, light-headedness and headaches.   Psychiatric/Behavioral: Negative for behavioral problems and sleep disturbance. The patient is not nervous/anxious.        Objective:   Physical Exam   Constitutional: He is oriented to person, place, and time. He appears well-developed and well-nourished. No distress.   HENT:   Head: Normocephalic and atraumatic.   Eyes: Conjunctivae are normal. Right eye exhibits no discharge. Left eye exhibits no discharge. No scleral icterus.   Neck: Neck supple.   Cardiovascular: Normal rate, regular rhythm  and normal heart sounds.    Pulmonary/Chest: Effort normal and breath sounds normal. No respiratory distress. He has no wheezes. He has no rales.   Abdominal: Soft. He exhibits no distension and no mass. There is no tenderness.   Musculoskeletal: Normal range of motion.   Neurological: He is alert and oriented to person, place, and time. No cranial nerve deficit. He exhibits normal muscle tone. Coordination normal.   Skin: Skin is warm and dry. He is not diaphoretic.   Psychiatric: He has a normal mood and affect. His behavior is normal.   Nursing note and vitals reviewed.      Assessment:      1. Seizure disorder (HCC)  CBC    COMPREHENSIVE METABOLIC PANEL    Phenytoin level, total   2. Long term use of drug  CBC    COMPREHENSIVE METABOLIC PANEL    Phenytoin level, total     Labs today.  Continue current medications.  Follow up with Dr. Glean Hess at regularly scheduled appt.    Note to return to work without restrictions.        Plan:  Discussed use, benefit, and side effects of prescribed medications.  Barriers to medication compliance addressed.  All patient questions answered.  Pt voiced understanding.

## 2013-12-11 ENCOUNTER — Inpatient Hospital Stay: Admit: 2013-12-11 | Discharge: 2013-12-11 | Attending: Emergency Medicine

## 2013-12-11 LAB — PHENYTOIN LEVEL, TOTAL: Phenytoin Lvl: 5.4 ug/mL — ABNORMAL LOW (ref 10.0–20.0)

## 2013-12-11 MED ADMIN — phenytoin (DILANTIN) 1,090 mg in sodium chloride 0.9 % 100 mL IVPB (loading dose): 1090 mg/kg | INTRAVENOUS | @ 18:00:00 | NDC 00641255541

## 2013-12-11 MED FILL — PHENYTOIN SODIUM 50 MG/ML IJ SOLN: 50 MG/ML | INTRAMUSCULAR | Qty: 21.8

## 2013-12-11 MED FILL — LIDOCAINE-EPINEPHRINE 1 %-1:100000 IJ SOLN: 1 %-:00000 | INTRAMUSCULAR | Qty: 30

## 2013-12-11 NOTE — ED Provider Notes (Signed)
Attending Supervisory Note/Shared Visit   I have personally performed a face to face diagnostic evaluation on this patient. I have reviewed the mid-level???s findings and agree.  History and Exam by me shows 28 -year-old male who comes in after having a seizure about an hour ago. Patient said he was about to take his Lantus and when he sees. Has a laceration over his right eyebrow.  Tetanus is up-to-date. Recently got his medication adjusted. Sees Dr. Glean Hess.  He is alert and oriented at this time    Physical Exam   Constitutional: He is oriented to person, place, and time. He appears well-developed and well-nourished. No distress.   HENT:   Head: Normocephalic.   Right eyebrow laceration 3.5 cm   Eyes: EOM are normal. Pupils are equal, round, and reactive to light.   Neck: Normal range of motion. Neck supple.   Cardiovascular: Normal rate, regular rhythm and normal heart sounds.  Exam reveals no gallop and no friction rub.    No murmur heard.  Pulmonary/Chest: Effort normal and breath sounds normal. No respiratory distress. He has no wheezes. He exhibits no tenderness.   Abdominal: Soft. Bowel sounds are normal. He exhibits no distension. There is no tenderness. There is no rebound and no guarding.   Musculoskeletal: Normal range of motion. He exhibits no edema or tenderness.   Neurological: He is alert and oriented to person, place, and time.   Skin: Skin is warm and dry. No rash noted. No erythema. No pallor.   Psychiatric: He has a normal mood and affect. His behavior is normal. Judgment normal.     Patient is alert at baseline mentation-wise. Dilantin level was subtherapeutic.  Patient was given a loading dose of Dilantin here. Laceration was sutured by Casimiro Needle.  We'll have him follow-up with his neurologist. Plan discussed Casimiro Needle NP      (Please note that portions of this note were completed with a voice recognition program.  Efforts were made to edit the dictations but occasionally words are  mis-transcribed.)    Lovenia Shuck, MD  Attending Emergency Physician      Lovenia Shuck, MD  12/12/13 [phone]

## 2013-12-11 NOTE — ED Notes (Signed)
Bed: B-34  Expected date: 12/11/13  Expected time: 11:09 AM  Means of arrival:   Comments:  28 yo M - seizure, head lac

## 2013-12-11 NOTE — ED Provider Notes (Signed)
[facility] EMERGENCY DEPT  [address] [place] [address]  Dept: [phone]  Loc: [phone]  eMERGENCY dEPARTMENT eNCOUnter        CHIEF COMPLAINT  Chief Complaint   Patient presents with   ??? Seizures     unwitnessed   ??? Laceration     R eyebrow       HPI    Roy Weaver is a 28 y.o. male who presents with a seizure since the onset about 45 minutes prior to arrival. He states he was in his basement and did not expect seizure to happen. He currently takes Dilantin. He states he takes it every day but he had not taken today's dose. It's been about 2 months since he last had a breakthrough seizure. He recently had a medication adjustment by Dr. Glean Hess Regarding his Dilantin. He acknowledges a history of seizures as well. It was unwitnessed. States he hit his head and suffered a laceration. He is largely alert and oriented this time with minimal residual postictal state. He is alert and engaged appropriate.  Patient will laceration approximately 3 cm x 1 cm over right eyebrow.Tetanus is up-to-date.    REVIEW OF SYSTEMS    Cardiac: No chest pain or palpitations  Respiratory:No shortness of breath or new cough  General: No fevers or chills  GU: No dysuria or hematuria  GI: No vomiting or diarrhea  Musculoskeletal: Patient denies any shoulder pain or limited range of motion  See HPI for further details.  All other systems reviewed and are negative.    PAST MEDICAL OR SURGICAL HISTORY    Past Medical History   Diagnosis Date   ??? Seizures (HCC)    ??? Migraine      History reviewed. No pertinent past surgical history.    CURRENT MEDICATIONS    Current Outpatient Rx   Name  Route  Sig  Dispense  Refill   ??? phenytoin (DILANTIN) 100 MG ER capsule        Take 3 capsules by mouth twice a day    180 capsule    2     ??? nicotine (NICODERM CQ) 21 MG/24HR    Transdermal    Place 1 patch onto the skin every 24 hours.    30 patch    3         ALLERGIES    No Known Allergies    FAMILY OR SOCIAL  HISTORY    Family History   Problem Relation Age of Onset   ??? Diabetes Father      History     Social History   ??? Marital Status: Single     Spouse Name: N/A     Number of Children: N/A   ??? Years of Education: N/A     Social History Main Topics   ??? Smoking status: Former Smoker -- 0.50 packs/day for 2 years     Types: Cigarettes   ??? Smokeless tobacco: Former Neurosurgeon     Quit date: 10/04/2013   ??? Alcohol Use: No      Comment: stopped drinking   ??? Drug Use: No      Comment: stopped smoking marijuana    ??? Sexual Activity: None     Other Topics Concern   ??? None     Social History Narrative    ** Merged History Encounter **            PHYSICAL EXAM  VITAL SIGNS: BP 121/71    Pulse 85    Temp(Src) 98.4 ??F (36.9 ??C) (Oral)    Resp 15    Ht 6\' 4"  (1.93 m)    Wt 160 lb (72.576 kg)    BMI 19.48 kg/m2      SpO2 96%   Constitutional:  Well developed, well nourished, no acute distress  Eyes:  Pupils equally round and reactive to light, sclera nonicteric  HENT:  External ears normal, nose normal, oropharynx moist,patient with 3 x 1 cm just above the right eyebrow, there is no involvement of the galea, he will blink his right eye with location of the right eyelid.  NECK: Normal range of motion, no tenderness  Respiratory:  No respiratory distress, normal breath sounds, no wheezing   Cardiovascular:  regular rate, normal rhythm, no murmurs   GI:  Soft, nondistended, normal bowel sounds, nontender  Musculoskeletal:  No edema, no acute deformities   Integument:  Skin is warm and dry, no obvious rash   Neurologic:  Alert and oriented ??4 minimal residual postictal state at this time. Engaged and conversational with no obvious abnormalities   Vascular: Radial and DP pulses 2+ equal bilaterally        RADIOLOGY/PROCEDURES          Lac Repair  Date/Time: 12/11/2013 1:41 PM  Performed by: Marlene BastSHORE, Enora Trillo CHRISTOPHER  Authorized by: Janece CanterburyLE, KIMTHUY T    Consent:     Consent obtained:  Verbal    Consent given by:  Patient    Risks discussed:  Poor  cosmetic result and infection  Anesthesia (see MAR for exact dosages):     Anesthesia method:  Local infiltration    Local anesthetic:  Lidocaine 2% WITH epi  Laceration details:     Location:  Face    Length (cm):  3    Depth (mm):  1  Pre-procedure details:     Preparation:  Patient was prepped and draped in usual sterile fashion  Exploration:     Hemostasis achieved with:  Epinephrine    Wound extent comment:  No damage to the [place]    Contaminated: no    Treatment:     Area cleansed with:  Shur-Clens    Amount of cleaning:  Extensive    Irrigation solution:  Sterile saline    Irrigation volume:  Copious    Irrigation method:  Syringe  Skin repair:     Repair method:  Sutures    Suture size:  5-0    Suture material:  Prolene    Suture technique:  Simple interrupted    Number of sutures:  8  Approximation:     Approximation:  Close  Post-procedure details:     Patient tolerance of procedure:  Tolerated well, no immediate complications        ED COURSE & MEDICAL DECISION MAKING    Pertinent Labs & Imaging studies reviewed and interpreted. (See chart for details)  See chart for details of medications given during the ED stay.  Filed Vitals:    12/11/13 1201 12/11/13 1230 12/11/13 1246 12/11/13 1327   BP: 127/77 120/72 119/71 121/71   Pulse: 87 86 84 85   Temp:       TempSrc:       Resp: 12 18 15 15    Height:       Weight:       SpO2: 98% 95% 98% 96%       Differential diagnosis: Status Epilepticus, Breakthrough  Seizure, Intracranial Bleed, Brain Tumor, Hypoglycemia, neck fracture or other orthopedic fractures, posterior shoulder dislocation, other.    FINAL IMPRESSION    1. Seizure (HCC)    2. Laceration of forehead      See history of present illness regarding his presentation. Overall he is afebrile he medically stable and nontoxic. Known history of seizure disorder. He reports taking his medications. Chart review indicates his Dilantin level was low at an outpatient encounter the other day. He remains low at  this time. I spoke with his neurologist, Dr. Glean Hessajan regarding recent adjustments to his medication. Questionable compliance of his medications. Tenths have seizures when he is noncompliant reports last seizure 2 months ago. At this time patient a IV dose of Dilantin here in the emergency department and discharged home. Instructed to return in 5 days for suture removal. He is verbalized understanding and was discharged home.    (Please note that this note was completed with a voice recognition program.  Every attempt was made to edit the dictations, but inevitably there remain words that are mis-transcribed.)    This patient was seen and evaluated in conjunction with my attending physician,Dr Conley RollsLe, she is in agreement with the assessment and plan.    Electronically signed by: Vedia CofferMichael C Hana Trippett, CNP      Donney RankinsMichael Christopher Ohanna Gassert, NP  12/11/13 1345    Donney RankinsMichael Christopher Lyndee Herbst, NP  12/11/13 1345

## 2013-12-11 NOTE — Discharge Instructions (Signed)
Return for fever, chills, nausea vomiting loss of consciousness or any other concerns,    Follow-up with your outpatient neurologist    Take all medications as prescribed do not miss dosages of Dilantin    IMPORTANT:  If you have any trouble getting in to see the physician that we have referred you to today, please call the [facility] at [phone].  Please leave a voice message if they are unavailable and they will return your call.    If you were prescribed an outpatient test, please call [place] at [phone] to schedule an appointment for your test that was ordered.           ADDITIONAL INSTRUCTIONS FOR ALL PATIENTS:  -If you have been prescribed an antibiotic TAKE IT AS DIRECTED UNTIL IT IS ALL FINISHED.  -If you HAVE RECEIVED OR BEEN PRESCRIBED A MEDICATION THAT MAY CAUSE DROWSINESS. DO NOT DRIVE, DRINK ALCOHOL, OR OPERATE MACHINERY THAT REQUIRES YOU TO BE ALERT.    -If you had an EKG and/or X-Ray reading made in the Emergency Department, it will be reviewed by a Cardiologist and/or Radiologist. If the review changes your diagnosis, you will be contacted.  -If you had a specimen collected for culture, a CULTURE REPORT takes 48-72 hours to generate: You will be contacted if a change in treatment is needed.    -Return if your condition worsens or if you have severe pain, worsening of symptoms such as fever, vomiting or difficulty breathing.        Seizure: After Your Visit  Your Care Instructions     Seizures are caused by abnormal patterns of electrical signals in the brain. They are different for each person.  Seizures can affect movement, speech, vision, or awareness. Some people have only slight shaking of a hand and do not pass out. Other people may pass out and have violent shaking of the whole body. Some people appear to stare into space. They are awake, but they can't respond normally. Later, they may not remember what happened.  You may need tests to identify the type  and cause of the seizures.  A seizure may occur only once, or you may have them more than one time. Taking medicines as directed and following up with your doctor may help keep you from having more seizures.  The doctor has checked you carefully, but problems can develop later. If you notice any problems or new symptoms, get medical treatment right away.  Follow-up care is a key part of your treatment and safety. Be sure to make and go to all appointments, and call your doctor if you are having problems. It's also a good idea to know your test results and keep a list of the medicines you take.  How can you care for yourself at home?   Be safe with medicines. Take your medicines exactly as prescribed. Call your doctor if you think you are having a problem with your medicine.   Do not do any activity that could be dangerous to you or others until your doctor says it is safe to do so. For example, do not drive a car, operate machinery, swim, or climb ladders.   Be sure that anyone treating you for any health problem knows that you have had a seizure and what medicines you are taking for it.   Identify and avoid things that may make you more likely to have a seizure. These may include lack of sleep, alcohol or drug use,  stress, or not eating.   Make sure you go to your follow-up appointment.  When should you call for help?  Call 911 anytime you think you may need emergency care. For example, call if:   You have another seizure.   You have more than one seizure in 24 hours.   You have new symptoms, such as trouble walking, speaking, or thinking clearly.  Call your doctor now or seek immediate medical care if:   You are not acting normally.  Watch closely for changes in your health, and be sure to contact your doctor if you have any problems.   Where can you learn more?   Go to [email or web address] and sign in to your MyChart account. Enter [phone] in the Search Health Information box to learn  more about "Seizure: After Your Visit."    If you do not have an account, please click on the "Sign Up Now" link.      2006-2015 [employer], [employer]. Care instructions adapted under license by [facility]. This care instruction is for use with your licensed healthcare professional. If you have questions about a medical condition or this instruction, always ask your healthcare professional. Healthwise, Incorporated disclaims any warranty or liability for your use of this information.  Content Version: 10.5.422740; Current as of: July 13, 2013                Cuts on the Face Closed With Stitches: After Your Visit  Your Care Instructions  A cut on your face can be on your chin, cheek, nose, forehead, eyelid, lip, or ear.  The doctor used stitches to close the cut. Using stitches helps the cut heal and reduces scarring. The doctor may also have called in a specialist, such as a Engineer, petroleumplastic surgeon, to close the cut.  If the cut went deep and through the skin, the doctor may have put in two layers of stitches. The deeper layer brings the deep part of the cut together. These stitches will dissolve and don't need to be removed. The stitches in the upper layer are the ones you see on the cut.  You will probably have a bandage.  You will need to have the stitches removed, usually in 3 to 5 days.  The doctor has checked you carefully, but problems can develop later. If you notice any problems or new symptoms, get medical treatment right away.  Follow-up care is a key part of your treatment and safety. Be sure to make and go to all appointments, and call your doctor if you are having problems. It's also a good idea to know your test results and keep a list of the medicines you take.  How can you care for yourself at home?   Keep the cut dry for the first 24 to 48 hours. After this, you can shower if your doctor okays it. Pat the cut dry.   Don't soak the cut, such as in a bathtub. Your doctor will tell you when it's  safe to get the cut wet.   If your doctor told you how to care for your cut, follow your doctor's instructions. If you did not get instructions, follow this general advice:   After the first 24 to 48 hours, wash around the cut with clean water 2 times a day. Don't use hydrogen peroxide or alcohol, which can slow healing.   You may cover the cut with a thin layer of petroleum jelly, such as Vaseline, and a nonstick bandage.  Apply more petroleum jelly and replace the bandage as needed.   Avoid any activity that could cause your cut to reopen.   Do not remove the stitches on your own. Your doctor will tell you when to come back to have the stitches removed.   Be safe with medicines. Take pain medicines exactly as directed.   If the doctor gave you a prescription medicine for pain, take it as prescribed.   If you are not taking a prescription pain medicine, ask your doctor if you can take an over-the-counter medicine.  When should you call for help?  Call your doctor now or seek immediate medical care if:   You have new pain, or your pain gets worse.   The skin near the cut is cold or pale or changes color.   You have tingling, weakness, or numbness near the cut.   The cut starts to bleed, and blood soaks through the bandage. Oozing small amounts of blood is normal.   You have symptoms of infection, such as:   Increased pain, swelling, warmth, or redness around the cut.   Red streaks leading from the cut.   Pus draining from the cut.   A fever.  Watch closely for changes in your health, and be sure to contact your doctor if:   You do not get better as expected.   Where can you learn more?   Go to [email or web address] and sign in to your MyChart account. Enter [phone] in the Search Health Information box to learn more about "Cuts on the Face Closed With Stitches: After Your Visit."    If you do not have an account, please click on the "Sign Up Now" link.      2006-2015 Healthwise,  Incorporated. Care instructions adapted under license by [facility]. This care instruction is for use with your licensed healthcare professional. If you have questions about a medical condition or this instruction, always ask your healthcare professional. Healthwise, Incorporated disclaims any warranty or liability for your use of this information.  Content Version: 10.5.422740; Current as of: July 16, 2013
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
## 2013-12-26 NOTE — Progress Notes (Signed)
Roy GreeningErnest J Weaver   Neurology followup    Subjective:   CC/HP  History was obtained from the patient and his mother  Patient has known partial complex seizures  The seizures started 2013.  [facility].  Patient had a breakthrough seizure on December 11, 2013 illness [facility]  Dilantin level was low at 5.7  Patient's dose was increased to phenytoin 300 mg by mouth twice a day  Since that time patient states that his been feeling quite weak and dizzy  Patient has not had a level checked since that time    REVIEW OF SYSTEMS    Constitutional:  [x]    Chills   []   Fatigue   []   Fevers   []   Malaise   [x]   Weight loss     []  Denies all of the above    Respiratory:   []   Cough    []   Shortness of breath         []  Denies all of the above     Cardiovascular:   []   Chest pain    []   Exertional chest pressure/discomfort           []  Palpitations    []   Syncope     [x]  Denies all of the above        Past Medical History   Diagnosis Date   . Seizures (HCC)    . Migraine      Family History   Problem Relation Age of Onset   . Diabetes Father      History     Social History   . Marital Status: Single     Spouse Name: N/A     Number of Children: N/A   . Years of Education: N/A     Social History Main Topics   . Smoking status: Former Smoker -- 0.50 packs/day for 2 years     Types: Cigarettes   . Smokeless tobacco: Former NeurosurgeonUser     Quit date: 10/04/2013   . Alcohol Use: No      Comment: stopped drinking   . Drug Use: No      Comment: stopped smoking marijuana    . Sexual Activity: None     Other Topics Concern   . None     Social History Narrative    ** Merged History Encounter **             Objective:  Exam:  BP 111/70   Pulse 81   Ht 6\' 4"  (1.93 m)   Wt 170 lb 3.2 oz (77.202 kg)   BMI 20.73 kg/m2     This is a well-nourished patient in no acute distress  Patient is awake, alert and oriented x3. Speech is normal.  Pupils are equal round  reacting to light. Extraocular movements intact. Face symmetrical. Tongue midline.  Motor exam shows normal symmetrical strength. Deep tendon reflexes normal. Plantar reflexes downgoing.  Sensory exam normal. Coordination normal. Gait unsteady and ataxic. No carotid bruit. No neck stiffness.        Data :  LABS:  General Labs:    CBC:   Lab Results   Component Value Date    WBC 2.8 12/04/2013    RBC 4.23 12/04/2013    HGB 12.7 12/04/2013    HCT 38.4 12/04/2013    MCV 90.8 12/04/2013    MCH 30.1 12/04/2013  MCHC 33.2 12/04/2013    RDW 13.1 12/04/2013    PLT 288 12/04/2013    MPV 7.6 12/04/2013     BMP:    Lab Results   Component Value Date    NA 142 12/04/2013    K 4.9 12/04/2013    CL 104 12/04/2013    CO2 25 12/04/2013    BUN 10 12/04/2013    LABALBU 4.2 12/04/2013    CREATININE 0.6 12/04/2013    CREATININE 1.0 08/17/2011    CALCIUM 9.4 12/04/2013    GFRAA >60 12/04/2013    GFRAA 157 08/19/2011    LABGLOM >60 12/04/2013    GLUCOSE 94 12/04/2013     Impression :  Partial complex seizures,  Patient had a breakthrough seizure and his level was found to be low  Patient agrees that he had been noncompliant with medication at that time  Patient's Dilantin level was increased and now it looks like patient may have Dilantin toxicity  Patient has significant ataxia    Plan :  I will get a Dilantin level done today  Patient and his mother will call me tomorrow and we can decide on the further Dilantin dosage  Discussed at length with patient about the importance of taking medications regularly without fail      Please note a portion of  this chart was generated using dragon dictation software. Although every effort was made to ensure the accuracy of this automated transcription, some errors in transcription may have occurred.

## 2013-12-26 NOTE — Patient Instructions (Signed)
Please call with any questions or concerns:   [facility]  @ [phone].    LAB RESULTS:  Please obtain any labs or diagnostic tests as discussed today.   You may call the office to check the results.  Please allow  3 to 7 days for us to get these results.    MEDICATION LIST:  Please bring an accurate list of your medications to every visit.    APPOINTMENT CONFIRMATION:  We will call you the day before your scheduled appointment to confirm.   If we are unable to reach you, you MUST call back by the end of the day to confirm the appointment or we may be forced to cancel.

## 2013-12-27 ENCOUNTER — Telehealth

## 2013-12-27 LAB — PHENYTOIN LEVEL, TOTAL: Phenytoin Lvl: >40 ug/mL (ref 10.0–20.0)

## 2013-12-27 NOTE — Telephone Encounter (Signed)
Called patient and mother regarding dilantin level being high, were given instructions to not take Dilantin for the next three days and to restart on Sunday at 200 mg bid, repeat level in one week.

## 2014-01-15 ENCOUNTER — Inpatient Hospital Stay: Admit: 2014-01-15 | Discharge: 2014-01-15 | Attending: Emergency Medicine

## 2014-01-15 LAB — PHENYTOIN LEVEL, TOTAL: Phenytoin Lvl: 29.5 ug/mL — ABNORMAL HIGH (ref 10.0–20.0)

## 2014-01-15 MED ORDER — LEVETIRACETAM 500 MG PO TABS
500 MG | ORAL_TABLET | Freq: Two times a day (BID) | ORAL | Status: DC
Start: 2014-01-15 — End: 2014-02-15

## 2014-01-15 NOTE — ED Notes (Signed)
Seizure pads placed upon arrival on cart around patient     Arne [place], RN  01/15/14 1252

## 2014-01-15 NOTE — ED Notes (Addendum)
Pt presents to ED via [facility] with c/o of 2 seizures today. Report from EMS states that he did not hit his head but is not for sure. Pt is alert and oriented but does get some confusion with questions. Pt reports having pain in his right shoulder, 8/10, but is able to move the shoulder fully. VSS. Side rails padded. Pt states that it has been months since last seizures.     Londell Moh, RN  01/15/14 1236    Londell Moh, RN  01/15/14 [phone]

## 2014-01-15 NOTE — ED Notes (Signed)
Bed: C-21  Expected date:   Expected time:   Means of arrival: [facility]  Comments:  27 yr old/sz

## 2014-01-15 NOTE — ED Notes (Signed)
Pt had recent adjustment of phenytoin dosage r/t high levels. Pt arrives today post seizure activity. Pt reports being compliant with medication. Denies fever or illness    Arne [place], RN  01/15/14 1359

## 2014-01-15 NOTE — ED Provider Notes (Signed)
[facility] EMERGENCY DEPT  eMERGENCY dEPARTMENT eNCOUnter      Pt Name: Roy Weaver  MRN: [id number]  Birthdate [birth year]  Date of evaluation: 01/15/2014  Provider: Lovenia Shuck, MD    CHIEF COMPLAINT       Chief Complaint   Patient presents with   ??? Seizures       CRITICAL CARE TIME   Total Critical Care time was 0 minutes, excluding separately reportable procedures.  There was a high probability of clinically significant/life threatening deterioration in the patient's condition which required my urgent intervention.        HISTORY OF PRESENT ILLNESS  (Location/Symptom, Timing/Onset, Context/Setting, Quality, Duration, Modifying Factors, Severity.)   Roy Weaver is a 28 y.o. male who presents to the emergency department after having seizure has a history seizures take Dilantin 200 mg twice a day.  Mom says she witnessed a seizure today.  All him fall on the ground slurring his speech having a seizure. Patient states he's been taking his medication regularly mom states she's been watching him take his medication unsure why he keeps having seizures. States been sleeping normally no fever no chills. Has some pain in his right shoulder. No weakness or numbness.    Nursing Notes were reviewed.    REVIEW OF SYSTEMS    (2-9 systems for level 4, 10 or more for level 5)     Review of Systems   Constitutional: Negative for fever and chills.   HENT: Negative for congestion.    Respiratory: Negative for cough.    Gastrointestinal: Negative for nausea and vomiting.   Musculoskeletal: Positive for arthralgias. Negative for back pain.   Skin: Negative for wound.   Neurological: Positive for seizures. Negative for weakness, numbness and headaches.   Psychiatric/Behavioral: Negative for confusion.        Except as noted above the remainder of the review of systems was reviewed and negative.       PAST MEDICAL HISTORY         Diagnosis Date   ??? Seizures (HCC)    ??? Migraine        SURGICAL HISTORY     History reviewed. No  pertinent past surgical history.    CURRENT MEDICATIONS       Discharge Medication List as of 01/15/2014  2:48 PM      CONTINUE these medications which have NOT CHANGED    Details   phenytoin (DILANTIN) 100 MG ER capsule Take 3 capsules by mouth twice a day, Disp-180 capsule, R-2      nicotine (NICODERM CQ) 21 MG/24HR Place 1 patch onto the skin every 24 hours., Disp-30 patch, R-3             ALLERGIES     Review of patient's allergies indicates no known allergies.    FAMILY HISTORY           Problem Relation Age of Onset   ??? Diabetes Father      Family Status   Relation Status Death Age   ??? Father Alive    ??? Mother Alive         SOCIAL HISTORY      reports that he has quit smoking. His smoking use included Cigarettes. He has a 1 pack-year smoking history. He quit smokeless tobacco use about 3 months ago. He reports that he does not drink alcohol or use illicit drugs.    PHYSICAL EXAM    (up to 7 for level 4,  8 or more for level 5)   ED Triage Vitals   BP Temp Temp Source Pulse Resp SpO2 Height Weight   01/15/14 1229 01/15/14 1229 01/15/14 1229 01/15/14 1229 01/15/14 1229 01/15/14 1229 01/15/14 1229 01/15/14 1229   135/79 mmHg 98.3 ??F (36.8 ??C) Oral 79 14 94 %  (1.854 m) 176 lb (79.833 kg)       Physical Exam   Constitutional: He is oriented to person, place, and time. He appears well-developed and well-nourished. No distress.   HENT:   Head: Normocephalic and atraumatic.   Mouth/Throat: Oropharynx is clear and moist.   No tongue injury   Eyes: Conjunctivae and EOM are normal. Pupils are equal, round, and reactive to light.   Neck: Normal range of motion. Neck supple.   Cardiovascular: Normal rate, regular rhythm, normal heart sounds and intact distal pulses.    No murmur heard.  Pulmonary/Chest: Effort normal and breath sounds normal. No respiratory distress. He has no wheezes. He has no rales.   Abdominal: Soft. Bowel sounds are normal. He exhibits no distension. There is no tenderness. There is no rebound and  no guarding.   Musculoskeletal: Normal range of motion. He exhibits tenderness. He exhibits no edema.   Right shoulder tenderness but has full range of motion no deformity. Normal radial pulses.   Neurological: He is alert and oriented to person, place, and time. He has normal strength. No cranial nerve deficit or sensory deficit. He exhibits normal muscle tone. GCS eye subscore is 4. GCS verbal subscore is 5. GCS motor subscore is 6.   Skin: Skin is warm and dry. No rash noted. No erythema. No pallor.   Psychiatric: He has a normal mood and affect. Judgment normal.           DIAGNOSTIC RESULTS     EKG: All EKG's are interpreted by the Emergency Department Physician who either signs or Co-signs this chart in the absence of a cardiologist.        RADIOLOGY:   Non-plain film images such as CT, Ultrasound and MRI are read by the radiologist. Plain radiographic images are visualized and preliminarily interpreted by the emergency physician with the below findings:        Interpretation per the Radiologist below, if available at the time of this note:    XR SHOULDER RIGHT STANDARD    Final Result: IMPRESSION: No radiographic findings to account for the patient's     symptoms         ED BEDSIDE ULTRASOUND:   Performed by ED Physician - none    LABS:  Labs Reviewed   PHENYTOIN LEVEL, TOTAL - Abnormal; Notable for the following:     Phenytoin Lvl 29.5 (*)     All other components within normal limits       All other labs were within normal range or not returned as of this dictation.    EMERGENCY DEPARTMENT COURSE and DIFFERENTIAL DIAGNOSIS/MDM:   Vitals:    Filed Vitals:    01/15/14 1229 01/15/14 1356 01/15/14 1448   BP: 135/79 122/74 122/80   Pulse: 79 75 87   Temp: 98.3 ??F (36.8 ??C) 98.4 ??F (36.9 ??C) 98.4 ??F (36.9 ??C)   TempSrc: Oral Oral Oral   Resp: Height:  (1.854 m)     Weight: 176 lb (79.833 kg)     SpO2: 94% 98% 98%       I spoke with his neurologist who thinks  he is noncompliant with his medication  has seizures and they keep increasing his seizure medication and then he starts taking it and gets increased levels.  Wants to decrease his medication will take him off of that and start him on Keppra. Told to stop taking his medications for 2 days and then afterward can take 100 mg in the morning and 200 at night. And start him on Keppra 500 mg twice a day. Patient has been acting appropriately here the whole time. Neurologically intact. x-ray did not show any fracture or dislocation of his right shoulder most likely just strained it when he had his seizures.    CONSULTS:  IP CONSULT TO NEUROLOGY    PROCEDURES:  None    FINAL IMPRESSION      1. Convulsions, unspecified convulsion type (HCC)    2. Dilantin overdose, accidental or unintentional, initial encounter          DISPOSITION/PLAN   DISPOSITION Decision to Discharge    PATIENT REFERRED TO:  Salvadore Farber, MD  [address], [address]  [place] [address]  [phone]    In 1 week  Please call tomorrow to schedule appointment to follow-up with the neurologist next week      DISCHARGE MEDICATIONS:  Discharge Medication List as of 01/15/2014  2:48 PM      START taking these medications    Details   levETIRAcetam (KEPPRA) 500 MG tablet Take 1 tablet by mouth 2 times daily, Disp-60 tablet, R-0             (Please note that portions of this note were completed with a voice recognition program.  Efforts were made to edit the dictations but occasionally words are mis-transcribed.)    Lovenia Shuck, MD  Attending Emergency Physician        Lovenia Shuck, MD  01/15/14 2231

## 2014-01-18 NOTE — Telephone Encounter (Signed)
Patient had a seizure this week and was taken to the hospital. Keppra was added 500 mg bid , takes depakote 100 mg in the morning and 200 mg at night.

## 2014-02-07 NOTE — Progress Notes (Signed)
Results verified in chart review.  Test(s) completed.

## 2014-02-13 ENCOUNTER — Inpatient Hospital Stay: Admit: 2014-02-13 | Discharge: 2014-02-14

## 2014-02-13 MED FILL — LORAZEPAM 2 MG/ML IJ SOLN: 2 MG/ML | INTRAMUSCULAR | Qty: 1

## 2014-02-13 NOTE — ED Notes (Signed)
Arrived by [facility] squad for witnessed seizure 3-5 minutes while at [employer].  Hx of seizures but states he has not had one in "months".  Tired, post dictal, but A&O x 4.  Squad stated he was initially "unresponsive" when they arrived but that he started to wake up in route.  FSBS 96 per squad.  Seizure pads placed on bed.  NSR on monitor.  resp easy and unlabored.  Skin warm, dry, natural in color.  Call light in reach, side rails up x 2, bed locked in low position, bed alarm on.  Lungs CTA, strong radial pulses bilat.    Shaune Leeks Dilia Alemany, RN  02/13/14 1816

## 2014-02-13 NOTE — ED Notes (Signed)
Pt more awake alert and oriented, pt undressed and put into a gown, pt family is at the bed side knows of possible admit to hosp.     Osvaldo Shipper, RN  02/13/14 2350

## 2014-02-13 NOTE — ED Provider Notes (Signed)
Attending Supervisory Note/Shared Visit   I have personally performed a face to face diagnostic evaluation on this patient. I have reviewed the mid-level???s findings and agree.  History and physical exam by me shows 28 yo male with epilepsy had a witnessed seizure earlier today.  He cannot recall any details of the event, and is asymptomatic, conversational, oriented in the ED.  He believes his seizures happen when he does not eat; he insists he is compliant with his medication, but he doesn't always eat a meal with the medication.  He is declining any workup and requesting to leave.  Patient had the capacity to make medical decisions and signed himself out AMA.          Mickle Asper, MD  Attending Emergency Physician        Mickle Asper, MD  02/14/14 [phone]

## 2014-02-13 NOTE — ED Notes (Signed)
Bed: 06-01  Expected date:   Expected time:   Means of arrival:   Comments:  squad

## 2014-02-13 NOTE — ED Notes (Addendum)
Family called from room that pt was having a seizure. Pt noted to be have grandmal seizure. Pt postictal Oral suction provided Placed pt on 6L of 02. . Dr. Rande Lawman at bedside. Pt Pt is on a continuous pulse oximetry and telemetry monitoring. Pt continued on cycling blood pressure. Fall risk precautions in place, call light in reach, bed side table within reach, bed alarm on, will continue to monitor. VSS and wnl. Friend at bedside lowered o2 to 3L         Montel Culver, RN  02/13/14 2206    Montel Culver, RN  02/13/14 2207

## 2014-02-13 NOTE — ED Notes (Signed)
Pt. Refuses blood draws, EKG, and any medical treatment at this time.  Pt. Signed AMA form.  Dr. Sharlette Dense and TT Pa in to speak with pt.  Pt. Left with friend.     Jacquelin Hawking, RN  02/13/14 2000

## 2014-02-13 NOTE — ED Notes (Signed)
Bed: 12-01  Expected date:   Expected time:   Means of arrival:   Comments:  squad

## 2014-02-13 NOTE — ED Notes (Signed)
Pt resting quietly resp easy and even, o2 was at 4l nc 98% decreased to 2l nc to see if maintains sat. Seizure pads in place. VS cycling q15 min    Osvaldo Shipper, RN  02/13/14 2251

## 2014-02-13 NOTE — ED Provider Notes (Signed)
HPI patient is brought to the emergency room with a seizure witnessed by a friend at home.  He was seen here earlier this evening with after seizure when he became awake and alert he requested to leave AMA before any further testing was done.   He had 30 seizure after arrival here.  He is postictal history of chief complaint and review of systems not obtainable from patient.  Patient is here with his best friend from childhood and current roommate who states that he thinks the patient not been taking his medication that this happened last year when he had numerous recurrent seizures.     Review of Systems   He is postictal history of chief complaint and review of systems not obtainable from patient  Physical Exam  GENERAL: Patient appeared well-developed, well-nourished, vital signs reviewed.  MENTAL STATUS: Patient is postictal at this time nonresponsive  HEAD AND FACE :Head is normal cephalic. Face normal appearance  EYES: eyes lids and conjunctiva appear normal.   ENT :ears and nose appear normal externally. Throat has normal appearing oral mucosa  NECK: Neck without any masses.    LYMPHATIC: No cervical Lymphadenopathy,    CV: Heart tachycardic rate and rhythm without murmur, S3 or S4.  No JVD . No carotid bruits. No pedal edema. Pulses equal bilaterally.  LUNGS: Lungs has diffuse rhonchi without wheezing or rales, . Normal respiratory effort.  CHEST WALL: normal appearance. normal motion  ABDOMEN: Abdomen is soft to palpation. Non tender. Bowel sounds are present in all 4 quadrants No masses palpable. No CV tenderness present. No Bruits present. No Hepatosplenomegaly noted  MUSCULOSKELETAL:  No joint deformities, normal muscle strength and tone. Marland Kitchen  EXTREMITIES: Extremities moves all 4 Extremities without any weakness  There is no calf tenderness or lower extremity edema  SKIN: Skin shows no  rashes  PSYCHIATRIC:mood and affect normal  Judgement and orientation normal  NEUROLOGIC: Patient postictal exam  limited no gross deficits noted   This is a computerized dictation. I have made every effort to proofread this chart, however, transcription errors may exist.     Procedures    MDMpatient is becoming awake and alert he is still sleepy. She is oriented to person and place. No gross neurological deficits. Patient Dilantin level is less than 0.8. We'll contact the hospitalist for admission.     Labs  Labs Reviewed   CBC WITH AUTO DIFFERENTIAL - Abnormal; Notable for the following:     WBC 15.8 (*)     Neutrophils Absolute 8.4 (*)     Lymphocytes Absolute 6.0 (*)     Atypical Lymphocytes Relative 26 (*)     Smudge Cells Present (*)     All other components within normal limits   BASIC METABOLIC PANEL - Abnormal; Notable for the following:     CO2 12 (*)     Anion Gap 32 (*)     Glucose 152 (*)     All other components within normal limits    Narrative:     CALL  [facility]  [facility] tel. [phone],  Chemistry results called to and read back by Rosanne Gutting, 02/13/2014 22:44,  by Michel Harrow   PHENYTOIN LEVEL, TOTAL - Abnormal; Notable for the following:     Phenytoin Lvl <0.8 (*)     All other components within normal limits    Narrative:     CALL  [facility] tel. [phone],  Chemistry results called to and read back by Rosanne Gutting, 02/13/2014  22:44,  by CASMI   LEVETIRACETAM LEVEL   URINE RT REFLEX TO CULTURE   URINE DRUG SCREEN       Radiology    02/13/2014   AP PORTABLE CHEST. Feb 13, 2014 10:31:35 PM   INDICATION: seizure   COMPARISON: 09/13/2013   FINDINGS: The heart, lungs and pulmonary vessels are normal. No free fluid is identified. 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 Cardiac monitor leads are present along with oxygen tubing.      02/13/2014   IMPRESSION: Stable chest. No evidence of acute cardiac or pulmonary disease.    EKG Interpretation.        Tomasa Hosteller, DO  02/13/14 2328

## 2014-02-14 ENCOUNTER — Inpatient Hospital Stay
Admit: 2014-02-14 | Disposition: A | Discharge status: Home / Self Care | Payer: PRIVATE HEALTH INSURANCE | Source: Home / Self Care | Admitting: Internal Medicine

## 2014-02-14 LAB — CBC WITH AUTO DIFFERENTIAL
Atypical Lymphocytes Relative: 26 % — ABNORMAL HIGH (ref 0–6)
Basophils %: 0 %
Basophils %: 0.1 %
Basophils Absolute: 0 10*3/uL (ref 0.0–0.2)
Basophils Absolute: 0 10*3/uL (ref 0.0–0.2)
Eosinophils %: 1 %
Eosinophils %: 1.1 %
Eosinophils Absolute: 0.2 10*3/uL (ref 0.0–0.6)
Eosinophils Absolute: 0.1 10*3/uL (ref 0.0–0.6)
Hematocrit: 43.8 % (ref 40.5–52.5)
Hematocrit: 38.6 % — ABNORMAL LOW (ref 40.5–52.5)
Hemoglobin: 14 g/dL (ref 13.5–17.5)
Hemoglobin: 13 g/dL — ABNORMAL LOW (ref 13.5–17.5)
Lymphocytes %: 12 %
Lymphocytes %: 31.8 %
Lymphocytes Absolute: 6 10*3/uL — ABNORMAL HIGH (ref 1.0–5.1)
Lymphocytes Absolute: 3 10*3/uL (ref 1.0–5.1)
MCH: 30.2 pg (ref 26.0–34.0)
MCH: 30.5 pg (ref 26.0–34.0)
MCHC: 32.1 g/dL (ref 31.0–36.0)
MCHC: 33.6 g/dL (ref 31.0–36.0)
MCV: 94.3 fL (ref 80.0–100.0)
MCV: 91 fL (ref 80.0–100.0)
MPV: 7.9 fL (ref 5.0–10.5)
MPV: 7.7 fL (ref 5.0–10.5)
Monocytes %: 8 %
Monocytes %: 7.1 %
Monocytes Absolute: 1.3 10*3/uL (ref 0.0–1.3)
Monocytes Absolute: 0.7 10*3/uL (ref 0.0–1.3)
Neutrophils %: 53 %
Neutrophils %: 59.9 %
Neutrophils Absolute: 8.4 10*3/uL — ABNORMAL HIGH (ref 1.7–7.7)
Neutrophils Absolute: 5.7 10*3/uL (ref 1.7–7.7)
PLATELET SLIDE REVIEW: ADEQUATE
Platelets: 274 10*3/uL (ref 135–450)
Platelets: 233 10*3/uL (ref 135–450)
RBC Morphology: NORMAL
RBC: 4.64 M/uL (ref 4.20–5.90)
RBC: 4.25 M/uL (ref 4.20–5.90)
RDW: 14.4 % (ref 12.4–15.4)
RDW: 13.9 % (ref 12.4–15.4)
WBC: 15.8 10*3/uL — ABNORMAL HIGH (ref 4.0–11.0)
WBC: 9.6 10*3/uL (ref 4.0–11.0)

## 2014-02-14 LAB — URINE DRUG SCREEN
Amphetamine Screen, Urine: NEGATIVE
Barbiturate Screen, Ur: NEGATIVE (ref <200)
Benzodiazepine Screen, Urine: NEGATIVE (ref <200)
Cannabinoid Scrn, Ur: POSITIVE — AB (ref <50)
Cocaine Metabolite Screen, Urine: NEGATIVE (ref <300)
Methadone Screen, Urine: NEGATIVE (ref <300)
Opiate Scrn, Ur: NEGATIVE (ref <300)
PCP Screen, Urine: NEGATIVE (ref <25)
Propoxyphene Scrn, Ur: NEGATIVE (ref <300)
pH, UA: 5.5

## 2014-02-14 LAB — URINALYSIS WITH REFLEX TO CULTURE
Bilirubin Urine: NEGATIVE
Blood, Urine: NEGATIVE
Glucose, Ur: NEGATIVE mg/dL
Ketones, Urine: NEGATIVE mg/dL
Leukocyte Esterase, Urine: NEGATIVE
Nitrite, Urine: NEGATIVE
Specific Gravity, UA: 1.02 (ref 1.005–1.030)
Urobilinogen, Urine: 0.2 E.U./dL (ref <2.0)
pH, UA: 5.5 (ref 5.0–8.0)

## 2014-02-14 LAB — BASIC METABOLIC PANEL
Anion Gap: 32 — ABNORMAL HIGH (ref 3–16)
Anion Gap: 12 (ref 3–16)
BUN: 8 mg/dL (ref 7–20)
BUN: 9 mg/dL (ref 7–20)
CO2: 12 mmol/L — CL (ref 21–32)
CO2: 27 mmol/L (ref 21–32)
Calcium: 10 mg/dL (ref 8.3–10.6)
Calcium: 9.2 mg/dL (ref 8.3–10.6)
Chloride: 101 mmol/L (ref 99–110)
Chloride: 104 mmol/L (ref 99–110)
Creatinine: 0.9 mg/dL (ref 0.9–1.3)
Creatinine: 0.8 mg/dL — ABNORMAL LOW (ref 0.9–1.3)
GFR African American: >60 (ref >60)
GFR African American: >60 (ref >60)
GFR Non-African American: >60 (ref >60)
GFR Non-African American: >60 (ref >60)
Glucose: 152 mg/dL — ABNORMAL HIGH (ref 70–99)
Glucose: 94 mg/dL (ref 70–99)
Potassium: 3.6 mmol/L (ref 3.5–5.1)
Potassium: 4 mmol/L (ref 3.5–5.1)
Sodium: 145 mmol/L (ref 136–145)
Sodium: 143 mmol/L (ref 136–145)

## 2014-02-14 LAB — EKG 12-LEAD
Atrial Rate: 94 {beats}/min
P Axis: 41 degrees
P-R Interval: 188 ms
Q-T Interval: 336 ms
QRS Duration: 86 ms
QTc Calculation (Bazett): 420 ms
R Axis: 82 degrees
T Axis: 35 degrees
Ventricular Rate: 94 {beats}/min

## 2014-02-14 LAB — MICROSCOPIC URINALYSIS
Epithelial Cells, UA: 1 /HPF (ref 0–5)
Hyaline Casts, UA: 3 /HPF (ref 0–8)
RBC, UA: 0 /HPF (ref 0–4)
WBC, UA: 3 /HPF (ref 0–5)

## 2014-02-14 LAB — PHENYTOIN LEVEL, TOTAL: Phenytoin Lvl: <0.8 ug/mL — ABNORMAL LOW (ref 10.0–20.0)

## 2014-02-14 LAB — LEVETIRACETAM LEVEL: Levetiracetam Lvl: <2 ug/mL — ABNORMAL LOW (ref 6.0–46.0)

## 2014-02-14 LAB — PERIPHERAL BLOOD SMEAR, PATH REVIEW

## 2014-02-14 MED ORDER — DIVALPROEX SODIUM ER 500 MG PO TB24
500 MG | Freq: Every day | ORAL | Status: DC
Start: 2014-02-14 — End: 2014-02-15
  Administered 2014-02-14 – 2014-02-15 (×2): 1000 mg via ORAL

## 2014-02-14 MED ORDER — ACETAMINOPHEN 325 MG PO TABS
325 | ORAL | Status: DC | PRN
Start: 2014-02-14 — End: 2014-02-15

## 2014-02-14 MED ORDER — LEVETIRACETAM 500 MG PO TABS
500 MG | Freq: Two times a day (BID) | ORAL | Status: DC
Start: 2014-02-14 — End: 2014-02-15
  Administered 2014-02-14 – 2014-02-15 (×3): 750 mg via ORAL

## 2014-02-14 MED ADMIN — LORazepam (ATIVAN) injection 1 mg: 1 mg | INTRAVENOUS | @ 02:00:00 | NDC 00641604401

## 2014-02-14 MED ADMIN — sodium chloride flush 0.9 % injection 10 mL: 10 mL | INTRAVENOUS | @ 14:00:00

## 2014-02-14 MED ADMIN — levETIRAcetam (KEPPRA) 1,000 mg in sodium chloride 0.9 % 100 mL IVPB: 1000 mg | INTRAVENOUS | @ 04:00:00 | NDC 50474000263

## 2014-02-14 MED ADMIN — enoxaparin (LOVENOX) injection 40 mg: 40 mg | SUBCUTANEOUS | @ 14:00:00 | NDC 00075801401

## 2014-02-14 MED FILL — LEVETIRACETAM IN NACL 1000 MG/100ML IV SOLN: 1000 MG/100ML | INTRAVENOUS | Qty: 100

## 2014-02-14 MED FILL — LEVETIRACETAM 500 MG PO TABS: 500 MG | ORAL | Qty: 2

## 2014-02-14 MED FILL — KEPPRA 500 MG/5ML IV SOLN: 500 MG/5ML | INTRAVENOUS | Qty: 10

## 2014-02-14 MED FILL — PHENYTOIN SODIUM 50 MG/ML IJ SOLN: 50 MG/ML | INTRAMUSCULAR | Qty: 20

## 2014-02-14 MED FILL — LOVENOX 40 MG/0.4ML SC SOLN: 40 MG/0.4ML | SUBCUTANEOUS | Qty: 0.4

## 2014-02-14 MED FILL — LORAZEPAM 2 MG/ML IJ SOLN: 2 MG/ML | INTRAMUSCULAR | Qty: 1

## 2014-02-14 MED FILL — DEPAKOTE ER 500 MG PO TB24: 500 MG | ORAL | Qty: 2

## 2014-02-14 NOTE — Other (Addendum)
Patient Acct Nbr:  [id number]  Primary AUTH/CERT:    Primary Insurance Company Name:   [employer]/HMO  Primary Insurance Plan Name:  [facility]ID [facility]  Primary Insurance Group Number:  [id number]  Primary Insurance Plan Type: [employer]  Primary Insurance Policy Number:  [id number]

## 2014-02-14 NOTE — Plan of Care (Signed)
Problem: SAFETY  Goal: Free from accidental physical injury  Outcome: Ongoing  Pt high fall risk, bed alarm on. Instructed pt not to get out of bed without assistance from staff. Pt v/u    Problem: PAIN  Goal: Patient???s pain/discomfort is manageable  Outcome: Ongoing  Pt denies pain    Problem: KNOWLEDGE DEFICIT  Goal: Patient/S.O. demonstrates understanding of disease process, treatment plan, medications, and discharge instructions.  Outcome: Ongoing  Spoke with pt about seizure precautions and poc for admission. Pt denies questions at this time.

## 2014-02-14 NOTE — Progress Notes (Signed)
Pt admitted from ED. VSS. Pt placed in seizure precautions with seizure pads. Pt very lethargic but awakes when aroused and answers appropriately. Video monitor on to monitor for seizure activity. Pt oriented to room.

## 2014-02-14 NOTE — Consults (Signed)
NEUROLOGY CONSULTATION     Patient's Name :   Roy Weaver        Date of birth:   [birth year]                    Date of Consultation : 02/14/2014 9:07 AM    Referring Physician :  Lolita Lenz, MD    Reason for Consultation :    Intractable seizure disorder    HISTORY OF PRESENT ILLNESS :    Roy Weaver is a 28 y.o. male   History was obtained from patient and from dictations in the chart  Patient was admitted to the hospital after he had 3 generalized tonic-clonic seizures as an outpatient  Patient has a long history of seizures.  I have seen him in the office before for this condition.  Patient has been noncompliant with his medications.  He has been seen in the emergency room a few times with low antiepileptic medication levels  Yesterday when he came to the emergency room both Dilantin and Keppra levels are very low  This morning patient admits that he has missed a few doses but could not be specific about holidays CNS  He complains of generalized aching and pain all over from the recurrent seizures  He denies any focal weakness vertigo or diplopia      REVIEW OF SYSTEMS  Patient denies any chest pain palpitations breathlessness abdominal pain fever or weight loss  Rest of the 10 system review was reviewed and was negative except for the symptoms noted above  Past Medical History   Diagnosis Date   ??? Seizures (HCC)    ??? Migraine      Family History   Problem Relation Age of Onset   ??? Diabetes Father      History     Social History   ??? Marital Status: Single     Spouse Name: N/A     Number of Children: N/A   ??? Years of Education: N/A     Social History Main Topics   ??? Smoking status: Current Every Day Smoker -- 0.50 packs/day for 2 years     Types: Cigarettes   ??? Smokeless tobacco: Former Neurosurgeon     Quit date: 10/04/2013   ??? Alcohol Use: 0.0 oz/week     0 Not specified per week   ??? Drug Use: No      Comment: stopped smoking marijuana    ???  Sexual Activity: None     Other Topics Concern   ??? None     Social History Narrative    ** Merged History Encounter **          Current Facility-Administered Medications   Medication Dose Route Frequency Provider Last Rate Last Dose   ??? sodium chloride flush 0.9 % injection 10 mL  10 mL Intravenous 2 times per day Waymond Cera, MD       ??? sodium chloride flush 0.9 % injection 10 mL  10 mL Intravenous PRN Waymond Cera, MD       ??? acetaminophen (TYLENOL) tablet 650 mg  650 mg Oral Q4H PRN Chalana Jillene Bucks, MD       ??? magnesium hydroxide (MILK OF MAGNESIA) 400 MG/5ML suspension 30 mL  30 mL Oral Daily PRN Waymond Cera, MD       ??? ondansetron (ZOFRAN) injection 4 mg  4 mg Intravenous Q6H PRN Waymond Cera, MD       ???  enoxaparin (LOVENOX) injection 40 mg  40 mg Subcutaneous Daily Waymond Cera, MD       ??? potassium chloride SA (K-DUR;KLOR-CON M) tablet 40 mEq  40 mEq Oral PRN Waymond Cera, MD        Or   ??? potassium chloride (KAYCIEL) 20 MEQ/15ML (10%) solution 40 mEq  40 mEq Oral PRN Waymond Cera, MD        Or   ??? potassium chloride 10 mEq/100 mL IVPB (Peripheral Line)  10 mEq Intravenous PRN Waymond Cera, MD       ??? potassium chloride 10 mEq/100 mL IVPB (Peripheral Line)  10 mEq Intravenous PRN Waymond Cera, MD       ??? magnesium sulfate 1 g in dextrose 5% 100 mL IVPB (premix)  1 g Intravenous PRN Waymond Cera, MD       ??? LORazepam (ATIVAN) injection 2 mg  2 mg Intravenous Q4H PRN Waymond Cera, MD       ??? levETIRAcetam (KEPPRA) tablet 750 mg  750 mg Oral BID Salvadore Farber, MD       ??? divalproex (DEPAKOTE ER) ER tablet 1,000 mg  1,000 mg Oral Daily Salvadore Farber, MD       ??? LORazepam (ATIVAN) 2 MG/ML injection              No Known Allergies    PHYSICAL EXAMINATION:  BP 118/61 mmHg   Pulse 85   Temp(Src) 98.6 ??F (37 ??C) (Temporal)   Resp 18   Ht  (1.88 m)   Wt 167 lb 8 oz (75.978 kg)   BMI 21.50 kg/m2   SpO2  96%  Appearance: Well appearing, well nourished and in no distress  Mental Status Exam: Patient is alert, oriented to person, place and time.   Recent and remote memory is normal  Fund of Knowledge is normal  Attention/concentration is normal.   Speech : No dysarthria  Language : No aphasia  Funduscopic Exam: sharp disc margins  Cranial Nerves:   II: Visual fields:  Full to confrontation  III: Pupils:  equal, round, reactive to light  III,IV,VI: Extra Ocular Movements are intact. No nystagmus  V: Facial sensation is intact to pin prick and light touch  VII: Facial strength and movements: intact and symmetric smile,cheek puffing and eyebrow elevation  VIII: Hearing:  Intact to finger rub bilaterally  IX: Palate  elevation is symmetric  XI: Shoulder shrug is intact  XII: Tongue movements are normal  Motor:  Muscle tone and bulk are normal.   Strength is symmetrical 5/5 in all four extremities.  Sensory: Intact to light touch and  pin prick in all four extremities  Coordination:  Normal  Finger to Nose and Heel to Shin bilaterally    .Reflexes:  DTR +2 and symmetric bilaterally  Plantar response: Flexor bilaterally  Gait: Gait could not be tested   Romberg: Could not be tested  Vascular: No carotid bruit bilaterally        DATA:  LABS:  General Labs:    CBC:   Lab Results   Component Value Date    WBC 9.6 02/14/2014    RBC 4.25 02/14/2014    HGB 13.0 02/14/2014    HCT 38.6 02/14/2014    MCV 91.0 02/14/2014    MCH 30.5 02/14/2014    MCHC 33.6 02/14/2014    RDW 13.9 02/14/2014    PLT 233 02/14/2014    MPV 7.7 02/14/2014  BMP:    Lab Results   Component Value Date    NA 143 02/14/2014    K 4.0 02/14/2014    CL 104 02/14/2014    CO2 27 02/14/2014    BUN 9 02/14/2014    LABALBU 4.2 12/04/2013    CREATININE 0.8 02/14/2014    CREATININE 1.0 08/17/2011    CALCIUM 9.2 02/14/2014    GFRAA >60 02/14/2014    GFRAA 157 08/19/2011    LABGLOM >60 02/14/2014    GLUCOSE 94 02/14/2014     RADIOLOGY REVIEW:  I have reviewed radiology  image(s) and reports(s) of:  CT scan of the head    IMPRESSION :  Intractable partial complex seizures  Noncompliance with medication treatment  Low  anti-epilepsy medication levels  Chronic migraine headaches  Patient Active Problem List   Diagnosis   ??? Seizures (HCC)   ??? Migraine   ??? Recurrent seizures (HCC)   ??? History of nonadherence to medical treatment   ??? Delirium   ??? Drug use   ??? Localization-related (focal) (partial) epilepsy and epileptic syndromes with complex partial seizures, without mention of intractable epilepsy (HCC)   ??? Seizures (HCC)     RECOMMENDATIONS :  Discussed at length with patient  Explained the need for taking seizure medications regularly and faithfully  Patient is well aware of the driving restrictions after recurrent seizures  I will change the Keppra to 750 mg by mouth twice a day  I will stop the Dilantin and switch him to Depakote ER 1000 mg daily  I will get an EEG as well  Patient will be monitored carefully for the next 24 hours  He will need a Depakote level, CBC and liver enzymes after a few days  Thank you for this consultation        Please note a portion of this chart was generated using dragon dictation software. Although every effort was made to ensure the accuracy of this automated transcription, some errors in transcription may have occurred.

## 2014-02-14 NOTE — Plan of Care (Signed)
Problem: Falls - Risk of  Goal: Absence of falls  Outcome: Ongoing  Pt med fall risk, had a fall during a seizure. According to dayshift rn bed alarm and camera off and pt is allowed per md to ambulate in room independently. Instructed pt to call me if he feels he needs assistance.     Problem: PAIN  Goal: Patient???s pain/discomfort is manageable  Outcome: Ongoing  Pt denies any pain at this time. Instructed pt to call nurse if this should change.

## 2014-02-14 NOTE — ED Notes (Signed)
Report given to Sue Lush RN on 3T, all questions answered. Pt belongings on bed with pt. Pt family at bed side and aware of admission. Tele pack on pt for transport, floor verifies rhythm on their monitor.     Osvaldo Shipper, RN  02/14/14 [phone]

## 2014-02-14 NOTE — H&P (Signed)
[facility] HISTORY [facility] PHYSICAL    02/14/2014 12:27 AM    Patient Information:  Roy Weaver is a 28 y.o. male [id number]  PCP:  Baldwin Jamaica, MD (Tel: None )    Chief complaint:    Chief Complaint   Patient presents with   ??? Seizures     witnessed seizure- the 2nd today was here earlier and left AMA.        History of Present Illness:  Roy Weaver is a 28 y.o. male who presented with 3 episodes of generalized tonic clonic seizures. As per his room mate pt is not compliant with his medications. Pt is somewhat confused still due to post ictal state.    History obtained from ED records.   Old medical records show f/w Dr. Glean Hess  Patient unable to provide history/review of system due to post ictal state    REVIEW OF SYSTEMS:   Patient unable to provide history/review of system due to post ictal state    Past Medical History:   has a past medical history of Seizures (HCC) and Migraine.     Past Surgical History:   has no past surgical history on file.     Medications:  No current facility-administered medications on file prior to encounter.     Current Outpatient Prescriptions on File Prior to Encounter   Medication Sig Dispense Refill   ??? levETIRAcetam (KEPPRA) 500 MG tablet Take 1 tablet by mouth 2 times daily 60 tablet 0   ??? phenytoin (DILANTIN) 100 MG ER capsule Take 3 capsules by mouth twice a day (Patient taking differently: Take 200 mg by mouth 2 times daily Take 3 capsules by mouth twice a day) 180 capsule 2   ??? nicotine (NICODERM CQ) 21 MG/24HR Place 1 patch onto the skin every 24 hours. 30 patch 3       Allergies:  No Known Allergies     Social History:   reports that he has been smoking Cigarettes.  He has a 1 pack-year smoking history. He quit smokeless tobacco use about 4 months ago. He reports that he drinks alcohol. He reports that he does not use illicit drugs.     Family History:  family history includes Diabetes in his father.     Physical Exam:  BP 125/67 mmHg   Pulse 99    Temp(Src) 100 ??F (37.8 ??C) (Oral)   Resp 18   Ht  (1.88 m)   Wt 190 lb (86.183 kg)   BMI 24.38 kg/m2   SpO2 95%    General appearance:  Appears comfortable. Well nourished  Eyes: Sclera clear, pupils equal  ENT: Moist mucus membranes, no thrush. Trachea midline.  Cardiovascular: Regular rhythm, normal S1, S2. No murmur, gallop, rub. No edema in lower extremities  Respiratory: Clear to auscultation bilaterally, no wheeze, good inspiratory effort  Gastrointestinal: Abdomen soft, non-tender, not distended, normal bowel sounds  Musculoskeletal: No cyanosis in digits, neck supple  Neurology: Cranial nerves grossly intact. Confused due to post ictal state.  Psychiatry: Confused  Skin: Warm, dry, normal turgor, no rash    Labs:  CBC:   Lab Results   Component Value Date    WBC 15.8 02/13/2014    RBC 4.64 02/13/2014    HGB 14.0 02/13/2014    HCT 43.8 02/13/2014    MCV 94.3 02/13/2014    MCH 30.2 02/13/2014    MCHC 32.1 02/13/2014    RDW 14.4 02/13/2014    PLT  274 02/13/2014    MPV 7.9 02/13/2014     BMP:    Lab Results   Component Value Date    NA 145 02/13/2014    K 3.6 02/13/2014    CL 101 02/13/2014    CO2 12 02/13/2014    BUN 8 02/13/2014    CREATININE 0.9 02/13/2014    CREATININE 1.0 08/17/2011    CALCIUM 10.0 02/13/2014    GFRAA >60 02/13/2014    GFRAA 157 08/19/2011    LABGLOM >60 02/13/2014    GLUCOSE 152 02/13/2014       Chest Xray:   The heart, lungs and pulmonary vessels are normal. No   free fluid is identified. 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 Cardiac monitor leads   are present along with oxygen tubing.     EKG:  NA  I visualized CXR images my self    Discussed the pt with Dr. Rande Lawman     Problem List  Active Problems:    Seizures (HCC)        Assessment/Plan:   Generalized Tonic clonic seizures due to non compliance with AEM  Hx of seizure disorder  Low levels of Dilantin and Keppra  Keppra IV, add Dilantin PO once pt is fully awake  Seizure precautions  Neurology  consult    Admit as inpatient. I anticipate hospitalization spanning at least two midnights for investigation and treatment of the above medically necessary diagnoses.    Anselm Lis, MD    02/14/2014 12:27 AM

## 2014-02-14 NOTE — Care Coordination-Inpatient (Signed)
Patient was referred to the financial counselor, and he has [facility] community plan.

## 2014-02-14 NOTE — ED Provider Notes (Signed)
HPI       [place] [facility]    Pt Name: Roy Weaver  MRN: [id number]  [place] [birth year]  Date of evaluation: 02/13/2014  Provider: Danielle Dess, PA  Attending provider: Doyne Keel, MD    PCP: Garnetta Buddy, MD    Chief Complaint   Patient presents with   ??? Seizures     had witnessed 3-5 minutes seizure at [facility].  Hx of same.  states he hasn't had a seizure in months.  very tired, but alert to person, place, date, situation.       Nursing Notes, Past Medical Hx, Past Surgical Hx, Social Hx, Allergies, and Family Hx were all reviewed and agreed with or any disagreements were addressed in the HPI.    HPI:  (Location, Duration, Timing, Severity, Quality, Assoc Sx, Context, Modifying factors)  This is a  28 y.o. male who presents to the emergency department after having a witnessed 3-5 minute seizure while at [employer].  Patient has a history of seizures.  He has not had a seizure in several months.  Patient was very tired but states he became alert to person place and time right after this occurred.  He denies having any pain currently.  Patient states that he has not had anything to eat since yesterday and normally he will have a seizure because of this.  Patient states that he does not want any further workup.  Patient denies any recent illnesses, fevers or chills.  Denies any headache, lightheadedness or dizziness.  Denies any history of cardiac disease.  Patient has been compliant with his medications.    Past Medical/Surgical History:      Diagnosis Date   ??? Seizures ([place])    ??? Migraine      History reviewed. No pertinent past surgical history.    Medications:  No current facility-administered medications on file prior to encounter.     Current Outpatient Prescriptions on File Prior to Encounter   Medication Sig Dispense Refill   ??? levETIRAcetam (KEPPRA) 500 MG tablet Take 1 tablet by mouth 2 times daily 60 tablet 0   ??? phenytoin (DILANTIN) 100 MG ER capsule Take 3 capsules by mouth  twice a day (Patient taking differently: Take 200 mg by mouth 2 times daily Take 3 capsules by mouth twice a day) 180 capsule 2   ??? nicotine (NICODERM CQ) 21 MG/24HR Place 1 patch onto the skin every 24 hours. 30 patch 3         Review of Systems   Constitutional: Negative for fever.   Respiratory: Negative for shortness of breath.    Cardiovascular: Negative for chest pain.   Gastrointestinal: Negative for nausea, vomiting, abdominal pain, diarrhea and constipation.   Neurological: Positive for seizures.   All other systems reviewed and are negative.      Physical Exam   Constitutional: He is oriented to person, place, and time. He appears well-developed and well-nourished.   HENT:   Head: Normocephalic.   Right Ear: External ear normal.   Left Ear: External ear normal.   Nose: Nose normal.   Eyes: Conjunctivae and EOM are normal. Pupils are equal, round, and reactive to light. Right eye exhibits no discharge. Left eye exhibits no discharge.   Neck: Normal range of motion. Neck supple.   Cardiovascular: Normal rate, regular rhythm and normal heart sounds.  Exam reveals no gallop and no friction rub.    No murmur heard.  Pulmonary/Chest: Effort normal  and breath sounds normal. No respiratory distress. He has no wheezes. He has no rales.   Abdominal: Soft. Bowel sounds are normal.   Musculoskeletal: Normal range of motion.   Neurological: He is alert and oriented to person, place, and time. No cranial nerve deficit. Coordination normal.   Skin: Skin is warm and dry. He is not diaphoretic. No pallor.   Psychiatric: He has a normal mood and affect. His behavior is normal.   Nursing note and vitals reviewed.      Procedures    MDM  MEDICAL DECISION MAKING    Vitals:    Filed Vitals:    02/13/14 1810   BP: 131/82   Pulse: 97   Temp: 98.4 ??F (36.9 ??C)   TempSrc: Oral   Resp: 22   Height: 6' 4"$  (1.93 m)   Weight: 186 lb (84.369 kg)   SpO2: 99%       LABS:   Labs Reviewed   LEVETIRACETAM LEVEL   PHENYTOIN LEVEL, TOTAL       Remainder of labs reviewed and were negative at this time or not returned at the time of this note.    Orders:  Orders Placed This Encounter   Procedures   ??? Levetiracetam level   ??? Phenytoin level, total   ??? EKG 12 Lead   ??? Saline lock IV     MEDICAL DECISION MAKING / ED COURSE:    Patient was given the following medications:  Medications - No data to display  Patient presents to the emergency department after having seizures.  Patient refuses any IV blood work, EKG or medical treatment.  Patient is alert and oriented and with normal mental status, capable of making decisions.  Patient made aware of decision to leave against medical advice could result in death.  Patient made aware.  He signed AMA forms.    CLINICAL IMPRESSION:    1. Seizure disorder ([place])    2. Left against medical advice        DISPOSITION AMA    PATIENT REFERRED TO:  No follow-up provider specified.    DISCHARGE MEDICATIONS:  Discharge Medication List as of 02/13/2014  8:27 PM          DISCONTINUED MEDICATIONS:  Discharge Medication List as of 02/13/2014  8:27 PM            (Please note the MDM and HPI sections of this note were completed with a voice recognition program.  Efforts were made to edit the dictations but occasionally words are mis-transcribed.)    Osborne Serio Wannetta Sender, PA        Gladys Gutman N Sufyaan Palma, PA  02/14/14 0320    Memori Sammon N Niara Bunker, PA  02/14/14 0321

## 2014-02-14 NOTE — Procedures (Signed)
PATIENT NAME:                   PA #:             MR #Roy Weaver, Roy Weaver                  [id number]        [id number]         ATTENDING PHYSICIAN:                    SERVICE DATE:  DIS DATE:       Trudi Ida, MD           02/14/2014                     DATE OF BIRTH:     AGE:            PATIENT TYPE:      RM #:           [birth year]         27              IPF                [id number]               The EEG was performed on a 16-channel machine using the international 10-20  hookup.     The background rhythm in this recording consists of well-developed and  well-organized wave forms of 8-10 Hz frequency, which are bilaterally  synchronous and symmetrical.  This was a limited study since there was a lot  of movement and muscle artifacts in this recording.  No focal abnormalities,  sharp waves or spikes were seen in this recording.  Hyperventilation was not  performed.  Photic stimulation did not produce any photic drive.  A sleep  recording was normal.     IMPRESSION:  This limited electroencephalogram obtained during the awake  state and during sleep was within normal limits.   There was a lot of muscle  and movement artifact in this recording.                                            Junius Roads, MD     [id number]  DD: 02/14/2014 11:58   DT: 02/14/2014 12:08   Job #: [id number]  CC:

## 2014-02-15 LAB — VALPROIC ACID LEVEL, TOTAL: Valproic Acid Lvl: 35.1 ug/mL — ABNORMAL LOW (ref 50.0–100.0)

## 2014-02-15 LAB — LEVETIRACETAM LEVEL: Levetiracetam Lvl: 7.6 ug/mL (ref 6.0–46.0)

## 2014-02-15 MED ORDER — LEVETIRACETAM 750 MG PO TABS
750 MG | ORAL_TABLET | Freq: Two times a day (BID) | ORAL | Status: DC
Start: 2014-02-15 — End: 2014-03-31

## 2014-02-15 MED ORDER — DIVALPROEX SODIUM ER 500 MG PO TB24
500 MG | ORAL_TABLET | Freq: Every day | ORAL | Status: DC
Start: 2014-02-15 — End: 2014-03-31

## 2014-02-15 MED ADMIN — sodium chloride flush 0.9 % injection 10 mL: 10 mL | INTRAVENOUS | @ 01:00:00

## 2014-02-15 MED FILL — LEVETIRACETAM 500 MG PO TABS: 500 MG | ORAL | Qty: 2

## 2014-02-15 MED FILL — NORMAL SALINE FLUSH 0.9 % IV SOLN: 0.9 % | INTRAVENOUS | Qty: 10

## 2014-02-15 MED FILL — DEPAKOTE ER 500 MG PO TB24: 500 MG | ORAL | Qty: 2

## 2014-02-15 NOTE — Progress Notes (Signed)
Spoke with patient regarding discharge. He is to be discharged after seen by Dr Glean Hess. Patient verbalizes understanding.

## 2014-02-15 NOTE — Progress Notes (Signed)
Patient's friend is here and is his ride home. Patient and friend are upset that Dr Glean Hess will not be here until after 5. Patient does not want to wait. Patient and friend state that he was told he did not have to stay last night if he did not want to, but he agreed to stay. He does not understand why he needs to stay. Will call Dr Jocelyn Lamer.

## 2014-02-15 NOTE — Progress Notes (Signed)
Data- discharge order received, pt verbalized agreement to discharge, disposition to previous residence, no needs for HHC/DME.     Action- discharge instructions prepared and given to patient, pt verbalized understanding. Medication information packet given r/t NEW and/or CHANGED prescriptions emphasizing name/purpose/side effects, pt verbalized understanding. Discharge instruction summary: Diet- General, Activity- as tolerated, Primary Care Physician as follows: NISAR FATIMA HAQ, MD None f/u appointment as needed for problems. Schedule f/u appointment with Dr Glean Hess for 1-2 weeks. Contact information given. Patient has seen Glean Hess in the past and has a future appointment in November. Immunizations reviewed and none needed at this time, prescriptions given for Keppra and Depakote alng with instructions for next dose of each.    Response- Pt belongings gathered, IV removed. Disposition is home (no HHC/DME needs), transported with friend, taken to lobby by RN ambulatory, no complications. Friend picked up patient at front entrance. Patient inquired about return to work. Anxious to leave. Will check with Dr Jocelyn Lamer and call patient with response.

## 2014-02-15 NOTE — Progress Notes (Signed)
Checked with Dr Jocelyn Lamer regarding return to work for Roy Weaver. Dr Jocelyn Lamer wants patient to return to work on Monday, 02/18/2014. Called both phone numbers in chart. Mobile(M) number is no longer in service. Called home(H) number and left message for him to call [employer] with phone number.

## 2014-02-15 NOTE — Progress Notes (Signed)
Patient does not want to wait for Dr Glean Hess. Wants to leave. Will check with Dr Glean Hess. Called office and left message. Dr Jocelyn Lamer has prepared prescriptions for Keppra and Depakote.

## 2014-02-15 NOTE — Progress Notes (Signed)
Dr Jocelyn Lamer said patient may leave. She will give orders for follow up lab work.

## 2014-02-15 NOTE — Discharge Instructions (Signed)
Your information:  Name: Roy Weaver  DOB: 05-08-1986    Your Discharge Instructions    What to do after you leave the hospital:    Read, review and familiarize yourself with the information provided below and in a separate packet:    Recommended activity: As tolerated  Avoid strenuous activity until instructed by your physician to resume; balance rest with periods of light to normal activity.     If you experience any of the following: Unusual or inadequately controlled pain; unusual transient shortness of breath; recurrent or persistent nausea, heartburn, palpitations or lightheadedness; increased swelling; increased fatigue; fever >100; please follow up with NISAR Karmen Stabs, MD None or go to the Emergency Room.      Home Health/ Outpatient Services: follow-up labwork     Follow-up appointment: Make appointment with Dr. Glean Hess (neurologist) for 1-2 weeks [phone]      Information obtained by:  By signing below, I understand and acknowledge receipt of the instructions indicated above, and I understand that if any problems occur once I leave the hospital I am to contact NISAR Karmen Stabs, MD.

## 2014-02-15 NOTE — Plan of Care (Signed)
Problem: PAIN  Goal: Patient???s pain/discomfort is manageable  Outcome: Ongoing  Intervention: ASSESS PAIN LEVEL  Patient denies any pain at this time.

## 2014-02-15 NOTE — Progress Notes (Signed)
Dr Vidal Schwalbe office returned call. Dr Glean Hess wants to see patient before he is discharged, but will be after 5. Patient informed.

## 2014-02-15 NOTE — Plan of Care (Signed)
Problem: Falls - Risk of  Goal: Absence of falls  Outcome: Met This Shift  Safety maintained. No falls this shift.

## 2014-02-15 NOTE — Discharge Summary (Signed)
[facility] DISCHARGE SUMMARY    Patient Demographics    Patient: Roy Weaver  Date of Birth:  [birth year]  MRN:  [id number]   Code: Full Code    Primary care provider. NISAR FATIMA HAQ, MD  (Tel: None)    Admit date: 02/14/2014    Discharge date (blank if same as Note Date):   Note Date: 02/15/2014     Reason for Hospitalization.   Chief Complaint   Patient presents with   ??? Seizures     witnessed seizure- the 2nd today was here earlier and left AMA.          Significant Findings.   Active Problems:    Seizures (HCC)    Localization-related (focal) (partial) epilepsy and epileptic syndromes with complex partial seizures, with intractable epilepsy (HCC)    Noncompliance with medication regimen       Problems and results from this hospitalization that need follow up.  1. None     Significant test results and incidental findings.  AP PORTABLE CHEST. Feb 13, 2014 10:31:35 PM      INDICATION: seizure      COMPARISON: 09/13/2013      FINDINGS: The heart, lungs and pulmonary vessels are normal. No   free fluid is identified. 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 Cardiac monitor leads   are present along with oxygen tubing.         Impression   IMPRESSION: Stable chest. No evidence of acute cardiac or   pulmonary disease.            Invasive procedures and treatments.   1. None     Problem-based Hospital Course.  Roy Weaver is a 28 y.o. male with a h/o seizures. Patient was admitted to the hospital after he had 3 generalized tonic-clonic seizures as an outpatient. Patient has been noncompliant with his medications.  He has been seen in the emergency room a few times with low antiepileptic medication levels  When he came to the emergency room both Dilantin and Keppra levels are very low  He was given a loading dose of IV Keppra and was admitted on observation. When he became alert, he reported intolerable s/e to dilantin which causes him not to take the medicine. This  was stopped by the neurologist. Depakote was added and keppra dose increased. He is stable for dc    Consults.  IP CONSULT TO HOSPITALIST  IP CONSULT TO NEUROLOGY  IP CONSULT TO SOCIAL WORK    Physical examination on discharge day.   BP 108/64 mmHg   Pulse 82   Temp(Src) 98.3 ??F (36.8 ??C) (Temporal)   Resp 14   Ht  (1.88 m)   Wt 167 lb 8 oz (75.978 kg)   BMI 21.50 kg/m2   SpO2 99%  General appearance.  Alert. Looks comfortable.  HEENT. Sclera clear. Moist mucus membranes.  Cardiovascular. Regular rate and rhythm, normal S1, S2. No murmur.   Respiratory. Not using accessory muscles.Clear to auscultation bilaterally, no wheeze.  Gastrointestinal. Abdomen soft, non-tender, not distended, normal bowel sounds  Neurology. Facial symmetry. No speech deficits. Moving all extremities equally.  Extremities. No edema in lower extremities.  Skin. Warm, dry, normal turgor    Medication instructions provided to patient at discharge.     Medication List      START taking these medications          divalproex 500 MG ER tablet  Commonly known as:  DEPAKOTE ER   Take 2 tablets by mouth daily         CHANGE how you take these medications          levETIRAcetam 750 MG tablet   Commonly known as:  KEPPRA   Take 1 tablet by mouth 2 times daily   What changed:    - medication strength  - how much to take         CONTINUE taking these medications          nicotine 21 MG/24HR   Commonly known as:  NICODERM CQ   Place 1 patch onto the skin every 24 hours.         STOP taking these medications          phenytoin 100 MG ER capsule   Commonly known as:  DILANTIN         Where to Get Your Medications     These are the prescriptions that you need to pick up.         You may get the following medications from any pharmacy   -  divalproex 500 MG ER tablet   -  levETIRAcetam 750 MG tablet                      Discharge recommendations given to patient.  Follow Up. neurologist in 1 week   Disposition.  home  Activity. As tolerated  Diet: DIET  GENERAL;      Code status:  Full Code     Spent 31 minutes in discharge process.    Signed:  Lolita Lenz, MD     02/15/2014 8:33 AM

## 2014-03-07 ENCOUNTER — Inpatient Hospital Stay: Admit: 2014-03-07 | Discharge: 2014-03-07 | Attending: Emergency Medicine

## 2014-03-07 LAB — VALPROIC ACID LEVEL, TOTAL: Valproic Acid Lvl: <2.8 ug/mL — ABNORMAL LOW (ref 50.0–100.0)

## 2014-03-07 MED ORDER — LORAZEPAM 2 MG/ML IJ SOLN
2 MG/ML | Freq: Once | INTRAMUSCULAR | Status: AC
Start: 2014-03-07 — End: 2014-03-07
  Administered 2014-03-07: 21:00:00 1 mg via INTRAVENOUS

## 2014-03-07 MED ORDER — LORAZEPAM 0.5 MG PO TABS
0.5 MG | ORAL_TABLET | Freq: Three times a day (TID) | ORAL | Status: AC
Start: 2014-03-07 — End: 2014-03-10

## 2014-03-07 MED ORDER — SODIUM CHLORIDE 0.9 % IV BOLUS
0.9 % | Freq: Once | INTRAVENOUS | Status: AC
Start: 2014-03-07 — End: 2014-03-07
  Administered 2014-03-07: 21:00:00 1000 mL via INTRAVENOUS

## 2014-03-07 MED FILL — SODIUM CHLORIDE 0.9 % IV SOLN: 0.9 % | INTRAVENOUS | Qty: 1000

## 2014-03-07 MED FILL — LORAZEPAM 2 MG/ML IJ SOLN: 2 MG/ML | INTRAMUSCULAR | Qty: 1

## 2014-03-07 NOTE — ED Provider Notes (Signed)
[facility]  eMERGENCY dEPARTMENT eNCOUnter        Pt Name: Roy Weaver Rodda  MRN: [id number]  Birthdate January 27, 1986  Date of evaluation: 03/07/2014  Provider: Cardell Peachatherine K Esperansa Sarabia, NP  PCP: [name], MD  ED Attending: Herminio CommonsJustin D Coomes, MD    CHIEF COMPLAINT       Chief Complaint   Patient presents with   ??? Seizures     Pt. girlfriend states that pt. had a seizure the lasted about 1 and 1/2 min.       HISTORY OF PRESENT ILLNESS   (Location/Symptom, Timing/Onset, Context/Setting, Quality, Duration, Modifying Factors, Severity)  Note limiting factors.     Roy Weaver Leever is a 28 y.o. male  who presents after experiencing a seizure that lasted about a minute and half earlier today.  The patient states that he did not take his Depakote this morning as he is supposed to and he often has seizures when he is noncompliant with his medical condition regimen.  He did fall.  He did not hit his head.  He denies LOC.  He describes that his memory is completely intact during the convulsion.  He is otherwise healthy. He denies fever, headaches, chest pain, shortness of breath, cough, congestion, abdominal pain, painful urination, nausea, vomiting, constipation, diarrhea, or blood in the stool.  The patient feels well now and is neurologically intact.  He has taken his Depakote since the seizure.    No family at bedside.    Nursing Notes were all reviewed and agreed with or any disagreements were addressed  in the HPI.    REVIEW OF SYSTEMS    (2-9 systems for level 4, 10 or more for level 5)     Review of Systems   Constitutional: Negative for fever and chills.   HENT: Negative for postnasal drip, rhinorrhea and sore throat.    Eyes: Negative for visual disturbance.   Respiratory: Negative for shortness of breath.    Cardiovascular: Negative for chest pain.   Gastrointestinal: Negative for nausea, vomiting and abdominal pain.   Genitourinary: Negative for dysuria.   Neurological: Positive for seizures.  Negative for weakness and headaches.   All other systems reviewed and are negative.      Except as noted above in the ROS, all other systems were reviewed and negative.       PAST MEDICAL HISTORY     Past Medical History   Diagnosis Date   ??? Seizures (HCC)    ??? Migraine          SURGICAL HISTORY     History reviewed. No pertinent past surgical history.      CURRENT MEDICATIONS       Discharge Medication List as of 03/07/2014  6:26 PM      CONTINUE these medications which have NOT CHANGED    Details   divalproex (DEPAKOTE ER) 500 MG ER tablet Take 2 tablets by mouth daily, Disp-30 tablet, R-3      levETIRAcetam (KEPPRA) 750 MG tablet Take 1 tablet by mouth 2 times daily, Disp-60 tablet, R-3      nicotine (NICODERM CQ) 21 MG/24HR Place 1 patch onto the skin every 24 hours., Disp-30 patch, R-3               ALLERGIES     Review of patient's allergies indicates no known allergies.    FAMILY HISTORY       Family History   Problem Relation Age of  Onset   ??? Diabetes Father           SOCIAL HISTORY       History     Social History   ??? Marital Status: Single     Spouse Name: N/A     Number of Children: N/A   ??? Years of Education: N/A     Social History Main Topics   ??? Smoking status: Current Every Day Smoker -- 0.50 packs/day for 2 years     Types: Cigarettes   ??? Smokeless tobacco: Former Neurosurgeon     Quit date: 10/04/2013   ??? Alcohol Use: 0.0 oz/week     0 Not specified per week   ??? Drug Use: No      Comment: stopped smoking marijuana    ??? Sexual Activity: None     Other Topics Concern   ??? None     Social History Narrative    ** Merged History Encounter **            SCREENINGS             PHYSICAL EXAM    (up to 7 for level 4, 8 or more for level 5)   ED Triage Vitals   BP Temp Temp Source Pulse Resp SpO2 Height Weight   03/07/14 1545 03/07/14 1545 03/07/14 1545 03/07/14 1545 03/07/14 1545 03/07/14 1545 03/07/14 1545 03/07/14 1545   155/75 mmHg 97.8 ??F (36.6 ??C) Oral 111 16 97 % 6\' 4"  (1.93 m) 185 lb (83.915 kg)       Physical  Exam   Constitutional: He is oriented to person, place, and time. He appears well-developed and well-nourished. No distress.   HENT:   Head: Normocephalic and atraumatic.   Eyes: Right eye exhibits no discharge. Left eye exhibits no discharge.   Neck: Normal range of motion.   Pulmonary/Chest: Effort normal. No stridor. No respiratory distress.   Musculoskeletal: Normal range of motion.   Neurological: He is alert and oriented to person, place, and time. He has normal strength and normal reflexes. No cranial nerve deficit or sensory deficit. He displays a negative Romberg sign. GCS eye subscore is 4. GCS verbal subscore is 5. GCS motor subscore is 6.   Skin: Skin is warm and dry. He is not diaphoretic. No pallor.   Psychiatric: He has a normal mood and affect. His behavior is normal.   Nursing note and vitals reviewed.      DIAGNOSTIC RESULTS   LABS:    Labs Reviewed   VALPROIC ACID LEVEL, TOTAL - Abnormal; Notable for the following:     Valproic Acid Lvl <2.8 (*)     All other components within normal limits       All other labs were within normal range or not returned as of this dictation.    EKG: All EKG's are interpreted by the Emergency Department Physician who either signs or Co-signs this chart in the absence of a cardiologist.        RADIOLOGY:   Non-plain film images such as CT, Ultrasound and MRI are read by the radiologist. Plain radiographic images are visualized and preliminarily interpreted by the  ED Provider with the below findings:      Interpretation per the Radiologist below, if available at the time of this note:       Xr Chest Portable    02/13/2014   AP PORTABLE CHEST. Feb 13, 2014 10:31:35 PM   INDICATION: seizure   COMPARISON: 09/13/2013  FINDINGS: The heart, lungs and pulmonary vessels are normal. No free fluid is identified. 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 Cardiac monitor leads are present along with oxygen tubing.      02/13/2014   IMPRESSION: Stable  chest. No evidence of acute cardiac or pulmonary disease.      PROCEDURES   Unless otherwise noted below, none     Procedures    CRITICAL CARE TIME   N/A    CONSULTS:  None      EMERGENCY DEPARTMENT COURSE and DIFFERENTIAL DIAGNOSIS/MDM:   Vitals:    Filed Vitals:    03/07/14 1700 03/07/14 1721 03/07/14 1741 03/07/14 1825   BP: 119/68 120/68 115/66 136/72   Pulse: 97  92 89   Temp:       TempSrc:       Resp: 16 16 16 16    Height:       Weight:       SpO2: 97% 97% 98% 99%       Patient was given the following medications:  Medications   0.9 % sodium chloride bolus (0 mLs Intravenous Stopped 03/07/14 1825)   LORazepam (ATIVAN) injection 1 mg (1 mg Intravenous Given 03/07/14 1709)       The patient is seen for a seizure related to noncompliance with seizure medications. The patient is stable while in the ED and safe to follow as an outpatient.  The patient is discharged on Ativan for 3 days and instructed to follow up with his PCP or neurologist for monitoring his Depakote levels.  The patient is instructed to return to emergency department for any additional seizure activity, fever, or headache. This was discussed with the patient and my attending, Dr. Thomasene Lot, and both are in agreement with the plan.  Please see attending note.      The patient tolerated their visit well.  They were seen and evaluated by the attending physician, Herminio Commons, MD who agreed with the assessment and plan.  The patient and / or the family were informed of the results of any tests, a time was given to answer questions, a plan was proposed and they agreed with plan.        FINAL IMPRESSION      1. Witnessed seizure (HCC)    2. Noncompliance with medications          DISPOSITION/PLAN   DISPOSITION Decision to Discharge    PATIENT REFERRED TO:  Baldwin Jamaica, MD  [address]  [place] [address]    Schedule an appointment as soon as possible for a visit in 2 days  For a recheck in  1-2  days      DISCHARGE MEDICATIONS:  Discharge  Medication List as of 03/07/2014  6:26 PM      START taking these medications    Details   LORazepam (ATIVAN) 0.5 MG tablet Take 1 tablet by mouth 3 times daily for 3 days, Disp-9 tablet, R-0             DISCONTINUED MEDICATIONS:  Discharge Medication List as of 03/07/2014  6:26 PM                 (Please note that portions of this note were completed with a voice recognition program.  Efforts were made to edit the dictations but occasionally words are mis-transcribed.)    Cardell Peach, NP (electronically signed)  Cardell Peachatherine K Norma Ignasiak, NP  03/07/14 2030

## 2014-03-07 NOTE — Discharge Instructions (Signed)
Seizure: Care Instructions  Your Care Instructions     Seizures are caused by abnormal patterns of electrical signals in the brain. They are different for each person.  Seizures can affect movement, speech, vision, or awareness. Some people have only slight shaking of a hand and do not pass out. Other people may pass out and have violent shaking of the whole body. Some people appear to stare into space. They are awake, but they can't respond normally. Later, they may not remember what happened.  You may need tests to identify the type and cause of the seizures.  A seizure may occur only once, or you may have them more than one time. Taking medicines as directed and following up with your doctor may help keep you from having more seizures.  The doctor has checked you carefully, but problems can develop later. If you notice any problems or new symptoms, get medical treatment right away.  Follow-up care is a key part of your treatment and safety. Be sure to make and go to all appointments, and call your doctor if you are having problems. It's also a good idea to know your test results and keep a list of the medicines you take.  How can you care for yourself at home?   Be safe with medicines. Take your medicines exactly as prescribed. Call your doctor if you think you are having a problem with your medicine.   Do not do any activity that could be dangerous to you or others until your doctor says it is safe to do so. For example, do not drive a car, operate machinery, swim, or climb ladders.   Be sure that anyone treating you for any health problem knows that you have had a seizure and what medicines you are taking for it.   Identify and avoid things that may make you more likely to have a seizure. These may include lack of sleep, alcohol or drug use, stress, or not eating.   Make sure you go to your follow-up appointment.  When should you call for help?  Call 911 anytime you think you may need emergency care. For  example, call if:   You have another seizure.   You have more than one seizure in 24 hours.   You have new symptoms, such as trouble walking, speaking, or thinking clearly.  Call your doctor now or seek immediate medical care if:   You are not acting normally.  Watch closely for changes in your health, and be sure to contact your doctor if you have any problems.   Where can you learn more?   Go to [email or web address] and sign in to your MyChart account. Enter M769 in the Search Health Information box to learn more about "Seizure: Care Instructions."    If you do not have an account, please click on the "Sign Up Now" link.      2006-2015 [employer]. Care instructions adapted under license by [facility]. This care instruction is for use with your licensed healthcare professional. If you have questions about a medical condition or this instruction, always ask your healthcare professional. Health[employer], Incorporated disclaims any warranty or liability for your use of this information.  Content Version: 10.6.465758; Current as of: July 13, 2013

## 2014-03-07 NOTE — ED Notes (Signed)
Pt. Placed on cardiac monitor ST@ 107. Seizure pads in place. Pt. States that he didn't take his medicines today. A&O @ arrival.    Charlie PitterBryan Seth Tameah Mihalko, RN  03/07/14 [id number]

## 2014-03-07 NOTE — ED Provider Notes (Signed)
I independently performed a history and physical on Roy Weaver.   All diagnostic, treatment, and disposition decisions were made by myself in conjunction with the advanced practice provider.     Briefly, this is a 28 y.o. male here for seizures.  The patient had a seizure just prior to arrival.  He states he did not take his Depakote today due to scheduling interview.  The seizure was generalized tonic clonic.  He feels fine now and has no complaint..  Exam showed the patient to have mild sinus tachycardia.  He otherwise appeared well and nontoxic.  He was no acute distress.  His mental status is normal and he is alert.    For further details of Roy Weaver's emergency department encounter, please see documentation by advanced practice provider  Helane RimaKelly Brinkman, NP.        Herminio CommonsJustin D Lorey Pallett, MD  03/07/14 1725

## 2014-03-27 ENCOUNTER — Encounter: Attending: Clinical Neurophysiology | Primary: Internal Medicine

## 2014-03-29 ENCOUNTER — Inpatient Hospital Stay
Admit: 2014-03-29 | Disposition: A | Discharge status: Home / Self Care | Payer: PRIVATE HEALTH INSURANCE | Source: Home / Self Care | Admitting: Internal Medicine

## 2014-03-29 ENCOUNTER — Encounter: Admit: 2014-03-29 | Primary: Internal Medicine

## 2014-03-29 LAB — BLOOD GAS, ARTERIAL
Base Excess, Arterial: -0.5 mmol/L (ref <=3.0)
Carboxyhgb, Arterial: 2.5 % — ABNORMAL HIGH (ref 0.0–1.5)
HCO3, Arterial: 25.4 mmol/L (ref 21.0–29.0)
Hemoglobin, Art, Extended: 13.3 g/dL — ABNORMAL LOW (ref 13.5–17.5)
Methemoglobin, Arterial: 0.5 % (ref <1.5)
O2 Content, Arterial: 19 mL/dL
O2 Sat, Arterial: 99.8 % (ref >92)
TCO2, Arterial: 26.8 mmol/L
pCO2, Arterial: 46.4 mmHg — ABNORMAL HIGH (ref 35.0–45.0)
pH, Arterial: 7.356 (ref 7.350–7.450)
pO2, Arterial: 296.3 mmHg — ABNORMAL HIGH (ref 75.0–108.0)

## 2014-03-29 LAB — MICROSCOPIC URINALYSIS
Epithelial Cells, UA: 4 /HPF (ref 0–5)
Hyaline Casts, UA: 13 /HPF — ABNORMAL HIGH (ref 0–8)
RBC, UA: 20 /HPF — ABNORMAL HIGH (ref 0–4)
WBC, UA: 4 /HPF (ref 0–5)

## 2014-03-29 LAB — URINALYSIS
Bilirubin Urine: NEGATIVE
Glucose, Ur: NEGATIVE mg/dL
Ketones, Urine: NEGATIVE mg/dL
Leukocyte Esterase, Urine: NEGATIVE
Nitrite, Urine: NEGATIVE
Protein, UA: 100 mg/dL — AB
Specific Gravity, UA: 1.013 (ref 1.005–1.030)
Urobilinogen, Urine: 0.2 E.U./dL (ref <2.0)
pH, UA: 5 (ref 5.0–8.0)

## 2014-03-29 LAB — COMPREHENSIVE METABOLIC PANEL
ALT: 33 U/L (ref 10–40)
ALT: 28 U/L (ref 10–40)
AST: 40 U/L — ABNORMAL HIGH (ref 15–37)
AST: 34 U/L (ref 15–37)
Albumin/Globulin Ratio: 1.1 (ref 1.1–2.2)
Albumin/Globulin Ratio: 1.1 (ref 1.1–2.2)
Albumin: 4.5 g/dL (ref 3.4–5.0)
Albumin: 3.5 g/dL (ref 3.4–5.0)
Alkaline Phosphatase: 114 U/L (ref 40–129)
Alkaline Phosphatase: 83 U/L (ref 40–129)
Anion Gap: 30 — ABNORMAL HIGH (ref 3–16)
Anion Gap: 10 (ref 3–16)
BUN: 8 mg/dL (ref 7–20)
BUN: 7 mg/dL (ref 7–20)
CO2: 13 mmol/L — CL (ref 21–32)
CO2: 24 mmol/L (ref 21–32)
Calcium: 9.5 mg/dL (ref 8.3–10.6)
Calcium: 8.2 mg/dL — ABNORMAL LOW (ref 8.3–10.6)
Chloride: 105 mmol/L (ref 99–110)
Chloride: 109 mmol/L (ref 99–110)
Creatinine: 1 mg/dL (ref 0.9–1.3)
Creatinine: 1 mg/dL (ref 0.9–1.3)
GFR African American: >60 (ref >60)
GFR African American: >60 (ref >60)
GFR Non-African American: >60 (ref >60)
GFR Non-African American: >60 (ref >60)
Globulin: 4.1 g/dL
Globulin: 3.2 g/dL
Glucose: 128 mg/dL — ABNORMAL HIGH (ref 70–99)
Glucose: 98 mg/dL (ref 70–99)
Potassium: 3.7 mmol/L (ref 3.5–5.1)
Potassium: 4.5 mmol/L (ref 3.5–5.1)
Sodium: 148 mmol/L — ABNORMAL HIGH (ref 136–145)
Sodium: 143 mmol/L (ref 136–145)
Total Bilirubin: 0.5 mg/dL (ref 0.0–1.0)
Total Bilirubin: 0.5 mg/dL (ref 0.0–1.0)
Total Protein: 8.6 g/dL — ABNORMAL HIGH (ref 6.4–8.2)
Total Protein: 6.7 g/dL (ref 6.4–8.2)

## 2014-03-29 LAB — URINE DRUG SCREEN
Amphetamine Screen, Urine: NEGATIVE
Barbiturate Screen, Ur: NEGATIVE (ref <200)
Benzodiazepine Screen, Urine: NEGATIVE (ref <200)
Cannabinoid Scrn, Ur: POSITIVE — AB (ref <50)
Cocaine Metabolite Screen, Urine: NEGATIVE (ref <300)
Methadone Screen, Urine: NEGATIVE (ref <300)
Opiate Scrn, Ur: NEGATIVE (ref <300)
PCP Screen, Urine: NEGATIVE (ref <25)
Propoxyphene Scrn, Ur: NEGATIVE (ref <300)
pH, UA: 5

## 2014-03-29 LAB — CBC
Hematocrit: 47.6 % (ref 40.5–52.5)
Hemoglobin: 15.3 g/dL (ref 13.5–17.5)
MCH: 30.3 pg (ref 26.0–34.0)
MCHC: 32.2 g/dL (ref 31.0–36.0)
MCV: 94.1 fL (ref 80.0–100.0)
MPV: 7.7 fL (ref 5.0–10.5)
Platelets: 283 10*3/uL (ref 135–450)
RBC: 5.06 M/uL (ref 4.20–5.90)
RDW: 13.7 % (ref 12.4–15.4)
WBC: 14 10*3/uL — ABNORMAL HIGH (ref 4.0–11.0)

## 2014-03-29 LAB — PROTIME-INR
INR: 1.03 (ref 0.85–1.16)
Protime: 11.7 s (ref 9.8–13.0)

## 2014-03-29 LAB — LACTIC ACID
Lactic Acid: 19.4 mmol/L (ref 0.4–2.0)
Lactic Acid: 1.5 mmol/L (ref 0.4–2.0)
Lactic Acid: 1.4 mmol/L (ref 0.4–2.0)

## 2014-03-29 LAB — ETHANOL: Ethanol Lvl: NOT DETECTED mg/dL

## 2014-03-29 LAB — POCT GLUCOSE: POC Glucose: 119 mg/dl — ABNORMAL HIGH (ref 70–99)

## 2014-03-29 LAB — CK: Total CK: 360 U/L — ABNORMAL HIGH (ref 39–308)

## 2014-03-29 LAB — VALPROIC ACID LEVEL, TOTAL: Valproic Acid Lvl: <2.8 ug/mL — ABNORMAL LOW (ref 50.0–100.0)

## 2014-03-29 MED ORDER — DIAZEPAM 5 MG/ML IJ SOLN
5 MG/ML | INTRAMUSCULAR | Status: DC | PRN
Start: 2014-03-29 — End: 2014-03-31

## 2014-03-29 MED ORDER — PIPERACILLIN-TAZOBACTAM 3.375 G IN 50 ML (PREMIX) IVPB EXTENDED
Freq: Three times a day (TID) | INTRAVENOUS | Status: DC
Start: 2014-03-29 — End: 2014-03-31
  Administered 2014-03-29 – 2014-03-31 (×5): 3.375 g via INTRAVENOUS

## 2014-03-29 MED ORDER — ENOXAPARIN SODIUM 40 MG/0.4ML SC SOLN
40 MG/0.4ML | Freq: Every evening | SUBCUTANEOUS | Status: DC
Start: 2014-03-29 — End: 2014-03-31
  Administered 2014-03-30 – 2014-03-31 (×2): 40 mg via SUBCUTANEOUS

## 2014-03-29 MED ORDER — LEVETIRACETAM IN NACL 1000 MG/100ML IV SOLN
1000 MG/100ML | Freq: Two times a day (BID) | INTRAVENOUS | Status: DC
Start: 2014-03-29 — End: 2014-03-30
  Administered 2014-03-29 – 2014-03-30 (×2): 1000 mg via INTRAVENOUS

## 2014-03-29 MED ORDER — AZITHROMYCIN 500 MG IV SOLR
500 MG | Freq: Once | INTRAVENOUS | Status: DC
Start: 2014-03-29 — End: 2014-03-30

## 2014-03-29 MED ORDER — ONDANSETRON HCL 4 MG/2ML IJ SOLN
4 MG/2ML | INTRAMUSCULAR | Status: DC | PRN
Start: 2014-03-29 — End: 2014-03-31
  Administered 2014-03-29: 17:00:00 4 mg via INTRAVENOUS

## 2014-03-29 MED ORDER — MIDAZOLAM HCL 2 MG/2ML IJ SOLN
2 MG/ML | INTRAMUSCULAR | Status: DC
Start: 2014-03-29 — End: 2014-03-30

## 2014-03-29 MED ORDER — VANCOMYCIN HCL 1000 MG IV SOLR
1000 MG | Freq: Once | INTRAVENOUS | Status: DC
Start: 2014-03-29 — End: 2014-03-30

## 2014-03-29 MED ORDER — SODIUM CHLORIDE 0.9 % IV SOLN
0.9 % | INTRAVENOUS | Status: DC
Start: 2014-03-29 — End: 2014-03-30
  Administered 2014-03-30: 05:00:00 via INTRAVENOUS

## 2014-03-29 MED ORDER — PROPOFOL 10 MG/ML IV EMUL
10 MG/ML | INTRAVENOUS | Status: DC
Start: 2014-03-29 — End: 2014-03-29

## 2014-03-29 MED ORDER — SODIUM BICARBONATE 8.4 % IV SOLN
8.4 % | Freq: Once | INTRAVENOUS | Status: AC
Start: 2014-03-29 — End: 2014-03-29
  Administered 2014-03-29: 18:00:00 50 meq via INTRAVENOUS

## 2014-03-29 MED ORDER — MIDAZOLAM HCL 2 MG/2ML IJ SOLN
2 MG/ML | INTRAMUSCULAR | Status: AC
Start: 2014-03-29 — End: 2014-03-29
  Administered 2014-03-29: 20:00:00 6

## 2014-03-29 MED ORDER — LORAZEPAM 2 MG/ML IJ SOLN
2 MG/ML | Freq: Once | INTRAMUSCULAR | Status: AC
Start: 2014-03-29 — End: 2014-03-29
  Administered 2014-03-29: 17:00:00 2 mg via INTRAVENOUS

## 2014-03-29 MED ORDER — PIPERACILLIN-TAZOBACTAM IN DEX 3-0.375 GM/50ML IV SOLN
Freq: Once | INTRAVENOUS | Status: AC
Start: 2014-03-29 — End: 2014-03-29
  Administered 2014-03-29: 16:00:00 3.375 g via INTRAVENOUS

## 2014-03-29 MED ORDER — MIDAZOLAM HCL 2 MG/2ML IJ SOLN
2 MG/ML | INTRAMUSCULAR | Status: AC
Start: 2014-03-29 — End: 2014-03-29
  Administered 2014-03-29: 20:00:00 4

## 2014-03-29 MED ORDER — FOSPHENYTOIN SODIUM 100 MG PE/2ML IJ SOLN
1002 MG PE/2ML | Freq: Once | INTRAMUSCULAR | Status: AC
Start: 2014-03-29 — End: 2014-03-29
  Administered 2014-03-29: 17:00:00 1260 mg/kg via INTRAVENOUS

## 2014-03-29 MED ORDER — LORAZEPAM 2 MG/ML IJ SOLN
2 MG/ML | INTRAMUSCULAR | Status: DC | PRN
Start: 2014-03-29 — End: 2014-03-31

## 2014-03-29 MED ORDER — SODIUM CHLORIDE 0.9 % IV BOLUS
0.9 % | Freq: Once | INTRAVENOUS | Status: DC
Start: 2014-03-29 — End: 2014-03-30

## 2014-03-29 MED ORDER — NORMAL SALINE FLUSH 0.9 % IV SOLN
0.9 % | Freq: Two times a day (BID) | INTRAVENOUS | Status: DC
Start: 2014-03-29 — End: 2014-03-31
  Administered 2014-03-30 (×2): 10 mL via INTRAVENOUS

## 2014-03-29 MED ORDER — IPRATROPIUM-ALBUTEROL 0.5-2.5 (3) MG/3ML IN SOLN
Freq: Once | RESPIRATORY_TRACT | Status: AC
Start: 2014-03-29 — End: 2014-03-29
  Administered 2014-03-29: 18:00:00 1 via RESPIRATORY_TRACT

## 2014-03-29 MED ORDER — SODIUM CHLORIDE 0.9 % IV BOLUS
0.9 % | Freq: Once | INTRAVENOUS | Status: AC
Start: 2014-03-29 — End: 2014-03-29
  Administered 2014-03-29: 17:00:00 2000 mL via INTRAVENOUS

## 2014-03-29 MED ORDER — SODIUM CHLORIDE 0.9 % IV SOLN
0.9 % | INTRAVENOUS | Status: AC
Start: 2014-03-29 — End: 2014-03-29
  Administered 2014-03-29: 22:00:00 via INTRAVENOUS

## 2014-03-29 MED ORDER — ACETAMINOPHEN 325 MG PO TABS
325 MG | ORAL | Status: DC | PRN
Start: 2014-03-29 — End: 2014-03-31

## 2014-03-29 MED ORDER — SODIUM CHLORIDE 0.9 % IV SOLN
0.9 % | INTRAVENOUS | Status: DC
Start: 2014-03-29 — End: 2014-03-30

## 2014-03-29 MED ORDER — VALPROATE SODIUM 100 MG/ML IV SOLN
100 MG/ML | Freq: Three times a day (TID) | INTRAVENOUS | Status: DC
Start: 2014-03-29 — End: 2014-03-30
  Administered 2014-03-30 (×2): 500 mg via INTRAVENOUS

## 2014-03-29 MED ORDER — LORAZEPAM 2 MG/ML IJ SOLN
2 MG/ML | Freq: Once | INTRAMUSCULAR | Status: AC
Start: 2014-03-29 — End: 2014-03-29
  Administered 2014-03-29: 16:00:00 2 mg via INTRAVENOUS

## 2014-03-29 MED ORDER — PROPOFOL 10 MG/ML IV EMUL
10 MG/ML | INTRAVENOUS | Status: DC
Start: 2014-03-29 — End: 2014-03-30

## 2014-03-29 MED ORDER — AZITHROMYCIN 500 MG IV SOLR
500 MG | Freq: Once | INTRAVENOUS | Status: DC
Start: 2014-03-29 — End: 2014-03-29

## 2014-03-29 MED ORDER — PNEUMOCOCCAL VAC POLYVALENT 25 MCG/0.5ML IJ INJ
25 MCG/0.5ML | Freq: Once | INTRAMUSCULAR | Status: AC
Start: 2014-03-29 — End: 2014-03-30
  Administered 2014-03-30: 15:00:00 0.5 mL via INTRAMUSCULAR

## 2014-03-29 MED ORDER — ACETAMINOPHEN 650 MG RE SUPP
650 MG | RECTAL | Status: AC
Start: 2014-03-29 — End: 2014-03-29
  Administered 2014-03-29: 17:00:00 650 via RECTAL

## 2014-03-29 MED ORDER — NORMAL SALINE FLUSH 0.9 % IV SOLN
0.9 % | INTRAVENOUS | Status: DC | PRN
Start: 2014-03-29 — End: 2014-03-31

## 2014-03-29 MED ORDER — LORAZEPAM 2 MG/ML IJ SOLN
2 MG/ML | INTRAMUSCULAR | Status: AC
Start: 2014-03-29 — End: 2014-03-29

## 2014-03-29 MED ORDER — ACETAMINOPHEN 650 MG RE SUPP
650 MG | Freq: Once | RECTAL | Status: AC
Start: 2014-03-29 — End: 2014-03-29

## 2014-03-29 MED ORDER — SODIUM CHLORIDE 0.9 % IV BOLUS
0.9 % | Freq: Once | INTRAVENOUS | Status: AC
Start: 2014-03-29 — End: 2014-03-29
  Administered 2014-03-29: 22:00:00 1000 mL via INTRAVENOUS

## 2014-03-29 MED ORDER — INFLUENZA VAC TISS-CULT SUBUNT 0.5 ML IM SUSY
0.5 ML | Freq: Once | INTRAMUSCULAR | Status: AC
Start: 2014-03-29 — End: 2014-03-30
  Administered 2014-03-30: 15:00:00 0.5 mL via INTRAMUSCULAR

## 2014-03-29 MED ORDER — FENTANYL CITRATE 0.05 MG/ML IJ SOLN
0.05 MG/ML | INTRAMUSCULAR | Status: DC
Start: 2014-03-29 — End: 2014-03-29

## 2014-03-29 MED FILL — IPRATROPIUM-ALBUTEROL 0.5-2.5 (3) MG/3ML IN SOLN: RESPIRATORY_TRACT | Qty: 3

## 2014-03-29 MED FILL — FENTANYL CITRATE 0.05 MG/ML IJ SOLN: 0.05 MG/ML | INTRAMUSCULAR | Qty: 5

## 2014-03-29 MED FILL — ACETAMINOPHEN 650 MG RE SUPP: 650 MG | RECTAL | Qty: 1

## 2014-03-29 MED FILL — DEPACON 100 MG/ML IV SOLN: 100 MG/ML | INTRAVENOUS | Qty: 5

## 2014-03-29 MED FILL — SODIUM CHLORIDE 0.9 % IV SOLN: 0.9 % | INTRAVENOUS | Qty: 2000

## 2014-03-29 MED FILL — LORAZEPAM 2 MG/ML IJ SOLN: 2 MG/ML | INTRAMUSCULAR | Qty: 1

## 2014-03-29 MED FILL — LEVETIRACETAM IN NACL 1000 MG/100ML IV SOLN: 1000 MG/100ML | INTRAVENOUS | Qty: 100

## 2014-03-29 MED FILL — ONDANSETRON HCL 4 MG/2ML IJ SOLN: 4 MG/2ML | INTRAMUSCULAR | Qty: 2

## 2014-03-29 MED FILL — MIDAZOLAM HCL 2 MG/2ML IJ SOLN: 2 MG/ML | INTRAMUSCULAR | Qty: 4

## 2014-03-29 MED FILL — SODIUM BICARBONATE 8.4 % IV SOLN: 8.4 % | INTRAVENOUS | Qty: 50

## 2014-03-29 MED FILL — MIDAZOLAM HCL 2 MG/2ML IJ SOLN: 2 MG/ML | INTRAMUSCULAR | Qty: 2

## 2014-03-29 MED FILL — PROPOFOL 10 MG/ML IV EMUL: 10 MG/ML | INTRAVENOUS | Qty: 100

## 2014-03-29 MED FILL — ZOSYN 3-0.375 GM/50ML IV SOLN: INTRAVENOUS | Qty: 50

## 2014-03-29 MED FILL — SODIUM CHLORIDE 0.9 % IV SOLN: 0.9 % | INTRAVENOUS | Qty: 500

## 2014-03-29 MED FILL — AZITHROMYCIN 500 MG IV SOLR: 500 MG | INTRAVENOUS | Qty: 500

## 2014-03-29 MED FILL — FOSPHENYTOIN SODIUM 100 MG PE/2ML IJ SOLN: 100 MG PE/2ML | INTRAMUSCULAR | Qty: 25.2

## 2014-03-29 MED FILL — VANCOMYCIN HCL 1000 MG IV SOLR: 1000 MG | INTRAVENOUS | Qty: 1.25

## 2014-03-29 NOTE — Progress Notes (Signed)
Spoke with French Anaracy NP with pulmonology, updated on patient status and abgs/ lactic acid. No orders

## 2014-03-29 NOTE — ED Notes (Signed)
EEG bedside with pt.     Polo RileyMichelle Sheba Whaling, RN  03/29/14 1336

## 2014-03-29 NOTE — Procedures (Signed)
PATIENT NAME:                   PA #:             MR #Roy Weaver:              Roy Weaver, Roy Weaver                  [id number]        [id number]         ATTENDING PHYSICIAN:                    SERVICE DATE:  DIS DATE:       Lerry LinerGRETCHEN SUAREZ, MD                     03/29/2014                     DATE OF BIRTH:     AGE:            PATIENT TYPE:      RM #:           August 06, 1985         27              IPF                [id number]               The EEG was performed on a 16-channel machine using the international 10-20  hookup.     The background rhythm on this recording consists of moderately-developed and  moderately-organized wave forms of 9 to 11 Hz frequency, which are  bilaterally synchronous and symmetrical.  No focal abnormalities were noted.   No spikes or sharp waves were noted.  Hyperventilation was not performed.   Photic stimulation did not produce any photic drive.  Most of this recording  was obtained during sleep and showed normal physiological changes.     IMPRESSION:  This electroencephalogram recording obtained primarily during  sleep is within normal limits.                                                  Junius RoadsVIJAY R Providencia Hottenstein, MD     [id number]  DD: 03/29/2014 16:06   DT: 03/29/2014 21:47   Job #: [id number]  CC:

## 2014-03-29 NOTE — ED Notes (Signed)
Report called to Fannie KneeSue, RN; verbalized understanding.     Polo RileyMichelle Tameca Jerez, RN  03/29/14 1343

## 2014-03-29 NOTE — Other (Signed)
Patient Acct Nbr:  [id number]  Primary AUTH/CERT:    Primary Insurance Company Name:   [employer]/HMO  Primary Insurance Plan Name:  [facility] M/CAID HMO  Primary Insurance Group Number:  [id number]  Primary Insurance Plan Type: [employer]  Primary Insurance Policy Number:  [id number]

## 2014-03-29 NOTE — Progress Notes (Addendum)
Patient arrived from E.D.  Transferred to ICU Bed.  Patient placed on monitor.  NSR.  VSS.  Patient is flaccid.  Squeezes bilateral hands to commands.   Left anterior lobe coarse rhonchi.  Some rhonchi noted posterior lobes.  Red area on bridge nose and left lateral periorbital area.

## 2014-03-29 NOTE — Progress Notes (Signed)
Dr. Nelva BushSuarez at bedside assessing patient and place emergent central line.

## 2014-03-29 NOTE — Progress Notes (Signed)
Pt suction down the right nare. Sx moderate amount of yellow , thick secretions.

## 2014-03-29 NOTE — Progress Notes (Signed)
Attempted to place central line but not able due to pt uncooperative     Lerry LinerGretchen Amarisa Wilinski MD   03/29/2014  3:06 PM

## 2014-03-29 NOTE — H&P (Signed)
[facility] HISTORY AND PHYSICAL    03/29/2014 12:54 PM    Patient Information:  Roy GreeningRNEST J Weaver is a 28 y.o. male [phone]  PCP:  Roy JamaicaNISAR FATIMA HAQ, MD (Tel: None )    Chief complaint:    Chief Complaint   Patient presents with   ??? Seizures     PT to er reporting seizures last night and this morning.  Pt has seizure hx and hx of not taking medication.  MD at bedside.         History of Present Illness:  Per ER physician documentation:    'Roy Weaver is a 28 y.o. male who presents to the ED complaining of seizure. History is somewhat limited from the patient given his postictal/altered mental status. Apparently he had about 20 minute seizure at home prompting a 911 call. He was postictal and recovering when he had another seizure witnessed by EMS. He did not get any benzodiazepines by EMS and arrives with emesis over his face and hypoxia. He has a history per chart review of medication nonadherence although he is apparently supposed to be on Keppra and Depakote. History is limited by the patient's mental status so I cannot ask him about any recent drug or alcohol use or any other symptoms he may be having currently. He is moving all extremities. No known history of recent illnesses'    At the time of my exam pt has received benzodiazepines for seizures and is non-cooperative     History obtained from EMR    Old medical records show previous multiple admissions due to seizures related to non-compliance with meds      Patient unable to provide history/review of system due to mental status     REVIEW OF SYSTEMS: non-obtainable       Past Medical History:   has a past medical history of Seizures (HCC) and Migraine.     Past Surgical History:   has no past surgical history on file.     Medications:  No current facility-administered medications on file prior to encounter.     Current Outpatient Prescriptions on File Prior to Encounter    Medication Sig Dispense Refill   ??? divalproex (DEPAKOTE ER) 500 MG ER tablet Take 2 tablets by mouth daily 30 tablet 3   ??? levETIRAcetam (KEPPRA) 750 MG tablet Take 1 tablet by mouth 2 times daily 60 tablet 3   ??? nicotine (NICODERM CQ) 21 MG/24HR Place 1 patch onto the skin every 24 hours. 30 patch 3       Allergies:  No Known Allergies     Social History:   reports that he has been smoking Cigarettes.  He has a 1 pack-year smoking history. He quit smokeless tobacco use about 5 months ago. He reports that he drinks alcohol. He reports that he does not use illicit drugs.     Family History:  family history includes Diabetes in his father.   Physical Exam:  BP 128/46 mmHg   Pulse 102   Temp(Src) 102.4 ??F (39.1 ??C)   Resp 27   Wt 185 lb (83.915 kg)   SpO2 98%    General appearance:  Appears comfortable. Well nourished. Somnolent   Eyes: Sclera clear, pupils equal  ENT: Moist mucus membranes, no thrush. Trachea midline.  Cardiovascular: Regular rhythm, normal S1, S2. No murmur, gallop, rub. No edema in lower extremities  Respiratory: Clear to auscultation bilaterally, no wheeze, good inspiratory effort  Gastrointestinal: Abdomen soft, non-tender, not  distended, normal bowel sounds  Musculoskeletal: No cyanosis in digits, neck supple  Neurology:non-cooperative to exam   Skin: Warm, dry, normal turgor, no rash    Labs:  CBC:   Lab Results   Component Value Date    WBC 14.0 03/29/2014    RBC 5.06 03/29/2014    HGB 15.3 03/29/2014    HCT 47.6 03/29/2014    MCV 94.1 03/29/2014    MCH 30.3 03/29/2014    MCHC 32.2 03/29/2014    RDW 13.7 03/29/2014    PLT 283 03/29/2014    MPV 7.7 03/29/2014     BMP:    Lab Results   Component Value Date    NA 148 03/29/2014    K 3.7 03/29/2014    CL 105 03/29/2014    CO2 13 03/29/2014    BUN 8 03/29/2014    CREATININE 1.0 03/29/2014    CREATININE 1.0 08/17/2011    CALCIUM 9.5 03/29/2014    GFRAA >60 03/29/2014    GFRAA 157 08/19/2011    LABGLOM >60 03/29/2014    GLUCOSE 128 03/29/2014        Chest Xray: interpreted by me w RLL ASD suspected asp pneumonia     EKG:  Nsr. No ST changes     I visualized CXR images and EKG strips     Discussed POC with ED physician      Problem List    Seizures     Assessment/Plan:     Seizures due to non-compliance    Pt well known to be non-compliant with medicine or doctor's appts   Has been loaded with IV phosphenytoin and depacon    Neurologist consulted    Continue seizures precautions    Add PRN ativan for seizures    Aspiration pneumonia -- CXR c/w RLL ASD    Has been started on IVF resuscitation    Started on zosyn   Cultures obtained    Lactic acidosis due to tonic-clonic seizures -- POA -- resolved now       Admit as inpatient. I anticipate hospitalization spanning at least two midnights for investigation and treatment of the above medically necessary diagnoses.      Lerry LinerGretchen Pepe Mineau, MD    03/29/2014 12:54 PM

## 2014-03-29 NOTE — Progress Notes (Signed)
abg drawn rt brachial without complication on nrb and sent to lab

## 2014-03-29 NOTE — ED Notes (Signed)
Safety companion bedside with pt d/t pt pulling at restraints and lines and trying to get out of bed. Pt in bed with side rails up x 2, bed in low position. Seizure pads in place per protocol. Pt with heart monitor on and b/p set to cylce. Pt remains with non re-breather mask on, oxygen at 97%.   Will continue to monitor pt.     Polo RileyMichelle Clarice Zulauf, RN  03/29/14 1335

## 2014-03-29 NOTE — Plan of Care (Signed)
Problem: Falls - Risk of  Goal: Absence of falls  Outcome: Ongoing  Client remains free from falls, bed/chair alarm in place, door open, encouraged to use call light for needs, call light is within reach, bed lock in lowest position,  Will continue to monitor.

## 2014-03-29 NOTE — Consults (Signed)
NEUROLOGY CONSULTATION     Patient's Name :   Roy Weaver        Date of birth:   Jul 01, 1985                    Date of Consultation : 03/29/2014 5:29 PM    Referring Physician :  Lerry Liner, MD    Reason for Consultation :    Intractable partial complex seizures    HISTORY OF PRESENT ILLNESS :    Roy Weaver is a 28 y.o. male   History was obtained from the patient's nurse in the ICU and dictations in the chart  Patient is well-known to me.  Patient has chronic seizure disorder.  Patient has been noncompliant with medication.  He also did not show for his appointment in my office earlier this week.  Patient apparently had several seizures which lasted for about 5-10 minutes and was brought to the hospital.  Patient was postictal period  Patient was loaded with IV fosphenytoin.  His Depakote level was very low.  He was sent to the intensive care unit for further evaluation and treatment.  Patient is quite confused and agitated now.  Unable to give any history at this time.      REVIEW OF SYSTEMS  Unable to obtain due to neurological status  Past Medical History   Diagnosis Date   ??? Seizures (HCC)    ??? Migraine      Family History   Problem Relation Age of Onset   ??? Diabetes Father      History     Social History   ??? Marital Status: Single     Spouse Name: N/A     Number of Children: N/A   ??? Years of Education: N/A     Social History Main Topics   ??? Smoking status: Current Every Day Smoker -- 0.50 packs/day for 2 years     Types: Cigarettes   ??? Smokeless tobacco: Former Neurosurgeon     Quit date: 10/04/2013   ??? Alcohol Use: 0.0 oz/week     0 Not specified per week   ??? Drug Use: No      Comment: stopped smoking marijuana    ??? Sexual Activity: None     Other Topics Concern   ??? None     Social History Narrative    ** Merged History Encounter **          Current Facility-Administered Medications   Medication Dose Route Frequency Provider Last Rate Last  Dose   ??? ondansetron (ZOFRAN) injection 4 mg  4 mg Intravenous Q1H PRN Dalbert Batman, MD   4 mg at 03/29/14 1136   ??? vancomycin (VANCOCIN) 1,250 mg in dextrose 5 % 250 mL IVPB  15 mg/kg Intravenous Once Dalbert Batman, MD       ??? 0.9 % sodium chloride infusion   Intravenous Continuous Dalbert Batman, MD       ??? azithromycin (ZITHROMAX) 500 mg in dextrose 5 % 250 mL IVPB  500 mg Intravenous Once Dalbert Batman, MD       ??? 0.9 % sodium chloride bolus  1,000 mL Intravenous Once Dalbert Batman, MD       ??? sodium chloride flush 0.9 % injection 10 mL  10 mL Intravenous 2 times per day Lerry Liner, MD       ??? sodium chloride flush 0.9 % injection 10 mL  10 mL Intravenous PRN  Lerry LinerGretchen Suarez, MD       ??? acetaminophen (TYLENOL) tablet 650 mg  650 mg Oral Q4H PRN Lerry LinerGretchen Suarez, MD       ??? enoxaparin (LOVENOX) injection 40 mg  40 mg Subcutaneous Nightly Lerry LinerGretchen Suarez, MD       ??? 0.9 % sodium chloride bolus  30 mL/kg Intravenous Once Lerry LinerGretchen Suarez, MD       ??? LORazepam (ATIVAN) injection 2 mg  2 mg Intravenous Q4H PRN Lerry LinerGretchen Suarez, MD       ??? diazepam (VALIUM) injection 5 mg  5 mg Intravenous Q4H PRN Lerry LinerGretchen Suarez, MD       ??? piperacillin-tazobactam (ZOSYN) IVPB extended infusion 3.375 g  3.375 g Intravenous Q8H Lerry LinerGretchen Suarez, MD       ??? propofol 10 MG/ML injection            ??? midazolam (VERSED) 2 MG/2ML injection            ??? 0.9 % sodium chloride infusion   Intravenous Continuous Lerry LinerGretchen Suarez, MD 100 mL/hr at 03/29/14 1641     ??? 0.9 % sodium chloride bolus  1,000 mL Intravenous Once Lerry LinerGretchen Suarez, MD 1,000 mL/hr at 03/29/14 1640 1,000 mL at 03/29/14 1640     No Known Allergies    PHYSICAL EXAMINATION:  BP 116/69 mmHg   Pulse 93   Temp(Src) 98.8 ??F (37.1 ??C) (Temporal)   Resp 18   Ht 6' 3.98" (1.93 m)   Wt 178 lb 1.6 oz (80.786 kg)   BMI 21.69 kg/m2   SpO2 97%  Appearance: Well appearing, well nourished and in no distress  Mental Status Exam: Patient is drowsy and postictal at this  time.  When he is aroused, he becomes agitated.  Unable to participate in any mental status testing  Funduscopic Exam: Could not cooperate for testing  Cranial Nerves:   II: Visual fields:  Could not be tested  III: Pupils:  Equal round reacting to light  III,IV,VI: Extraocular movements are intact  V: Facial sensation could not be tested  VII: Face appears symmetrical  VIII: Hearing: Could not be tested  IX: Gag reflex was present  XI: Shoulder strength could not be tested  XII: Tongue is in the midline  Motor:  Patient moves all 4 extremities fairly well with a strength of at least 4 over 5.  Tone is normal.  Sensory: Patient would withdraw to pin prick  all over.  Coordination:  Could not be tested  Reflexes:  1 and symmetrical bilaterally  Plantar response: Flexor response bilaterally  Gait: Unable to ambulate  Romberg: Could not be tested  Vascular: No carotid bruit bilaterally        DATA:  LABS:  General Labs:    CBC:   Lab Results   Component Value Date    WBC 5.1 03/30/2014    RBC 4.09 03/30/2014    HGB 12.4 03/30/2014    HCT 37.2 03/30/2014    MCV 91.1 03/30/2014    MCH 30.3 03/30/2014    MCHC 33.3 03/30/2014    RDW 13.6 03/30/2014    PLT 179 03/30/2014    MPV 7.3 03/30/2014     BMP:    Lab Results   Component Value Date    NA 148 03/30/2014    K 4.2 03/30/2014    CL 110 03/30/2014    CO2 25 03/30/2014    BUN 9 03/30/2014    LABALBU 3.5 03/29/2014    CREATININE 1.0  03/30/2014    CREATININE 1.0 08/17/2011    CALCIUM 8.5 03/30/2014    GFRAA >60 03/30/2014    GFRAA 157 08/19/2011    LABGLOM >60 03/30/2014    GLUCOSE 91 03/30/2014       IMPRESSION :  Intractable partial complex seizures  Poor compliance with medication  Depakote level was very low  Patient now has encephalopathy from postictal state  Patient Active Problem List   Diagnosis   ??? Seizures (HCC)   ??? Migraine   ??? Recurrent seizures (HCC)   ??? History of nonadherence to medical treatment   ??? Delirium   ??? Drug use   ??? Partial epilepsy with impairment  of consciousness (HCC)   ??? Seizures (HCC)   ??? Partial epilepsy with impairment of consciousness, intractable (HCC)   ??? Noncompliance with medication regimen     RECOMMENDATIONS :  Discussed with the ICU nurses  Discussed with Dr. Nelva BushSuarez  EEG was reviewed and did not show any epileptic activity at this time  I will keep him on IV Keppra 1000 mg twice a day and IV Depakote 500 mg every 8 hours  Thank you for this consultation    Please note a portion of this chart was generated using dragon dictation software. Although every effort was made to ensure the accuracy of this automated transcription, some errors in transcription may have occurred.

## 2014-03-29 NOTE — Progress Notes (Addendum)
Patient states he sustained fall this week.  Placed fall bracelet on patient.  S/P seizure, placed padding on side rails. Removed bilateral soft wrist restraints since patient is just starting to arouse.

## 2014-03-29 NOTE — Progress Notes (Signed)
Bedside report received. Assessment completed and documented- see doc flow sheets. VSS, afebrile. Pt somnolent, awakes easily to voice. A&O x 4. Falls back to sleep easily. IV fluids infusing. Lungs clear/diminished. SaO2 on high 90's on 2L. NSR on monitor with T wave elevation. This is the patient's baseline per previous EKG's. Seizure precautions in place. Pt denies pain or needs. Instructed to call RN before getting up, call light in reach. Bed alarm on. Will monitor.

## 2014-03-29 NOTE — ED Provider Notes (Addendum)
[facility] Emergency Department    CHIEF COMPLAINT  Seizures      HISTORY OF PRESENT ILLNESS  Roy Weaver is a 28 y.o. male  who presents to the ED complaining of seizure.  History is somewhat limited from the patient given his postictal/altered mental status.  Apparently he had about 20 minute seizure at home prompting a 911 call.  He was postictal and recovering when he had another seizure witnessed by EMS.  He did not get any benzodiazepines by EMS and arrives with emesis over his face and hypoxia.  He has a history per chart review of medication nonadherence although he is apparently supposed to be on Keppra and Depakote.  History is limited by the patient's mental status so I cannot ask him about any recent drug or alcohol use or any other symptoms he may be having currently.  He is moving all extremities.  No known history of recent illnesses.    No other complaints, modifying factors or associated symptoms.     I have reviewed the following from the nursing documentation.    Past Medical History   Diagnosis Date   ??? Seizures (HCC)    ??? Migraine      No past surgical history on file.  Family History   Problem Relation Age of Onset   ??? Diabetes Father      History     Social History   ??? Marital Status: Single     Spouse Name: N/A     Number of Children: N/A   ??? Years of Education: N/A     Occupational History   ??? Not on file.     Social History Main Topics   ??? Smoking status: Current Every Day Smoker -- 0.50 packs/day for 2 years     Types: Cigarettes   ??? Smokeless tobacco: Former Neurosurgeon     Quit date: 10/04/2013   ??? Alcohol Use: 0.0 oz/week     0 Not specified per week   ??? Drug Use: No      Comment: stopped smoking marijuana    ??? Sexual Activity: Not on file     Other Topics Concern   ??? Not on file     Social History Narrative    ** Merged History Encounter **          Current Facility-Administered Medications   Medication Dose Route Frequency Provider Last Rate Last Dose   ??? 0.9 %  sodium chloride bolus  2,000 mL Intravenous Once Dalbert Batman, MD       ??? ondansetron [facility]) injection 4 mg  4 mg Intravenous Q1H PRN Dalbert Batman, MD         Current Outpatient Prescriptions   Medication Sig Dispense Refill   ??? divalproex (DEPAKOTE ER) 500 MG ER tablet Take 2 tablets by mouth daily 30 tablet 3   ??? levETIRAcetam (KEPPRA) 750 MG tablet Take 1 tablet by mouth 2 times daily 60 tablet 3   ??? nicotine (NICODERM CQ) 21 MG/24HR Place 1 patch onto the skin every 24 hours. 30 patch 3     No Known Allergies    REVIEW OF SYSTEMS  10 systems reviewed, pertinent positives per HPI otherwise noted to be negative.    PHYSICAL EXAM  BP 107/73 mmHg   Pulse 110   Temp(Src) 102.4 ??F (39.1 ??C)   Resp 25   Wt 185 lb (83.915 kg)   SpO2 99%  GENERAL APPEARANCE: Confused,  agitated, intermittently lethargic but protecting airway.  GCS:  Eyes: +3 (opens to speech)  Verbal: +3 (inappropriate words)  Motor: +5 (localizes pain)  Total: 11  HENT: Normocephalic. Atraumatic. Mucous membranes are dry and tacky.  Vomitus on face.  NECK: Supple.  No meningismus.  EYES: PERRL. EOM's grossly intact.  HEART/CHEST: Tachy, regular rhythm. No murmurs.  No chest wall tenderness.  LUNGS: Respirations mildly labored, impressive scattered rhonchi throughout all lung fields with no significant wheezing.  Hypoxic on room air.  Shallow rapid breathing although protecting his airway.  ABDOMEN: No tenderness. Soft. Non-distended. No masses. No organomegaly. No guarding or rebound. Normal bowel sounds throughout.  MUSCULOSKELETAL: No extremity edema. Compartments soft.  No deformity.  No tenderness in the extremities.  All extremities neurovascularly intact.  SKIN: Warm and dry. No acute rashes.   NEUROLOGICAL: GCS 11 as above, MAE x4, no obvious focal motor/sensory deficits, gait not assessed, 2+ patellar reflexes bilat, no obvious facial droop    LABS  I have reviewed all labs for this visit.   Results for orders placed during the  hospital encounter of 03/29/14   CBC       Result Value Ref Range    WBC 14.0 (*) 4.0 - 11.0 K/uL    RBC 5.06  4.20 - 5.90 M/uL    Hemoglobin 15.3  13.5 - 17.5 g/dL    Hematocrit 16.1  09.6 - 52.5 %    MCV 94.1  80.0 - 100.0 fL    MCH 30.3  26.0 - 34.0 pg    MCHC 32.2  31.0 - 36.0 g/dL    RDW 04.5  40.9 - 81.1 %    Platelets 283  135 - 450 K/uL    MPV 7.7  5.0 - 10.5 fL   COMPREHENSIVE METABOLIC PANEL       Result Value Ref Range    Sodium 148 (*) 136 - 145 mmol/L    Potassium 3.7  3.5 - 5.1 mmol/L    Chloride 105  99 - 110 mmol/L    CO2 13 (*) 21 - 32 mmol/L    Anion Gap 30 (*) 3 - 16    Glucose 128 (*) 70 - 99 mg/dL    BUN 8  7 - 20 mg/dL    CREATININE 1.0  0.9 - 1.3 mg/dL    GFR Non-African American >60  >60    GFR African American >60  >60    Calcium 9.5  8.3 - 10.6 mg/dL    Total Protein 8.6 (*) 6.4 - 8.2 g/dL    Alb 4.5  3.4 - 5.0 g/dL    Albumin/Globulin Ratio 1.1  1.1 - 2.2    Total Bilirubin 0.5  0.0 - 1.0 mg/dL    Alkaline Phosphatase 114  40 - 129 U/L    ALT 33  10 - 40 U/L    AST 40 (*) 15 - 37 U/L    Globulin 4.1     PROTIME-INR       Result Value Ref Range    Protime 11.7  9.8 - 13.0 sec    INR 1.03  0.85 - 1.16   BLOOD GAS, ARTERIAL       Result Value Ref Range    pH, Arterial 7.356  7.350 - 7.450    pCO2, Arterial 46.4 (*) 35.0 - 45.0 mmHg    pO2, Arterial 296.3 (*) 75.0 - 108.0 mmHg    HCO3, Arterial 25.4  21.0 - 29.0 mmol/L  Base Excess, Arterial -0.5  -3.0 - 3.0 mmol/L    Hemoglobin, Art, Extended 13.3 (*) 13.5 - 17.5 g/dL    O2 Sat, Arterial 16.1  >92 %    Carboxyhgb, Arterial 2.5 (*) 0.0 - 1.5 %    Methemoglobin, Arterial 0.5  <1.5 %    TCO2, Arterial 26.8  Not Established mmol/L    O2 Content, Arterial 19  Not Established mL/dL    O2 Therapy Unknown     LACTIC ACID, PLASMA       Result Value Ref Range    Lactic Acid 19.4 (*) 0.4 - 2.0 mmol/L   URINALYSIS       Result Value Ref Range    Color, UA YELLOW  Straw/Yellow    Clarity, UA CLOUDY (*) Clear    Glucose, Ur Negative  Negative mg/dL     Bilirubin Urine Negative  Negative    Ketones, Urine Negative  Negative mg/dL    Specific Gravity, UA 1.013  1.005-1.030    Blood, Urine LARGE (*) Negative    pH, UA 5.0  5.0-8.0    Protein, UA 100 (*) Negative mg/dL    Urobilinogen, Urine 0.2  <2.0 E.U./dL    Nitrite, Urine Negative  Negative    Leukocyte Esterase, Urine Negative  Negative    Microscopic Examination YES      Urine Type Not Specified     URINE DRUG SCREEN       Result Value Ref Range    Amphetamine Screen, Urine Neg  Negative <1000ng/mL    Barbiturate Screen, Ur Neg  Negative <200 ng/mL    Benzodiazepine Screen, Urine Neg  Negative <200 ng/mL    Cannabinoid Scrn, Ur POSITIVE (*) Negative <50 ng/mL    COCAINE METABOLITE SCREEN URINE Neg  Negative <300 ng/mL    Opiate Scrn, Ur Neg  Negative <300 ng/mL    PCP Scrn, Ur Neg  Negative <25 ng/mL    Methadone Screen, Urine Neg  Negative <300 ng/mL    Propoxyphene Scrn, Ur Neg  Negative <300 ng/mL    pH, UA 5.0      Drug Screen Comment: see below     ETHANOL       Result Value Ref Range    Ethanol Lvl None Detected     VALPROIC ACID LEVEL, TOTAL       Result Value Ref Range    Valproic Acid Lvl <2.8 (*) 50.0 - 100.0 ug/mL   MICROSCOPIC URINALYSIS       Result Value Ref Range    Urinalysis Comments see below      Hyaline Casts, UA 13 (*) 0 - 8 /HPF    WBC, UA 4  0 - 5 /HPF    RBC, UA 20 (*) 0 - 4 /HPF    Epi Cells 4  0 - 5 /HPF   POCT GLUCOSE       Result Value Ref Range    POC Glucose 119 (*) 70 - 99 mg/dl    Performed on ACCU-CHEK       RADIOLOGY    Xr Chest Portable    03/29/2014   EXAMINATION: SINGLE VIEW OF THE CHEST  03/29/2014 11:15 am  COMPARISON: 02/13/2014  HISTORY: seizures, aspiration  FINDINGS: There is perihilar airspace disease bilaterally, greater on the right.  No pleural effusions are seen.  Cardiac silhouette is normal.  There is scoliotic curvature of the thoracic spine.     03/29/2014   IMPRESSION: Perihilar airspace disease, pulmonary  edema (neurogenic) versus aspiration pneumonitis or  pneumonia.     The 12 lead EKG was interpreted by me as follows:  Rate: tachycardia with a rate of 101  Rhythm: sinus  Axis: normal  Intervals: normal PR, narrow QRS, normal QTc  ST segments: no ST elevations or depressions  T waves: no abnormal inversions  Non-specific T wave changes: not present  Prior EKG comparison: EKG dated 02/13/14 is significantly different due to improvement of previously seen diffuse ST elevations, pt does have evidence now of LVH    ED COURSE/MDM  Patient seen and evaluated. Old records reviewed. Labs and imaging reviewed and results discussed with patient.      After initial evaluation, differential diagnostic considerations included: stroke, TIA, intracranial bleed, trauma, infection/sepsis, medication side effect, intoxication/withdrawal, metabolic/electrolyte abnormalities    The patient's ED workup was notable for seizure with postictal period after 2 long seizures earlier today.  The concern is for status epilepticus with history of nonadherence to seizure antiepileptic regimens in the past.  He is also febrile and tachycardic on arrival with vomitus on his face concern for aspiration pneumonia.  Chest x-ray does confirm a pneumonia and therefore he was covered empirically with vancomycin and Zosyn and azithromycin after blood cultures were obtained.  His lactic acid level was profoundly elevated at 19.  Alcohol level was negative and urine drug screen only positive for cannabis.  He does not appear to have urinary tract infection.  Based on his identified source of infection I do not feel he needs a lumbar puncture at this time.  However he does have evidence of severe sepsis with his leukocytosis as well.  Foley catheter was placed.  Due to agitation soft restraints were required, however he is protecting his airway and therefore intubation was deferred.  He was stable on nonrebreather mask from a respiratory standpoint.  His valproic acid level is negligible indicating  nonadherence.  He was loaded with fosphenytoin.  He was also given 2 rounds of 2 mg of Ativan each with concern for status epilepticus although no further witnessed generalized tonic-clonic seizures were witnessed here.  Arterial blood gas does show acidosis but he received several liters of IV fluid at this and it was likely worse during his initial presentation.  He received a total of 3 L during his ED evaluation.  Seizure precautions were initiated on presentation.    During the patient's ED course, the patient was given:  Medications   ondansetron (ZOFRAN) injection 4 mg (4 mg Intravenous Given 03/29/14 1136)   vancomycin (VANCOCIN) 1,250 mg in dextrose 5 % 250 mL IVPB ( Intravenous Canceled Entry 03/29/14 1200)   piperacillin-tazobactam (ZOSYN) IVPB 3.375 g (not administered)   ipratropium-albuterol (DUONEB) nebulizer solution 1 ampule (0 ampules Inhalation Hold 03/29/14 1118)   0.9 % sodium chloride infusion (not administered)   azithromycin (ZITHROMAX) 500 mg in dextrose 5 % 250 mL IVPB (not administered)   0.9 % sodium chloride bolus (not administered)   sodium bicarbonate 8.4 % injection 50 mEq (not administered)   0.9 % sodium chloride bolus (0 mLs Intravenous Stopped 03/29/14 1255)   LORazepam (ATIVAN) injection 2 mg (2 mg Intravenous Given 03/29/14 1110)   acetaminophen (TYLENOL) suppository 650 mg (650 mg Rectal Given 03/29/14 1134)   fosphenytoin (CEREBYX) 1,260 mg PE in dextrose 5 % 50 mL IVPB (loading dose) (0 mg PE/kg ?? 83.9 kg Intravenous Stopped 03/29/14 1255)   LORazepam (ATIVAN) injection 2 mg (2 mg Intravenous Given 03/29/14 1141)  CLINICAL IMPRESSION  1. Convulsions, unspecified convulsion type (HCC)    2. Severe sepsis (HCC)    3. Lactic acidosis    4. Aspiration pneumonia, unspecified aspiration pneumonia type, unspecified laterality, unspecified part of lung (HCC)    5. Status epilepticus (HCC)        Blood pressure 107/73, pulse 110, temperature 102.4 ??F (39.1 ??C), resp. rate 25, weight 185  lb (83.915 kg), SpO2 99 %.    DISPOSITION  Annell Greeningrnest J Akridge was admitted in fair condition.    I have discussed the findings of today's workup with the patient and addressed the patient's questions and concerns.  The plan is to admit to the hospital at this time under the ICU hospitalist service.  I spoke with Dr. Nelva BushSuarez who accepted the patient and will take over the patient's care.  The patient is agreeable with this plan.    The total critical care time spent while evaluating and treating this patient was 45 minutes.  This excludes time spent doing separately billable procedures.  This includes time at the bedside, data interpretation, medication management, and physician consultation.  Specifics of interventions taken and diagnostic considerations are listed above in the medical decision making.    DISCLAIMER: This chart was created using Scientist, clinical (histocompatibility and immunogenetics)Dragon dictation software.  Efforts were made by me to ensure accuracy, however some errors may be present due to limitations of this technology and occasionally words are not transcribed correctly.        Dalbert BatmanKenneth Aaron Mio Schellinger, MD  03/29/14 1258    Dalbert BatmanKenneth Aaron Cagney Degrace, MD  03/29/14 [phone]

## 2014-03-30 ENCOUNTER — Inpatient Hospital Stay: Admit: 2014-03-30 | Primary: Internal Medicine

## 2014-03-30 LAB — CBC WITH AUTO DIFFERENTIAL
Basophils %: 0.8 %
Basophils Absolute: 0 10*3/uL (ref 0.0–0.2)
Eosinophils %: 2.5 %
Eosinophils Absolute: 0.1 10*3/uL (ref 0.0–0.6)
Hematocrit: 37.2 % — ABNORMAL LOW (ref 40.5–52.5)
Hemoglobin: 12.4 g/dL — ABNORMAL LOW (ref 13.5–17.5)
Lymphocytes %: 26.8 %
Lymphocytes Absolute: 1.4 10*3/uL (ref 1.0–5.1)
MCH: 30.3 pg (ref 26.0–34.0)
MCHC: 33.3 g/dL (ref 31.0–36.0)
MCV: 91.1 fL (ref 80.0–100.0)
MPV: 7.3 fL (ref 5.0–10.5)
Monocytes %: 11.4 %
Monocytes Absolute: 0.6 10*3/uL (ref 0.0–1.3)
Neutrophils %: 58.5 %
Neutrophils Absolute: 3 10*3/uL (ref 1.7–7.7)
Platelets: 179 10*3/uL (ref 135–450)
RBC: 4.09 M/uL — ABNORMAL LOW (ref 4.20–5.90)
RDW: 13.6 % (ref 12.4–15.4)
WBC: 5.1 10*3/uL (ref 4.0–11.0)

## 2014-03-30 LAB — EKG 12-LEAD
Atrial Rate: 101 {beats}/min
P Axis: 55 degrees
P-R Interval: 184 ms
Q-T Interval: 336 ms
QRS Duration: 78 ms
QTc Calculation (Bazett): 435 ms
R Axis: 88 degrees
T Axis: 61 degrees
Ventricular Rate: 101 {beats}/min

## 2014-03-30 LAB — BASIC METABOLIC PANEL
Anion Gap: 13 (ref 3–16)
BUN: 9 mg/dL (ref 7–20)
CO2: 25 mmol/L (ref 21–32)
Calcium: 8.5 mg/dL (ref 8.3–10.6)
Chloride: 110 mmol/L (ref 99–110)
Creatinine: 1 mg/dL (ref 0.9–1.3)
GFR African American: >60 (ref >60)
GFR Non-African American: >60 (ref >60)
Glucose: 91 mg/dL (ref 70–99)
Potassium: 4.2 mmol/L (ref 3.5–5.1)
Sodium: 148 mmol/L — ABNORMAL HIGH (ref 136–145)

## 2014-03-30 LAB — PHOSPHORUS: Phosphorus: 3.7 mg/dL (ref 2.5–4.9)

## 2014-03-30 LAB — LEVETIRACETAM LEVEL: Levetiracetam Lvl: 14 ug/mL (ref 6.0–46.0)

## 2014-03-30 LAB — LACTIC ACID
Lactic Acid: 1.2 mmol/L (ref 0.4–2.0)
Lactic Acid: 1.4 mmol/L (ref 0.4–2.0)

## 2014-03-30 LAB — MAGNESIUM: Magnesium: 2.2 mg/dL (ref 1.80–2.40)

## 2014-03-30 MED ORDER — DIVALPROEX SODIUM ER 500 MG PO TB24
500 MG | Freq: Every day | ORAL | Status: DC
Start: 2014-03-30 — End: 2014-03-31
  Administered 2014-03-30 – 2014-03-31 (×2): 1000 mg via ORAL

## 2014-03-30 MED ORDER — SODIUM CHLORIDE 0.9 % IV SOLN
0.9 % | INTRAVENOUS | Status: AC
Start: 2014-03-30 — End: 2014-03-30
  Administered 2014-03-30: 23:00:00 100

## 2014-03-30 MED ORDER — LEVETIRACETAM 500 MG PO TABS
500 MG | Freq: Two times a day (BID) | ORAL | Status: DC
Start: 2014-03-30 — End: 2014-03-31
  Administered 2014-03-30 – 2014-03-31 (×3): 1000 mg via ORAL

## 2014-03-30 MED FILL — ZOSYN 3-0.375 GM/50ML IV SOLN: INTRAVENOUS | Qty: 50

## 2014-03-30 MED FILL — DEPACON 100 MG/ML IV SOLN: 100 MG/ML | INTRAVENOUS | Qty: 5

## 2014-03-30 MED FILL — SODIUM CHLORIDE 0.9 % IV SOLN: 0.9 % | INTRAVENOUS | Qty: 100

## 2014-03-30 MED FILL — PNEUMOVAX 23 25 MCG/0.5ML IJ INJ: 25 MCG/0.5ML | INTRAMUSCULAR | Qty: 0.5

## 2014-03-30 MED FILL — DEPAKOTE ER 500 MG PO TB24: 500 MG | ORAL | Qty: 2

## 2014-03-30 MED FILL — LEVETIRACETAM IN NACL 1000 MG/100ML IV SOLN: 1000 MG/100ML | INTRAVENOUS | Qty: 100

## 2014-03-30 MED FILL — NORMAL SALINE FLUSH 0.9 % IV SOLN: 0.9 % | INTRAVENOUS | Qty: 10

## 2014-03-30 MED FILL — SODIUM CHLORIDE 0.9 % IV SOLN: 0.9 % | INTRAVENOUS | Qty: 1000

## 2014-03-30 MED FILL — FLUCELVAX 0.5 ML IM SUSY: 0.5 ML | INTRAMUSCULAR | Qty: 0.5

## 2014-03-30 MED FILL — LEVETIRACETAM 500 MG PO TABS: 500 MG | ORAL | Qty: 2

## 2014-03-30 MED FILL — LOVENOX 40 MG/0.4ML SC SOLN: 40 MG/0.4ML | SUBCUTANEOUS | Qty: 0.4

## 2014-03-30 NOTE — Progress Notes (Signed)
Report received from night shift RN.  Patient is awake in bed.  A&O x4.  Shift assessment completed and charted, see flow sheet.  Patient denies pain at this time.  Patient has no further request at this time.  Questions answered.  Call light within reach.  Bed in low position with wheels locked.  Encouraged to call for nurse as needed throughout the shift.  Will continue to monitor.  Plan of care discussed. Telemetry monitor in place. Heart rate parameters currently set as 120/50.  Will adjust as needed.  Flemon Kelty M. Channie Bostick, RN

## 2014-03-30 NOTE — Progress Notes (Signed)
[facility] PROGRESS NOTE    03/30/2014 8:04 AM        Name: Annell Greeningrnest J Hennigan .              Admitted: 03/29/2014  Primary Care Provider: [name], MD (Tel: None)      Subjective:  Marland Kitchen.    Pt seen and examined   Labs and records reviewed    Admitted yesterday due to seizures and non-compliance  Mentation is cleared   Neurologist managing AED     Reviewed interval ancillary notes    Current Medications    ondansetron (ZOFRAN) injection 4 mg Q1H PRN   sodium chloride flush 0.9 % injection 10 mL 2 times per day   sodium chloride flush 0.9 % injection 10 mL PRN   acetaminophen (TYLENOL) tablet 650 mg Q4H PRN   enoxaparin (LOVENOX) injection 40 mg Nightly   LORazepam (ATIVAN) injection 2 mg Q4H PRN   diazepam (VALIUM) injection 5 mg Q4H PRN   piperacillin-tazobactam (ZOSYN) IVPB extended infusion 3.375 g Q8H   levetiracetam (KEPPRA) 1000 mg/100 mL IVPB Q12H   valproate (DEPACON) 500 mg in dextrose 5 % 100 mL IVPB Q8H   pneumococcal vaccine (PNEUMOVAX 23) inj 0.5 mL Once   influenza tiss-cult vaccine (FLUCELVAX) injection 0.5 mL Once       Objective:  BP 112/68 mmHg   Pulse 92   Temp(Src) 98.8 ??F (37.1 ??C) (Temporal)   Resp 15   Ht 6' 3.98" (1.93 m)   Wt 178 lb 1.6 oz (80.786 kg)   BMI 21.69 kg/m2   SpO2 96%    Intake/Output Summary (Last 24 hours) at 03/30/14 0804  Last data filed at 03/30/14 0518   Gross per 24 hour   Intake 3512.09 ml   Output   2800 ml   Net 712.09 ml    Wt Readings from Last 3 Encounters:   03/30/14 178 lb 1.6 oz (80.786 kg)   03/07/14 185 lb (83.915 kg)   02/14/14 167 lb 8 oz (75.978 kg)       General appearance:  Appears comfortable  Eyes: Sclera clear. Pupils equal.  ENT: Moist oral mucosa. Trachea midline, no adenopathy.  Cardiovascular: Regular rhythm, normal S1, S2. No murmur. No edema in lower extremities  Respiratory: Not using accessory muscles. Good inspiratory effort. Clear to auscultation bilaterally,  no wheeze or crackles.   GI: Abdomen soft, no tenderness, not distended, normal bowel sounds  Musculoskeletal: No cyanosis in digits, neck supple  Neurology: CN 2-12 grossly intact. No speech or motor deficits  Psych: Normal affect. Alert and oriented in time, place and person  Skin: Warm, dry, normal turgor    Labs and Tests:  CBC:   Recent Labs      03/29/14   1105  03/30/14   0554   WBC  14.0*  5.1   HGB  15.3  12.4*   PLT  283  179     BMP:  Recent Labs      03/29/14   1105  03/29/14   1649  03/30/14   0554   NA  148*  143  148*   K  3.7  4.5  4.2   CL  105  109  110   CO2  13*  24  25   BUN  8  7  9    CREATININE  1.0  1.0  1.0   GLUCOSE  128*  98  91     Hepatic:  Recent Labs      03/29/14   1105  03/29/14   1649   AST  40*  34   ALT  33  28   BILITOT  0.5  0.5   ALKPHOS  114  83     Discussed POC with Dr. Hassel Nethajjan and DR Sheryn Bisonolangelo     I viewed CXR images and EKG tracings     Problem List  Active Problems:    Seizures (HCC)    History of nonadherence to medical treatment    Drug use    Partial epilepsy with impairment of consciousness (HCC)    Aspiration pneumonia of right lung (HCC)       Assessment & Plan:     Seizures due to non-compliance    No more seizures since here    Neurologist following    meds per neurologist     Aspiration pneumonia -- CXR c/w RLL ASD    Continue zosyn   Pending cultures     Lactic acidosis due to tonic-clonic seizures -- POA -- resolved now     Cannabis abuse -- counseled on drug cessation         Diet: Diet NPO Effective Now  Code:Full Code        Lerry LinerGretchen Celester Lech, MD   03/30/2014 8:04 AM

## 2014-03-30 NOTE — Progress Notes (Signed)
NEUROLOGY FOLLOWUP    HISTORY OF PRESENT ILLNESS :    Roy Weaver is a 28 y.o. male   History was obtained from the patient and dictations in the chart  Patient was admitted with intractable partial complex seizures  Patient has history of noncompliance with medication treatment  Depakote level was very low on admission  I had The patient on IV Keppra and IV Depakote last night  Patient has not had any further seizures  He is not sure if he missed any medications  He is aware that he missed a follow-up appointment in my office earlier this week    REVIEW OF SYSTEMS    Constitutional:  []    Chills   []   Fatigue   []   Fevers   []   Malaise   []   Weight loss     [x]  Denies all of the above    Respiratory:   []   Cough    []   Shortness of breath         [x]  Denies all of the above     Cardiovascular:   []   Chest pain    []   Exertional chest pressure/discomfort           []  Palpitations    []   Syncope     [x]  Denies all of the above    Past Medical History   Diagnosis Date   ??? Seizures (HCC)    ??? Migraine      Family History   Problem Relation Age of Onset   ??? Diabetes Father      History     Social History   ??? Marital Status: Single     Spouse Name: N/A     Number of Children: N/A   ??? Years of Education: N/A     Social History Main Topics   ??? Smoking status: Current Every Day Smoker -- 0.50 packs/day for 2 years     Types: Cigarettes   ??? Smokeless tobacco: Former NeurosurgeonUser     Quit date: 10/04/2013   ??? Alcohol Use: 0.0 oz/week     0 Not specified per week   ??? Drug Use: No      Comment: stopped smoking marijuana    ??? Sexual Activity: None     Other Topics Concern   ??? None     Social History Narrative    ** Merged History Encounter **             PHYSICAL EXAMINATION     BP 116/69 mmHg   Pulse 93   Temp(Src) 98.8 ??F (37.1 ??C) (Temporal)   Resp 18   Ht 6' 3.98" (1.93 m)   Wt 178 lb 1.6 oz (80.786 kg)   BMI 21.69 kg/m2   SpO2 97%  This is a  well-nourished patient in no acute distress  Patient is awake and alert.  He is oriented ??3 now.  Judgment and speech are normal.  Fund of knowledge is normal.  Pupils equal round reacting to light.  Visual fields are full.  External ocular movements are intact.  Face symmetrical.  Tongue midline.  Motor examination shows strength of 4+ over 5 all over.  Tone normal.  Reflexes 1 and symmetrical.  Plantar reflexes flexor response bilaterally.  Sensory exam normal for pinprick and light touch.  Coordination normal.  No carotid bruit.  No neck stiffness.  DATA :  LABS:  General Labs:    CBC:   Lab Results  Component Value Date    WBC 5.1 03/30/2014    RBC 4.09 03/30/2014    HGB 12.4 03/30/2014    HCT 37.2 03/30/2014    MCV 91.1 03/30/2014    MCH 30.3 03/30/2014    MCHC 33.3 03/30/2014    RDW 13.6 03/30/2014    PLT 179 03/30/2014    MPV 7.3 03/30/2014     BMP:    Lab Results   Component Value Date    NA 148 03/30/2014    K 4.2 03/30/2014    CL 110 03/30/2014    CO2 25 03/30/2014    BUN 9 03/30/2014    LABALBU 3.5 03/29/2014    CREATININE 1.0 03/30/2014    CREATININE 1.0 08/17/2011    CALCIUM 8.5 03/30/2014    GFRAA >60 03/30/2014    GFRAA 157 08/19/2011    LABGLOM >60 03/30/2014    GLUCOSE 91 03/30/2014     RADIOLOGY REVIEW:  I have reviewed radiology image(s) and reports(s) of:  CT scan of the head      IMPRESSION :  Intractable partial complex seizures  Noncompliance with medication  Patient Active Problem List   Diagnosis   ??? Seizures (HCC)   ??? Migraine   ??? Recurrent seizures (HCC)   ??? History of nonadherence to medical treatment   ??? Delirium   ??? Drug use   ??? Partial epilepsy with impairment of consciousness (HCC)   ??? Seizures (HCC)   ??? Partial epilepsy with impairment of consciousness, intractable (HCC)   ??? Noncompliance with medication regimen   ??? Aspiration pneumonia of right lung (HCC)   ??? 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 Nelva BushSuarez  I will change the IV Keppra and IV Depakote to oral form.  I will check a Depakote level tomorrow morning.  Discussed again with patient and stressed importance of taking medications on a regular basis.  Discussed with patient about driving restrictions and he is aware of them.          Please note a portion of this chart was generated using dragon dictation software. Although every effort was made to ensure the accuracy of this automated transcription, some errors in transcription may have occurred.         Essie ChristineVijay Joniel Graumann M.D.

## 2014-03-30 NOTE — Consults (Signed)
[facility]   Consult Note      Reason for Consult: Seizures   Requesting Physician: Dr Nelva Bush    Subjective:   CHIEF COMPLAINT / HPI:                The patient is a 28 y.o. male with significant past medical history of:      Diagnosis Date   ??? Seizures (HCC)    ??? Migraine         The patient presents with complaints of seizures. He is known to have a history of seizures and medical noncompliance. He again presented to ED in an apparent post-ictal state. This AM he is alert and answers questions appropriately.     Past Surgical History:    No past surgical history on file.  Current Medications:    Current facility-administered medications:ondansetron (ZOFRAN) injection 4 mg, 4 mg, Intravenous, Q1H PRN  sodium chloride flush 0.9 % injection 10 mL, 10 mL, Intravenous, 2 times per day  sodium chloride flush 0.9 % injection 10 mL, 10 mL, Intravenous, PRN  acetaminophen (TYLENOL) tablet 650 mg, 650 mg, Oral, Q4H PRN  enoxaparin (LOVENOX) injection 40 mg, 40 mg, Subcutaneous, Nightly  LORazepam (ATIVAN) injection 2 mg, 2 mg, Intravenous, Q4H PRN  diazepam (VALIUM) injection 5 mg, 5 mg, Intravenous, Q4H PRN  piperacillin-tazobactam (ZOSYN) IVPB extended infusion 3.375 g, 3.375 g, Intravenous, Q8H  levetiracetam (KEPPRA) 1000 mg/100 mL IVPB, 1,000 mg, Intravenous, Q12H  valproate (DEPACON) 500 mg in dextrose 5 % 100 mL IVPB, 500 mg, Intravenous, Q8H  pneumococcal vaccine (PNEUMOVAX 23) inj 0.5 mL, 0.5 mL, Intramuscular, Once  influenza tiss-cult vaccine (FLUCELVAX) injection 0.5 mL, 0.5 mL, Intramuscular, Once    No Known Allergies    Social History:    TOBACCO:   reports that he has been smoking Cigarettes.  He has a 1 pack-year smoking history. He quit smokeless tobacco use about 5 months ago.  ETOH:   reports that he drinks alcohol.  Patient currently lives independently  Environmental/chemical exposure: none known    Family History:       Problem Relation Age of Onset   ??? Diabetes Father      REVIEW  OF SYSTEMS:    CONSTITUTIONAL:  negative for fevers, chills, diaphoresis, activity change, appetite change, fatigue, night sweats and unexpected weight change.   EYES:  negative for blurred vision, eye discharge, visual disturbance and icterus  HEENT:  negative for hearing loss, tinnitus, ear drainage, sinus pressure, nasal congestion, epistaxis and snoring  RESPIRATORY:  See HPI  CARDIOVASCULAR:  negative for chest pain, palpitations, exertional chest pressure/discomfort, edema, syncope  GASTROINTESTINAL:  negative for nausea, vomiting, diarrhea, constipation, blood in stool and abdominal pain  GENITOURINARY:  negative for frequency, dysuria, urinary incontinence, decreased urine volume, and hematuria  HEMATOLOGIC/LYMPHATIC:  negative for easy bruising, bleeding and lymphadenopathy  ALLERGIC/IMMUNOLOGIC:  negative for recurrent infections, angioedema, anaphylaxis and drug reactions  ENDOCRINE:  negative for weight changes and diabetic symptoms including polyuria, polydipsia and polyphagia  MUSCULOSKELETAL:  negative for  pain, joint swelling, decreased range of motion and muscle weakness  NEUROLOGICAL:  negative for headaches, slurred speech, unilateral weakness  PSYCHIATRIC/BEHAVIORAL: negative for hallucinations, behavioral problems, confusion and agitation.     Objective:   PHYSICAL EXAM:      VITALS:  BP 112/68 mmHg   Pulse 92   Temp(Src) 98.8 ??F (37.1 ??C) (Temporal)   Resp 15   Ht 6' 3.98" (1.93 m)  Wt 178 lb 1.6 oz (80.786 kg)   BMI 21.69 kg/m2   SpO2 96%     24HR INTAKE/OUTPUT:    Intake/Output Summary (Last 24 hours) at 03/30/14 0900  Last data filed at 03/30/14 0518   Gross per 24 hour   Intake 3512.09 ml   Output   2800 ml   Net 712.09 ml     CONSTITUTIONAL:  awake, alert, cooperative, no apparent distress, and appears stated age  NECK:  Supple, symmetrical, trachea midline, no adenopathy, thyroid symmetric, not enlarged and no tenderness, skin normal  LUNGS:  no increased work of breathing and clear to  auscultation. No accessory muscle use  CARDIOVASCULAR: S1 and S2, no edema and no JVD  ABDOMEN:  normal bowel sounds, non-distended and no masses palpated, and no tenderness to palpation. No hepatospleenomegaly  LYMPHADENOPATHY:  no axillary or supraclavicular adenopathy. No cervical adnenopathy  PSYCHIATRIC: Oriented to person place and time. No obvious depression or anxiety.  MUSCULOSKELETAL: No obvious misalignment or effusion of the joints. No clubbing, cyanosis of the digits.  SKIN:  normal skin color, texture, turgor and no redness, warmth, or swelling. No palpable nodules    DATA:    Old records have been reviewed    CBC:  Recent Labs      03/29/14   1105  03/30/14   0554   WBC  14.0*  5.1   RBC  5.06  4.09*   HGB  15.3  12.4*   HCT  47.6  37.2*   PLT  283  179   MCV  94.1  91.1   MCH  30.3  30.3   MCHC  32.2  33.3   RDW  13.7  13.6      BMP:  Recent Labs      03/29/14   1105  03/29/14   1649  03/30/14   0554   NA  148*  143  148*   K  3.7  4.5  4.2   CL  105  109  110   CO2  13*  24  25   BUN  8  7  9    CREATININE  1.0  1.0  1.0   CALCIUM  9.5  8.2*  8.5   GLUCOSE  128*  98  91      ABG:  Recent Labs      03/29/14   1240   PHART  7.356   PCO2ART  46.4*   PO2ART  296.3*   HCO3ART  25.4   O2SATART  99.8   BEART  -0.5       No results found for: BNP  Lab Results   Component Value Date    CKTOTAL 360* 03/29/2014    TROPONINI <0.01 09/15/2013       Radiology Review:  All pertinent images / reports were reviewed as a part of this visit.     Assessment:     Patient Active Problem List   Diagnosis   ??? Seizures (HCC)   ??? Migraine   ??? Recurrent seizures (HCC)   ??? History of nonadherence to medical treatment   ??? Delirium   ??? Drug use   ??? Partial epilepsy with impairment of consciousness (HCC)   ??? Seizures (HCC)   ??? Partial epilepsy with impairment of consciousness, intractable (HCC)   ??? Noncompliance with medication regimen   ??? Aspiration pneumonia of right lung (HCC)     Plan:     1. Neurologically at baseline  2. EEG  is normal  3. CXR shows some right perihilar disease, possibly on the basis of aspiration.  4. Repeat CXR this AM. WBC is back to normal  5. He is on Zosyn to cover for aspiration.  6. If CXR clears, could stop Abx  7. I will sign off.

## 2014-03-30 NOTE — Progress Notes (Addendum)
Speech Language/Pathology   SPEECH LANGUAGE AND CLINICAL BEDSIDE SWALLOWING EVALUATION   Speech Therapy Department     Patient Name:  Annell Greeningrnest J Roebuck  DOB:  12/18/1985  Pain level: 0/10  Medical Diagnosis:  STATUS EPILEPTICUS [facility](HCC) [[id number]][[id number]]    History: Patient is a 28yo male with a history of seizure and migraine who presents on 03/29/14 via EMS after a 20 minute seizure at home. Patient had another witnessed seizure after EMS arrived at the scene. The patient arrived postictal, hypoxic and with emesis on his face. The HCT (03/29/14) reveals there is no intracerebral hemorrhage or extra-axial fluid collection. The CXR (03/29/14) reveals perihilar airspace disease, neurogenic pulmonary edema vs. Aspiration PNA. An aspiration episode is likely as patient was vomiting while seizing and hypoxic.    CLINICAL SWALLOW EVALUATION:  Dysphagia Treatment Diagnosis: WFL     Impressions: The [facility] for mastication and deglutition of hard solids and thin liquids. The patient was able to independently drink thin liquids from a cup (x8) and a straw (x10) with single and with consecutive swallows without overt s/s of penetration/aspiration. He was able to masticate puree (x3), soft solids (x5), and hard solids (x8) without difficulty and without oral residue. The patient reports no baseline dysphagia.     Dietary Recommendations: Regular diet with Thin liquids    Strategies: 90 degree positioning with all p.o. intake     Dysphagia Treatment/Goals: No tx for dysphagia.      SPEECH LANGUAGE EVALUATION:  Speech Language Treatment Diagnosis:  Cognitive-linguistic deficits    Speech Language Impressions: The [facility] appears [facility] for speech, expressive language, and receptive language skills.    Oral Motor exam/Motor Speech: WFL, 100% intelligible at the conversational level, adequate breath support     Verbal Expression: WFL, able to name high and low frequency pictures at 100% accuracy    Auditory Comprehension: Able to follow  1 and 2 step commands at 100% accuracy, 3 step commands at 90% accuracy due to reduced attention to directions. Able to answer complex questions at 90% accuracy.    Cognitive-Linguistic: The patient was administered the [facility] Cognitive Assessment (MoCA) for assessment of orientation, short-term memory, visuo-spatial skills, langauge, processing, and organization. A normal score is considered 26/30 or greater. The patient scored a 20/30, indicating deficits. The main areas of deficit were with processing, attention, and short-term memory. The patient denies any history of attention issues or memory problems.     Plan: Speech therapy 3-5 times a weeks for speech-language treatment, and dysphagia treatment     Discharge Recommendations:  Pt will benefit from continued skilled Speech Therapy for Speech and Dysphagia services, prior to returning home.    Prior Status: Independently at home, working at [employer]    Speech Language Short-term goals:   1.Pt will improve auditory processing/comprehension of commands and questions to 100% independently.   2.Pt will improve short term recall via graded tasks to 90% independently.  3. Pt will demonstrate adequate processing time for a divided attention task at 90% accuracy independently.    Patient/Family Education:Education, results and recommendations given to the Pt, who verbalized understanding    Timed Code Treatment: 0 mins    Total Treatment Time: 30 mins     Alen BlewLeah C. Alyssia Heese, M.A., CCC-SLP  Speech-Language Pathologist

## 2014-03-30 NOTE — Progress Notes (Signed)
Pt transferred to [id number] with all personal belongings via wheelchair by transport.  Derenda MisHRISTINE M. Joana Nolton, RN

## 2014-03-30 NOTE — Progress Notes (Signed)
Report called to Adelina MingsKelsey, RN on 4T.  Derenda MisHRISTINE M. Christina Gintz, RN

## 2014-03-30 NOTE — Progress Notes (Signed)
Up to floor, VSS. In bed, alarm on.

## 2014-03-30 NOTE — Progress Notes (Signed)
No acute changes.  Pt remains free of seizure activity.  Derenda MisHRISTINE M. Shandrea Lusk, RN

## 2014-03-30 NOTE — Plan of Care (Signed)
Problem: Falls - Risk of  Goal: Absence of falls  Outcome: Ongoing  Patient placed on fall Precautions per Morse Fall Risk Assessment Scale. Fall risk armband on, orange blanket placed on foot of bed, S.A.F.E. sign displayed at door. Bed in locked and in lowest position, bed alarm armed and audible, call light and bedside table in reach. Patient acknowledges the need to call before getting out of bed.

## 2014-03-30 NOTE — Progress Notes (Signed)
Shift assessment complete, see flow sheet.  VSS  Pt denies any pain/discomfort at this time.  Pt requesting to leave floor to smoke.  Educated pt on facility non smoking policy.  Pt denies need for nicotine patch at this time.  Pt denies any further needs at this time.  Call light in reach, fall precautions in place.  Bed alarm on.  Will continue to monitor

## 2014-03-30 NOTE — Progress Notes (Signed)
Reassessment completed and documented- see doc flow sheets. VSS, afebrile. No acute assessment changes. Pt's brother at bedside. Updated emergency contacts. Brother states that patient it very non compliant with seizure medications. Will pass on in report. Pt more awake, sitting up in bed talking to brother. Safety precautions in place. Will monitor.

## 2014-03-31 LAB — BASIC METABOLIC PANEL
Anion Gap: 11 (ref 3–16)
BUN: 5 mg/dL — ABNORMAL LOW (ref 7–20)
CO2: 24 mmol/L (ref 21–32)
Calcium: 8.8 mg/dL (ref 8.3–10.6)
Chloride: 104 mmol/L (ref 99–110)
Creatinine: 0.7 mg/dL — ABNORMAL LOW (ref 0.9–1.3)
GFR African American: >60 (ref >60)
GFR Non-African American: >60 (ref >60)
Glucose: 88 mg/dL (ref 70–99)
Potassium: 3.8 mmol/L (ref 3.5–5.1)
Sodium: 139 mmol/L (ref 136–145)

## 2014-03-31 LAB — CBC WITH AUTO DIFFERENTIAL
Basophils %: 0.8 %
Basophils Absolute: 0 10*3/uL (ref 0.0–0.2)
Eosinophils %: 6.7 %
Eosinophils Absolute: 0.3 10*3/uL (ref 0.0–0.6)
Hematocrit: 35.9 % — ABNORMAL LOW (ref 40.5–52.5)
Hemoglobin: 12.3 g/dL — ABNORMAL LOW (ref 13.5–17.5)
Lymphocytes %: 26.8 %
Lymphocytes Absolute: 1.4 10*3/uL (ref 1.0–5.1)
MCH: 30.7 pg (ref 26.0–34.0)
MCHC: 34.4 g/dL (ref 31.0–36.0)
MCV: 89.1 fL (ref 80.0–100.0)
MPV: 7.6 fL (ref 5.0–10.5)
Monocytes %: 10.7 %
Monocytes Absolute: 0.6 10*3/uL (ref 0.0–1.3)
Neutrophils %: 55 %
Neutrophils Absolute: 2.9 10*3/uL (ref 1.7–7.7)
Platelets: 160 10*3/uL (ref 135–450)
RBC: 4.03 M/uL — ABNORMAL LOW (ref 4.20–5.90)
RDW: 12.7 % (ref 12.4–15.4)
WBC: 5.2 10*3/uL (ref 4.0–11.0)

## 2014-03-31 LAB — PHOSPHORUS: Phosphorus: 3.3 mg/dL (ref 2.5–4.9)

## 2014-03-31 LAB — MAGNESIUM: Magnesium: 1.9 mg/dL (ref 1.80–2.40)

## 2014-03-31 LAB — VALPROIC ACID LEVEL, TOTAL: Valproic Acid Lvl: 48.8 ug/mL — ABNORMAL LOW (ref 50.0–100.0)

## 2014-03-31 MED ORDER — DIVALPROEX SODIUM ER 500 MG PO TB24
500 MG | ORAL_TABLET | Freq: Two times a day (BID) | ORAL | Status: DC
Start: 2014-03-31 — End: 2014-04-10

## 2014-03-31 MED ORDER — LEVETIRACETAM 1000 MG PO TABS
1000 MG | ORAL_TABLET | Freq: Two times a day (BID) | ORAL | Status: DC
Start: 2014-03-31 — End: 2014-05-19

## 2014-03-31 MED FILL — LEVETIRACETAM 500 MG PO TABS: 500 MG | ORAL | Qty: 2

## 2014-03-31 MED FILL — DEPAKOTE ER 500 MG PO TB24: 500 MG | ORAL | Qty: 2

## 2014-03-31 MED FILL — ZOSYN 3-0.375 GM/50ML IV SOLN: INTRAVENOUS | Qty: 50

## 2014-03-31 MED FILL — LOVENOX 40 MG/0.4ML SC SOLN: 40 MG/0.4ML | SUBCUTANEOUS | Qty: 0.4

## 2014-03-31 MED FILL — NORMAL SALINE FLUSH 0.9 % IV SOLN: 0.9 % | INTRAVENOUS | Qty: 10

## 2014-03-31 NOTE — Progress Notes (Signed)
AM assessment completed and documented. Pt awake, in bed, alert and oriented. Pt denies any further needs at this time. Bed alarm on, call light in reach, will continue to monitor.

## 2014-03-31 NOTE — Discharge Summary (Signed)
[facility] DISCHARGE SUMMARY    Patient Demographics    Patient: Roy Weaver  Date of Birth:  Jan 15, 1986  MRN:  [id number]   Code: Full Code    Primary care provider. NISAR FATIMA HAQ, MD  (Tel: None)    Admit date: 03/29/2014    Discharge date (blank if same as Note Date):   Note Date: 03/31/2014     Reason for Hospitalization.   Chief Complaint   Patient presents with   ??? Seizures     PT to er reporting seizures last night and this morning.  Pt has seizure hx and hx of not taking medication.  MD at bedside.           Significant Findings.   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  All need follow up     Significant test results and incidental findings.  Follow up chest xray demonstrated clearing of PNA    Invasive procedures and treatments.   1. None     Problem-based Hospital Course.  The patient was admitted with seizure activity and a post ictal state.  He has long standing history of non compliance with his medications.  His depakote levels were low and his medications were adjusted appropriately.  He was also followed by neurology.  He was initially admitted to the ICU then transferred to the floor.  Initial chest xray suggested possible pna that was presumed to be due to aspiration and he was treated with IV zosyn.  A follow up chest xray demonstrated clearing and he was given any antibiotics at the time of discharge.    Script were provided at d/c and he was asked to get blood work ( LFT's , cbc and depakote level ) in 2 weeks. He was also asked to stop using marijuana, he however replied " that is just not gonna  happen."    Consults.  IP CONSULT TO HOSPITALIST  IP CONSULT TO CRITICAL CARE  IP CONSULT TO NEUROLOGY  IP CONSULT TO PULMONOLOGY  IP CONSULT TO FINANCIAL COUNSELOR    Physical examination on discharge day.   BP 123/70 mmHg   Pulse 64   Temp(Src) 98.3 ??F (36.8 ??C) (Oral)   Resp 18   Ht 6' 3.98" (1.93 m)   Wt 178 lb 1.6 oz (80.786 kg)   BMI 21.69 kg/m2   SpO2 93%  General appearance.  Alert. Looks comfortable., speech is rambling at intervals   HEENT. Sclera clear. Moist mucus membranes.  Cardiovascular. Regular rate and rhythm, normal S1, S2. No murmur.   Respiratory. Not using accessory muscles.Clear to auscultation bilaterally, no wheeze.  Gastrointestinal. Abdomen soft, non-tender, not distended, normal bowel sounds  Neurology. Facial symmetry. No speech deficits. Moving all extremities equally.  Extremities. No edema in lower extremities.  Skin. Warm, dry, normal turgor    Medication instructions provided to patient at discharge.     Medication List      CHANGE how you take these medications          divalproex 500 MG ER tablet   Commonly known as:  DEPAKOTE ER   Take 1 tablet  by mouth 2 times daily   What changed:    - how much to take  - when to take this       levETIRAcetam 1000 MG tablet   Commonly known as:  KEPPRA   Take 1 tablet by mouth 2 times daily   What changed:    - medication strength  - how much to take         CONTINUE taking these medications          nicotine 21 MG/24HR   Commonly known as:  NICODERM CQ   Place 1 patch onto the skin every 24 hours.         Where to Get Your Medications     These are the prescriptions that you need to pick up.         You may get the following medications from any pharmacy   -  divalproex 500 MG ER tablet   -  levETIRAcetam 1000 MG tablet                      Discharge recommendations given to patient.  Follow Up.  in 1 week   Disposition. Home  Activity. As tolerated   Diet: DIET GENERAL;      Code status:  Full Code     Spent > 30  minutes in discharge  process.    SignedTawni Carnes:  [name], CNP     03/31/2014 11:52 AM

## 2014-03-31 NOTE — Progress Notes (Signed)
Discharge instructions reviewed with Pt. Pt states that he will not stop smoking marijuana. Pt states that he will be compliant with medications. Pt discharged with all belongings, friend at bedside to take Pt home. PIVs removed. Pt discharged with prescriptions for Depakote and for Keppra.

## 2014-03-31 NOTE — Discharge Instructions (Signed)
Good nutrition is important when healing from an illness, injury, or surgery. Follow any nutrition recommendations given to you during the hospital stay. If you are instructed to take an oral nutrition supplement at home, you can take it with meals, in-between meals, and/or before bedtime. These supplements can be purchased at most local grocery stores, pharmacies, and chain super-stores. If you need help in covering the cost of your medication or oral supplement, visit www.RxAssist.org. If you have any questions about your diet or nutrition, call the hospital and ask for the dietitian.       Your nutrition orders at the time of discharge were:  DIET GENERAL;.

## 2014-03-31 NOTE — Progress Notes (Signed)
Complete linen change. Pt asking to be discharged. Pt denies any needs at this time. Bed alarm on, call light in reach, will continue to monitor.

## 2014-03-31 NOTE — Progress Notes (Signed)
NEUROLOGY FOLLOWUP    HISTORY OF PRESENT ILLNESS :    Roy Weaver is a 28 y.o. male   History was obtained from the patient and dictations in the chart  Patient was admitted with intractable seizures  Patient has been noncompliant with his medications  He was loaded up with both Keppra and Depakote  Patient has not had any further seizures in the hospital  No other neurological symptoms    REVIEW OF SYSTEMS    Constitutional:  []    Chills   []   Fatigue   []   Fevers   []   Malaise   []   Weight loss     [x]  Denies all of the above    Respiratory:   []   Cough    []   Shortness of breath         [x]  Denies all of the above     Cardiovascular:   []   Chest pain    []   Exertional chest pressure/discomfort           []  Palpitations    []   Syncope     [x]  Denies all of the above    Past Medical History   Diagnosis Date   ??? Seizures (HCC)    ??? Migraine      Family History   Problem Relation Age of Onset   ??? Diabetes Father      History     Social History   ??? Marital Status: Single     Spouse Name: N/A     Number of Children: N/A   ??? Years of Education: N/A     Social History Main Topics   ??? Smoking status: Current Every Day Smoker -- 0.50 packs/day for 2 years     Types: Cigarettes   ??? Smokeless tobacco: Former NeurosurgeonUser     Quit date: 10/04/2013   ??? Alcohol Use: 0.0 oz/week     0 Not specified per week   ??? Drug Use: No      Comment: stopped smoking marijuana    ??? Sexual Activity: None     Other Topics Concern   ??? None     Social History Narrative    ** Merged History Encounter **             PHYSICAL EXAMINATION     BP 123/70 mmHg   Pulse 64   Temp(Src) 98.3 ??F (36.8 ??C) (Oral)   Resp 18   Ht 6' 3.98" (1.93 m)   Wt 178 lb 1.6 oz (80.786 kg)   BMI 21.69 kg/m2   SpO2 93%  This is a well-nourished patient in no acute distress  Patient is awake, alert and oriented x3. Speech is normal.  Pupils are equal round reacting to light. Extraocular  movements intact. Face symmetrical. Tongue midline.  Motor exam shows normal symmetrical strength. Deep tendon reflexes normal. Plantar reflexes downgoing.  Sensory exam normal. Coordination normal. Gait normal. No carotid bruit. No neck stiffness.      DATA :  LABS:  General Labs:    CBC:   Lab Results   Component Value Date    WBC 5.2 03/31/2014    RBC 4.03 03/31/2014    HGB 12.3 03/31/2014    HCT 35.9 03/31/2014    MCV 89.1 03/31/2014    MCH 30.7 03/31/2014    MCHC 34.4 03/31/2014    RDW 12.7 03/31/2014    PLT 160 03/31/2014    MPV 7.6 03/31/2014  BMP:    Lab Results   Component Value Date    NA 139 03/31/2014    K 3.8 03/31/2014    CL 104 03/31/2014    CO2 24 03/31/2014    BUN 5 03/31/2014    LABALBU 3.5 03/29/2014    CREATININE 0.7 03/31/2014    CREATININE 1.0 08/17/2011    CALCIUM 8.8 03/31/2014    GFRAA >60 03/31/2014    GFRAA 157 08/19/2011    LABGLOM >60 03/31/2014    GLUCOSE 88 03/31/2014         IMPRESSION :  Intractable partial complex seizures  Noncompliant with medications  Depakote level was borderline at 48  Patient Active Problem List   Diagnosis   ??? Seizures (HCC)   ??? Migraine   ??? Recurrent seizures (HCC)   ??? History of nonadherence to medical treatment   ??? Delirium   ??? Drug use   ??? Partial epilepsy with impairment of consciousness (HCC)   ??? Seizures (HCC)   ??? Partial epilepsy with impairment of consciousness, intractable (HCC)   ??? Noncompliance with medication regimen   ??? Aspiration pneumonia of right lung (HCC)   ??? 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)       RECOMMENDATIONS :  Continue Keppra 1000 mg by mouth twice a day  Continue Depakote 500 mg by mouth twice a day  Discussed at length with patient and strongly urged him to take his medications regularly  Patient is aware of the driving restrictions  Patient needs Depakote level CBC and liver enzymes checked in 2 weeks  Okay to discharge from a neurological standpoint  Patient has had several  appointments in our office in the past and never kept them  He can get his medication refills from his primary care physician          Please note a portion of this chart was generated using dragon dictation software. Although every effort was made to ensure the accuracy of this automated transcription, some errors in transcription may have occurred.         Essie ChristineVijay Lebaron Bautch M.D.

## 2014-03-31 NOTE — Progress Notes (Signed)
Patient is resting in bed with eyes closed.  No S/S of distress noted.  Respirations are easy and unlabored.  Will continue to monitor.

## 2014-04-01 NOTE — Progress Notes (Signed)
Speech/Language Pathology  Discharge Note    Name: Roy Weaver  DOB: March 25, 1986    Speech Therapy/Dysphagia  Pt discharged to home on 03/31/14. Bedside Swallow Eval and Speech-Language Eval completed 03/30/14 (see report). Eval only completed, no goals met prior to discharge. Recommend: Continued Speech-Language treatment at home, with goals and treatment per [facility].    DIET LEVEL AT DISCHARGE:  Regular diet with thin liquids    SPEECH-LANGUAGE GOALS:  1.Pt will improve auditory processing/comprehension of commands and questions to 100% independently. (GOAL NOT MET)  2.Pt will improve short term recall via graded tasks to 90% independently. (GOAL NOT MET)  3. Pt will demonstrate adequate processing time for a divided attention task at 90% accuracy independently. (GOAL NOT MET)    Faythe Casa, M.A., [place]  Speech-Language Pathologist

## 2014-04-09 ENCOUNTER — Inpatient Hospital Stay
Admit: 2014-04-10 | Discharge: 2014-04-10 | Disposition: A | Discharge status: Home / Self Care | Source: Home / Self Care | Admitting: Internal Medicine

## 2014-04-09 DIAGNOSIS — R569 Unspecified convulsions: Principal | ICD-10-CM

## 2014-04-09 LAB — COMPREHENSIVE METABOLIC PANEL
ALT: 21 U/L (ref 10–40)
AST: 24 U/L (ref 15–37)
Albumin/Globulin Ratio: 1.3 (ref 1.1–2.2)
Albumin: 4.5 g/dL (ref 3.4–5.0)
Alkaline Phosphatase: 95 U/L (ref 40–129)
Anion Gap: 12 (ref 3–16)
BUN: 7 mg/dL (ref 7–20)
CO2: 26 mmol/L (ref 21–32)
Calcium: 9.3 mg/dL (ref 8.3–10.6)
Chloride: 99 mmol/L (ref 99–110)
Creatinine: 0.8 mg/dL — ABNORMAL LOW (ref 0.9–1.3)
GFR African American: >60 (ref >60)
GFR Non-African American: >60 (ref >60)
Globulin: 3.6 g/dL
Glucose: 104 mg/dL — ABNORMAL HIGH (ref 70–99)
Potassium: 4.6 mmol/L (ref 3.5–5.1)
Sodium: 137 mmol/L (ref 136–145)
Total Bilirubin: 0.4 mg/dL (ref 0.0–1.0)
Total Protein: 8.1 g/dL (ref 6.4–8.2)

## 2014-04-09 LAB — TROPONIN: Troponin: <0.01 ng/mL (ref <0.01)

## 2014-04-09 LAB — VALPROIC ACID LEVEL, TOTAL: Valproic Acid Lvl: 21.5 ug/mL — ABNORMAL LOW (ref 50.0–100.0)

## 2014-04-09 NOTE — ED Notes (Signed)
Seizures pads in place.     Michiel Sitesricia Habib Kise, RN  04/09/14 [phone]

## 2014-04-09 NOTE — ED Notes (Signed)
Bed: 29-01  Expected date:   Expected time:   Means of arrival:   Comments:  squad

## 2014-04-09 NOTE — ED Notes (Signed)
Pts brother Sharia ReeveJosh to bedside. Per brother pt is not compliant with his seizure medications. Brother is concerned over patients safety at home, as well as the safety of his children, as patient lives with brother and his wife.     Domingo DimesKelly Dreyden Rohrman, RN  04/09/14 2130

## 2014-04-09 NOTE — ED Provider Notes (Signed)
[facility]  eMERGENCY dEPARTMENT eNCOUnter        Pt Name: Roy Weaver  MRN: [id number]  Birthdate [birth year]  Date of evaluation: 04/09/2014  Provider: Verdon CumminsBenjamin J Maryanne Huneycutt, PA  PCP: [name], MD  ED Attending: No att. providers found    CHIEF COMPLAINT       Chief Complaint   Patient presents with   ??? Seizures     pt arrived via [place].  seizure today and laceration on lip       HISTORY OF PRESENT ILLNESS   (Location/Symptom, Timing/Onset, Context/Setting, Quality, Duration, Modifying Factors, Severity)  Note limiting factors.     Roy Greeningrnest J Symons is a 28 y.o. male who Presents to ED with complaint of seizure.  Patient has a long-standing history of seizures for the past year.  Patient states he has been relatively noncompliant with his medication.  Patient states around 2 PM this evening he had a seizure that lasted approximately 15 minutes.  Patient states he bit his lip and had bleeding from his mouth.  Patient denies any bowel or urinary incontinence.  Patient states after the seizure he immediately became aware and was fine since then.  Patient states he was trying to go to sleep after that and was unable to and got nervous and came to the ED.  Patient states he was recently admitted for a seizure when he became unresponsive and postictal about a week ago.  Patient states he saw his neu rologist here in the hospital.  Patient states he currently follows up with Dr. Glean Hessajan, but is in the process of finding a new neurologist.  Patient states he has been taking his Keppra and Depakote as prescribed since he was released from the hospital.  Denies any chest pain, shortness of breath, headache, lightheadedness/dizziness, visual disturbances, speech disturbances, abdominal pain, nausea/vomiting, or fever/chills.  Patient denies any other associated injury from the seizure.     Nursing Notes were all reviewed and agreed with or any disagreements were addressed  in the  HPI.    REVIEW OF SYSTEMS    (2-9 systems for level 4, 10 or more for level 5)     Review of Systems   Constitutional: Negative for fever and chills.   Eyes: Negative for photophobia and visual disturbance.   Respiratory: Negative.  Negative for cough and shortness of breath.    Cardiovascular: Negative.  Negative for chest pain.   Gastrointestinal: Negative for nausea, vomiting and abdominal pain.   Genitourinary: Negative for dysuria and difficulty urinating.   Neurological: Positive for seizures. Negative for dizziness, syncope, speech difficulty, weakness, light-headedness and headaches.       Except as noted above in the ROS, all other systems were reviewed and negative.       PAST MEDICAL HISTORY     Past Medical History   Diagnosis Date   ??? Seizures (HCC)    ??? Migraine          SURGICAL HISTORY     History reviewed. No pertinent past surgical history.      CURRENT MEDICATIONS       Previous Medications    DIVALPROEX (DEPAKOTE ER) 500 MG ER TABLET    Take 1 tablet by mouth 2 times daily    LEVETIRACETAM (KEPPRA) 1000 MG TABLET    Take 1 tablet by mouth 2 times daily    NICOTINE (NICODERM CQ) 21 MG/24HR    Place 1 patch onto the skin every  24 hours.         ALLERGIES     Review of patient's allergies indicates no known allergies.    FAMILY HISTORY       Family History   Problem Relation Age of Onset   ??? Diabetes Father           SOCIAL HISTORY       History     Social History   ??? Marital Status: Single     Spouse Name: N/A     Number of Children: N/A   ??? Years of Education: N/A     Social History Main Topics   ??? Smoking status: Current Every Day Smoker -- 0.50 packs/day for 2 years     Types: Cigarettes   ??? Smokeless tobacco: Former Neurosurgeon     Quit date: 10/04/2013   ??? Alcohol Use: 0.0 oz/week     0 Not specified per week   ??? Drug Use: No      Comment: stopped smoking marijuana    ??? Sexual Activity: None     Other Topics Concern   ??? None     Social History Narrative    ** Merged History Encounter **             SCREENINGS             PHYSICAL EXAM    (up to 7 for level 4, 8 or more for level 5)   ED Triage Vitals   BP Temp Temp Source Pulse Resp SpO2 Height Weight   04/09/14 1805 04/09/14 1805 04/09/14 1805 04/09/14 1805 04/09/14 1805 04/09/14 1805 04/09/14 1805 04/09/14 1805   149/73 mmHg 97.9 ??F (36.6 ??C) Oral 93 18 96 % 6\' 4"  (1.93 m) 160 lb (72.576 kg)       Physical Exam   Constitutional: He is oriented to person, place, and time. He appears well-developed and well-nourished.   HENT:   Head: Normocephalic and atraumatic.   Right Ear: External ear normal.   Left Ear: External ear normal.   Eyes: EOM are normal. Pupils are equal, round, and reactive to light. Right eye exhibits no discharge. Left eye exhibits no discharge.   Neck: Normal range of motion. Neck supple.   Cardiovascular: Normal rate, regular rhythm and normal heart sounds.  Exam reveals no gallop and no friction rub.    No murmur heard.  Pulmonary/Chest: Effort normal and breath sounds normal. No stridor. No respiratory distress. He has no wheezes. He has no rales.   Abdominal: Soft. He exhibits no distension. There is no tenderness. There is no rebound and no guarding.   Musculoskeletal: Normal range of motion.   Neurological: He is alert and oriented to person, place, and time. He has normal strength. No cranial nerve deficit or sensory deficit. He displays a negative Romberg sign.   Alert and oriented ??3.  Cranial nerves II through XII intact.  Full range of motion, strength, and sensation throughout.  Negative Romberg.  Point-to-point, rapid alternating movements, and heel-to-shin intact.   Skin: Skin is warm and dry. He is not diaphoretic. No erythema. No pallor.   Psychiatric: He has a normal mood and affect. His behavior is normal.       DIAGNOSTIC RESULTS   LABS:    Labs Reviewed   VALPROIC ACID LEVEL, TOTAL - Abnormal; Notable for the following:     Valproic Acid Lvl 21.5 (*)     All other components within normal limits  COMPREHENSIVE  METABOLIC PANEL - Abnormal; Notable for the following:     Glucose 104 (*)     CREATININE 0.8 (*)     All other components within normal limits   LEVETIRACETAM LEVEL   CBC WITH AUTO DIFFERENTIAL   TROPONIN       All other labs were within normal range or not returned as of this dictation.    EKG: All EKG's are interpreted by the Emergency Department Physician who either signs or Co-signs this chart in the absence of a cardiologist.    Normal sinus rhythm with ventricular rate of 83 bpm.  LVH noted.  No ectopic beats noted.  No significant ST segment/T-wave abnormality is noted.  No signs of acute ischemia.  EKG appears unchanged from previous done 03/29/14.    RADIOLOGY:   Non-plain film images such as CT, Ultrasound and MRI are read by the radiologist. Plain radiographic images are visualized and preliminarily interpreted by the  ED Provider with the below findings:      Interpretation per the Radiologist below, if available at the time of this note:       Xr Chest Portable    03/30/2014   EXAMINATION: SINGLE VIEW OF THE CHEST  03/30/2014 11:09 am  COMPARISON: 03/29/2014  HISTORY: pneumonia  FINDINGS: Heart size is normal.  .  Mediastinal contours or normal.  There is notable improved aeration the lungs bilaterally.  A few subtle opacities remain.     03/30/2014   IMPRESSION: Near complete resolution of airspace disease.     Xr Chest Portable    03/29/2014   EXAMINATION: SINGLE VIEW OF THE CHEST  03/29/2014 11:15 am  COMPARISON: 02/13/2014  HISTORY: seizures, aspiration  FINDINGS: There is perihilar airspace disease bilaterally, greater on the right.  No pleural effusions are seen.  Cardiac silhouette is normal.  There is scoliotic curvature of the thoracic spine.     03/29/2014   IMPRESSION: Perihilar airspace disease, pulmonary edema (neurogenic) versus aspiration pneumonitis or pneumonia.       PROCEDURES   Unless otherwise noted below, none     Procedures    CRITICAL CARE TIME   N/A    CONSULTS:  IP CONSULT TO  HOSPITALIST      EMERGENCY DEPARTMENT COURSE and DIFFERENTIAL DIAGNOSIS/MDM:   VitalsCeasar Mons:    Filed Vitals:    04/09/14 1805 04/09/14 2031 04/09/14 2047 04/09/14 2102   BP: 149/73 136/69 139/63 120/69   Pulse: 93      Temp: 97.9 ??F (36.6 ??C)      TempSrc: Oral      Resp: 18      Height: 6\' 4"  (1.93 m)      Weight: 160 lb (72.576 kg)      SpO2: 96% 95% 89% 97%       Patient was given the following medications:  Medications   valproic acid (DEPAKENE) capsule 500 mg (not administered)   levetiracetam (KEPPRA) 1000 mg/100 mL IVPB (0 mg Intravenous Stopped 04/09/14 2104)   LORazepam (ATIVAN) injection 1 mg (1 mg Intravenous Given 04/09/14 2137)     Patient has a dural male who presents to use complaining of a seizure.  Patient has a past medical history of seizures and states he has been taking his medication as prescribed.  Patient states today around 2 PM he had a seizure in which he bit his lip.  Patient is small laceration to his anterior lip that does not require suturing.  Upon initial evaluation patient  is neurologically intact and I believe he can follow up outpatient lady with his neurologist.  CBC, CMP, and troponin were unremarkable.  Valproic acid level was low and Keppra level was unknown.  Patient states he has been taking his medication, but the results appear low.  Patient was going to be given a loading dose of Keppra and instructed to continue his outpatient medications.  However patient had a repeat seizure here in the ED that was unwitnessed by myself.  Nurse states seizure lasted for an unknown period of time.  Upon repeat examination patient is not postictal and neurologically intact.  Given patient's persistent seizure here in the ED I believe patient would benefit from admission for further evaluation treatment.  My attending physician spoke with the hospitalist who agreed to admit the patient for further evaluation and treatment.  Patient will be admitted under the care of the hospitalist for further  care and treatment.  No evidence of electrolyte abnormality, ACS, or hypoglycemia.  \\    The patient tolerated their visit well.  They were seen and evaluated by the attending physician, No att. providers found who agreed with the assessment and plan.  The patient and / or the family were informed of the results of any tests, a time was given to answer questions, a plan was proposed and they agreed with plan.        FINAL IMPRESSION      1. Convulsions, unspecified convulsion type (HCC)          DISPOSITION/PLAN   DISPOSITION Admitted    PATIENT REFERRED TO:  No follow-up provider specified.    DISCHARGE MEDICATIONS:  New Prescriptions    No medications on file       DISCONTINUED MEDICATIONS:  Discontinued Medications    No medications on file              (Please note that portions of this note were completed with a voice recognition program.  Efforts were made to edit the dictations but occasionally words are mis-transcribed.)    Verdon Cummins, PA (electronically signed)            Isabella Stalling, PA  04/09/14 2210

## 2014-04-09 NOTE — ED Notes (Signed)
RN walked by patient room and noted patient to be having seizure. Bedside RN notified. This RN stayed in room with pt and witnessed approximately 45 sec of continued seizure activity. Total body convulsions with clenched fist. Patient not responsive to RN speech or touch. Both MD and PA notified after patient safety confirmed.     Kathryne GinSarah Flint, RN  04/09/14 2103

## 2014-04-09 NOTE — ED Notes (Signed)
Pt SpO2 noted to be 89-90%, pt placed on 2L O2/NC.  Pt remains postictal at this time, will monitor.     Domingo DimesKelly Mykela Mewborn, RN  04/09/14 [phone]

## 2014-04-09 NOTE — ED Provider Notes (Addendum)
I have reviewed the history and physical exam with the advanced practice provider.  I independently interviewed the patient and have interpreted all diagnostics except where otherwise stated.  My assessment of the required treatment and disposition for the patient is in agreement with that of the advanced practice provider.      28 year old male presents to emergency department with complaint of seizure.  History of seizures.  No headache or change in mental status.  Did have a postictal period.  Did bite his lip.  No needs for repair.  Was loaded on Keppra as he is subtherapeutic.  Will charged home.  States he has his medications at home and will take them.  Neurologic exam nonfocal.  With normal cranial nerve function strength sensation and reflexes.    EKG interpretation by myself: Sinus rhythm rate 83 axis normal PR and normal QRS narrow ST segments with early repolarization.  This is unchanged from EKG dated 03/29/14.      Patient was planned to be discharged after being reloaded on his Keppra.  He then had another seizure in the emergency department.  After getting reloaded on Keppra.  I do feel he will require to be admitted at this time.  This was discussed with the hospitalist on-call who will accept admission.  Additionally is given another dose of valproic acid.  Plan admission neuro consult further workup and evaluation.  Juel Burrowarl S Estill Llerena, DO  04/09/14 1949    Juel Burrowarl S Bryana Froemming, DO  04/09/14 1950    Juel Burrowarl S Gillian Kluever, DO  04/09/14 2144

## 2014-04-09 NOTE — Progress Notes (Signed)
Admission assessment completed. Pt. Somnolent, post ictal. Answers questions in a whisper. No seizure activity at present time. Seizure pads in place. No resp. Diffuculty. Monitor shows NSR. Call light in reach.

## 2014-04-09 NOTE — Progress Notes (Signed)
Patient arrived to floor from Ed via stretcher. Vital signs stable. Patient oriented to room. Call light and bedside table within reach.Seizure pads in place.  Patient denies further needs at this time. Will continue to monitor

## 2014-04-09 NOTE — H&P (Signed)
[facility]    04/09/2014 9:45 PM    Patient Information:  Roy Weaver is a 28 y.o. male [id number]  PCP:  [name], MD (Tel: None )    Chief complaint:    Chief Complaint   Patient presents with   ??? Seizures     pt arrived via [place].  seizure today and laceration on lip      History of Present Illness:  Roy Weaver is a 28 y.o. male who presented with seizure. Symptom onset was acute for a time period of 6 hour(s). The severity is described as severe. The course of his symptoms over time is intermittent. The symptoms improved with none and worsened with none. The patient's symptom is associated with none.  Patient has hx of seizures and was recently discharged from this facility following management for a seizure episode.  He is pos-ictal and is very sleepy so unable to provide much hx.  Not clear if he has been compliant with his AED but the depakote level was subtherapeutic while the Keppra level was in the lower side of the therapeutic level.      History obtained from patient and electronic medical record (mainly)  Old medical records show he was discharged from this facility about 10 days ago following management for seizure event.       REVIEW OF SYSTEMS:   Patient unable to provide history/review of system due to postictal state.    Past Medical History:   has a past medical history of Seizures (HCC) and Migraine.     Past Surgical History:  Unable to provide this hx due to postictal state.     Medications:  No current facility-administered medications on file prior to encounter.     Current Outpatient Prescriptions on File Prior to Encounter   Medication Sig Dispense Refill   ??? divalproex (DEPAKOTE ER) 500 MG ER tablet Take 1 tablet by mouth 2 times daily 60 tablet 1   ??? levETIRAcetam (KEPPRA) 1000 MG tablet Take 1 tablet by mouth 2 times daily 60 tablet 1   ??? nicotine (NICODERM CQ) 21  MG/24HR Place 1 patch onto the skin every 24 hours. 30 patch 3       Allergies:  No Known Allergies     Social History:   reports that he has been smoking Cigarettes.  He has a 1 pack-year smoking history. He quit smokeless tobacco use about 6 months ago. He reports that he drinks alcohol. He reports that he does not use illicit drugs.     Family History:  family history includes Diabetes in his father.     Physical Exam:  BP 120/69 mmHg   Pulse 93   Temp(Src) 97.9 ??F (36.6 ??C) (Oral)   Resp 18   Ht 6\' 4"  (1.93 m)   Wt 160 lb (72.576 kg)   BMI 19.48 kg/m2   SpO2 97%    General appearance:  Appears comfortable. Well nourished  Eyes: Sclera clear, pupils equal  ENT: Moist mucus membranes, no thrush. Trachea midline.  Cardiovascular: Regular rhythm, normal S1, S2. No murmur, gallop, rub. No edema in lower extremities  Respiratory: Clear to auscultation bilaterally, no wheeze, good inspiratory effort  Gastrointestinal: Abdomen soft, non-tender, not distended, normal bowel sounds  Musculoskeletal: No cyanosis in digits, neck supple  Neurology: Unable to assess due to post-ictal state.  Psychiatry: Appropriate affect. Not agitated  Skin: Warm, dry, normal turgor, no rash  Labs:  CBC:   Lab Results   Component Value Date    WBC 5.9 04/09/2014    RBC 4.65 04/09/2014    HGB 14.2 04/09/2014    HCT 41.8 04/09/2014    MCV 89.9 04/09/2014    MCH 30.4 04/09/2014    MCHC 33.8 04/09/2014    RDW 13.4 04/09/2014    PLT 296 04/09/2014    MPV 7.3 04/09/2014     BMP:    Lab Results   Component Value Date    NA 137 04/09/2014    K 4.6 04/09/2014    CL 99 04/09/2014    CO2 26 04/09/2014    BUN 7 04/09/2014    CREATININE 0.8 04/09/2014    CREATININE 1.0 08/17/2011    CALCIUM 9.3 04/09/2014    GFRAA >60 04/09/2014    GFRAA 157 08/19/2011    LABGLOM >60 04/09/2014    GLUCOSE 104 04/09/2014     Discussed patient's management with the ER Practitioner, Dr Ned Clinesooms    Problem List  Active Problems:    Seizure [facility](HCC)        Assessment/Plan:    1. Seizure event: Likely due to non-compliance with his AED as documented in prior hospitalizations and evidenced by subtherapeutic Depakote level. Resume home meds and consult neurology.  2. Tobacco use: Nicoderm.  3. DVT prophylaxis: Early ambulation    Admit as inpatient. I anticipate hospitalization spanning more than two midnights for investigation and treatment of the above medically necessary diagnoses.      Dvaughn Fickle Jamelle HaringZELUWA Consandra Laske, MD    04/09/2014 9:45 PM

## 2014-04-10 LAB — EKG 12-LEAD
Atrial Rate: 83 {beats}/min
P Axis: 8 degrees
P-R Interval: 174 ms
Q-T Interval: 362 ms
QRS Duration: 82 ms
QTc Calculation (Bazett): 425 ms
R Axis: 77 degrees
T Axis: 39 degrees
Ventricular Rate: 83 {beats}/min

## 2014-04-10 LAB — CBC WITH AUTO DIFFERENTIAL
Basophils %: 1.6 %
Basophils Absolute: 0.1 10*3/uL (ref 0.0–0.2)
Eosinophils %: 4.1 %
Eosinophils Absolute: 0.2 10*3/uL (ref 0.0–0.6)
Hematocrit: 41.8 % (ref 40.5–52.5)
Hemoglobin: 14.2 g/dL (ref 13.5–17.5)
Lymphocytes %: 20.5 %
Lymphocytes Absolute: 1.2 10*3/uL (ref 1.0–5.1)
MCH: 30.4 pg (ref 26.0–34.0)
MCHC: 33.8 g/dL (ref 31.0–36.0)
MCV: 89.9 fL (ref 80.0–100.0)
MPV: 7.3 fL (ref 5.0–10.5)
Monocytes %: 9.6 %
Monocytes Absolute: 0.6 10*3/uL (ref 0.0–1.3)
Neutrophils %: 64.2 %
Neutrophils Absolute: 3.8 10*3/uL (ref 1.7–7.7)
Platelets: 296 10*3/uL (ref 135–450)
RBC: 4.65 M/uL (ref 4.20–5.90)
RDW: 13.4 % (ref 12.4–15.4)
WBC: 5.9 10*3/uL (ref 4.0–11.0)

## 2014-04-10 LAB — LEVETIRACETAM LEVEL: Levetiracetam Lvl: 6.5 ug/mL (ref 6.0–46.0)

## 2014-04-10 MED ORDER — LEVETIRACETAM IN NACL 1000 MG/100ML IV SOLN
1000 MG/100ML | Freq: Once | INTRAVENOUS | Status: AC
Start: 2014-04-10 — End: 2014-04-09
  Administered 2014-04-10: 02:00:00 1000 mg via INTRAVENOUS

## 2014-04-10 MED ORDER — DIVALPROEX SODIUM ER 500 MG PO TB24
500 MG | Freq: Three times a day (TID) | ORAL | Status: DC
Start: 2014-04-10 — End: 2014-04-10
  Administered 2014-04-10: 20:00:00 500 mg via ORAL

## 2014-04-10 MED ORDER — NICOTINE 21 MG/24HR TD PT24
21 MG/24HR | Freq: Every day | TRANSDERMAL | Status: DC
Start: 2014-04-10 — End: 2014-04-10
  Administered 2014-04-10 (×2): 1 via TRANSDERMAL

## 2014-04-10 MED ORDER — LORAZEPAM 2 MG/ML IJ SOLN
2 MG/ML | INTRAMUSCULAR | Status: DC | PRN
Start: 2014-04-10 — End: 2014-04-10

## 2014-04-10 MED ORDER — LORAZEPAM 2 MG/ML IJ SOLN
2 MG/ML | Freq: Once | INTRAMUSCULAR | Status: AC
Start: 2014-04-10 — End: 2014-04-09
  Administered 2014-04-10: 03:00:00 1 mg via INTRAVENOUS

## 2014-04-10 MED ORDER — VALPROIC ACID 250 MG PO CAPS
250 MG | Freq: Once | ORAL | Status: DC
Start: 2014-04-10 — End: 2014-04-10

## 2014-04-10 MED ORDER — DIVALPROEX SODIUM ER 500 MG PO TB24
500 MG | Freq: Two times a day (BID) | ORAL | Status: DC
Start: 2014-04-10 — End: 2014-04-10
  Administered 2014-04-10 (×2): 500 mg via ORAL

## 2014-04-10 MED ORDER — NORMAL SALINE FLUSH 0.9 % IV SOLN
0.9 % | INTRAVENOUS | Status: AC
Start: 2014-04-10 — End: 2014-04-10
  Administered 2014-04-10: 15:00:00 10

## 2014-04-10 MED ORDER — LEVETIRACETAM 500 MG/5ML IV SOLN
500 MG/5ML | Freq: Once | INTRAVENOUS | Status: DC
Start: 2014-04-10 — End: 2014-04-09

## 2014-04-10 MED ORDER — LEVETIRACETAM 500 MG PO TABS
500 MG | Freq: Two times a day (BID) | ORAL | Status: DC
Start: 2014-04-10 — End: 2014-04-10
  Administered 2014-04-10 (×2): 1000 mg via ORAL

## 2014-04-10 MED ORDER — SODIUM CHLORIDE 0.9 % IV BOLUS
0.9 % | Freq: Once | INTRAVENOUS | Status: AC
Start: 2014-04-10 — End: 2014-04-10
  Administered 2014-04-10: 16:00:00 500 mL via INTRAVENOUS

## 2014-04-10 MED ORDER — DIVALPROEX SODIUM ER 500 MG PO TB24
500 MG | ORAL_TABLET | Freq: Three times a day (TID) | ORAL | Status: DC
Start: 2014-04-10 — End: 2014-05-19

## 2014-04-10 MED FILL — LEVETIRACETAM IN NACL 1000 MG/100ML IV SOLN: 1000 MG/100ML | INTRAVENOUS | Qty: 100

## 2014-04-10 MED FILL — LEVETIRACETAM 500 MG PO TABS: 500 MG | ORAL | Qty: 2

## 2014-04-10 MED FILL — NORMAL SALINE FLUSH 0.9 % IV SOLN: 0.9 % | INTRAVENOUS | Qty: 10

## 2014-04-10 MED FILL — DEPAKOTE ER 500 MG PO TB24: 500 MG | ORAL | Qty: 1

## 2014-04-10 MED FILL — SODIUM CHLORIDE 0.9 % IV SOLN: 0.9 % | INTRAVENOUS | Qty: 500

## 2014-04-10 MED FILL — NICOTINE 21 MG/24HR TD PT24: 21 MG/24HR | TRANSDERMAL | Qty: 1

## 2014-04-10 MED FILL — LORAZEPAM 2 MG/ML IJ SOLN: 2 MG/ML | INTRAMUSCULAR | Qty: 1

## 2014-04-10 MED FILL — VALPROIC ACID 250 MG PO CAPS: 250 MG | ORAL | Qty: 2

## 2014-04-10 NOTE — Other (Addendum)
Patient Acct Nbr:  [id number]  Primary AUTH/CERT:    Primary Insurance Company Name:   [employer]/HMO  Primary Insurance Plan Name:  [facility]/CAID HMO  Primary Insurance Group Number:  [id number]  Primary Insurance Plan Type: [employer]  Primary Insurance Policy Number:  [id number]

## 2014-04-10 NOTE — Progress Notes (Signed)
Pt stated to this RN that he had gone out with a friend and drank alcohol a few days ago and forgot to take his seizure medication until later that evening when he returned.  After he took his medication he had a seizure that night.  Pt states that he thinks the combination of alcohol and forgetting to take his medication on time probably contributed to his seizure, as this is the second time this has happened in the last few months.  Will continue to monitor.  Trula OreJennifer Ellamae Lybeck

## 2014-04-10 NOTE — Progress Notes (Signed)
Pt discharged.  Pt's brother here to pick him up.  Raynelle FanningJulie, PCA escorted pt to front lobby.  Trula OreJennifer Gleb Mcguire

## 2014-04-10 NOTE — Progress Notes (Signed)
500 cc bolus completed.  Roy OreJennifer Berdie Malter

## 2014-04-10 NOTE — Progress Notes (Signed)
Pt wanted to fill his script here at out pt pharmacy.  Faxed down to them, they ran the insurance benefits and his insurance/ medicaid is through [employer] which doesn't have a Community education officercontract with [employer].  They stated that the script would be more expensive here.  Notified pt of this, he will take his scripts to another pharmacy.  Trula OreJennifer Nero Sawatzky

## 2014-04-10 NOTE — Discharge Instructions (Signed)
Your information:  Name: Roy Weaver  DOB: 08-09-1985    Your instructions:    Follow up with NISAR FATIMA HAQ, MD with in one week for follow up of new recent seizure activity.    What to do after you leave the hospital:    Recommended diet: regular diet    Recommended activity: activity as tolerated        The following personal items were collected during your admission and were returned to you:    Valuables  Dentures: None  Vision - Corrective Lenses: Glasses  Hearing Aid: None  Jewelry: None  Body Piercings Removed: N/A  Clothing: Footwear, Pants, Shirt, Socks, Undergarments (Comment)  Were All Patient Medications Collected?: Not Applicable  Other Valuables: Cell phone, Wallet  Valuables Given To: Patient    Information obtained by:  By signing below, I understand that if any problems occur once I leave the hospital I am to contact NISAR FATIMA HAQ, MD.  I understand and acknowledge receipt of the instructions indicated above.

## 2014-04-10 NOTE — Progress Notes (Signed)
HR up to 130's, 140's when pt ambulated to the bathroom.  About 20 mins later back down to 103.  Pt denied any signs/symptoms of distress, currently asymptomatic.  Call light with in reach, will continue to monitor.  Trula OreJennifer Jan Olano

## 2014-04-10 NOTE — Progress Notes (Signed)
Assessment completed, see doc flowsheets.  Call light with in reach, will continue to monitor.  Melinda Gwinner

## 2014-04-10 NOTE — Discharge Summary (Signed)
[facility] DISCHARGE SUMMARY    Patient Demographics    Patient: Roy Weaver  Date of Birth:  [birth year]  MRN:  [id number]   Code: Full Code    Primary care provider. NISAR FATIMA HAQ, MD  (Tel: None)    Admit date: 04/09/2014    Discharge date 04/10/2014  Note Date: 04/10/2014     Reason for Hospitalization.   Chief Complaint   Patient presents with   ??? Seizures     pt arrived via [place].  seizure today and laceration on lip         Significant Findings.   Active Problems:    Seizure (HCC)       Problems and results from this hospitalization that need follow up.  1. Seizure disorder/ compliance needs follow up  2. Needs substance abuse counseling     Significant test results and incidental findings.  1. Valproic acid  level low at 21    Invasive procedures and treatments.   1. None     Problem-based Hospital Course.  The patient was admitted after having a grand mal seizure.  He has long standing history of non compliance with his medications and follow up with PCP/ neurology.   He admitted to going out and " getting drunk" with his friends many times per week.  Prior to admission he was intoxicated and forgot to take his medications.  He also has long standing history of daily marijuana use.  He was admitted overnight and given supportive care.  On the following day he was awake and oriented and eager to be released home.    He was asked to follow up with neurology as outpatient.  He was given a script for his Depakote.  This dose was increased on his previous admission to 1500 mg per day.        Consults.  IP CONSULT TO HOSPITALIST  IP CONSULT TO NEUROLOGY    Physical examination on discharge day.   BP 116/69 mmHg   Pulse 141   Temp(Src) 99 ??F (37.2 ??C) (Oral)   Resp 18   Ht 6\' 4"  (1.93 m)   Wt 178 lb 12.7 oz (81.1 kg)   BMI 21.77 kg/m2   SpO2 98%  General appearance.  Alert. Looks comfortable.  HEENT. Sclera clear.  Moist mucus membranes.  Cardiovascular. Regular rate and rhythm, normal S1, S2. No murmur.   Respiratory. Not using accessory muscles.Clear to auscultation bilaterally, no wheeze.  Gastrointestinal. Abdomen soft, non-tender, not distended, normal bowel sounds  Neurology. Facial symmetry. No speech deficits. Moving all extremities equally.  Extremities. No edema in lower extremities.  Skin. Warm, dry, normal turgor    Medication instructions provided to patient at discharge.     Medication List      CHANGE how you take these medications          divalproex 500 MG ER tablet   Commonly known as:  DEPAKOTE ER   Take 1 tablet by mouth 3 times daily   What changed:  when to take this         CONTINUE taking these medications          levETIRAcetam 1000 MG tablet   Commonly known as:  KEPPRA   Take 1 tablet by mouth 2 times daily       nicotine 21 MG/24HR   Commonly known as:  NICODERM CQ   Place 1 patch onto the skin every 24 hours.  Where to Get Your Medications     These are the prescriptions that you need to pick up.         You may get the following medications from any pharmacy   -  divalproex 500 MG ER tablet                      Discharge recommendations given to patient.  Follow Up.  in 1 week   Disposition.  home  Activity. As tolerated   Diet: DIET GENERAL;      Code status:  Full Code     Spent > 30  minutes in discharge process.    Signed:  Joyclyn Plazola, CNP     04/10/2014 11:02 AM

## 2014-04-10 NOTE — Progress Notes (Signed)
No phone number listed for NISAR FATIMA HAQ, MD.  Unable to make follow up appt for pt.  He was supposed to have an appointment with her today, but had to cancel due to being in the hospital.  He states he will follow up with her as soon as possible with in the next week.  Trula OreJennifer Jasilyn Holderman

## 2014-04-10 NOTE — Progress Notes (Signed)
Dr Thayer Ohmhris is at bed side to see pt.

## 2014-05-18 DIAGNOSIS — R569 Unspecified convulsions: Secondary | ICD-10-CM

## 2014-05-18 NOTE — ED Notes (Signed)
Pt. with known history of seizures. has had 3rd witnessed seizure today. was unresponsive at the scene prior to squad arrival. Pt. currently drowsy and oriented x4, doesnt remember what happened. complaint with keppra    Yehuda Maoysha C Taylor-Ramsey, RN  05/18/14 2050

## 2014-05-18 NOTE — ED Notes (Signed)
EKG completed by RN and shown to MD.       Roy CurioBeth Marie Kayra Crowell, RN  05/18/14 2100

## 2014-05-18 NOTE — ED Notes (Signed)
Pt. Had 1 witnessed seizure in ED lasting approximately 1 minute. MD to the bedside, 2mg  of ativan administered. Pt. Placed on 6L NC    Yehuda Maoysha C Taylor-Ramsey, RN  05/18/14 2131

## 2014-05-18 NOTE — ED Notes (Signed)
Seizure precautions in place. Pt placed on continuous pulse oximetry and telemetry monitoring. Pt placed on cycling blood pressure. Fall risk precautions in place, call light in reach, bed side table within reach, bed alarm on, will continue to monitor.        Yehuda Maoysha C Taylor-Ramsey, RN  05/18/14 2132

## 2014-05-18 NOTE — ED Notes (Signed)
Bed: 28-01  Expected date:   Expected time:   Means of arrival:   Comments:  squad

## 2014-05-18 NOTE — ED Notes (Signed)
PT lying on stretcher with side rails up x2 and seizure pads in place. Pt's eyes closed and respirations even and easy. Vitals as charted will continue to monitor.    Georgana CurioBeth Marie Lonn Im, RN  05/18/14 [phone]

## 2014-05-18 NOTE — ED Notes (Signed)
Pt. Straight cathed for urine. Specimen sent down to the lab    Yehuda Maoysha C Taylor-Ramsey, RN  05/18/14 2245

## 2014-05-19 ENCOUNTER — Inpatient Hospital Stay: Admit: 2014-05-19 | Discharge: 2014-05-19 | Attending: Emergency Medicine

## 2014-05-19 DIAGNOSIS — G40909 Epilepsy, unspecified, not intractable, without status epilepticus: Secondary | ICD-10-CM

## 2014-05-19 LAB — EKG 12-LEAD
Atrial Rate: 101 {beats}/min
P Axis: 62 degrees
P-R Interval: 168 ms
Q-T Interval: 344 ms
QRS Duration: 84 ms
QTc Calculation (Bazett): 446 ms
R Axis: 92 degrees
T Axis: 71 degrees
Ventricular Rate: 101 {beats}/min

## 2014-05-19 LAB — CBC WITH AUTO DIFFERENTIAL
Basophils %: 0.5 %
Basophils Absolute: 0 10*3/uL (ref 0.0–0.2)
Eosinophils %: 1.5 %
Eosinophils Absolute: 0.1 10*3/uL (ref 0.0–0.6)
Hematocrit: 42.8 % (ref 40.5–52.5)
Hemoglobin: 14.3 g/dL (ref 13.5–17.5)
Lymphocytes %: 11.9 %
Lymphocytes Absolute: 1 10*3/uL (ref 1.0–5.1)
MCH: 30.2 pg (ref 26.0–34.0)
MCHC: 33.4 g/dL (ref 31.0–36.0)
MCV: 90.4 fL (ref 80.0–100.0)
MPV: 7.5 fL (ref 5.0–10.5)
Monocytes %: 5.6 %
Monocytes Absolute: 0.5 10*3/uL (ref 0.0–1.3)
Neutrophils %: 80.5 %
Neutrophils Absolute: 6.6 10*3/uL (ref 1.7–7.7)
Platelets: 289 10*3/uL (ref 135–450)
RBC: 4.73 M/uL (ref 4.20–5.90)
RDW: 13.2 % (ref 12.4–15.4)
WBC: 8.2 10*3/uL (ref 4.0–11.0)

## 2014-05-19 LAB — URINE DRUG SCREEN
Amphetamine Screen, Urine: NEGATIVE
Barbiturate Screen, Ur: NEGATIVE (ref <200)
Benzodiazepine Screen, Urine: NEGATIVE (ref <200)
Cannabinoid Scrn, Ur: POSITIVE — AB (ref <50)
Cocaine Metabolite Screen, Urine: NEGATIVE (ref <300)
Methadone Screen, Urine: NEGATIVE (ref <300)
Opiate Scrn, Ur: POSITIVE — AB (ref <300)
PCP Screen, Urine: NEGATIVE (ref <25)
Propoxyphene Scrn, Ur: NEGATIVE (ref <300)
pH, UA: 5

## 2014-05-19 LAB — URINALYSIS WITH REFLEX TO CULTURE
Bilirubin Urine: NEGATIVE
Glucose, Ur: NEGATIVE mg/dL
Ketones, Urine: NEGATIVE mg/dL
Leukocyte Esterase, Urine: NEGATIVE
Nitrite, Urine: NEGATIVE
Protein, UA: 100 mg/dL — AB
Specific Gravity, UA: 1.021 (ref 1.005–1.030)
Urobilinogen, Urine: 0.2 E.U./dL (ref <2.0)
pH, UA: 5 (ref 5.0–8.0)

## 2014-05-19 LAB — COMPREHENSIVE METABOLIC PANEL
ALT: 10 U/L (ref 10–40)
AST: 19 U/L (ref 15–37)
Albumin/Globulin Ratio: 1.2 (ref 1.1–2.2)
Albumin: 3.8 g/dL (ref 3.4–5.0)
Alkaline Phosphatase: 95 U/L (ref 40–129)
Anion Gap: 16 (ref 3–16)
BUN: 9 mg/dL (ref 7–20)
CO2: 19 mmol/L — ABNORMAL LOW (ref 21–32)
Calcium: 8.4 mg/dL (ref 8.3–10.6)
Chloride: 103 mmol/L (ref 99–110)
Creatinine: 0.9 mg/dL (ref 0.9–1.3)
GFR African American: >60 (ref >60)
GFR Non-African American: >60 (ref >60)
Globulin: 3.2 g/dL
Glucose: 94 mg/dL (ref 70–99)
Potassium: 4.1 mmol/L (ref 3.5–5.1)
Sodium: 138 mmol/L (ref 136–145)
Total Bilirubin: <0.2 mg/dL (ref 0.0–1.0)
Total Protein: 7 g/dL (ref 6.4–8.2)

## 2014-05-19 LAB — MICROSCOPIC URINALYSIS
Epithelial Cells, UA: 2 /HPF (ref 0–5)
Hyaline Casts, UA: 11 /HPF — ABNORMAL HIGH (ref 0–8)
RBC, UA: 1 /HPF (ref 0–4)
WBC, UA: 9 /HPF — ABNORMAL HIGH (ref 0–5)

## 2014-05-19 LAB — MAGNESIUM: Magnesium: 2.5 mg/dL — ABNORMAL HIGH (ref 1.80–2.40)

## 2014-05-19 MED ORDER — LORAZEPAM 2 MG/ML IJ SOLN
2 MG/ML | INTRAMUSCULAR | Status: AC
Start: 2014-05-19 — End: 2014-05-18
  Administered 2014-05-19: 03:00:00 2 via INTRAVENOUS

## 2014-05-19 MED ORDER — LEVETIRACETAM 500 MG PO TABS
500 MG | Freq: Once | ORAL | Status: AC
Start: 2014-05-19 — End: 2014-05-19
  Administered 2014-05-19: 12:00:00 1000 mg via ORAL

## 2014-05-19 MED ORDER — LEVETIRACETAM IN NACL 1000 MG/100ML IV SOLN
1000 MG/100ML | Freq: Once | INTRAVENOUS | Status: AC
Start: 2014-05-19 — End: 2014-05-18
  Administered 2014-05-19: 03:00:00 1000 mg via INTRAVENOUS

## 2014-05-19 MED ORDER — SODIUM CHLORIDE 0.9 % IV BOLUS
0.9 % | Freq: Once | INTRAVENOUS | Status: AC
Start: 2014-05-19 — End: 2014-05-19
  Administered 2014-05-19: 03:00:00 500 mL via INTRAVENOUS

## 2014-05-19 MED ORDER — LORAZEPAM 2 MG/ML IJ SOLN
2 MG/ML | Freq: Once | INTRAMUSCULAR | Status: AC
Start: 2014-05-19 — End: 2014-05-18

## 2014-05-19 MED ORDER — LEVETIRACETAM 500 MG/5ML IV SOLN
500 MG/5ML | Freq: Once | INTRAVENOUS | Status: DC
Start: 2014-05-19 — End: 2014-05-18

## 2014-05-19 MED ORDER — LEVETIRACETAM 1000 MG PO TABS
1000 MG | ORAL_TABLET | Freq: Two times a day (BID) | ORAL | Status: DC
Start: 2014-05-19 — End: 2014-08-07

## 2014-05-19 MED FILL — SODIUM CHLORIDE 0.9 % IV SOLN: 0.9 % | INTRAVENOUS | Qty: 500

## 2014-05-19 MED FILL — LEVETIRACETAM 500 MG PO TABS: 500 MG | ORAL | Qty: 2

## 2014-05-19 MED FILL — LORAZEPAM 2 MG/ML IJ SOLN: 2 MG/ML | INTRAMUSCULAR | Qty: 1

## 2014-05-19 MED FILL — LEVETIRACETAM IN NACL 1000 MG/100ML IV SOLN: 1000 MG/100ML | INTRAVENOUS | Qty: 100

## 2014-05-19 NOTE — ED Notes (Signed)
Pt woke at this time when this RN entered the room.  Pt states he feels better now than when he arrived.  Pt states he does need to call for a ride home.      Lolly MustacheAmanda L Jace Dowe, RN  05/19/14 [phone]

## 2014-05-19 NOTE — ED Notes (Signed)
Seizures pads in place.     Harland DingwallSamantha Marie Selda Jalbert, RN  05/19/14 2216

## 2014-05-19 NOTE — ED Notes (Signed)
IV inserted, labs drawn. Medication and fluids given.     Harland DingwallSamantha Marie Mahasin Riviere, RN  05/19/14 [phone]

## 2014-05-19 NOTE — ED Notes (Signed)
Pt appears to be sleeping, lights out, eyes closed, even respirations    Lolly Mustachemanda L Ashleynicole Mcclees, RN  05/19/14 [phone]

## 2014-05-19 NOTE — ED Notes (Signed)
Bed: 21-01  Expected date:   Expected time:   Means of arrival:   Comments:  squad

## 2014-05-19 NOTE — ED Provider Notes (Signed)
[facility]ENCY dEPARTMENT eNCOUnter      Pt Name: Roy Weaver  MRN: [id number]  Birthdate 1986-01-21  Date of evaluation: 05/18/2014  Provider: Audrie Gallusaymond Hamlin Devine, MD    CHIEF COMPLAINT       Chief Complaint   Patient presents with   ??? Seizures     Pt. with known history of seizures. has had 3rd witnessed seizure today. was unresponsive at the scene prior to squad arrival. Pt. currently drowsy and oriented x4, doesnt remember what happened. complaint with keppra         HISTORY OF PRESENT ILLNESS   (Location/Symptom, Timing/Onset, Context/Setting, Quality, Duration, Modifying Factors, Severity)  Note limiting factors.   Roy Weaver is a 28 y.o. male who presents to the emergency department after multiple seizures at home and then one seizure in the emergency department. 911 called after patient's roommate saw him seize on the couch. Patient postictal on initial presentation. Patient takes Keppra for seizures and says he has been compliant. Initially patient unable to give a story but later said that he smoked marijuana given to him by a friend and shortly after had several seizures. On reevaluation several hours later patient is asymptomatic and has been given 2 doses of Keppra 1 IV and 1P [place]. The patient has a ride to take him home.     HPI    Nursing Notes were reviewed.    REVIEW OF SYSTEMS    (2-9 systems for level 4, 10 or more for level 5)     Review of Systems   Constitutional: Negative for fever and chills.   HENT: Negative for rhinorrhea and sore throat.    Respiratory: Negative.  Negative for cough, chest tightness and shortness of breath.    Cardiovascular: Negative.  Negative for chest pain and leg swelling.   Gastrointestinal: Negative.  Negative for nausea, vomiting, abdominal pain, diarrhea and constipation.   Genitourinary: Negative for dysuria and urgency.   Musculoskeletal: Negative.  Negative for gait problem.   Skin: Negative.  Negative for rash.    Allergic/Immunologic: Negative for environmental allergies and food allergies.   Neurological: Positive for seizures. Negative for weakness and numbness.       Except as noted above the remainder of the review of systems was reviewed and negative.       PAST MEDICAL HISTORY     Past Medical History   Diagnosis Date   ??? Seizures (HCC)    ??? Migraine    ??? Hypertension    ??? Pneumonia    ??? Psychiatric problem          SURGICAL HISTORY     No past surgical history on file.      CURRENT MEDICATIONS       Discharge Medication List as of 05/19/2014  6:25 AM      CONTINUE these medications which have NOT CHANGED    Details   nicotine (NICODERM CQ) 21 MG/24HR Place 1 patch onto the skin every 24 hours., Disp-30 patch, R-3             ALLERGIES     Review of patient's allergies indicates no known allergies.    FAMILY HISTORY       Family History   Problem Relation Age of Onset   ??? Diabetes Father           SOCIAL HISTORY       History     Social History   ???  Marital Status: Single     Spouse Name: N/A     Number of Children: 0   ??? Years of Education: 5814     Social History Main Topics   ??? Smoking status: Current Every Day Smoker -- 0.50 packs/day for 2 years     Types: Cigarettes   ??? Smokeless tobacco: Former NeurosurgeonUser     Quit date: 10/04/2013   ??? Alcohol Use: 0.0 oz/week     0 Not specified per week   ??? Drug Use: No      Comment: stopped smoking marijuana    ??? Sexual Activity: Not on file     Other Topics Concern   ??? Not on file     Social History Narrative    ** Merged History Encounter **            SCREENINGS             PHYSICAL EXAM    (up to 7 for level 4, 8 or more for level 5)   ED Triage Vitals   BP Temp Temp Source Pulse Resp SpO2 Height Weight   05/18/14 2047 05/18/14 2047 05/18/14 2047 05/18/14 2047 05/18/14 2048 05/18/14 2048 05/18/14 2048 05/18/14 2048   140/67 mmHg 99 ??F (37.2 ??C) Oral 108 18 100 % 6\' 4"  (1.93 m) 165 lb (74.844 kg)       Physical Exam   Constitutional: He appears well-developed and well-nourished.  No distress.   Well Appearing   HENT:   Head: Normocephalic and atraumatic.   Eyes: Right eye exhibits no discharge. Left eye exhibits no discharge. No scleral icterus.   Neck: Normal range of motion. Neck supple.   No midline cervical tenderness   Cardiovascular: Normal rate, regular rhythm and normal heart sounds.    Pulmonary/Chest: Effort normal and breath sounds normal. No respiratory distress. He has no wheezes. He has no rales. He exhibits no tenderness.   Abdominal: Soft. Bowel sounds are normal. He exhibits no distension and no mass. There is no tenderness. There is no rebound and no guarding.   Non-tender   Musculoskeletal: He exhibits no edema or tenderness.   Neurological:   No outward signs of trauma. Post ictal on exam. No seize at the hospital but cannot explain his situation. Members is going to sleep on the couch.   Skin: Skin is warm and dry. No rash noted. He is not diaphoretic. No erythema.   Psychiatric: He has a normal mood and affect.   Nursing note and vitals reviewed.      DIAGNOSTIC RESULTS     EKG: All EKG's are interpreted by the Emergency Department Physician who either signs or Co-signs this chart in the absence of a cardiologist.        RADIOLOGY:   Non-plain film images such as CT, Ultrasound and MRI are read by the radiologist. Plain radiographic images are visualized and preliminarily interpreted by the emergency physician with the below findings:        Interpretation per the Radiologist below, if available at the time of this note:           ED BEDSIDE ULTRASOUND:   Performed by ED Physician - none    LABS:  Labs Reviewed   URINE RT REFLEX TO CULTURE - Abnormal; Notable for the following:     Blood, Urine TRACE (*)     Protein, UA 100 (*)     All other components within normal limits   COMPREHENSIVE METABOLIC  PANEL - Abnormal; Notable for the following:     CO2 19 (*)     All other components within normal limits   MAGNESIUM - Abnormal; Notable for the following:     Magnesium  2.50 (*)     All other components within normal limits   URINE DRUG SCREEN - Abnormal; Notable for the following:     Cannabinoid Scrn, Ur POSITIVE (*)     Opiate Scrn, Ur POSITIVE (*)     All other components within normal limits   MICROSCOPIC URINALYSIS - Abnormal; Notable for the following:     Hyaline Casts, UA 11 (*)     WBC, UA 9 (*)     All other components within normal limits   URINE CULTURE   CBC WITH AUTO DIFFERENTIAL       All other labs were within normal range or not returned as of this dictation.    EMERGENCY DEPARTMENT COURSE and DIFFERENTIAL DIAGNOSIS/MDM:   VitalsCeasar Mons Vitals:    05/19/14 0417 05/19/14 0447 05/19/14 0532 05/19/14 0617   BP: 133/58 133/70 136/65 106/64   Pulse: 75 77 73 83   Temp:       TempSrc:       Resp: 18 18 17 16    Height:       Weight:       SpO2: 98% 95% 95%            MDM  Number of Diagnoses or Management Options  Convulsions, unspecified convulsion type [facility]):   Polysubstance abuse:   Seizure disorder [facility]):   Diagnosis management comments: Patient experienced 5 minutes seizure while in the department. No evidence of aspiration. Given Ativan for seizure prophylaxis. Keppra started shortly after that. Patient observed in the ED for 6 hours without further seizure. He reported that he smoked marijuana given to him by a friend and thinks that may have caused him to have a seizure. Patient has been compliant with his Keppra and stable on his dosage for some time without any recent seizures other than today.      CRITICAL CARE TIME   Total Critical Care time was zero minutes, excluding separately reportable procedures.  There was a high probability of clinically significant/life threatening deterioration in the patient's condition which required my urgent intervention.      CONSULTS:  None    PROCEDURES:  Unless otherwise noted below, none     Procedures    FINAL IMPRESSION      1. Convulsions, unspecified convulsion type (HCC)    2. Seizure disorder (HCC)    3.  Polysubstance abuse          DISPOSITION/PLAN   DISPOSITION Decision to Discharge    PATIENT REFERRED TO:  Salvadore Farber, MD  [address]  [place] [address]  [phone]    Schedule an appointment as soon as possible for a visit in 2 days  As needed      DISCHARGE MEDICATIONS:  Discharge Medication List as of 05/19/2014  6:25 AM             (Comment: Please note this report has been produced using speech recognition software and may contain errors related to that system including errors in grammar, punctuation, and spelling, as well as words and phrases that may be inappropriate. If there are any questions or concerns please feel free to contact the dictating physician for clarification.)    Audrie Gallus, MD (electronically signed)  Attending Emergency Physician          Audrie Gallus, MD  05/19/14 [phone]

## 2014-05-19 NOTE — ED Notes (Signed)
Pt has no requests at this time, pt denies pain level     Roy Mustachemanda L Chynna Buerkle, RN  05/19/14 [phone]

## 2014-05-19 NOTE — ED Provider Notes (Signed)
[facility] ED  eMERGENCY dEPARTMENT eNCOUnter      Pt Name: Roy Weaver  MRN: [id number]  Birthdate [birth year]  Date of evaluation: 05/19/2014  Provider: Audrie Gallus, MD    CHIEF COMPLAINT       Chief Complaint   Patient presents with   ??? Altered Mental Status     Pt in by sqaud. Pt was here yesterday and had seizure medications changed. Sts he feels 'off and tired.'  No seizures today per pt.          HISTORY OF PRESENT ILLNESS   (Location/Symptom, Timing/Onset, Context/Setting, Quality, Duration, Modifying Factors, Severity)  Note limiting factors.   Roy Weaver is a 28 y.o. male who presents to the emergency department with altered mental status. Patient said he just fell asleep in the car after eating and denies seizure but patient has history of seizure disorder and was in the emergency department with 3 seizures yesterday. Yesterday patient admitted to smoking weed heavily and then having multiple seizures after that. Patient had one seizure in the emergency department yesterday. [place] Spaniel he has had no seizures today. Talked the patient's brother at his request and the brother confirmed that today he has not had any seizures but he did pass out in the car and had a very hard time waking him up consistent with some sort of substance abuse. Brother said his behavior has become increasingly concerning at home. His brother found several syringes in his room. Patient was positive for opiates yesterday on drug screen but denied using heroin or other opiates. Patient told me yesterday that he only takes Keppra but his brother confirmed that he is supposed to be taking both Keppra and Depakote. Patient said he had enough Keppra at home but told me that today he had not taken her 2nd dose because he didn't go to the pharmacy yet picked it up. Not taking his home seizure medications and also using the very least marijuana daily and does not heroin.Marland Kitchen     HPI    Nursing Notes were  reviewed.    REVIEW OF SYSTEMS    (2-9 systems for level 4, 10 or more for level 5)     Review of Systems   Constitutional: Negative for fever and chills.   HENT: Negative for rhinorrhea and sore throat.    Respiratory: Negative.  Negative for cough, chest tightness and shortness of breath.    Cardiovascular: Negative.  Negative for chest pain and leg swelling.   Gastrointestinal: Negative.  Negative for nausea, vomiting, abdominal pain, diarrhea and constipation.   Genitourinary: Negative for dysuria and urgency.   Musculoskeletal: Negative.  Negative for gait problem.   Skin: Negative.  Negative for rash.   Allergic/Immunologic: Negative for environmental allergies and food allergies.   Neurological: Positive for seizures and syncope. Negative for weakness and numbness.       Except as noted above the remainder of the review of systems was reviewed and negative.       PAST MEDICAL HISTORY     Past Medical History   Diagnosis Date   ??? Seizures (HCC)    ??? Migraine    ??? Hypertension    ??? Pneumonia    ??? Psychiatric problem          SURGICAL HISTORY     History reviewed. No pertinent past surgical history.      CURRENT MEDICATIONS       Discharge Medication List as of 05/20/2014  3:35 AM      CONTINUE these medications which have NOT CHANGED    Details   levETIRAcetam (KEPPRA) 1000 MG tablet Take 1 tablet by mouth 2 times daily, Disp-60 tablet, R-1      nicotine (NICODERM CQ) 21 MG/24HR Place 1 patch onto the skin every 24 hours., Disp-30 patch, R-3             ALLERGIES     Review of patient's allergies indicates no known allergies.    FAMILY HISTORY       Family History   Problem Relation Age of Onset   ??? Diabetes Father           SOCIAL HISTORY       History     Social History   ??? Marital Status: Single     Spouse Name: N/A     Number of Children: 0   ??? Years of Education: 32     Social History Main Topics   ??? Smoking status: Current Every Day Smoker -- 0.50 packs/day for 2 years     Types: Cigarettes   ??? Smokeless  tobacco: Former Neurosurgeon     Quit date: 10/04/2013   ??? Alcohol Use: 0.0 oz/week     0 Not specified per week   ??? Drug Use: No      Comment: stopped smoking marijuana    ??? Sexual Activity: None     Other Topics Concern   ??? None     Social History Narrative    ** Merged History Encounter **            SCREENINGS             PHYSICAL EXAM    (up to 7 for level 4, 8 or more for level 5)   ED Triage Vitals   BP Temp Temp Source Pulse Resp SpO2 Height Weight   05/19/14 2204 05/19/14 2204 05/19/14 2204 05/19/14 2204 05/19/14 2204 05/19/14 2204 05/19/14 2204 05/19/14 2204   146/89 mmHg 98.8 ??F (37.1 ??C) Oral 101 16 94 % 6\' 4"  (1.93 m) 165 lb (74.844 kg)       Physical Exam   Constitutional: He is oriented to person, place, and time. He appears well-developed and well-nourished. No distress.   Well Appearing   HENT:   Head: Normocephalic and atraumatic.   Mouth/Throat: No oropharyngeal exudate.   Eyes: Pupils are equal, round, and reactive to light. Right eye exhibits no discharge. Left eye exhibits no discharge. No scleral icterus.   Neck: Normal range of motion. Neck supple.   Cardiovascular: Normal rate, regular rhythm and normal heart sounds.    Pulmonary/Chest: Effort normal and breath sounds normal. No respiratory distress. He has no wheezes. He has no rales. He exhibits no tenderness.   Abdominal: Soft. Bowel sounds are normal. He exhibits no distension. There is no tenderness. There is no rebound and no guarding.   Non-tender   Lymphadenopathy:     He has no cervical adenopathy.   Neurological: He is alert and oriented to person, place, and time. No cranial nerve deficit. Coordination normal.   Skin: Skin is warm and dry.   Psychiatric: He has a normal mood and affect. His behavior is normal. Judgment and thought content normal.   Nursing note and vitals reviewed.      DIAGNOSTIC RESULTS     EKG: All EKG's are interpreted by the Emergency Department Physician who either signs or Co-signs this chart in the  absence of a  cardiologist.        RADIOLOGY:   Non-plain film images such as CT, Ultrasound and MRI are read by the radiologist. Plain radiographic images are visualized and preliminarily interpreted by the emergency physician with the below findings:        Interpretation per the Radiologist below, if available at the time of this note:           ED BEDSIDE ULTRASOUND:   Performed by ED Physician - none    LABS:  Labs Reviewed   VALPROIC ACID LEVEL, TOTAL - Abnormal; Notable for the following:     Valproic Acid Lvl <2.8 (*)     All other components within normal limits   COMPREHENSIVE METABOLIC PANEL - Abnormal; Notable for the following:     Chloride 98 (*)     Glucose 110 (*)     CREATININE 0.6 (*)     All other components within normal limits   CK - Abnormal; Notable for the following:     Total CK 717 (*)     All other components within normal limits   CBC WITH AUTO DIFFERENTIAL   URINE RT REFLEX TO CULTURE       All other labs were within normal range or not returned as of this dictation.    EMERGENCY DEPARTMENT COURSE and DIFFERENTIAL DIAGNOSIS/MDM:   Vitals:    Filed Vitals:    05/20/14 0201 05/20/14 0231 05/20/14 0301 05/20/14 0331   BP: 130/73 121/68 129/65 148/56   Pulse:       Temp:       TempSrc:       Resp:       Height:       Weight:       SpO2:               MDM  Number of Diagnoses or Management Options  Seizure disorder [facility](HCC):   Substance abuse:   Diagnosis management comments: Lengthy discussion with patient about substance abuse and worsening seizures. Patient has had extensive workup for seizures and thinks he only needs to be on Keppra. As both Keppra and Depakote at home. Patient has not seen his neurologist in some time I gave him several options for neurologist to see in the area and [facility] He filled a prescription for Keppra and nose to take thousand milligrams 2 times daily.    Patient Progress  Patient progress: improved      CRITICAL CARE TIME   Total Critical Care time was zero  minutes, excluding separately reportable procedures.  There was a high probability of clinically significant/life threatening deterioration in the patient's condition which required my urgent intervention.      CONSULTS:  None    PROCEDURES:  Unless otherwise noted below, none     Procedures    FINAL IMPRESSION      1. Seizure disorder (HCC)    2. Substance abuse          DISPOSITION/PLAN   DISPOSITION Decision to Discharge    PATIENT REFERRED TO:  Aurea GraffJoseph Broderick, MD  [address]  [place] [id number]  [phone]    Schedule an appointment as soon as possible for a visit in 2 days  As needed      DISCHARGE MEDICATIONS:  Discharge Medication List as of 05/20/2014  3:35 AM             (Comment: Please note this report has  been produced using speech recognition software and may contain errors related to that system including errors in grammar, punctuation, and spelling, as well as words and phrases that may be inappropriate. If there are any questions or concerns please feel free to contact the dictating physician for clarification.)    Audrie Gallusaymond Ermina Oberman, MD (electronically signed)  Attending Emergency Physician          Audrie Gallusaymond Harshil Cavallaro, MD  05/20/14 [phone]

## 2014-05-20 ENCOUNTER — Inpatient Hospital Stay: Admit: 2014-05-20 | Discharge: 2014-05-20 | Attending: Emergency Medicine

## 2014-05-20 LAB — COMPREHENSIVE METABOLIC PANEL
ALT: 10 U/L (ref 10–40)
AST: 32 U/L (ref 15–37)
Albumin/Globulin Ratio: 1.1 (ref 1.1–2.2)
Albumin: 4.1 g/dL (ref 3.4–5.0)
Alkaline Phosphatase: 91 U/L (ref 40–129)
Anion Gap: 16 (ref 3–16)
BUN: 8 mg/dL (ref 7–20)
CO2: 22 mmol/L (ref 21–32)
Calcium: 8.7 mg/dL (ref 8.3–10.6)
Chloride: 98 mmol/L — ABNORMAL LOW (ref 99–110)
Creatinine: 0.6 mg/dL — ABNORMAL LOW (ref 0.9–1.3)
GFR African American: >60 (ref >60)
GFR Non-African American: >60 (ref >60)
Globulin: 3.7 g/dL
Glucose: 110 mg/dL — ABNORMAL HIGH (ref 70–99)
Potassium: 4.4 mmol/L (ref 3.5–5.1)
Sodium: 136 mmol/L (ref 136–145)
Total Bilirubin: 0.3 mg/dL (ref 0.0–1.0)
Total Protein: 7.8 g/dL (ref 6.4–8.2)

## 2014-05-20 LAB — CBC WITH AUTO DIFFERENTIAL
Basophils %: 0.4 %
Basophils Absolute: 0 10*3/uL (ref 0.0–0.2)
Eosinophils %: 2.3 %
Eosinophils Absolute: 0.1 10*3/uL (ref 0.0–0.6)
Hematocrit: 41 % (ref 40.5–52.5)
Hemoglobin: 13.8 g/dL (ref 13.5–17.5)
Lymphocytes %: 28.5 %
Lymphocytes Absolute: 1.8 10*3/uL (ref 1.0–5.1)
MCH: 29.7 pg (ref 26.0–34.0)
MCHC: 33.5 g/dL (ref 31.0–36.0)
MCV: 88.6 fL (ref 80.0–100.0)
MPV: 7.3 fL (ref 5.0–10.5)
Monocytes %: 10.1 %
Monocytes Absolute: 0.6 10*3/uL (ref 0.0–1.3)
Neutrophils %: 58.7 %
Neutrophils Absolute: 3.7 10*3/uL (ref 1.7–7.7)
Platelets: 273 10*3/uL (ref 135–450)
RBC: 4.63 M/uL (ref 4.20–5.90)
RDW: 13.3 % (ref 12.4–15.4)
WBC: 6.3 10*3/uL (ref 4.0–11.0)

## 2014-05-20 LAB — CK: Total CK: 717 U/L — ABNORMAL HIGH (ref 39–308)

## 2014-05-20 LAB — VALPROIC ACID LEVEL, TOTAL: Valproic Acid Lvl: <2.8 ug/mL — ABNORMAL LOW (ref 50.0–100.0)

## 2014-05-20 MED ORDER — SODIUM CHLORIDE 0.9 % IV BOLUS
0.9 % | Freq: Once | INTRAVENOUS | Status: AC
Start: 2014-05-20 — End: 2014-05-20
  Administered 2014-05-20: 04:00:00 1000 mL via INTRAVENOUS

## 2014-05-20 MED ORDER — LEVETIRACETAM 500 MG PO TABS
500 MG | Freq: Once | ORAL | Status: AC
Start: 2014-05-20 — End: 2014-05-19
  Administered 2014-05-20: 03:00:00 1000 mg via ORAL

## 2014-05-20 MED ORDER — DIVALPROEX SODIUM 250 MG PO TBEC
250 MG | Freq: Once | ORAL | Status: AC
Start: 2014-05-20 — End: 2014-05-20
  Administered 2014-05-20: 06:00:00 500 mg via ORAL

## 2014-05-20 MED FILL — LEVETIRACETAM 500 MG PO TABS: 500 MG | ORAL | Qty: 2

## 2014-05-20 MED FILL — SODIUM CHLORIDE 0.9 % IV SOLN: 0.9 % | INTRAVENOUS | Qty: 1000

## 2014-05-20 MED FILL — DIVALPROEX SODIUM 250 MG PO TBEC: 250 MG | ORAL | Qty: 2

## 2014-05-20 NOTE — ED Notes (Signed)
Patient notified of DC. Pt sts that his only ride is now at work, and he is unable to pay for a cab ride home. After notifying Charge RN of pt's situation, charge agreed to get cab for patient. Operator called to notify of need for cab for patient. Waiting for ETA of cab arrival.     Harland DingwallSamantha Marie Lelania Bia, RN  05/20/14 [phone]

## 2014-05-20 NOTE — ED Notes (Signed)
Cab will arrive around 0415. Discharge instructions and prescriptions reviewed with pt. Pt allowed opportunity to ask questions and verbalized understanding.   Discharge office closed at this time. Pt provided one copy of discharge instructions, and second copy placed with chart.  Patient walked to waiting room, and advised to watch for the cab driver. Tech in the box informed of patient waiting for cab.       Harland DingwallSamantha Marie Jariah Jarmon, RN  05/20/14 [phone]

## 2014-06-27 ENCOUNTER — Emergency Department: Admit: 2014-06-27 | Primary: Internal Medicine

## 2014-06-27 ENCOUNTER — Inpatient Hospital Stay
Admit: 2014-06-28 | Discharge: 2014-06-28 | Disposition: A | Discharge status: Home / Self Care | Payer: PRIVATE HEALTH INSURANCE | Source: Home / Self Care | Admitting: Internal Medicine

## 2014-06-27 DIAGNOSIS — G40901 Epilepsy, unspecified, not intractable, with status epilepticus: Principal | ICD-10-CM

## 2014-06-27 LAB — CBC WITH AUTO DIFFERENTIAL
Basophils %: 0.5 %
Basophils Absolute: 0.1 10*3/uL (ref 0.0–0.2)
Eosinophils %: 0.3 %
Eosinophils Absolute: 0 10*3/uL (ref 0.0–0.6)
Hematocrit: 50.2 % (ref 40.5–52.5)
Hemoglobin: 16.2 g/dL (ref 13.5–17.5)
Lymphocytes %: 11.5 %
Lymphocytes Absolute: 2 10*3/uL (ref 1.0–5.1)
MCH: 30.4 pg (ref 26.0–34.0)
MCHC: 32.3 g/dL (ref 31.0–36.0)
MCV: 94.3 fL (ref 80.0–100.0)
MPV: 8 fL (ref 5.0–10.5)
Monocytes %: 6.1 %
Monocytes Absolute: 1 10*3/uL (ref 0.0–1.3)
Neutrophils %: 81.6 %
Neutrophils Absolute: 13.9 10*3/uL — ABNORMAL HIGH (ref 1.7–7.7)
Platelets: 261 10*3/uL (ref 135–450)
RBC: 5.32 M/uL (ref 4.20–5.90)
RDW: 13.9 % (ref 12.4–15.4)
WBC: 17 10*3/uL — ABNORMAL HIGH (ref 4.0–11.0)

## 2014-06-27 LAB — BASIC METABOLIC PANEL
Anion Gap: 33 — ABNORMAL HIGH (ref 3–16)
BUN: 11 mg/dL (ref 7–20)
CO2: 8 mmol/L — CL (ref 21–32)
Calcium: 9.6 mg/dL (ref 8.3–10.6)
Chloride: 100 mmol/L (ref 99–110)
Creatinine: 1.1 mg/dL (ref 0.9–1.3)
GFR African American: >60 (ref >60)
GFR Non-African American: >60 (ref >60)
Glucose: 126 mg/dL — ABNORMAL HIGH (ref 70–99)
Potassium: 3.6 mmol/L (ref 3.5–5.1)
Sodium: 141 mmol/L (ref 136–145)

## 2014-06-27 LAB — URINE DRUG SCREEN
Amphetamine Screen, Urine: NEGATIVE
Barbiturate Screen, Ur: NEGATIVE (ref <200)
Benzodiazepine Screen, Urine: NEGATIVE (ref <200)
Cannabinoid Scrn, Ur: POSITIVE — AB (ref <50)
Cocaine Metabolite Screen, Urine: NEGATIVE (ref <300)
Methadone Screen, Urine: NEGATIVE (ref <300)
Opiate Scrn, Ur: NEGATIVE (ref <300)
PCP Screen, Urine: NEGATIVE (ref <25)
Propoxyphene Scrn, Ur: NEGATIVE (ref <300)
pH, UA: 5

## 2014-06-27 LAB — ETHANOL: Ethanol Lvl: NOT DETECTED mg/dL

## 2014-06-27 MED ORDER — LORAZEPAM 2 MG/ML IJ SOLN
2 MG/ML | Freq: Once | INTRAMUSCULAR | Status: AC
Start: 2014-06-27 — End: 2014-06-27
  Administered 2014-06-27: 23:00:00 1 mg via INTRAVENOUS

## 2014-06-27 MED ORDER — LORAZEPAM 2 MG/ML IJ SOLN
2 MG/ML | INTRAMUSCULAR | Status: AC
Start: 2014-06-27 — End: 2014-06-28

## 2014-06-27 MED FILL — LORAZEPAM 2 MG/ML IJ SOLN: 2 MG/ML | INTRAMUSCULAR | Qty: 1

## 2014-06-27 NOTE — ED Notes (Addendum)
Room mate at bedside. Talking with patient and roommate re: pt taking his medications. Pt insistent that he is taking his medications. Pt became non-responsive with closed eyes and muscle tightening, arms raising up. Lasted approx 20-30 sec. Dr. Maryagnes AmosFrankovsky made aware. Room mate states seizure activity normal for patient.        Stanford BreedCora H Uno Esau, RN  06/27/14 2133

## 2014-06-27 NOTE — ED Notes (Signed)
PT. Sleeping but easily awakened.   Pt. Advised that he was in the emergency room and that he had another seizure while here.   Pt. Advised we gave him ativan to help control the seizure.   PT. Denies any pain or discomfort at this time.   Pt. Monitor shows s. Rhythm without ectopy.   No other changes.    Epifania Goreavid A Gaia Gullikson, RN  06/27/14 [phone]

## 2014-06-27 NOTE — H&P (Signed)
[facility] HISTORY [facility] PHYSICAL    06/27/2014 11:52 PM    Patient Information:  Roy GreeningRNEST J Mathey is a 29 y.o. male [id number]  PCP:  Roy JamaicaNISAR FATIMA HAQ, MD (Tel: [phone] )    Chief complaint:    Chief Complaint   Patient presents with   ??? Seizures     pt. [facility] by [facility] for c/o seizure.   pt. has history of seizures.  pt. denies any pain or discomfort.      History of Present Illness:  Roy Greeningrnest J Gall is a 29 y.o. male who presented with seizure.  Symptom onset was acute for a time period of 5 hour(s). The severity is described as severe. The course of his symptoms over time is intermittent. The symptoms improved with none and worsened with none. The patient's symptom is associated with fall.  No fever/chills/headache/cough.  On presentation to the ED, he had another seizure even before recovering from the last event.  His Keppra level was <2.   Patient has hs of seizure disorder for which he has been on Keppra 1g BID.  He said he missed only 1 dose of his Keppra..    History obtained from patient and electronic medical record .   Old medical records show patient was admitted here about 2 months ago for seizure.     REVIEW OF SYSTEMS:   At least 10 systems were reviewed  and were negative except as documented in the HPI above.    Past Medical History:   has a past medical history of Seizures (HCC); Migraine; Hypertension; Pneumonia; and Psychiatric problem.     Past Surgical History:   has no past surgical history on file.     Medications:  No current facility-administered medications on file prior to encounter.     Current Outpatient Prescriptions on File Prior to Encounter   Medication Sig Dispense Refill   ??? levETIRAcetam (KEPPRA) 1000 MG tablet Take 1 tablet by mouth 2 times daily 60 tablet 1   ??? nicotine (NICODERM CQ) 21 MG/24HR Place 1 patch onto the skin every 24 hours. 30 patch 3       Allergies:  No Known Allergies      Social History:   reports that he has been smoking Cigarettes.  He has a 1 pack-year smoking history. He quit smokeless tobacco use about 8 months ago. He reports that he drinks alcohol. He reports that he uses illicit drugs (Marijuana).     Family History:  family history includes Diabetes in his father.     Physical Exam:  BP 121/71 mmHg   Pulse 101   Temp(Src) 97.9 ??F (36.6 ??C) (Oral)   Resp 18   Ht 6\' 4"  (1.93 m)   Wt 182 lb (82.555 kg)   BMI 22.16 kg/m2   SpO2 95%    General appearance:  Appears comfortable. Well nourished  Eyes: Sclera clear, pupils equal  ENT: Moist mucus membranes, no thrush. Trachea midline.  Cardiovascular: Regular rhythm, normal S1, S2. No murmur, gallop, rub. No edema in lower extremities  Respiratory: Clear to auscultation bilaterally, no wheeze, good inspiratory effort  Gastrointestinal: Abdomen soft, non-tender, not distended, normal bowel sounds  Musculoskeletal: No cyanosis in digits, neck supple  Neurology: Cranial nerves grossly intact. Alert and oriented in time, place and person. No speech or motor deficits  Psychiatry: Appropriate affect. Not agitated  Skin: Warm, dry, normal turgor, no rash    Labs:  CBC:   Lab Results  Component Value Date    WBC 17.0 06/27/2014    RBC 5.32 06/27/2014    HGB 16.2 06/27/2014    HCT 50.2 06/27/2014    MCV 94.3 06/27/2014    MCH 30.4 06/27/2014    MCHC 32.3 06/27/2014    RDW 13.9 06/27/2014    PLT 261 06/27/2014    MPV 8.0 06/27/2014     BMP:    Lab Results   Component Value Date    NA 141 06/27/2014    K 3.6 06/27/2014    CL 100 06/27/2014    CO2 8 06/27/2014    BUN 11 06/27/2014    CREATININE 1.1 06/27/2014    CREATININE 1.0 08/17/2011    CALCIUM 9.6 06/27/2014    GFRAA >60 06/27/2014    GFRAA 157 08/19/2011    LABGLOM >60 06/27/2014    GLUCOSE 126 06/27/2014       Chest Xray: No Acute Cardiac or pulmonary changes.   EKG:    I visualized CXR images and EKG strips as discussed above.   Discussed patient's management with the ER  Practitioner, Dr Gaspar Skeeters     Problem List  Active Problems:    Seizure [facility])        Assessment/Plan:   1. Seizure: Already given loading dose of Keppra 1g iv. Resume home dose of 1g BID po in am. Consult neurology.  2. High anion gap metabolic acidosis: Due to seizure. Repeat labs in am.  3. Leukocytosis: Likely reactive (though possible he may have aspirated during the seizure events). Normal CXR. Send urine for UA. Monitor closely. Hold off on antibiotics for now.  4. Marijuana ZOX:WRUEAVW counseled.  5. Tobacco abuse: patient counseled. Place on nicoderm.  6.  DVT prophylaxis: Early ambulation.  7. Disposition: Expect discharge in am (after neurology eval).    Admit as inpatient. I anticipate hospitalization spanning more than two midnights for investigation and treatment of the above medically necessary diagnoses.    Calla Wedekind Jamelle Haring, MD    06/27/2014 11:52 PM

## 2014-06-27 NOTE — ED Notes (Signed)
Pt.  Noted to be having grand mal seizure in bed.   Dr. Maryagnes AmosFrankovsky to bedside.   Pt's seizure lasted approx 30 seconds.   Has snoring respirations at this time.  Placed on Oxygen via NRB at 15 LPM.   Suctioned pt's mouth.   IV established--22 in right hand.  Dr. Maryagnes AmosFrankovsky placed order for Ativan 1 mg IV.     Labs drawn at this time.   Pt. Placed on monitor.   Head of bed elevated.   Respirations became normal.   Will continue to monitor.    Epifania Goreavid A Donna Silverman, RN  06/27/14 [phone]

## 2014-06-27 NOTE — ED Notes (Signed)
Pt A&O x4. Pt states he feels better. VSS. No needs expressed at this time. Will continue to monitor.    Bernerd LimboSally Zayden Maffei, RN  06/27/14 [phone]

## 2014-06-27 NOTE — ED Notes (Signed)
Pt. [facility] and placed in er bed 25.    Pt. Is Awake, alert and oriented x 3.   Skin is warm, dry and natural.   Per EMS, they were called to the location for a person who was in seizure.   When they arrived, pt. Was no longer in seizure.   Pt. States that he has a history of seizures and that he is on medication for the same.   States that he took his medications this morning.   Pt. Denies any pain or discomfort.  Denies any trouble breathing.   States that just feels tired.   Pt. Moving all extremities without problems.   Placed side rails up x 2.  Seizure pads in place.       Epifania Goreavid A Abagale Boulos, RN  06/27/14 [phone]

## 2014-06-27 NOTE — ED Notes (Signed)
Seizure pads in place.     Michiel Sitesricia Janely Gullickson, RN  06/27/14 [phone]

## 2014-06-27 NOTE — ED Provider Notes (Signed)
[facility] ED  eMERGENCY dEPARTMENT eNCOUnter        Pt Name: Roy Weaver  MRN: [id number]  Birthdate 11-20-1985  Date of evaluation: 06/27/2014  Provider: Nigel Mormon, MD  PCP: Baldwin Jamaica, MD      CHIEF COMPLAINT       Chief Complaint   Patient presents with   ??? Seizures     pt. arrived by [facility] for c/o seizure.   pt. has history of seizures.  pt. denies any pain or discomfort.       HISTORY OF PRESENT ILLNESS   (Location/Symptom, Timing/Onset, Context/Setting, Quality, Duration, Modifying Factors, Severity)  Note limiting factors.     Roy Weaver is a 29 y.o. male presents by ambulance with seizure. He has a history of seizures and told his RN that he has been taking keppra as instructed and denied medication noncompliance prior to having another seizure shortly after arrival and prior to my first interaction with him. I was unable to obtain history directly initially since I was called to his bedside while he was having a generalized tonic-clonic seizure.    Nursing Notes were all reviewed and agreed with or any disagreements were addressed  in the HPI.    REVIEW OF SYSTEMS    (2-9 systems for level 4, 10 or more for level 5)     Review of Systems    Limited due to altered mental status and seizure      PAST MEDICAL HISTORY     Past Medical History   Diagnosis Date   ??? Seizures (HCC)    ??? Migraine    ??? Hypertension    ??? Pneumonia    ??? Psychiatric problem          SURGICAL HISTORY     History reviewed. No pertinent past surgical history.      CURRENT MEDICATIONS       Previous Medications    LEVETIRACETAM (KEPPRA) 1000 MG TABLET    Take 1 tablet by mouth 2 times daily    NICOTINE (NICODERM CQ) 21 MG/24HR    Place 1 patch onto the skin every 24 hours.       ALLERGIES     Review of patient's allergies indicates no known allergies.    FAMILY HISTORY       Family History   Problem Relation Age of Onset   ??? Diabetes Father           SOCIAL HISTORY       History      Social History   ??? Marital Status: Single     Spouse Name: N/A     Number of Children: 0   ??? Years of Education: 66     Social History Main Topics   ??? Smoking status: Current Every Day Smoker -- 0.50 packs/day for 2 years     Types: Cigarettes   ??? Smokeless tobacco: Former Neurosurgeon     Quit date: 10/04/2013   ??? Alcohol Use: 0.0 oz/week     0 Not specified per week   ??? Drug Use: Yes     Special: Marijuana      Comment: smoked marijuana yesterday (06/26/2014)   ??? Sexual Activity: None     Other Topics Concern   ??? None     Social History Narrative    ** Merged History Encounter **            SCREENINGS  PHYSICAL EXAM    (up to 7 for level 4, 8 or more for level 5)   ED Triage Vitals   BP Temp Temp Source Pulse Resp SpO2 Height Weight   06/27/14 1738 06/27/14 1738 06/27/14 1738 06/27/14 1738 06/27/14 1738 06/27/14 1738 06/27/14 1738 06/27/14 1738   117/71 mmHg 97.9 ??F (36.6 ??C) Oral 85 14 94 % 6\' 4"  (1.93 m) 182 lb (82.555 kg)       Physical Exam     General: actively seizing, well nourished, appears stated age  HEENT: Head atraumatic except small abrasion left temple, PERRLA, Oral cavity and oropharynx with copious saliva and frothing  Neck: supple, trachea midline  Pulmonary: lungs clear to auscultation bilaterally, normal work breathing, no wheezes or rales, rhonchi present  Cardiac:tachycardic rate and regular rhythm, no murmur, no lower extremity edema  Abdomen: soft, nontender to palpation, no mass, no hernia  Musculoskeletal: no joint swelling, no deformity, no calf tenderness or swelling  Neuro: Unresponsive, when I approach the patient he was demonstrating the last few beats of chlonic generalized seizure activity, all four extremities were affected there is no localization or lateralization   Skin: no rash   Psych: unable to assess     DIAGNOSTIC RESULTS   LABS:    Labs Reviewed   CBC WITH AUTO DIFFERENTIAL - Abnormal; Notable for the following:     WBC 17.0 (*)     Neutrophils Absolute 13.9 (*)      All other components within normal limits   BASIC METABOLIC PANEL - Abnormal; Notable for the following:     CO2 8 (*)     Anion Gap 33 (*)     Glucose 126 (*)     All other components within normal limits    Narrative:     CALL  Ward  SFERF tel. [phone],  Chemistry results called to and read back byTriscia Smith RN ER, 06/27/2014  18:28, [facility]   URINE DRUG SCREEN - Abnormal; Notable for the following:     Cannabinoid Scrn, Ur POSITIVE (*)     All other components within normal limits   LEVETIRACETAM LEVEL - Abnormal; Notable for the following:     Levetiracetam Lvl <2.0 (*)     All other components within normal limits   ETHANOL    Narrative:     CALL  Ward  [facility] tel. [phone],  Chemistry results called to and read back byTriscia Smith RN ER, 06/27/2014  18:28, by [facility]       All other labs were within normal range or not returned as of this dictation.    Interpretation per the Radiologist below, if available at the time of this note:    XR Chest Portable   Final Result   IMPRESSION:   No evidence of acute cardiopulmonary disease.               PROCEDURES   Unless otherwise noted below, none     Procedures    CRITICAL CARE TIME   Critical Care  There was a high probability of life-threatening clinical deterioration in the patient's condition requiring my urgent intervention.  Total critical care time with the patient was 30 minutes excluding separately reportable procedures.  Critical care required due to patients recurrent seizures, bedside care, airway management during convulsions and discussion of his case with the admitting team.       CONSULTS:  IP CONSULT TO HOSPITALIST    EMERGENCY DEPARTMENT COURSE and DIFFERENTIAL DIAGNOSIS/MDM:  Vitals:    Filed Vitals:    06/27/14 2146 06/27/14 2201 06/27/14 2231 06/27/14 2308   BP: 135/55 129/58 125/56 121/71   Pulse: 99 97 97 101   Temp:       TempSrc:       Resp: Height:       Weight:       SpO2:    95%       Patient was given the following  medications:  Medications   LORazepam (ATIVAN) 2 MG/ML injection (not administered)   LORazepam (ATIVAN) injection 1 mg (1 mg Intravenous Given 06/27/14 1753)   levetiracetam (KEPPRA) 500 mg/100 mL IVPB (0 mg Intravenous Stopped 06/27/14 2135)     After the patient woke up after his seizure on arrival he is now changed his story and states that he has been out of Keppra and just got his prescription refilled.  Therefore for I will go ahead and load him on Keppra.  Just before receiving his Keppra load the patient had another seizure making this the third seizure today.  The seizure stopped and he was then loaded on Keppra.   I reevaluated the patient again at 2300 hrs. and he is stable awake long enough and has cleared his postictal state enough that I'm finally able to interview him personally.  He states that he missed one dose of Keppra this morning and he is completely oriented now.  His story has changed somewhat during his stay today and based on his recurrent seizure which at happened immediately upon arrival with no lucid state between seizures and his recurrent seizure prior to receiving Keppra and the possibility that he is not adequately treated I will admit for further care.  I spoke with Dr. Milda Smart. We thoroughly discussed the history, physical exam, laboratory and imaging studies, as well as, emergency department course. Based upon that discussion, we've decided to admit for further observation and evaluation of their recurrent seizures.  As I have deemed necessary from their history, physical and studies, I have considered and evaluated for the following diagnoses:  CAVERNOUS VENOUS THROMBOSIS, DIABETES, INTRACRANIAL HEMORRHAGE, MENINGITIS, SEPSIS SUBARACHNOID HEMORRHAGE, SUBDURAL HEMATOMA, & STROKE.      The patient tolerated their visit well.   The patient and / or the family were informed of the results of any tests, a time was given to answer questions.    FINAL IMPRESSION      1. Status  epilepticus, generalized convulsive (HCC)          DISPOSITION/PLAN   DISPOSITION     PATIENT REFERRED TO:  No follow-up provider specified.    DISCHARGE MEDICATIONS:  New Prescriptions    No medications on file       DISCONTINUED MEDICATIONS:  Discontinued Medications    No medications on file              (Please note that portions of this note were completed with a voice recognition program.  Efforts were made to edit the dictations but occasionally words are mis-transcribed.)    Nigel Mormon, MD (electronically signed)            Nigel Mormon, MD  06/27/14 [phone]

## 2014-06-27 NOTE — ED Notes (Signed)
Pt is on a continuous pulse oximetry and telemetry monitoring. Pt continued on cycling blood pressure. Fall risk precautions in place, call light in reach, bed side table within reach, bed alarm on, will continue to monitor.          Michiel Sitesricia Elbert Spickler, RN  06/27/14 [phone]

## 2014-06-28 LAB — CBC WITH AUTO DIFFERENTIAL
Basophils %: 0.1 %
Basophils Absolute: 0 10*3/uL (ref 0.0–0.2)
Eosinophils %: 0.1 %
Eosinophils Absolute: 0 10*3/uL (ref 0.0–0.6)
Hematocrit: 45.5 % (ref 40.5–52.5)
Hemoglobin: 15.7 g/dL (ref 13.5–17.5)
Lymphocytes %: 14.1 %
Lymphocytes Absolute: 1.5 10*3/uL (ref 1.0–5.1)
MCH: 30.8 pg (ref 26.0–34.0)
MCHC: 34.4 g/dL (ref 31.0–36.0)
MCV: 89.6 fL (ref 80.0–100.0)
MPV: 7.8 fL (ref 5.0–10.5)
Monocytes %: 5 %
Monocytes Absolute: 0.5 10*3/uL (ref 0.0–1.3)
Neutrophils %: 80.7 %
Neutrophils Absolute: 8.3 10*3/uL — ABNORMAL HIGH (ref 1.7–7.7)
Platelets: 239 10*3/uL (ref 135–450)
RBC: 5.08 M/uL (ref 4.20–5.90)
RDW: 13.7 % (ref 12.4–15.4)
WBC: 10.3 10*3/uL (ref 4.0–11.0)

## 2014-06-28 LAB — URINALYSIS
Bilirubin Urine: NEGATIVE
Blood, Urine: NEGATIVE
Glucose, Ur: NEGATIVE mg/dL
Ketones, Urine: 40 mg/dL — AB
Leukocyte Esterase, Urine: NEGATIVE
Nitrite, Urine: NEGATIVE
Specific Gravity, UA: 1.024 (ref 1.005–1.030)
Urobilinogen, Urine: 0.2 E.U./dL (ref <2.0)
pH, UA: 5 (ref 5.0–8.0)

## 2014-06-28 LAB — BASIC METABOLIC PANEL
Anion Gap: 14 (ref 3–16)
BUN: 13 mg/dL (ref 7–20)
CO2: 20 mmol/L — ABNORMAL LOW (ref 21–32)
Calcium: 9.5 mg/dL (ref 8.3–10.6)
Chloride: 104 mmol/L (ref 99–110)
Creatinine: 0.9 mg/dL (ref 0.9–1.3)
GFR African American: >60 (ref >60)
GFR Non-African American: >60 (ref >60)
Glucose: 87 mg/dL (ref 70–99)
Potassium: 4.6 mmol/L (ref 3.5–5.1)
Sodium: 138 mmol/L (ref 136–145)

## 2014-06-28 LAB — LEVETIRACETAM LEVEL: Levetiracetam Lvl: <2 ug/mL — ABNORMAL LOW (ref 6.0–46.0)

## 2014-06-28 LAB — MICROSCOPIC URINALYSIS
Epithelial Cells, UA: 0 /HPF (ref 0–5)
Hyaline Casts, UA: 1 /HPF (ref 0–8)
RBC, UA: 0 /HPF (ref 0–4)
WBC, UA: 2 /HPF (ref 0–5)

## 2014-06-28 LAB — POCT GLUCOSE: POC Glucose: 86 mg/dl (ref 70–99)

## 2014-06-28 MED ORDER — ENOXAPARIN SODIUM 40 MG/0.4ML SC SOLN
40 MG/0.4ML | Freq: Every day | SUBCUTANEOUS | Status: DC
Start: 2014-06-28 — End: 2014-06-28
  Administered 2014-06-28: 14:00:00 40 mg via SUBCUTANEOUS

## 2014-06-28 MED ORDER — MAGNESIUM HYDROXIDE 400 MG/5ML PO SUSP
400 MG/5ML | Freq: Every day | ORAL | Status: DC | PRN
Start: 2014-06-28 — End: 2014-06-28

## 2014-06-28 MED ORDER — ONDANSETRON HCL 4 MG/2ML IJ SOLN
4 MG/2ML | Freq: Four times a day (QID) | INTRAMUSCULAR | Status: DC | PRN
Start: 2014-06-28 — End: 2014-06-28

## 2014-06-28 MED ORDER — LEVETIRACETAM IN NACL 500 MG/100ML IV SOLN
500 MG/100ML | Freq: Once | INTRAVENOUS | Status: AC
Start: 2014-06-28 — End: 2014-06-27
  Administered 2014-06-28: 02:00:00 500 mg via INTRAVENOUS

## 2014-06-28 MED ORDER — LEVETIRACETAM 500 MG PO TABS
500 MG | Freq: Two times a day (BID) | ORAL | Status: DC
Start: 2014-06-28 — End: 2014-06-28
  Administered 2014-06-28: 14:00:00 1000 mg via ORAL

## 2014-06-28 MED ORDER — NORMAL SALINE FLUSH 0.9 % IV SOLN
0.9 % | Freq: Two times a day (BID) | INTRAVENOUS | Status: DC
Start: 2014-06-28 — End: 2014-06-28
  Administered 2014-06-28: 14:00:00 10 mL via INTRAVENOUS

## 2014-06-28 MED ORDER — NORMAL SALINE FLUSH 0.9 % IV SOLN
0.9 % | INTRAVENOUS | Status: DC | PRN
Start: 2014-06-28 — End: 2014-06-28

## 2014-06-28 MED ORDER — LEVETIRACETAM 500 MG/5ML IV SOLN
500 MG/5ML | Freq: Once | INTRAVENOUS | Status: DC
Start: 2014-06-28 — End: 2014-06-27

## 2014-06-28 MED ORDER — LEVETIRACETAM IN NACL 500 MG/100ML IV SOLN
500 MG/100ML | Freq: Once | INTRAVENOUS | Status: AC
Start: 2014-06-28 — End: 2014-06-28
  Administered 2014-06-28: 05:00:00 500 mg via INTRAVENOUS

## 2014-06-28 MED ORDER — LORAZEPAM 2 MG/ML IJ SOLN
2 MG/ML | INTRAMUSCULAR | Status: DC | PRN
Start: 2014-06-28 — End: 2014-06-28

## 2014-06-28 MED ORDER — ACETAMINOPHEN 325 MG PO TABS
325 MG | ORAL | Status: DC | PRN
Start: 2014-06-28 — End: 2014-06-28

## 2014-06-28 MED FILL — KEPPRA 500 MG PO TABS: 500 MG | ORAL | Qty: 2

## 2014-06-28 MED FILL — LEVETIRACETAM IN NACL 500 MG/100ML IV SOLN: 500 MG/100ML | INTRAVENOUS | Qty: 100

## 2014-06-28 MED FILL — NORMAL SALINE FLUSH 0.9 % IV SOLN: 0.9 % | INTRAVENOUS | Qty: 10

## 2014-06-28 MED FILL — LOVENOX 40 MG/0.4ML SC SOLN: 40 MG/0.4ML | SUBCUTANEOUS | Qty: 0.4

## 2014-06-28 NOTE — Progress Notes (Addendum)
Received from ER per bed. Telemetry monitor applied. Oriented to room and call system. Plan of care medications and safety reviewed. Admission assess started. No visitors present at time of admission.

## 2014-06-28 NOTE — Discharge Summary (Signed)
Hospital Discharge Summary      Patient: Roy Weaver      Patient's PCP: Roy JamaicaNISAR FATIMA HAQ, MD    Admit Date: 06/28/2014     Discharge Date: 06/28/14    Admitting Physician: Dalbert BatmanKenneth Aaron Berg, MD    Discharge Physician: Arvin CollardUmasankar Lorne Winkels     Discharge Diagnoses:   Active Problems:    Seizure (HCC)    History of Present Illness:  Roy Weaver is a 29 y.o. male who presented with seizure. Symptom onset was acute for a time period of 5 hour(s). The severity is described as severe. The course of his symptoms over time is intermittent. The symptoms improved with none and worsened with none. The patient's symptom is associated with fall.  No fever/chills/headache/cough.  On presentation to the ED, he had another seizure even before recovering from the last event.  His Keppra level was <2.   Patient has hs of seizure disorder for which he has been on Keppra 1g BID.  He said he missed only 1 dose of his Keppra..    History obtained from patient and electronic medical record .   Old medical records show patient was admitted here about 2 months ago for seizure.     Hospital Course:      1. Break through Seizure: secondary to non compliance/laletly missing doses. Usually well controlled seizures for last 3 months since he was started on keppra. Continued  home dose of 1g BID po. Neurology consulted. No new recs. Advised compliance.   2. High anion gap metabolic acidosis: Due to seizure. Improved with iv fluids.  3. Leukocytosis: Likely reactive. Normal CXR and UA. Improved the next day  4. Marijuana use: advised cessation  5. Tobacco abuse: patient counseled. nicoderm.      Physical Exam:  BP 138/83 mmHg   Pulse 104   Temp(Src) 98.6 ??F (37 ??C) (Temporal)   Resp 18   Ht 6\' 4"  (1.93 m)   Wt 181 lb 3.2 oz (82.192 kg)   BMI 22.07 kg/m2   SpO2 97%  General appearance: alert, appears stated age and cooperative  Head: Normocephalic, without obvious abnormality, atraumatic  Eyes: conjunctivae/corneas clear. PERRL,  EOM's intact.  Throat: lips, mucosa, and tongue normal; teeth and gums normal  Neck: no adenopathy, no carotid bruit, no JVD, supple, symmetrical, trachea midline and thyroid not enlarged, symmetric, no tenderness/mass/nodules  Lungs: clear to auscultation bilaterally  Heart: regular rate and rhythm, S1, S2 normal, no murmur, click, rub or gallop  Abdomen: soft, non-tender; bowel sounds normal; no masses,  no organomegaly  Extremities: extremities normal, atraumatic, no cyanosis or edema  Neurologic: Grossly normal    Consults: neurology    Significant Diagnostic Studies:  cxr    Treatments: IV hydration; keppra     Disposition: home    Discharged Condition: Stable    Follow Up: neurology in 3-4 weeks    Discharge Medications:   Roy Greeningierre, Abrahm J   Home Medication Instructions NFA:[id number]    Printed on:06/28/14 1537   Medication Information                      levETIRAcetam (KEPPRA) 1000 MG tablet  Take 1 tablet by mouth 2 times daily             nicotine (NICODERM CQ) 21 MG/24HR  Place 1 patch onto the skin every 24 hours.  Activity: activity as tolerated; driving restirctions acc to state rules     Diet: regular diet    Wound Care: none needed      Time Spent on discharge is more than  30 minutes    Thank you Dr. Baldwin Jamaica, MD for the opportunity to be involved in this patients care. If you have any questions or concerns please feel free to contact me at [phone].    Signed:  Arvin Collard, MD   06/28/2014

## 2014-06-28 NOTE — Progress Notes (Signed)
Patient in bed with eyes closed, responsive to voice with clear verbals and head nods/gestures.  VSS.  Assessment complete.  AM medications given per orders.  Pt c/o headache pain 8/10 this AM but refuses Tylenol at this time preferring to rest.  Denies any further needs.  Camera monitor remains in use and seizure precautions in place for patient safety.  Will continue to monitor comfort and safety.

## 2014-06-28 NOTE — Progress Notes (Signed)
Hospitalist Progress Note  06/28/2014 12:42 PM  Subjective:   Admit Date: 06/28/2014  PCP: Dianna RossettiNISAR FATIMA HAQ, MD    Overnight Events: none    CC: F/U for seizures  Interval History: says he missed only one dose of keppra , possibly on feb 3 rd night. Denies any illicit drugs bu marijuana is positive.      Diet: DIET GENERAL;  Pain ZO:XWRUis:Mild  Nausea:None  Bowel Movement/Flatus yes    Data:   CBC:   Recent Labs      06/27/14   1755  06/28/14   0544   WBC  17.0*  10.3   HGB  16.2  15.7   PLT  261  239     BMP:    Recent Labs      06/27/14   1755  06/28/14   0544   NA  141  138   K  3.6  4.6   CL  100  104   CO2  8*  20*   BUN  11  13   CREATININE  1.1  0.9   GLUCOSE  126*  87     Hepatic: No results for input(s): AST, ALT, ALB, BILITOT, ALKPHOS in the last 72 hours.    Objective:   Vitals: BP 138/83 mmHg   Pulse 104   Temp(Src) 98.6 ??F (37 ??C) (Temporal)   Resp 18   Ht 6\' 4"  (1.93 m)   Wt 181 lb 3.2 oz (82.192 kg)   BMI 22.07 kg/m2   SpO2 97%  General appearance: alert, appears stated age and cooperative  Skin: Skin color, texture, turgor normal. No rashes or lesions  HEENT: Head: Normocephalic, no lesions, without obvious abnormality.  Neck: no adenopathy, no carotid bruit, no JVD, supple, symmetrical, trachea midline and thyroid not enlarged, symmetric, no tenderness/mass/nodules  Lungs: clear to auscultation bilaterally  Heart: regular rate and rhythm, S1, S2 normal, no murmur, click, rub or gallop  Abdomen: soft, non-tender; bowel sounds normal; no masses,  no organomegaly  Extremities: extremities normal, atraumatic, no cyanosis or edema  Neurologic: Mental status: Alert, oriented, thought content appropriate    Assessment:   Active Problems:    Seizure (HCC)    Plan:   1. Break through Seizure: ?secondary to non compliance. Usually well controlled seizures for last 3 months since he was started on keppra. Cont  home dose of 1g BID po.  Neurology consulted. i dont see  the need for increasing the dose of keppra. Advised compliance. Start on diet.   2. High anion gap metabolic acidosis: Due to seizure. Improving.   3. Leukocytosis: Likely reactive (though possible he may have aspirated during the seizure events). Normal CXR. Resolving. Await UA   4. Marijuana use: on urine screen   5. Tobacco abuse: patient counseled. cont nicoderm.  6. DVT prophylaxis: lovenox   Disposition: inpt 1  day    Full Code    Albertson'sUmasankar Bernon Arviso M.D

## 2014-06-28 NOTE — Consults (Signed)
In patient Neurology consult        [facility] Neurology      Roy Sergeant, MD      Roy Weaver  01-04-1986    Date of Service: 06/28/2014    Referring Physician: Arvin Collard, MD      Reason for the consult: recurrent seizures.     HPI:   The patient is a 29 years old man with known history of seizure disorder diagnosed about a year ago.  he came in to [facility] last night with witnessed seizure.  He was with his friend at his friend's house.  He developed a generalized motor seizure while playing videogames.  The patient was unconscious and when he woke up he was in the ED at [facility].  His friend's family called the squad who arrived at the scene and took him to the emergency room.  In the ED, he regained his consciousness slowly.  Now he is back to his baseline.    He was on Keppra 1 g twice daily.  He missed taking his morning dose. He has long-standing history of poor compliance.  he used to see Dr. Glean Hess and missed multiple appointments.  It unremarkable MRI and EEG in the past.  She denies use of drugs or drinking.  Today he denies any headache, fever or chills.  Other review of system was unremarkable.          Past Medical History   Diagnosis Date   ??? Seizures (HCC)    ??? Migraine    ??? Hypertension    ??? Pneumonia    ??? Psychiatric problem      Family History   Problem Relation Age of Onset   ??? Diabetes Father      History reviewed. No pertinent past surgical history.  History reviewed. No pertinent past surgical history.  Family History   Problem Relation Age of Onset   ??? Diabetes Father      History   Substance Use Topics   ??? Smoking status: Current Every Day Smoker -- 0.50 packs/day for 2 years     Types: Cigarettes   ??? Smokeless tobacco: Former Neurosurgeon     Quit date: 10/04/2013   ??? Alcohol Use: 0.0 oz/week     0 Not specified per week     No Known Allergies  Current Facility-Administered Medications   Medication Dose Route Frequency Provider Last Rate Last Dose   ??? levETIRAcetam  (KEPPRA) tablet 1,000 mg  1,000 mg Oral BID Liston Alba, MD   1,000 mg at 06/28/14 0830   ??? sodium chloride flush 0.9 % injection 10 mL  10 mL Intravenous 2 times per day Liston Alba, MD   10 mL at 06/28/14 0830   ??? sodium chloride flush 0.9 % injection 10 mL  10 mL Intravenous PRN Liston Alba, MD       ??? acetaminophen (TYLENOL) tablet 650 mg  650 mg Oral Q4H PRN Liston Alba, MD       ??? magnesium hydroxide (MILK OF MAGNESIA) 400 MG/5ML suspension 30 mL  30 mL Oral Daily PRN Liston Alba, MD       ??? ondansetron (ZOFRAN) injection 4 mg  4 mg Intravenous Q6H PRN Liston Alba, MD       ??? enoxaparin (LOVENOX) injection 40 mg  40 mg Subcutaneous Daily Liston Alba, MD   40 mg at 06/28/14 0830   ??? LORazepam (ATIVAN) injection 2 mg  2 mg Intravenous Q4H PRN Liston Albahristian Zeluwa Ugwualor, MD           ROS: 14 points were reviewed with the patient, otherwise per my HPI.     Exam:   BP 138/83 mmHg   Pulse 104   Temp(Src) 98.6 ??F (37 ??C) (Temporal)   Resp 18   Ht 6\' 4"  (1.93 m)   Wt 181 lb 3.2 oz (82.192 kg)   BMI 22.07 kg/m2   SpO2 97%  Constitutional: Weight: well-nourished.   Head: Size/Trauma: normocephalic  Neck: supple and no carotid bruits.   Heart: S1S2 Regular  Mental Status: Oriented to person, place, problem, and time.    Cranial Nerves:  2-12 wnl.   Motor Exam: 5/5 in all 4 extremities.   Reflexes: Bilateral biceps 2/4, triceps 2/4, brachial radialis 2/4, knee 2/4 and ankle 2/4.   Planters: flexor bilaterally.  Coordination: no pronator drift, no dysmetria. Finger nose finger testing within normal limits.  Sensation: normal to all modalities.  Gait/Posture: NT    Data:  LABS:   Lab Results   Component Value Date    NA 138 06/28/2014    K 4.6 06/28/2014    CL 104 06/28/2014    CO2 20 06/28/2014    BUN 13 06/28/2014    CREATININE 0.9 06/28/2014    CREATININE 1.0 08/17/2011    GFRAA >60 06/28/2014    GFRAA 157 08/19/2011     LABGLOM >60 06/28/2014    GLUCOSE 87 06/28/2014    PHOS 3.3 03/31/2014    MG 2.50 05/18/2014    CALCIUM 9.5 06/28/2014     Lab Results   Component Value Date    WBC 10.3 06/28/2014    RBC 5.08 06/28/2014    HGB 15.7 06/28/2014    HCT 45.5 06/28/2014    MCV 89.6 06/28/2014    RDW 13.7 06/28/2014    PLT 239 06/28/2014     Lab Results   Component Value Date    INR 1.03 03/29/2014    PROTIME 11.7 03/29/2014     Neuroimaging and labs reviewed by me and discussed results with the patient.    Impression:  Breakthrough seizure in a patient with known history of epilepsy.  Most likely secondary to poor compliance and not taking his Keppra.  Less likely new CNS infection or stroke.  Nonfocal exam.  Poor compliance     Recommendation:  Restart Keppra 1 gm bid  Seizures precautions and no driving  Long discussion with the patient regarding seizure precautions and risk of recurrence off medication  Can be discharged from neurology if medically stable         Thank you for referring such patient. If you have any questions regarding my consult note, please don't hesitate to call me.     Roy Sergeantamer Saad, MD  [phone]    This dictation was generated by voice recognition computer software. Although all attempts are made to edit the dictation for accuracy, there may be errors in the  transcription that are not intended

## 2014-06-28 NOTE — Plan of Care (Signed)
Problem: Safety:  Goal: Free from accidental physical injury  Outcome: Ongoing  Pt on seizure precautions, bed sided padded, suction at bedside, camera in room.     Problem: Pain:  Goal: Patient???s pain/discomfort is manageable  Outcome: Ongoing  Pt educated on 0-10 pain scale. He c/o headache 7/10. Tylenol offered, pt refused, requesting rest.     Comments:   Call light in reach, will continue to monitor.

## 2014-06-28 NOTE — Other (Addendum)
Patient Acct Nbr:  [id number]  Primary AUTH/CERT:    Primary Insurance Company Name:   [employer]/HMO  Primary Insurance Plan Name:  [facility]/CAID HMO  Primary Insurance Group Number:  [facility]  Primary Insurance Plan Type: [employer]  Primary Insurance Policy Number:  [id number]

## 2014-06-28 NOTE — Progress Notes (Addendum)
During admission questions pt was responsive and answering questions when he began to chew. He was able to focus on my voice and respond with nods.   Pupils assessed equal,round, and responsive. Pt began to experience mild uncontrollable movements of upper and lower extremities; attempted to get out of bed. VSS with temporal 100.1 degrerees unable to get oral due to chewing, axillary 100.5 degrees. Placed pt on 2l nasal cannula due to SPO2 88%, SPO2 increased to 95% quickly and was 100% once patient was stable. Once stable the oral temp. was 98.9 degrees. Neuro assessment completed pt unable to verbally respond but able to shake head, respond to commands, squeeze hands and over a few minutes repeated and WNL. Generalized weakness noted, A&O, vss currently. MD notified via page, still waiting on response. Oncoming nurse given full update, vitals taken and stable.   After pt was stable found blood on pillow case, asked pt if he hit his head, he states he hit the back of his head. No injury found to back of head small abrasion noted on right eyebrow.

## 2014-06-28 NOTE — Consults (Signed)
Patient was referred to the financial counselor, but he is community medicaid for medication assistance.

## 2014-06-28 NOTE — Progress Notes (Signed)
Data- discharge order received, pt verbalized agreement to discharge, disposition to previous residence, no needs for HHC/DME.     Action- discharge instructions prepared and given to patient, pt verbalized understanding. Medication information packet given r/t NEW and/or CHANGED prescriptions emphasizing name/purpose/side effects, pt verbalized understanding. Discharge instruction summary: Diet- General, Activity- Resume as tolerated, Primary Care Physician as follows: [name], MD [phone] f/u appointment 1-2 weeks, immunizations reviewed and up-to-date, prescription medications filled at pharmacy of choice. Inpatient surgical procedure precautions reviewed: None    Response- Pt belongings gathered, IV removed. Disposition is home (no HHC/DME needs), transported with belongings, taken to lobby via w/c w/ friend, no complications.

## 2014-06-28 NOTE — ED Notes (Signed)
Called report to Sam, RN, pt seen on monitor, SR. Pt going to floor with Keppra running, RN aware. Called [facility], Tech to transport.    Bernerd LimboSally Markayla Reichart, RN  06/28/14 [phone]

## 2014-08-07 ENCOUNTER — Inpatient Hospital Stay: Admit: 2014-08-07 | Discharge: 2014-08-07 | Attending: Emergency Medicine

## 2014-08-07 DIAGNOSIS — G40909 Epilepsy, unspecified, not intractable, without status epilepticus: Secondary | ICD-10-CM

## 2014-08-07 LAB — BASIC METABOLIC PANEL
Anion Gap: 15 (ref 3–16)
BUN: 8 mg/dL (ref 7–20)
CO2: 24 mmol/L (ref 21–32)
Calcium: 9.5 mg/dL (ref 8.3–10.6)
Chloride: 99 mmol/L (ref 99–110)
Creatinine: 0.9 mg/dL (ref 0.9–1.3)
GFR African American: >60 (ref >60)
GFR Non-African American: >60 (ref >60)
Glucose: 110 mg/dL — ABNORMAL HIGH (ref 70–99)
Potassium: 4.9 mmol/L (ref 3.5–5.1)
Sodium: 138 mmol/L (ref 136–145)

## 2014-08-07 LAB — POCT VENOUS: POC Glucose: 115 mg/dl — ABNORMAL HIGH (ref 70–99)

## 2014-08-07 MED ORDER — LEVETIRACETAM IN NACL 1000 MG/100ML IV SOLN
1000 MG/100ML | Freq: Once | INTRAVENOUS | Status: AC
Start: 2014-08-07 — End: 2014-08-07
  Administered 2014-08-07: 21:00:00 1000 mg via INTRAVENOUS

## 2014-08-07 MED ORDER — LEVETIRACETAM 500 MG/5ML IV SOLN
500 MG/5ML | Freq: Once | INTRAVENOUS | Status: DC
Start: 2014-08-07 — End: 2014-08-07

## 2014-08-07 MED ORDER — LEVETIRACETAM 1000 MG PO TABS
1000 MG | ORAL_TABLET | Freq: Two times a day (BID) | ORAL | Status: DC
Start: 2014-08-07 — End: 2014-08-29

## 2014-08-07 MED ORDER — LEVETIRACETAM 500 MG PO TABS
500 MG | Freq: Once | ORAL | Status: DC
Start: 2014-08-07 — End: 2014-08-07

## 2014-08-07 MED FILL — LEVETIRACETAM IN NACL 1000 MG/100ML IV SOLN: 1000 MG/100ML | INTRAVENOUS | Qty: 100

## 2014-08-07 NOTE — ED Notes (Signed)
Pt. Brought to ED by EMS, pt. was ambulating in room upon arrival. Vitals taken upon arrival and were stable. Pt. Was sitting on the bed when seizure like activity began. Lasting about 45 seconds. Pt. Adjusted to semi-fowlers position and vitals rechecked post-seizure, vitals stable.    Durward FortesAmber M Candiace West, RN  08/07/14 [phone]

## 2014-08-07 NOTE — ED Notes (Signed)
Pt. Discharged at this time. Discharge and follow-up instructions reviewed. Pt. Acknowledged understanding of importance of taking medication daily. Pt. Stable and ambulated to discharge office. Pt. Stated it was his friend who is picking him up. Pt. Was walked out to ED parking lot to meet friend.     Durward FortesAmber M Nikie Cid, RN  08/07/14 [phone]

## 2014-08-07 NOTE — ED Notes (Signed)
Bed: 30-01  Expected date:   Expected time:   Means of arrival:   Comments:  squad

## 2014-08-07 NOTE — ED Provider Notes (Signed)
Triage Chief Complaint:   Seizures      HPI:  Roy Weaver is a 29 y.o. male that presents complaining of "I had a seizure and my friend freaked out".  Patient reports history of seizure disorder and ran out of his Keppra 2 days ago.  He does not have a refill.  Patient currently denying symptoms but is somewhat slow to answer questions although he is appropriate.  He did have a witnessed 45 second generalized tonic-clonic seizure on arrival as seen by the nursing staff.  He is denying any recent illnesses such as fever, cough, vomiting.  He denies any pain.  No other exacerbating or alleviating symptoms..    Past Medical History   Diagnosis Date   ??? Seizures (HCC)    ??? Migraine    ??? Hypertension    ??? Pneumonia    ??? Psychiatric problem      No past surgical history on file.  Family History   Problem Relation Age of Onset   ??? Diabetes Father      History     Social History   ??? Marital Status: Single     Spouse Name: N/A     Number of Children: 0   ??? Years of Education: 14     Occupational History   ??? Not on file.     Social History Main Topics   ??? Smoking status: Current Every Day Smoker -- 0.50 packs/day for 2 years     Types: Cigarettes   ??? Smokeless tobacco: Former Neurosurgeon     Quit date: 10/04/2013   ??? Alcohol Use: 0.0 oz/week     0 Not specified per week   ??? Drug Use: Yes     Special: Marijuana      Comment: smoked marijuana yesterday (06/26/2014)   ??? Sexual Activity: Not on file     Other Topics Concern   ??? Not on file     Social History Narrative    ** Merged History Encounter **          No current facility-administered medications for this encounter.     Current Outpatient Prescriptions   Medication Sig Dispense Refill   ??? levETIRAcetam (KEPPRA) 1000 MG tablet Take 1.5 tablets by mouth 2 times daily 60 tablet 1   ??? nicotine (NICODERM CQ) 21 MG/24HR Place 1 patch onto the skin every 24 hours. 30 patch 3     No Known Allergies    Nursing Notes Reviewed    ROS:  General: no fevers/chills  HEENT: no congestion, no  sore throat, no ear pain  Eyes: no discharge, no vision changes  Cardiac: no chest pain, no palpitations  Pulmonary: no SOB, no cough  GI: no N/V/D, no abdominal pain  GU: no dysuria  Musculoskeletal: no joint pain or swelling  Skin: no rash or unusual bruising  Neurologic: no headaches, no dizziness    Physical Exam:  ED Triage Vitals   Enc Vitals Group      BP 08/07/14 1702 138/83 mmHg      Pulse 08/07/14 1702 85      Resp 08/07/14 1702 17      Temp 08/07/14 1725 98.4 ??F (36.9 ??C)      Temp Source 08/07/14 1725 Oral      SpO2 08/07/14 1702 95 %      Weight --       Height --       Head Cir --  Peak Flow --       Pain Score --       Pain Loc --       Pain Edu? --       Excl. in GC? --      My pulse oximetry interpretation is which is within the normal range    GENERAL APPEARANCE: Awake and alert. Cooperative. Well appearing.  HEAD: Normocephalic. Atraumatic.  EYES: PERRL, normal conjunctiva, extraocular movements intact  ENT: Mucous membranes are moist. Normal oropharynx  NECK: Supple.   HEART: RRR.   LUNGS: Respirations unlabored without retractions. CTAB.  ABDOMEN: Soft. Non-tender, nondistended. No guarding or rebound.  EXTREMITIES: No acute deformities. No swelling or edema  SKIN: Warm and dry.  NEUROLOGICAL: Alert, oriented to person, place and date but mildly slow to answer questions..  Fluent speech. CN 2- 12 grossly intact.  Motor strength 5/5 in all extremities.  Sensation intact to light touch throughout.  No cerebellar drift.  Symmetric reflexes.  PSYCHIATRIC: Normal mood.    I have reviewed and interpreted all of the currently available lab results from this visit (if applicable):  Results for orders placed or performed during the hospital encounter of 08/07/14   Basic Metabolic Panel   Result Value Ref Range    Sodium 138 136 - 145 mmol/L    Potassium 4.9 3.5 - 5.1 mmol/L    Chloride 99 99 - 110 mmol/L    CO2 24 21 - 32 mmol/L    Anion Gap 15 3 - 16    Glucose 110 (H) 70 - 99 mg/dL    BUN 8 7 - 20  mg/dL    CREATININE 0.9 0.9 - 1.3 mg/dL    GFR Non-African American >60 >60    GFR African American >60 >60    Calcium 9.5 8.3 - 10.6 mg/dL   POCT Venous   Result Value Ref Range    POC Glucose 115 (H) 70 - 99 mg/dl    Sample Type VEN     Performed on SEE BELOW         MDM:  Patient presents after seizure at home and seizure on arrival.  Differential diagnosis includes medication noncompliance, hypoglycemia, electrolyte abnormality.  Patient was mildly postictal at the time of my evaluation.  He was given IV Keppra.  On reevaluation he is back to baseline mental status.  He is given a prescription for his regular Keppra and advised to take it as prescribed and stressed importance of following up with his primary care physician for long-term treatment for seizure disorder.  He is instructed to return for any concerns.    Clinical Impression:  1. Seizure disorder [facility])          Disposition referral (if applicable):  Nisar Karmen Stabs, MD  [address] [place] [address]  [phone]    Schedule an appointment as soon as possible for a visit in 3 days  Take your medications as prescribed.  Return to ER for any concerns      Disposition medications (if applicable):  Discharge Medication List as of 08/07/2014  6:28 PM            Comment: Please note this report has been produced using speech recognition software and may contain errors related to that system including errors in grammar, punctuation, and spelling, as well as words and phrases that may be inappropriate. If there are any questions or concerns please feel free to contact the dictating physician for  clarification      Terie Purseramara S Carlis Blanchard, MD           Terie Purseramara S Eathon Valade, MD  08/08/14 [phone]

## 2014-08-25 ENCOUNTER — Inpatient Hospital Stay: Admit: 2014-08-25 | Discharge: 2014-08-26 | Attending: Emergency Medicine

## 2014-08-25 ENCOUNTER — Inpatient Hospital Stay: Admit: 2014-08-25 | Discharge: 2014-08-25 | Attending: Emergency Medicine

## 2014-08-25 DIAGNOSIS — R569 Unspecified convulsions: Secondary | ICD-10-CM

## 2014-08-25 DIAGNOSIS — Z87898 Personal history of other specified conditions: Secondary | ICD-10-CM

## 2014-08-25 LAB — POCT GLUCOSE: POC Glucose: 112 mg/dl — ABNORMAL HIGH (ref 70–99)

## 2014-08-25 LAB — CBC WITH AUTO DIFFERENTIAL
Basophils %: 1 %
Basophils Absolute: 0.1 10*3/uL (ref 0.0–0.2)
Eosinophils %: 2.1 %
Eosinophils Absolute: 0.2 10*3/uL (ref 0.0–0.6)
Hematocrit: 47 % (ref 40.5–52.5)
Hemoglobin: 15.3 g/dL (ref 13.5–17.5)
Lymphocytes %: 24.7 %
Lymphocytes Absolute: 2.4 10*3/uL (ref 1.0–5.1)
MCH: 30.8 pg (ref 26.0–34.0)
MCHC: 32.5 g/dL (ref 31.0–36.0)
MCV: 95 fL (ref 80.0–100.0)
MPV: 7.8 fL (ref 5.0–10.5)
Monocytes %: 5.5 %
Monocytes Absolute: 0.5 10*3/uL (ref 0.0–1.3)
Neutrophils %: 66.7 %
Neutrophils Absolute: 6.5 10*3/uL (ref 1.7–7.7)
Platelets: 259 10*3/uL (ref 135–450)
RBC: 4.95 M/uL (ref 4.20–5.90)
RDW: 14.2 % (ref 12.4–15.4)
WBC: 9.7 10*3/uL (ref 4.0–11.0)

## 2014-08-25 LAB — URINE DRUG SCREEN
Amphetamine Screen, Urine: NEGATIVE
Barbiturate Screen, Ur: NEGATIVE (ref <200)
Benzodiazepine Screen, Urine: NEGATIVE (ref <200)
Cannabinoid Scrn, Ur: POSITIVE — AB (ref <50)
Cocaine Metabolite Screen, Urine: POSITIVE — AB (ref <300)
Methadone Screen, Urine: NEGATIVE (ref <300)
Opiate Scrn, Ur: NEGATIVE (ref <300)
PCP Screen, Urine: NEGATIVE (ref <25)
Propoxyphene Scrn, Ur: NEGATIVE (ref <300)
pH, UA: 5

## 2014-08-25 LAB — COMPREHENSIVE METABOLIC PANEL
ALT: 10 U/L (ref 10–40)
AST: 27 U/L (ref 15–37)
Albumin/Globulin Ratio: 1.4 (ref 1.1–2.2)
Albumin: 4.8 g/dL (ref 3.4–5.0)
Alkaline Phosphatase: 106 U/L (ref 40–129)
Anion Gap: 32 — ABNORMAL HIGH (ref 3–16)
BUN: 9 mg/dL (ref 7–20)
CO2: 10 mmol/L — CL (ref 21–32)
Calcium: 9.3 mg/dL (ref 8.3–10.6)
Chloride: 96 mmol/L — ABNORMAL LOW (ref 99–110)
Creatinine: 1.1 mg/dL (ref 0.9–1.3)
GFR African American: >60 (ref >60)
GFR Non-African American: >60 (ref >60)
Globulin: 3.4 g/dL
Glucose: 114 mg/dL — ABNORMAL HIGH (ref 70–99)
Potassium: 4.3 mmol/L (ref 3.5–5.1)
Sodium: 138 mmol/L (ref 136–145)
Total Bilirubin: 0.5 mg/dL (ref 0.0–1.0)
Total Protein: 8.2 g/dL (ref 6.4–8.2)

## 2014-08-25 MED ORDER — LORAZEPAM 2 MG/ML IJ SOLN
2 MG/ML | Freq: Once | INTRAMUSCULAR | Status: AC
Start: 2014-08-25 — End: 2014-08-25
  Administered 2014-08-25: 21:00:00 1 mg via INTRAVENOUS

## 2014-08-25 MED ORDER — LEVETIRACETAM IN NACL 1000 MG/100ML IV SOLN
1000 MG/100ML | Freq: Once | INTRAVENOUS | Status: AC
Start: 2014-08-25 — End: 2014-08-25
  Administered 2014-08-25: 21:00:00 1000 mg via INTRAVENOUS

## 2014-08-25 MED FILL — LORAZEPAM 2 MG/ML IJ SOLN: 2 MG/ML | INTRAMUSCULAR | Qty: 1

## 2014-08-25 MED FILL — LEVETIRACETAM IN NACL 1000 MG/100ML IV SOLN: 1000 MG/100ML | INTRAVENOUS | Qty: 100

## 2014-08-25 NOTE — ED Notes (Signed)
Patient verbalized understanding of discharge instructions and follow-up care.  Patient was not given any of prescriptions and verbalized understanding of instructions for said prescription(s).  Breathing regular, no distress, skin Skin color, texture, turgor normal. No rashes or lesions., patient alert and oriented X3.  Patient ambulated out of emergency department with steady gait to private vehicle.  Accompanied by none.      Trellis PaganiniMelissa Lille Karim, RN  08/25/14 [phone]

## 2014-08-25 NOTE — ED Notes (Signed)
Pt resting, resp easy and even, SR on monitor, pt sleeping, no seizure activity noted      Avanell ShackletonJessica J Estelle Skibicki, RN  08/25/14 [phone]

## 2014-08-25 NOTE — ED Provider Notes (Signed)
I independently evaluated and obtained a history and physical on Annell Greeningrnest J Lafoe.    All diagnostic, treatment, and disposition assistants were made to myself in conjunction the advanced practice provider.    For further details of this patient's emergency department encounter, please see the advanced practice provider's documentation.    History: Patient has a history of seizures, and has had multiple adjustments of his prescribed Keppra recently.  Was prescribed 1500 mg twice daily at one point, and this was decreased to 750 mg.  He had seizures while on this dose, so this was increased back to 1000 mg twice daily.  However, patient states that he has been taking 750 mg twice a day since that was what he had available to him.  He also admits to smoking marijuana recently.  He had been living at home, but stayed at his mother's house this weekend.  He was taking a nap this afternoon, and his mother became concerned that she had not seen him, went to check on him, and may have had difficulty waking him, as he awoke to ambulance personnel transporting him to the hospital.  There was no specifically witnessed seizure activity.  Patient did not bite his tongue.  He did not lose bowel or bladder control.  There was no postictal confusion or lethargy.  Patient feels fine at present.  He wants to go home.    Physician Exam: Awake and alert, no acute distress.  Heart is regular rate and rhythm without murmur rub or gallop.  Lungs clear to auscultation bilaterally.  Neurologic exam is nonfocal.  He is awake, alert, and oriented ??3, answering questions appropriately.    Patient presents with history seizure disorder, with reported concern for repeat seizure, but no evidence of seizure activity or postictal state.  She feels fine.  He was advised to resume dosing of his Keppra at 1000 mg twice daily, as well as refrain from marijuana use.  He has a follow-up appointment with his neurologist on Thursday.  Seizure precautions  were reviewed.  Stable for discharge home.      Pearlie OysterJeffrey B Early Steel, MD  08/25/14 [phone]

## 2014-08-25 NOTE — ED Notes (Signed)
Patient is alert and oriented.  Breathing is even and unlabored.  NSR on monitor.  Seizure pads on bed, patient states that he has had a recent decrease in his Keppra, but thinks that the reason for his episode today was his visit to his mother's house.    Trellis PaganiniMelissa Kynzie Polgar, RN  08/25/14 [phone]

## 2014-08-25 NOTE — ED Notes (Signed)
PA/NP to bedside.    Trellis PaganiniMelissa Erienne Spelman, RN  08/25/14 [phone]

## 2014-08-25 NOTE — ED Provider Notes (Signed)
[facility] EMERGENCY DEPT  eMERGENCY dEPARTMENT eNCOUnter        Pt Name: Roy Weaver  MRN: [id number]  Birthdate Oct 05, 1985  Date of evaluation: 08/25/2014  Provider: Tonye Royalty, PA-C  PCP: Baldwin Jamaica, MD  ED Attending: Pearlie Oyster, MD    CHIEF COMPLAINT       Chief Complaint   Patient presents with   ??? Seizures       HISTORY OF PRESENT ILLNESS   (Location/Symptom, Timing/Onset, Context/Setting, Quality, Duration, Modifying Factors, Severity)  Note limiting factors.     Roy Weaver is a 29 y.o. male  patient had a witnessed seizure on his way home.  His mother was driving.  Patient states he took his 750 mg Keppra this morning as previously stated.  Patient is postictal and is slow to answer questions on evaluation. He is easily arousible when prompted with questions.     Nursing Notes were all reviewed and agreed with or any disagreements were addressed  in the HPI.    REVIEW OF SYSTEMS    (2-9 systems for level 4, 10 or more for level 5)     Review of Systems   HENT:        Denies biting tongue   Respiratory: Negative for shortness of breath.    Cardiovascular: Negative for chest pain.   Gastrointestinal: Negative for nausea, vomiting and abdominal pain.   Neurological: Positive for seizures.       Except as noted above in the ROS, all other systems were reviewed and negative.       PAST MEDICAL HISTORY     Past Medical History   Diagnosis Date   ??? Seizures (HCC)    ??? Migraine    ??? Hypertension    ??? Pneumonia    ??? Psychiatric problem          SURGICAL HISTORY     No past surgical history on file.      CURRENT MEDICATIONS       Previous Medications    LEVETIRACETAM (KEPPRA) 1000 MG TABLET    Take 1.5 tablets by mouth 2 times daily    NICOTINE (NICODERM CQ) 21 MG/24HR    Place 1 patch onto the skin every 24 hours.         ALLERGIES     Review of patient's allergies indicates no known allergies.    FAMILY HISTORY       Family History   Problem Relation Age of Onset   ??? Diabetes Father            SOCIAL HISTORY       History     Social History   ??? Marital Status: Single     Spouse Name: N/A     Number of Children: 0   ??? Years of Education: 1     Social History Main Topics   ??? Smoking status: Current Every Day Smoker -- 0.50 packs/day for 2 years     Types: Cigarettes   ??? Smokeless tobacco: Former Neurosurgeon     Quit date: 10/04/2013   ??? Alcohol Use: 0.0 oz/week     0 Not specified per week   ??? Drug Use: Yes     Special: Marijuana      Comment: smoked marijuana yesterday (06/26/2014)   ??? Sexual Activity: None     Other Topics Concern   ??? None     Social History Narrative    ** Merged History  Encounter **            SCREENINGS             PHYSICAL EXAM    (up to 7 for level 4, 8 or more for level 5)   ED Triage Vitals   BP Temp Temp src Pulse Resp SpO2 Height Weight   08/25/14 1700 -- -- 08/25/14 1700 08/25/14 1700 08/25/14 1700 -- --   160/79 mmHg   113 15 95 %         Physical Exam   Constitutional: He is oriented to person, place, and time. He appears well-developed and well-nourished. He appears lethargic.   HENT:   Head: Normocephalic and atraumatic.   Mouth/Throat: Oropharynx is clear and moist. No oral lesions. No lacerations.   Eyes: Conjunctivae are normal. Right eye exhibits no discharge. Left eye exhibits no discharge.   Neck: Normal range of motion. Neck supple.   Cardiovascular: Regular rhythm and normal heart sounds.    Pulmonary/Chest: Effort normal and breath sounds normal. No respiratory distress. He has no wheezes.   Abdominal: Soft. There is no tenderness.   Genitourinary:   Pants wet consistent with incontinence   Musculoskeletal:   Able to move all extremities   Neurological: He is oriented to person, place, and time. He has normal strength. He appears lethargic. No sensory deficit. GCS eye subscore is 4. GCS verbal subscore is 5. GCS motor subscore is 6.   Skin: Skin is warm and dry. He is not diaphoretic.   Psychiatric: His speech is delayed. He is slowed.   Consistent with postictal state        DIAGNOSTIC RESULTS   LABS:    Labs Reviewed   POCT GLUCOSE - Abnormal; Notable for the following:     POC Glucose 112 (*)     All other components within normal limits   URINE DRUG SCREEN   LEVETIRACETAM LEVEL   CBC WITH AUTO DIFFERENTIAL   COMPREHENSIVE METABOLIC PANEL       All other labs were within normal range or not returned as of this dictation.    EKG: All EKG's are interpreted by the Emergency Department Physician who either signs or Co-signs this chart in the absence of a cardiologist.  Please see their note for interpretation of EKG.      RADIOLOGY:   Non-plain film images such as CT, Ultrasound and MRI are read by the radiologist. Plain radiographic images are visualized and preliminarily interpreted by the  ED Provider with the below findings:      Interpretation per the Radiologist below, if available at the time of this note:    No orders to display     No results found.    PROCEDURES   Unless otherwise noted below, none     Procedures    CRITICAL CARE TIME   N/A    CONSULTS:  None      EMERGENCY DEPARTMENT COURSE and DIFFERENTIAL DIAGNOSIS/MDM:   Vitals:    Filed Vitals:    08/25/14 1700   BP: 160/79   Pulse: 113   Resp: 15   SpO2: 95%       Patient was given the following medications:  Medications   LORazepam (ATIVAN) injection 1 mg (1 mg Intravenous Given 08/25/14 1717)   levetiracetam (KEPPRA) 1000 mg/100 mL IVPB (1,000 mg Intravenous New Bag 08/25/14 1718)       Patient is postictal on initial exam.  He is easily arousable to voice.  He is able to answer questions but is slow to answer them.  Urine drug screen was obtained by catheterization.  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  Patient denied drug use on initial visit and on this visit.  CMP showed no renal impairment.  There was a CO2 of 10 and elevated anion gap.  Patient was given 1 mg IV Ativan upon arrival to the ED and 1000 mg IV Keppra.  Reevaluation the patient is easily arousable.  He'll be  evaluated and observed in the ED until he is back to baseline.  He'll be discharged home with the same plan as previously indicated on his last visit.  After several hours of observation the patient was noted to be back at baseline per mother.  Mother's bedside at this time and ready to start the patient home.  He will be followed up with by his PCP who manages his Keppra on Thursday.  He was instructed to call tomorrow to try to move the appointment up if possible.  Patient was informed of his positive drug screen.  He states that he does not know why the cocaine metabolites came back positive. He was instructed to discontinue use of illegal drugs.  He was told not to drive or operate [place] until he followed up with eyes doctor.  He was instructed not thalamic or be any high areas alone until he followed up with his doctor.  Patient states he does not drive.    The patient tolerated their visit well.  They were seen and evaluated by the attending physician, Pearlie Oyster, MD who agreed with the assessment and plan.  The patient and / or the family were informed of the results of any tests, a time was given to answer questions, a plan was proposed and they agreed with plan.        FINAL IMPRESSION    No diagnosis found.      DISPOSITION/PLAN   DISPOSITION     PATIENT REFERRED TO:  No follow-up provider specified.    DISCHARGE MEDICATIONS:  New Prescriptions    No medications on file       DISCONTINUED MEDICATIONS:  Discontinued Medications    No medications on file              (Please note that portions of this note were completed with a voice recognition program.  Efforts were made to edit the dictations but occasionally words are mis-transcribed.)    Tonye Royalty, PA-C (electronically signed)            Tonye Royalty, PA-C  08/25/14 2209

## 2014-08-25 NOTE — ED Notes (Signed)
Pt sleeping after medication, resp easy and even, no seizures noted. SR on monitor. Pt's father, Greggory StallionGeorge was here to see pt, but he was sleeping.    Avanell ShackletonJessica J Ted Leonhart, RN  08/25/14 [phone]

## 2014-08-25 NOTE — ED Notes (Signed)
Discharge and education instructions reviewed; teach-back successful. Pt's medication list reviewed; pt stated good understanding. Pt denied questions at this time and agreed pain was addressed satisfactorily. No acute distress noted. Pt instructed regarding follow up care; pt stated good understanding. Pt instructed to returned to the emergency department if symptoms worsen. Pt discharged per ED MD orders.  Written discharge instructions provided.    Avanell ShackletonJessica J Aveion Nguyen, RN  08/25/14 2207

## 2014-08-25 NOTE — ED Notes (Signed)
Bed: A-16  Expected date:   Expected time:   [facility]  Comments:  28 yr old/sz

## 2014-08-25 NOTE — ED Provider Notes (Signed)
[facility]  [address]  [place] [id number]  Dept: [phone]  Loc: [phone]  eMERGENCY dEPARTMENT eNCOUnter      Pt Name: Roy Weaver  MRN: [id number]  Birthdate 02-Jan-1986  Date of evaluation: 08/25/2014  Provider: Tonye Royaltyhelsea, Burns, PA-C    CHIEF COMPLAINT       Chief Complaint   Patient presents with   ??? Seizures     [facility] brought pt post siezure per his mother, pt states he did not have one         HISTORY OF PRESENT ILLNESS  (Location/Symptom, Timing/Onset, Context/Setting, Quality, Duration, Modifying Factors, Severity.)   Roy Weaver is a 29 y.o. male who presents to the emergency department with possible seizure. Patient denies seizure and states his mom called EMS because she thought he was having one. Patient states he went upstairs to use the bathroom and instead took a nap on his bed. When his mom came upstairs patient state he thinks she saw him sleeping and mistook that for a seizure. Patient has a history of seizures. He just had a medication change 2 weeks ago. His last seizure was 2 weeks ago. He had his Keppra altered from 1500-750mg  He states he is compliant with his medication. He states he has a follow up appointment with Dr. Zola ButtonHaq on Thursday. We are unsure if there was an actual witnessed seizure at this point. Mother is not bedside.  Patient denies being postictal, biting hit tongue/blood in mouth, incontinence. He denies any cp, sob, h/a, visual changes.       Nursing Notes were reviewed and agreed with or any disagreements were addressed in the HPI.    REVIEW OF SYSTEMS    (2-9 systems for level 4, 10 or more for level 5)     Review of Systems   Constitutional: Negative for fever and fatigue.   HENT: Negative for mouth sores (denies blood in mouth/biting tongue).    Gastrointestinal: Negative for nausea, vomiting and abdominal pain.   Genitourinary:        Denies incontinence   Neurological: Negative for dizziness, seizures  (unsure at this time, pt denies), syncope, light-headedness and headaches.   Psychiatric/Behavioral: Negative for behavioral problems and confusion.       Except as noted above the remainder of the review of systems was reviewed and negative.       PAST MEDICAL HISTORY         Diagnosis Date   ??? Seizures (HCC)    ??? Migraine    ??? Hypertension    ??? Pneumonia    ??? Psychiatric problem        SURGICAL HISTORY     History reviewed. No pertinent past surgical history.    CURRENT MEDICATIONS       Discharge Medication List as of 08/25/2014  3:54 PM      CONTINUE these medications which have NOT CHANGED    Details   levETIRAcetam (KEPPRA) 1000 MG tablet Take 1.5 tablets by mouth 2 times daily, Disp-60 tablet, R-1      nicotine (NICODERM CQ) 21 MG/24HR Place 1 patch onto the skin every 24 hours., Disp-30 patch, R-3             ALLERGIES     Review of patient's allergies indicates no known allergies.    FAMILY HISTORY           Problem Relation Age of Onset   ???  Diabetes Father      Family Status   Relation Status Death Age   ??? Father Alive    ??? Mother Alive         SOCIAL HISTORY      reports that he has been smoking Cigarettes.  He has a 1 pack-year smoking history. He quit smokeless tobacco use about 10 months ago. He reports that he drinks alcohol. He reports that he uses illicit drugs (Marijuana).    PHYSICAL EXAM    (up to 7 for level 4, 8 or more for level 5)   ED Triage Vitals   Enc Vitals Group      BP --       Pulse 08/25/14 1456 90      Resp --       Temp 08/25/14 1456 98.2 ??F (36.8 ??C)      Temp Source 08/25/14 1456 Oral      SpO2 --       Weight 08/25/14 1456 185 lb (83.915 kg)      Height 08/25/14 1456  (1.93 m)      Head Cir --       Peak Flow --       Pain Score --       Pain Loc --       Pain Edu? --       Excl. in GC? --        Physical Exam   Constitutional: He is oriented to person, place, and time. He appears well-developed and well-nourished.   HENT:   Head: Atraumatic.   Mouth/Throat: Oropharynx is  clear and moist.   No blood in mouth,  No tongue laceration   Eyes: Conjunctivae are normal. Right eye exhibits no discharge. Left eye exhibits no discharge.   Neck: Normal range of motion. Neck supple.   Pulmonary/Chest: Effort normal and breath sounds normal. No respiratory distress.   Abdominal: Soft. There is no tenderness.   Genitourinary:   No incontinence   Musculoskeletal: Normal range of motion.   Neurological: He is alert and oriented to person, place, and time.   Skin: Skin is warm and dry. He is not diaphoretic. No pallor.   Psychiatric: He has a normal mood and affect. His behavior is normal.   Vitals reviewed.        DIAGNOSTIC RESULTS     EKG: All EKG's are interpreted by the Emergency Department Physician who either signs or Co-signs this chart in the absence of a cardiologist.    RADIOLOGY:   Non-plain film images such as CT, Ultrasound and MRI are read by the radiologist. Plain radiographic images are visualized and preliminarily interpreted by the emergency physician with the below findings:    Interpretation per the Radiologist below, if available at the time of this note:    No orders to display         LABS:  Labs Reviewed - No data to display    All other labs were within normal range or not returned as of this dictation.    EMERGENCY DEPARTMENT COURSE and DIFFERENTIAL DIAGNOSIS/MDM:   Vitals:    Filed Vitals:    08/25/14 1456 08/25/14 1600   BP:  127/81   Pulse: 90 82   Temp: 98.2 ??F (36.8 ??C)    TempSrc: Oral    Resp:  15   Height:  (1.93 m)    Weight: 185 lb (83.915 kg)    SpO2:  97%  MDM    Patient presents to the ED with the HPI noted above. Patient has had recent change in his Keppra. He was initially on 1500 mg then decreased to 750 mg, then increased to 1000 mg. He as only been taking 750 mg still though. He was encourage to increase his Keppra back to 1000 mg. He wants to go home, he states he did not have a seizure and that his mother was confused. No additional workup is  warranted at this time.  Patient denies any alcohol or drug use. Patient was instructed not to operate vehicles, swim alone or climb heights until he follows up with his neurologist.  Patient's mother will drive the patient home.    CONSULTS:  None    PROCEDURES:  Procedures    FINAL IMPRESSION      1. History of seizure    2. Medication dose decreased          DISPOSITION/PLAN   DISPOSITION Decision to Discharge    PATIENT REFERRED TO:  [facility] Emergency Dept  [address] [place] [address]  [phone]    If symptoms worsen    Nisar Karmen Stabs, MD  [address] [place] [address]  [phone]      For follow up    Your scheduled appointment next thursday            DISCHARGE MEDICATIONS:  Discharge Medication List as of 08/25/2014  3:54 PM          (Please note that portions of this note were completed with a voice recognition program.  Efforts were made to edit the dictations but occasionally words are mis-transcribed.)    Tonye Royalty, PA-C          [place], PA-C  08/26/14 0241    Tonye Royalty, PA-C  08/26/14 [phone]

## 2014-08-25 NOTE — ED Notes (Signed)
Pt arrived back to ER after witnessed seizure after DC. Pt arouses and is oriented to person, place and year. Resp easy and even, ST on monitor. Pt's pants wet with urine and small amount of stool in underwear.    Avanell ShackletonJessica J Norma Montemurro, RN  08/25/14 [phone]

## 2014-08-25 NOTE — ED Notes (Signed)
Dr Renaee MundaAdler to see patient.  Patient given phone to call family.    Trellis PaganiniMelissa Chantele Corado, RN  08/25/14 [phone]

## 2014-08-25 NOTE — ED Provider Notes (Signed)
I independently evaluated and obtained a history and physical on Annell Greeningrnest J Mcclune.    All diagnostic, treatment, and disposition assistants were made to myself in conjunction the advanced practice provider.    For further details of this patient's emergency department encounter, please see the advanced practice provider's documentation.    History: Patient with history of seizure disorder, medication noncompliance and paralyze seen by myself and the PA earlier today for concern over seizure that was not substantiated.  Please see separate note.  Patient was being transported home by his mother when he had a seizure in the car.  He took 750 mg of Keppra this morning as discussed in the note from his previous visit.  He had been advised to resume his Keppra at the higher dose upon discharge home.  At this time he is postictal, sleeping comfortably, but easily arousable.  No acute distress.    Physician Exam: Sleeping, arousable, no acute distress.  Heart is regular rate and rhythm, no murmur rub or gallop.  Lungs clear to auscultation bilaterally.  Neurologic exam is nonfocal.    Patient was medicated with Ativan upon arrival to the ED to prevent further seizure.  He was also loaded on Keppra.  Laboratory exam demonstrates low Keppra level as expected, drug screen shows marijuana as well as cocaine, otherwise no significant laboratory abnormalities.  Patient recovered to baseline after ED observation, and was deemed safe to be discharged home.  He has neurology follow-up on Thursday.    Pearlie OysterJeffrey B Markeis Allman, MD  08/26/14 [phone]

## 2014-08-26 DIAGNOSIS — R569 Unspecified convulsions: Secondary | ICD-10-CM

## 2014-08-26 NOTE — ED Notes (Signed)
Here last night for seizures. Given a dose of Keppra here and sent home. States took one dose of Keppra at home. Had seizure tonight. Squad states possible issue with non-compliance    Vance GatherWilliam M Xzandria Clevinger, RN  08/26/14 2142

## 2014-08-26 NOTE — ED Notes (Signed)
Found leaning over side of bed. Drooling. HR up to 127. Awake, but post ictal.  MD made aware.    Vance GatherWilliam M Trajan Grove, RN  08/26/14 [phone]

## 2014-08-26 NOTE — ED Notes (Signed)
Bed: B-32  Expected date: 08/26/14  Expected time:   [facility]  Comments:  [age] M seizure  Post ictal

## 2014-08-26 NOTE — ED Notes (Signed)
Unable to tolerate IV puncture. Ativan 2mg  IM given.    Vance GatherWilliam M Tinsleigh Slovacek, RN  08/26/14 [phone]

## 2014-08-27 ENCOUNTER — Inpatient Hospital Stay: Admit: 2014-08-27 | Discharge: 2014-08-27 | Attending: Emergency Medicine

## 2014-08-27 LAB — COMPREHENSIVE METABOLIC PANEL
ALT: 10 U/L (ref 10–40)
AST: 23 U/L (ref 15–37)
Albumin/Globulin Ratio: 1.2 (ref 1.1–2.2)
Albumin: 4.3 g/dL (ref 3.4–5.0)
Alkaline Phosphatase: 93 U/L (ref 40–129)
Anion Gap: 15 (ref 3–16)
BUN: 10 mg/dL (ref 7–20)
CO2: 24 mmol/L (ref 21–32)
Calcium: 8.9 mg/dL (ref 8.3–10.6)
Chloride: 100 mmol/L (ref 99–110)
Creatinine: 0.8 mg/dL — ABNORMAL LOW (ref 0.9–1.3)
GFR African American: >60 (ref >60)
GFR Non-African American: >60 (ref >60)
Globulin: 3.5 g/dL
Glucose: 94 mg/dL (ref 70–99)
Potassium: 4 mmol/L (ref 3.5–5.1)
Sodium: 139 mmol/L (ref 136–145)
Total Bilirubin: 0.8 mg/dL (ref 0.0–1.0)
Total Protein: 7.8 g/dL (ref 6.4–8.2)

## 2014-08-27 LAB — CBC WITH AUTO DIFFERENTIAL
Basophils %: 1.2 %
Basophils Absolute: 0.1 10*3/uL (ref 0.0–0.2)
Eosinophils %: 3.3 %
Eosinophils Absolute: 0.1 10*3/uL (ref 0.0–0.6)
Hematocrit: 43.4 % (ref 40.5–52.5)
Hemoglobin: 14.6 g/dL (ref 13.5–17.5)
Lymphocytes %: 26.4 %
Lymphocytes Absolute: 1.2 10*3/uL (ref 1.0–5.1)
MCH: 30.3 pg (ref 26.0–34.0)
MCHC: 33.5 g/dL (ref 31.0–36.0)
MCV: 90.4 fL (ref 80.0–100.0)
MPV: 7.5 fL (ref 5.0–10.5)
Monocytes %: 8.2 %
Monocytes Absolute: 0.4 10*3/uL (ref 0.0–1.3)
Neutrophils %: 60.9 %
Neutrophils Absolute: 2.7 10*3/uL (ref 1.7–7.7)
Platelets: 204 10*3/uL (ref 135–450)
RBC: 4.8 M/uL (ref 4.20–5.90)
RDW: 14 % (ref 12.4–15.4)
WBC: 4.4 10*3/uL (ref 4.0–11.0)

## 2014-08-27 LAB — MAGNESIUM: Magnesium: 2.4 mg/dL (ref 1.80–2.40)

## 2014-08-27 LAB — LEVETIRACETAM LEVEL: Levetiracetam Lvl: 10.3 ug/mL (ref 6.0–46.0)

## 2014-08-27 MED ORDER — LORAZEPAM 2 MG/ML IJ SOLN
2 MG/ML | INTRAMUSCULAR | Status: DC
Start: 2014-08-27 — End: 2014-08-27

## 2014-08-27 MED ORDER — LEVETIRACETAM IN NACL 1000 MG/100ML IV SOLN
1000 MG/100ML | Freq: Once | INTRAVENOUS | Status: AC
Start: 2014-08-27 — End: 2014-08-26
  Administered 2014-08-27: 04:00:00 1000 mg via INTRAVENOUS

## 2014-08-27 MED ORDER — SODIUM CHLORIDE 0.9 % IV BOLUS
0.9 % | Freq: Once | INTRAVENOUS | Status: AC
Start: 2014-08-27 — End: 2014-08-27
  Administered 2014-08-27: 04:00:00 1000 mL via INTRAVENOUS

## 2014-08-27 MED ORDER — LEVETIRACETAM 500 MG PO TABS
500 MG | Freq: Once | ORAL | Status: AC
Start: 2014-08-27 — End: 2014-08-27
  Administered 2014-08-27: 12:00:00 1500 mg via ORAL

## 2014-08-27 MED ORDER — LORAZEPAM 2 MG/ML IJ SOLN
2 MG/ML | Freq: Once | INTRAMUSCULAR | Status: AC
Start: 2014-08-27 — End: 2014-08-26
  Administered 2014-08-27: 03:00:00 2 mg via INTRAMUSCULAR

## 2014-08-27 MED FILL — LEVETIRACETAM 500 MG PO TABS: 500 MG | ORAL | Qty: 3

## 2014-08-27 MED FILL — LORAZEPAM 2 MG/ML IJ SOLN: 2 MG/ML | INTRAMUSCULAR | Qty: 1

## 2014-08-27 MED FILL — SODIUM CHLORIDE 0.9 % IV SOLN: 0.9 % | INTRAVENOUS | Qty: 1000

## 2014-08-27 MED FILL — LEVETIRACETAM IN NACL 1000 MG/100ML IV SOLN: 1000 MG/100ML | INTRAVENOUS | Qty: 100

## 2014-08-27 NOTE — ED Notes (Signed)
Resting quietly. Resps easy and regular.      Vance GatherWilliam M Labresha Mellor, RN  08/27/14 [phone]

## 2014-08-27 NOTE — ED Notes (Signed)
Assuming care of patient, patient awaiting ride for pick up    Roy PenAmanda Marie Rickie Gutierres, RN  08/27/14 1240

## 2014-08-27 NOTE — ED Notes (Signed)
Message left on Mom's cellphone to pick patient up.    Vance GatherWilliam M Aryelle Figg, RN  08/27/14 [phone]

## 2014-08-27 NOTE — Discharge Instructions (Signed)
Seizure: Care Instructions  Your Care Instructions     Seizures are caused by abnormal patterns of electrical signals in the brain. They are different for each person.  Seizures can affect movement, speech, vision, or awareness. Some people have only slight shaking of a hand and do not pass out. Other people may pass out and have violent shaking of the whole body. Some people appear to stare into space. They are awake, but they can't respond normally. Later, they may not remember what happened.  You may need tests to identify the type and cause of the seizures.  A seizure may occur only once, or you may have them more than one time. Taking medicines as directed and following up with your doctor may help keep you from having more seizures.  The doctor has checked you carefully, but problems can develop later. If you notice any problems or new symptoms, get medical treatment right away.  Follow-up care is a key part of your treatment and safety. Be sure to make and go to all appointments, and call your doctor if you are having problems. It's also a good idea to know your test results and keep a list of the medicines you take.  How can you care for yourself at home?   Be safe with medicines. Take your medicines exactly as prescribed. Call your doctor if you think you are having a problem with your medicine.   Do not do any activity that could be dangerous to you or others until your doctor says it is safe to do so. For example, do not drive a car, operate machinery, swim, or climb ladders.   Be sure that anyone treating you for any health problem knows that you have had a seizure and what medicines you are taking for it.   Identify and avoid things that may make you more likely to have a seizure. These may include lack of sleep, alcohol or drug use, stress, or not eating.   Make sure you go to your follow-up appointment.  When should you call for help?  Call 911 anytime you think you may need emergency care. For  example, call if:   You have another seizure.   You have more than one seizure in 24 hours.   You have new symptoms, such as trouble walking, speaking, or thinking clearly.  Call your doctor now or seek immediate medical care if:   You are not acting normally.  Watch closely for changes in your health, and be sure to contact your doctor if you have any problems.   Where can you learn more?   Go to [email or web address] and sign in to your MyChart account. Enter M769 in the Search Health Information box to learn more about "Seizure: Care Instructions."    If you do not have an account, please click on the "Sign Up Now" link.      2006-2015 [employer]. Care instructions adapted under license by [facility]. This care instruction is for use with your licensed healthcare professional. If you have questions about a medical condition or this instruction, always ask your healthcare professional. Healthwise, Incorporated disclaims any warranty or liability for your use of this information.  Content Version: 10.6.465758; Current as of: July 13, 2013

## 2014-08-27 NOTE — ED Notes (Signed)
Awake, oriented, no complaints.    Vance GatherWilliam M Neidra Girvan, RN  08/27/14 [phone]

## 2014-08-27 NOTE — ED Notes (Signed)
Called mother again, mother arranged ride for pickup.  Patient to be discharged.    Cordelia Penmanda Marie Jermya Dowding, RN  08/27/14 [phone]

## 2014-08-27 NOTE — ED Provider Notes (Signed)
[facility] EMERGENCY DEPT  eMERGENCY dEPARTMENT eNCOUnter      Pt Name: Roy Weaver  MRN: [id number]  Birthdate [birth year]  Date of evaluation: 08/26/2014  Provider: Audrie Gallus, MD    CHIEF COMPLAINT       Chief Complaint   Patient presents with   ??? Seizures     here last night for same         HISTORY OF PRESENT ILLNESS   (Location/Symptom, Timing/Onset, Context/Setting, Quality, Duration, Modifying Factors, Severity)  Note limiting factors.   Roy Weaver is a 29 y.o. male who presents to the emergency department with repeated seizures secondary to medication noncompliance. Patient actually seen in the emergency department earlier today. Discharged with instructions to take his seizure medication when he got home. Patient said he initially went to bed and did not take his night dose. Patient has a history of polysubstance abuse and poor compliance with his Keppra. Then on multiple seizure prophylaxis regimens and seems to fill them regularly. On initial examination patient is postictal and is complaining only of being fatigued.    HPI    Nursing Notes were reviewed.    REVIEW OF SYSTEMS    (2-9 systems for level 4, 10 or more for level 5)     Review of Systems   Constitutional: Positive for fatigue. Negative for fever and chills.   HENT: Negative for rhinorrhea and sore throat.    Respiratory: Negative.  Negative for cough, chest tightness and shortness of breath.    Cardiovascular: Negative.  Negative for chest pain and leg swelling.   Gastrointestinal: Negative.  Negative for nausea, vomiting, abdominal pain, diarrhea and constipation.   Genitourinary: Negative for dysuria and urgency.   Musculoskeletal: Negative.  Negative for gait problem.   Skin: Negative.  Negative for rash.   Allergic/Immunologic: Negative for environmental allergies and food allergies.   Neurological: Positive for seizures. Negative for weakness and numbness.       Except as noted above the remainder of the review of systems was  reviewed and negative.       PAST MEDICAL HISTORY     Past Medical History   Diagnosis Date   ??? Seizures (HCC)    ??? Migraine    ??? Hypertension    ??? Pneumonia    ??? Psychiatric problem          SURGICAL HISTORY     History reviewed. No pertinent past surgical history.      CURRENT MEDICATIONS       Discharge Medication List as of 08/27/2014  7:54 AM      CONTINUE these medications which have NOT CHANGED    Details   levETIRAcetam (KEPPRA) 1000 MG tablet Take 1.5 tablets by mouth 2 times daily, Disp-60 tablet, R-1             ALLERGIES     Review of patient's allergies indicates no known allergies.    FAMILY HISTORY       Family History   Problem Relation Age of Onset   ??? Diabetes Father           SOCIAL HISTORY       History     Social History   ??? Marital Status: Single     Spouse Name: N/A     Number of Children: 0   ??? Years of Education: 59     Social History Main Topics   ??? Smoking status: Current Every Day Smoker -- 0.50  packs/day for 2 years     Types: Cigarettes   ??? Smokeless tobacco: Former NeurosurgeonUser     Quit date: 10/04/2013   ??? Alcohol Use: 0.0 oz/week     0 Not specified per week   ??? Drug Use: Yes     Special: Marijuana      Comment: smoked marijuana yesterday (06/26/2014)   ??? Sexual Activity: None     Other Topics Concern   ??? None     Social History Narrative    ** Merged History Encounter **            SCREENINGS             PHYSICAL EXAM    (up to 7 for level 4, 8 or more for level 5)   ED Triage Vitals   BP Temp Temp src Pulse Resp SpO2 Height Weight   08/26/14 2138 08/26/14 2138 -- 08/26/14 2138 08/26/14 2138 08/26/14 2138 08/26/14 2138 08/26/14 2138   120/99 mmHg 98.9 ??F (37.2 ??C)  84 16 96 % 6\' 4"  (1.93 m) 182 lb (82.555 kg)       Physical Exam   Constitutional: He appears well-developed and well-nourished. No distress.   Well Appearing   HENT:   Head: Normocephalic and atraumatic.   Mouth/Throat: Oropharyngeal exudate present.   Eyes: Right eye exhibits no discharge. Left eye exhibits no discharge. No scleral  icterus.   Neck: Normal range of motion. Neck supple.   Cardiovascular: Normal rate, regular rhythm and normal heart sounds.    Pulmonary/Chest: Effort normal and breath sounds normal. No respiratory distress. He has no wheezes. He has no rales.   Abdominal: Soft. Bowel sounds are normal. He exhibits no distension. There is no tenderness. There is no rebound.   Non-tender   Lymphadenopathy:     He has no cervical adenopathy.   Neurological:   No focal neurologic deficit, patient appears postictal with mild confusion and decreased GCS. Patient had seizure right before I came in to him and was given Ativan 2 mg IM. This explains his somnolence at this time.   Skin: Skin is warm and dry.   Nursing note and vitals reviewed.      DIAGNOSTIC RESULTS     EKG: All EKG's are interpreted by the Emergency Department Physician who either signs or Co-signs this chart in the absence of a cardiologist.              RADIOLOGY:   Non-plain film images such as CT, Ultrasound and MRI are read by the radiologist. Plain radiographic images are visualized and preliminarily interpreted by the emergency physician with the below findings:        Interpretation per the Radiologist below, if available at the time of this note:    No orders to display         ED BEDSIDE ULTRASOUND:   Performed by ED Physician - none    LABS:  Labs Reviewed   COMPREHENSIVE METABOLIC PANEL - Abnormal; Notable for the following:     CREATININE 0.8 (*)     All other components within normal limits   CBC WITH AUTO DIFFERENTIAL   MAGNESIUM   LEVETIRACETAM LEVEL   URINE RT REFLEX TO CULTURE       All other labs were within normal range or not returned as of this dictation.    EMERGENCY DEPARTMENT COURSE and DIFFERENTIAL DIAGNOSIS/MDM:   Vitals:    Filed Vitals:    08/27/14 0108 08/27/14  0224 08/27/14 0530 08/27/14 0600   BP: 105/60 114/75 125/79 147/81   Pulse: 67 65 73 89   Temp:       Resp: Height:       Weight:       SpO2: 97% 98% 97% 100%            MDM  Number of Diagnoses or Management Options  Convulsions, unspecified convulsion type [facility]):   Diagnosis management comments: 29 year old male with chronic noncompliance of his seizure medication as well as polysubstance abuse. Patient observed in the ED overnight and given IV dose of Keppra. Patient's level therapeutic at that time. Given another dose of oral Keppra and encouraged to be more compliant with his home dosing. Good relationship with his neurologist and will follow-up as soon as possible.      CRITICAL CARE TIME   Total Critical Care time was zero minutes, excluding separately reportable procedures.  There was a high probability of clinically significant/life threatening deterioration in the patient's condition which required my urgent intervention.      CONSULTS:  None    PROCEDURES:  Unless otherwise noted below, none     Procedures    FINAL IMPRESSION      1. Convulsions, unspecified convulsion type [facility])          DISPOSITION/PLAN   DISPOSITION Decision to Discharge    PATIENT REFERRED TO:  Baldwin Jamaica, MD  [address] [place] [address]  [phone]    Schedule an appointment as soon as possible for a visit in 2 days  As needed      DISCHARGE MEDICATIONS:  Discharge Medication List as of 08/27/2014  7:54 AM             (Comment: Please note this report has been produced using speech recognition software and may contain errors related to that system including errors in grammar, punctuation, and spelling, as well as words and phrases that may be inappropriate. If there are any questions or concerns please feel free to contact the dictating physician for clarification.)    Audrie Gallus, MD (electronically signed)  Attending Emergency Physician          Audrie Gallus, MD  08/27/14 [phone]

## 2014-08-29 ENCOUNTER — Ambulatory Visit
Admit: 2014-08-29 | Discharge: 2014-08-29 | Payer: PRIVATE HEALTH INSURANCE | Attending: Internal Medicine | Primary: Internal Medicine

## 2014-08-29 DIAGNOSIS — Z09 Encounter for follow-up examination after completed treatment for conditions other than malignant neoplasm: Secondary | ICD-10-CM

## 2014-08-29 MED ORDER — VARENICLINE TARTRATE 0.5 MG X 11 & 1 MG X 42 PO MISC
0.5 MG X 11 & 1 MG X 42 | ORAL_TABLET | ORAL | Status: DC
Start: 2014-08-29 — End: 2014-09-26

## 2014-08-29 MED ORDER — LEVETIRACETAM 1000 MG PO TABS
1000 MG | ORAL_TABLET | Freq: Two times a day (BID) | ORAL | Status: DC
Start: 2014-08-29 — End: 2014-09-22

## 2014-08-29 MED ORDER — BUPROPION HCL 75 MG PO TABS
75 MG | ORAL_TABLET | Freq: Two times a day (BID) | ORAL | Status: DC
Start: 2014-08-29 — End: 2014-09-26

## 2014-08-29 NOTE — Progress Notes (Signed)
Subjective:      Patient ID: Roy Weaver is a 29 y.o. male.    HPI Comments: 08/29/14 Patient presents with:  Follow-Up from Hospital  Was in ER 4/3 and  Again 4/4 .     Roy Weaver is a 29 y.o. male who presented to the emergency department with repeated seizures secondary to medication noncompliance. Patient was actually seen in the emergency department earlier today. Discharged with instructions to take his seizure medication when he got home. Patient said he initially went to bed and did not take his night dose. Patient has a history of polysubstance abuse and poor compliance with his Keppra. Then on multiple seizure prophylaxis regimens and seems to fill them regularly.             Review of Systems   Constitutional: Negative for fever, chills and fatigue.        All vaccinations complete    HENT: Negative.    Eyes: Negative.         Glasses ; Eye ex 2/14    Respiratory: Negative for cough, chest tightness, shortness of breath and wheezing.         Smokes 1 ppd ; Not a heavy drinker ; Marijuana occ     No Asthma    Cardiovascular: Negative.         Dad has HTN    Gastrointestinal: Negative.  Negative for diarrhea, constipation and blood in stool.        No FH of ca colon    Endocrine:        Dad has Diabetes    Genitourinary: Negative for dysuria, urgency and frequency.   Musculoskeletal: Negative.    Skin: Negative for rash.   Neurological: Positive for seizures. Negative for dizziness, tremors, weakness, light-headedness and headaches.   Psychiatric/Behavioral: Negative for behavioral problems and sleep disturbance. The patient is nervous/anxious.         Feels stressed and smokes more       Objective:   Physical Exam   Constitutional: He is oriented to person, place, and time.   Eyes: Conjunctivae are normal.   Neck: Neck supple.   Cardiovascular: Normal rate, regular rhythm and normal heart sounds.    Pulmonary/Chest: Breath sounds normal.   Abdominal: Soft.   Musculoskeletal: Normal range of motion.    Neurological: He is alert and oriented to person, place, and time.   Skin: Skin is warm.   Psychiatric: He has a normal mood and affect. His behavior is normal.       Assessment:     Roy Weaver was seen today for follow-up from hospital.    Diagnoses and associated orders for this visit:    Hospital discharge follow-up   stable exam       Seizure disorder (HCC)  . On higher dose of Kepra . Take it reg bid   - levETIRAcetam (KEPPRA) 1000 MG tablet; Take 1.5 tablets by mouth 2 times daily    Smoker  To start Chantix . wellbutrin for anxiety   - varenicline (CHANTIX STARTING MONTH PAK) 0.5 MG X 11 & 1 MG X 42 tablet; Take by mouth.  - buPROPion (WELLBUTRIN) 75 MG tablet; Take 1 tablet by mouth 2 times daily          Plan:      Self Management Goals    Know which medication is for what condition:   Know side effects of medications, and discuss with doctor  Discuss side effects and instructions on new medications. Barriers to medication compliance addressed.  All patient questions answered.  Pt voiced understanding.  Know correct dose/frequency of medications  Take medications at the same time each day  Stay current on medication refills  If taking OTC's check with MD/pharmacy first about interactions    Systolic BP < or equal to 140  Diastolic BP < or equal to 85  Quota system,X  number of cigarettes per day  Nicoderm and or Chantix  Current Flu and Pneumonia Vax  Set targets for weight loss 4 lbs per month  Exercise 3-5 times per week

## 2014-09-04 NOTE — Telephone Encounter (Signed)
Call [facility]. Got rx for Keppra 1.5 tablets 2 times a day #60. Wanted to know if it was suppose to be for a 20day supply or can it be change to #90 for 30 day supply. Per Dr. Zola ButtonHaq, ok to change to #90 for 30 day supply. [facility] and gave them new qty.

## 2014-09-21 ENCOUNTER — Inpatient Hospital Stay: Admit: 2014-09-21 | Discharge: 2014-09-22 | Attending: Emergency Medicine

## 2014-09-21 DIAGNOSIS — R569 Unspecified convulsions: Secondary | ICD-10-CM

## 2014-09-21 NOTE — ED Notes (Signed)
Pt. Resting comfortably, no seizure activity at this time, VSSA on room air      Donivan ScullLara N Dalis Beers, RN  09/21/14 2356

## 2014-09-21 NOTE — ED Notes (Signed)
Pt. Alert and response.  Pt. Removed nasal canula due to discomfort.  Pt. Provided with warm blankets    Donivan ScullLara N Suellen Durocher, RN  09/21/14 2104

## 2014-09-21 NOTE — ED Notes (Signed)
[facility], pt. Sedated at time of arrival.  Per squad pt was having seizures at home, hx of seizure disorder and medication noncompliance.  Per squad pt. Had grand mal seizure on squad ride to ED.  EMS unable to obtain IV access, pt. Administered 5 mg IM versed by EMS.  Upon arrival pt. Sedated, responsive to pain, no active seizing.  Seizure pads in place, suction in place.  Pt. SpO2 at 86% upon arrival.  Pt. Placed on 4L O2 via NC.  BP low, ER MD aware    Donivan ScullLara N Zykerria Tanton, RN  09/21/14 2005

## 2014-09-21 NOTE — ED Notes (Signed)
Pt. SpO2 <92%, O2 increased to 5L via NC    Donivan ScullLara N [place] Warriner, [place]  09/21/14 2051

## 2014-09-21 NOTE — ED Notes (Signed)
Pt. Alert and oriented, mother bedside.  VSSA, no seizure activity at this time    [name], RN  09/21/14 [phone]

## 2014-09-21 NOTE — ED Notes (Signed)
Pt. And mother updated on plan of care, ER MD to bedside.      Donivan ScullLara N Samanthajo Payano, RN  09/21/14 [phone]

## 2014-09-21 NOTE — ED Provider Notes (Signed)
[facility]  Emergency Department    CHIEF COMPLAINT  Seizures      HISTORY OF PRESENT ILLNESS  Annell Greeningrnest J Sween is a 29 y.o. male  who presents to the ED by ambulance following a seizure.  History is obtained from the patient as well as EMS.  He was lying in bed when he suddenly lost consciousness and apparently developed tonic-clonic motor activity consistent with prior seizures.  His mother called EMS and they arrived and found him actively seizing.  They administered 2 mg of Versed intramuscularly and the patient's seizure stopped.  He arrived to our emergency department groggy but appropriate.  He says that he has had similar seizures many times in the past.  He is supposed to be on Keppra for seizures but ran out and did not take his dose today.  He otherwise feels well.    Patient has a history of a seizure disorder.  He is not having surgeries.  He smokes cigarettes.  He does not drink alcohol or use illicit drugs.    I have reviewed the following from the nursing documentation.    Past Medical History   Diagnosis Date   ??? Seizures (HCC)    ??? Migraine    ??? Hypertension    ??? Pneumonia    ??? Psychiatric problem      No past surgical history on file.  Family History   Problem Relation Age of Onset   ??? Diabetes Father      History     Social History   ??? Marital Status: Single     Spouse Name: N/A     Number of Children: 0   ??? Years of Education: 14     Occupational History   ??? Not on file.     Social History Main Topics   ??? Smoking status: Current Every Day Smoker -- 0.50 packs/day for 2 years     Types: Cigarettes   ??? Smokeless tobacco: Former NeurosurgeonUser     Quit date: 10/04/2013   ??? Alcohol Use: 0.0 oz/week     0 Not specified per week   ??? Drug Use: Yes     Special: Marijuana      Comment: smoked marijuana yesterday (06/26/2014)   ??? Sexual Activity: Not on file     Other Topics Concern   ??? Not on file     Social History Narrative    ** Merged History Encounter **          No current facility-administered  medications for this encounter.     Current Outpatient Prescriptions   Medication Sig Dispense Refill   ??? levETIRAcetam (KEPPRA) 1000 MG tablet Take 1 tablet by mouth 2 times daily 28 tablet 0   ??? varenicline (CHANTIX STARTING MONTH PAK) 0.5 MG X 11 & 1 MG X 42 tablet Take by mouth. 42 tablet 1   ??? buPROPion (WELLBUTRIN) 75 MG tablet Take 1 tablet by mouth 2 times daily 60 tablet 3     No Known Allergies    REVIEW OF SYSTEMS  10 systems reviewed, pertinent positives per HPI otherwise noted to be negative.  No headache no fever no chills no cough no abdominal pain patient does not think he bit his tongue or lost control of his bladder.    PHYSICAL EXAM  BP 137/73 mmHg   Pulse 100   Temp(Src) 98.4 ??F (36.9 ??C) (Axillary)   Resp 18   SpO2 98%  Gen - Patient  is slightly drowsy but arousable to verbal stimuli and appropriate and in no acute distress.  HEENT -there is a small abrasion over the patient's upper lip that appears old.  There is no obvious head trauma otherwise.  There is no conjunctival pallor or scleral icterus.  CV - S1S2 regular rate and rhythm.  Peripheral pulses bounding.  Resp - Breathing is nonlabored.  Lungs are clear to ascultation, bilaterally.  There is no cyanosis.  GI - Abdomen is soft and nontender, without guarding or rebound tenderness.  Ext - Present extremities are atraumatic.  Neuro - Patient is alert and oriented to person, place and timeThere is no facial asymmetry.Facial sensation is symmetric.  Extraocular motion is intact.  Tongue protrudes in midline.  Palate elevates symmetrically.  Shoulder shrug is equal bilaterally.  Muscle strength is 5 out of 5 in all present extremities.  Extremity sensation is symmetric.  There is no finger to nose abnormality.  Psych - Patient has appropriate affect.      ED COURSE/MDM  12:15 AM  Patient has remained seizure-free during her period of observation the emergency department.  His mother is now at bedside.  She says that he only gets seizures when  he doesn't take his seizure medication and that he has been poorly compliant with his seizure medication recently.  She is quite upset with him for not taking his seizure medication and I reiterated her concerns the patient that he needs to continue taking his medications prevent possible prolonged seizures leading to brain damage or death.  There is some discrepancy between patient and his mother as to the dose of Keppra she is supposed to be taking.  As he has been poorly compliant with the Keppra and his mother thinks he is supposed to be on 1000 mg twice a day and I will start him on the more conservative dose but encouraged him to follow up with his neurologist this week for further dosing recommendations.  He'll return for any new concerns or repeat seizures.  He will not drive until cleared by his neurologist.    CLINICAL IMPRESSION  1. Convulsions, unspecified convulsion type (HCC)          DISPOSITION  RAMON ZANDERS was discharged in stable condition.       Lyda Perone, DO  09/22/14 0017

## 2014-09-21 NOTE — ED Notes (Signed)
Bed: S-49  Expected date: 09/21/14  Expected time: 7:30 PM  [facility]  Comments:  [age] M seizure   VSS

## 2014-09-22 MED ORDER — LEVETIRACETAM IN NACL 1000 MG/100ML IV SOLN
1000 MG/100ML | Freq: Once | INTRAVENOUS | Status: AC
Start: 2014-09-22 — End: 2014-09-21
  Administered 2014-09-22: 01:00:00 1000 mg via INTRAVENOUS

## 2014-09-22 MED ORDER — LEVETIRACETAM 1000 MG PO TABS
1000 MG | ORAL_TABLET | Freq: Two times a day (BID) | ORAL | Status: DC
Start: 2014-09-22 — End: 2014-09-26

## 2014-09-22 MED FILL — LEVETIRACETAM IN NACL 1000 MG/100ML IV SOLN: 1000 MG/100ML | INTRAVENOUS | Qty: 100

## 2014-09-26 ENCOUNTER — Ambulatory Visit
Admit: 2014-09-26 | Discharge: 2014-09-26 | Payer: PRIVATE HEALTH INSURANCE | Attending: Internal Medicine | Primary: Internal Medicine

## 2014-09-26 DIAGNOSIS — G40909 Epilepsy, unspecified, not intractable, without status epilepticus: Secondary | ICD-10-CM

## 2014-09-26 MED ORDER — BUPROPION HCL 75 MG PO TABS
75 MG | ORAL_TABLET | Freq: Two times a day (BID) | ORAL | Status: DC
Start: 2014-09-26 — End: 2015-03-16

## 2014-09-26 MED ORDER — LEVETIRACETAM 1000 MG PO TABS
1000 MG | ORAL_TABLET | Freq: Two times a day (BID) | ORAL | Status: DC
Start: 2014-09-26 — End: 2015-04-14

## 2014-09-26 MED ORDER — VARENICLINE TARTRATE 1 MG PO TABS
1 MG | ORAL_TABLET | Freq: Two times a day (BID) | ORAL | Status: DC
Start: 2014-09-26 — End: 2015-03-16

## 2014-09-26 NOTE — Progress Notes (Signed)
Subjective:      Patient ID: Roy Weaver is a 29 y.o. male.    HPI Comments: 09/26/14 Patient presents with:  Seizures: 1 month follow up . One seizure on weekend when he overslept and missed a dose!   Not smoked in a mth ! Chantix helps!      Last seen 08/29/14   Follow-Up from Hospital  Was in ER 4/3 and  Again 4/4 for seizures           Review of Systems   Constitutional: Negative for fever, chills and fatigue.        All vaccinations complete    HENT: Negative.    Eyes: Negative.         Glasses ; Eye ex 2/14    Respiratory: Negative for cough, chest tightness, shortness of breath and wheezing.         Not smoked in a mth!  Smoked  1 ppd ; Not a heavy drinker ; Marijuana occ     No Asthma    Cardiovascular: Negative.         Dad has HTN    Gastrointestinal: Negative.  Negative for diarrhea, constipation and blood in stool.        No FH of ca colon    Endocrine:        Dad has Diabetes    Genitourinary: Negative for dysuria, urgency and frequency.   Musculoskeletal: Negative.    Skin: Negative for rash.   Neurological: Positive for seizures. Negative for dizziness, tremors, weakness, light-headedness and headaches.   Psychiatric/Behavioral: Negative for behavioral problems and sleep disturbance.       Objective:   Physical Exam   Constitutional: He is oriented to person, place, and time.   Eyes: Conjunctivae are normal.   Neck: Neck supple.   Cardiovascular: Regular rhythm and normal heart sounds.    Pulmonary/Chest: Breath sounds normal.   Musculoskeletal: Normal range of motion.   Neurological: He is alert and oriented to person, place, and time.   Skin: Skin is warm.   Psychiatric: He has a normal mood and affect. His behavior is normal.       Assessment:     [name] was seen today for seizures.    Diagnoses and associated orders for this visit:    Seizure disorder (HCC)  Take med reg   - levETIRAcetam (KEPPRA) 1000 MG tablet; Take 1 tablet by mouth 2 times daily    Smoker  , can switch to cont dose chantix    - varenicline (CHANTIX CONTINUING MONTH PAK) 1 MG tablet; Take 1 tablet by mouth 2 times daily  - buPROPion (WELLBUTRIN) 75 MG tablet; Take 1 tablet by mouth 2 times daily              Plan:      Self Management Goals    Know which medication is for what condition:   Know side effects of medications, and discuss with doctor   Discuss side effects and instructions on new medications. Barriers to medication compliance addressed.  All patient questions answered.  Pt voiced understanding.  Know correct dose/frequency of medications  Take medications at the same time each day  Stay current on medication refills  If taking OTC's check with MD/pharmacy first about interactions    Systolic BP < or equal to 140  Diastolic BP < or equal to 85  Quota system,X  number of cigarettes per day  Nicoderm and or Chantix  Current  Flu and Pneumonia Vax  Set targets for weight loss 4 lbs per month  Exercise 3-5 times per week

## 2014-10-10 ENCOUNTER — Ambulatory Visit
Admit: 2014-10-10 | Discharge: 2014-10-10 | Payer: PRIVATE HEALTH INSURANCE | Attending: Clinical Neurophysiology | Primary: Internal Medicine

## 2014-10-10 DIAGNOSIS — G40219 Localization-related (focal) (partial) symptomatic epilepsy and epileptic syndromes with complex partial seizures, intractable, without status epilepticus: Secondary | ICD-10-CM

## 2014-10-10 NOTE — Patient Instructions (Signed)
Please call with any questions or concerns:    [facility]  @ [phone].    LAB RESULTS:  Please obtain any labs or diagnostic tests as discussed today.   You may call the office to check the results.  Please allow  3 to 7 days for us to get these results.    MEDICATION LIST:  Please bring an accurate list of your medications to every visit.    APPOINTMENT CONFIRMATION:  We will call you the day before your scheduled appointment to confirm.   If we are unable to reach you, you MUST call back by the end of the day to confirm the appointment or we may be forced to cancel.           Stopping Smoking: Care Instructions  Your Care Instructions  Cigarette smokers crave the nicotine in cigarettes. Giving it up is much harder than simply changing a habit. Your body has to stop craving the nicotine. It is hard to quit, but you can do it. There are many tools that people use to quit smoking. You may find that combining tools works best for you.  There are several steps to quitting. First you get ready to quit. Then you get support to help you. After that, you learn new skills and behaviors to become a nonsmoker. For many people, a necessary step is getting and using medicine.  Your doctor will help you set up the plan that best meets your needs. You may want to attend a smoking cessation program to help you quit smoking. When you choose a program, look for one that has proven success. Ask your doctor for ideas. You will greatly increase your chances of success if you take medicine as well as get counseling or join a cessation program.  Some of the changes you feel when you first quit tobacco are uncomfortable. Your body will miss the nicotine at first, and you may feel short-tempered and grumpy. You may have trouble sleeping or concentrating. Medicine can help you deal with these symptoms. You may struggle with changing your smoking habits and rituals. The last step is the tricky one: Be  prepared for the smoking urge to continue for a time. This is a lot to deal with, but keep at it. You will feel better.  Follow-up care is a key part of your treatment and safety. Be sure to make and go to all appointments, and call your doctor if you are having problems. It???s also a good idea to know your test results and keep a list of the medicines you take.  How can you care for yourself at home?  ?? Ask your family, friends, and coworkers for support. You have a better chance of quitting if you have help and support.  ?? Join a support group, such as Nicotine Anonymous, for people who are trying to quit smoking.  ?? Consider signing up for a smoking cessation program, such as the American Lung Association's Freedom from Smoking program.  ?? Set a quit date. Pick your date carefully so that it is not right in the middle of a big deadline or stressful time. Once you quit, do not even take a puff. Get rid of all ashtrays and lighters after your last cigarette. Clean your house and your clothes so that they do not smell of smoke.  ?? Learn how to be a nonsmoker. Think about ways you can avoid those things that make you reach for a cigarette.  ??   Avoid situations that put you at greatest risk for smoking. For some people, it is hard to have a drink with friends without smoking. For others, they might skip a coffee break with coworkers who smoke.  ?? Change your daily routine. Take a different route to work or eat a meal in a different place.  ?? Cut down on stress. Calm yourself or release tension by doing an activity you enjoy, such as reading a book, taking a hot bath, or gardening.  ?? Talk to your doctor or pharmacist about nicotine replacement therapy, which replaces the nicotine in your body. You still get nicotine but you do not use tobacco. Nicotine replacement products help you slowly reduce the amount of nicotine you need. These products come in several forms, many of them available over-the-counter:  ?? Nicotine  patches  ?? Nicotine gum and lozenges  ?? Nicotine inhaler  ?? Ask your doctor about bupropion (Wellbutrin) or varenicline (Chantix), which are prescription medicines. They do not contain nicotine. They help you by reducing withdrawal symptoms, such as stress and anxiety.  ?? Some people find hypnosis, acupuncture, and massage helpful for ending the smoking habit.  ?? Eat a healthy diet and get regular exercise. Having healthy habits will help your body move past its craving for nicotine.  ?? Be prepared to keep trying. Most people are not successful the first few times they try to quit. Do not get mad at yourself if you smoke again. Make a list of things you learned and think about when you want to try again, such as next week, next month, or next year.   Where can you learn more?   Go to [email or web address] and sign in to your MyChart account. Enter Y522 in the Search Health Information box to learn more about ???Stopping Smoking: Care Instructions.???    If you do not have an account, please click on the ???Sign Up Now??? link.     ?? 2006-2015 [employer]wise[employer]. Care instructions adapted under license by [facility]. This care instruction is for use with your licensed healthcare professional. If you have questions about a medical condition or this instruction, always ask your healthcare professional. Healthwise, Incorporated disclaims any warranty or liability for your use of this information.  Content Version: 10.6.465758; Current as of: January 30, 2013

## 2014-10-10 NOTE — Progress Notes (Signed)
Roy GreeningErnest J Weaver   Neurology followup    Subjective:   CC/HP  History was obtained from the patient and his mother  Patient has known partial complex seizures  Patient had a seizure in September 21, 2014.  Apparently patient ran out of medication and had not been taking it for a while.  Patient now is back on medication taking Keppra 1000 mg twice a day  Patient states that he is not driving an automobile now    REVIEW OF SYSTEMS    Constitutional:  [x]    Chills   []   Fatigue   []   Fevers   []   Malaise   [x]   Weight loss     []  Denies all of the above    Respiratory:   []   Cough    []   Shortness of breath         []  Denies all of the above     Cardiovascular:   []   Chest pain    []   Exertional chest pressure/discomfort           []  Palpitations    []   Syncope     [x]  Denies all of the above        Past Medical History   Diagnosis Date   . Seizures (HCC)    . Migraine    . Hypertension    . Pneumonia    . Psychiatric problem      Family History   Problem Relation Age of Onset   . Diabetes Father      History     Social History   . Marital Status: Single     Spouse Name: N/A     Number of Children: 0   . Years of Education: 5214     Social History Main Topics   . Smoking status: Current Every Day Smoker -- 0.50 packs/day for 2 years     Types: Cigarettes   . Smokeless tobacco: Former NeurosurgeonUser     Quit date: 10/04/2013   . Alcohol Use: 0.0 oz/week     0 Not specified per week   . Drug Use: Yes     Special: Marijuana      Comment: smoked marijuana yesterday (06/26/2014)   . Sexual Activity: Not on file     Other Topics Concern   . Not on file     Social History Narrative    ** Merged History Encounter **             Objective:  Exam:  BP 101/61 mmHg  Pulse 88  Ht 6\' 3"  (1.905 m)  Wt 170 lb (77.111 kg)  BMI 21.25 kg/m2  This is a well-nourished patient in no acute distress  Patient is awake, alert and oriented x3. Speech is normal.  Pupils are equal round reacting to light. Extraocular movements intact. Face symmetrical. Tongue  midline.  Motor exam shows normal symmetrical strength. Deep tendon reflexes normal. Plantar reflexes downgoing.  Sensory exam normal. Coordination normal. Gait unsteady and ataxic. No carotid bruit. No neck stiffness.        Data :  LABS:  General Labs:    CBC:   Lab Results   Component Value Date    WBC 4.4 08/26/2014    RBC 4.80 08/26/2014    HGB 14.6 08/26/2014    HCT 43.4 08/26/2014    MCV 90.4 08/26/2014    MCH 30.3 08/26/2014    MCHC 33.5 08/26/2014    RDW 14.0  08/26/2014    PLT 204 08/26/2014    MPV 7.5 08/26/2014     BMP:    Lab Results   Component Value Date    NA 139 08/26/2014    K 4.0 08/26/2014    CL 100 08/26/2014    CO2 24 08/26/2014    BUN 10 08/26/2014    LABALBU 4.3 08/26/2014    CREATININE 0.8 08/26/2014    CREATININE 1.0 08/17/2011    CALCIUM 8.9 08/26/2014    GFRAA >60 08/26/2014    GFRAA 157 08/19/2011    LABGLOM >60 08/26/2014    GLUCOSE 94 08/26/2014     Impression :  Partial complex seizures,  Patient had a breakthrough seizure   Patient agrees that he had been noncompliant with medication at that time  Patient is on Wellbutrin which can decrease the seizure threshold and increase the risk of seizures    Plan :  Continue Keppra 1000 mg twice a day  Explained driving restrictions.  Patient states he is not driving an automobile now  Discussed at length with patient about the importance of taking medications regularly without fail  I would recommend that he discuss with his primary physician about changing from Wellbutrin to a different medication for his psychiatric problems because of the increased risk of seizures with Wellbutrin      Please note a portion of  this chart was generated using dragon dictation software. Although every effort was made to ensure the accuracy of this automated transcription, some errors in transcription may have occurred.

## 2014-10-25 DIAGNOSIS — G40409 Other generalized epilepsy and epileptic syndromes, not intractable, without status epilepticus: Secondary | ICD-10-CM

## 2014-10-25 NOTE — ED Notes (Signed)
Bed: B-34  Expected date:   Expected time:   Means of arrival:   Comments:  seizure

## 2014-10-25 NOTE — Progress Notes (Signed)
Pt had a seizure. Family at bedside. Pt then had to be suction. Iv started physician notified. Iv keppra ordered. Placed on NRB oxygen was 88% room air. Iv being started.

## 2014-10-25 NOTE — ED Provider Notes (Signed)
[facility] EMERGENCY DEPT  eMERGENCY dEPARTMENT eNCOUnter        Pt Name: Roy Weaver  MRN: [id number]  Birthdate 1985/12/08  Date of evaluation: 10/25/2014  Provider: Benita Stabile, DO  PCP: Baldwin Jamaica, MD  ED Attending: Benita Stabile, DO    CHIEF COMPLAINT       Chief Complaint   Patient presents with   ??? Seizures       HISTORY OF PRESENT ILLNESS   (Location/Symptom, Timing/Onset, Context/Setting, Quality, Duration, Modifying Factors, Severity)  Note limiting factors.     Roy Weaver is a 29 y.o. male with a history of seizures who had a seizure.  The patient is reportedly on Keppra, and there is questionable noncompliance.  It is reported that he has had 2 seizures today.  EMS states that they have made runs on this patient before.  He was found without Keppra bottle spilled out on the floor, and the patient appeared postictal, as they have seen him in the past.  The patient is currently coming out of it, and appears his normal mental status now.  The patient has no complaints.  He denies any fevers, trauma, headache, cough, rhinorrhea or any other acute complaints.  He states that he is compliant with his medications.    Nursing Notes were all reviewed and agreed with or any disagreements were addressed  in the HPI.    REVIEW OF SYSTEMS    (2-9 systems for level 4, 10 or more for level 5)     Positives and pertinent negatives as per HPi. All other systems were reviewed and are negative.       PAST MEDICAL HISTORY     Past Medical History   Diagnosis Date   ??? Seizures (HCC)    ??? Migraine    ??? Hypertension    ??? Pneumonia    ??? Psychiatric problem          SURGICAL HISTORY     History reviewed. No pertinent past surgical history.      CURRENT MEDICATIONS       Previous Medications    BUPROPION (WELLBUTRIN) 75 MG TABLET    Take 1 tablet by mouth 2 times daily    LEVETIRACETAM (KEPPRA) 1000 MG TABLET    Take 1 tablet by mouth 2 times daily    VARENICLINE (CHANTIX CONTINUING MONTH PAK) 1 MG TABLET    Take  1 tablet by mouth 2 times daily         ALLERGIES     Review of patient's allergies indicates no known allergies.    FAMILY HISTORY       Family History   Problem Relation Age of Onset   ??? Diabetes Father           SOCIAL HISTORY       History     Social History   ??? Marital Status: Single     Spouse Name: N/A     Number of Children: 0   ??? Years of Education: 92     Social History Main Topics   ??? Smoking status: Former Smoker -- 0.50 packs/day for 2 years     Types: Cigarettes   ??? Smokeless tobacco: Former Neurosurgeon     Quit date: 10/04/2013   ??? Alcohol Use: No   ??? Drug Use: No      Comment: smoked marijuana yesterday (06/26/2014) (stopped smoking as of 10/10/14)   ??? Sexual Activity: None  Other Topics Concern   ??? None     Social History Narrative    ** Merged History Encounter **            SCREENINGS             PHYSICAL EXAM    (up to 7 for level 4, 8 or more for level 5)   ED Triage Vitals   BP Temp Temp src Pulse Resp SpO2 Height Weight   -- -- -- -- -- -- -- --                 Constitutional:  No acute distress, Non-toxic appearance  HEENT.:  Normocephalic, PERRL, EOMI, nares are patent without epistaxis  Neck: Normal range of motion, Trachea Midline, No stridor  Lungs.:  No respiratory distress, No tachypnea, Lungs Clear to Auscultation bilaterally without wheezes, rales, or rhonchi  Cardiovascular.:  Regular rate and Rhythm without murmurs, rubs, or gallops, good peripheral perfusion  GI: Abdomen soft and non tender, no masses, no rebound, rigidity, or guarding  Genital Urinary:  Deferred  Musculoskeletal: Good range of motion. All 4 extremities present  Back: Full Range of Motion  Skin: Warm, Dry, No erythema, No rash.  Lymphatic:  No lymphadenopathy noted  Neurological: Alert, Awake and oriented x 3, No focal deficits noted appreciate, GCS 15  Psych: Affect, Judgement, mood normal     DIAGNOSTIC RESULTS   LABS:    Labs Reviewed - No data to display    All other labs were within normal range or not returned as of  this dictation.      Interpretation per the Radiologist below, if available at the time of this note:    No orders to display     No results found.      CONSULTS:  None      EMERGENCY DEPARTMENT COURSE and DIFFERENTIAL DIAGNOSIS/MDM:   Vitals:    Filed Vitals:    10/25/14 2245 10/25/14 2330 10/26/14 0000 10/26/14 0100   BP: 160/92 110/60 119/68 128/77   Pulse:  91 88 84   Temp:       TempSrc:       Resp:  23 23 19    Height:       Weight:       SpO2: 98% 92% 93%        Patient was given the following medications:  Medications   LORazepam (ATIVAN) 2 MG/ML injection (not administered)   levETIRAcetam (KEPPRA) 1,500 mg in sodium chloride 0.9 % 100 mL IVPB (0 mg Intravenous Stopped 10/25/14 2319)       The patient was awake, alert and oriented when he first arrived.  There is nothing about his history or exam to suggest we need any type of medical workup, nor any more intracranial imaging.    Early in the course, while the patient was being watched, he developed a grand mal seizure that lasted only a couple of minutes and resolved before the patient was able to receive any Ativan.  Because the seizure ceased, the patient was given Keppra 1500 mg IV.      We continued to watch the patient and he was in the ER for over 5 hours.  He has not had anymore seizures and has been awake, alert and oriented and in no distress.  At this time I feel the patient is safe for discharge.  He does have a follow-up with her neurologist, and was encouraged to follow up earlier than  his November appointment, to see if he needs any changes in his medication dosages, additional medications, etc.    Critical care time was less than 30 minutes.    FINAL IMPRESSION      1. Seizure grand mal (HCC)          DISPOSITION/PLAN   DISPOSITION Decision to Discharge    PATIENT REFERRED TO:  Your Neurologist    Schedule an appointment as soon as possible for a visit        DISCHARGE MEDICATIONS:  New Prescriptions    No medications on file       DISCONTINUED  MEDICATIONS:  Discontinued Medications    No medications on file              (Please note that portions of this note were completed with a voice recognition program.  Efforts were made to edit the dictations but occasionally words are mis-transcribed.)    Benita Stabile, DO (electronically signed)          Benita Stabile, DO  10/26/14 4782

## 2014-10-26 ENCOUNTER — Inpatient Hospital Stay: Admit: 2014-10-26 | Discharge: 2014-10-26 | Payer: PRIVATE HEALTH INSURANCE | Attending: Emergency Medicine

## 2014-10-26 MED ORDER — LORAZEPAM 2 MG/ML IJ SOLN
2 MG/ML | INTRAMUSCULAR | Status: DC
Start: 2014-10-26 — End: 2014-10-26

## 2014-10-26 MED ORDER — SODIUM CHLORIDE 0.9 % IV SOLN
0.9 % | Freq: Once | INTRAVENOUS | Status: AC
Start: 2014-10-26 — End: 2014-10-25
  Administered 2014-10-26: 03:00:00 1500 mg via INTRAVENOUS

## 2014-10-26 MED FILL — LORAZEPAM 2 MG/ML IJ SOLN: 2 MG/ML | INTRAMUSCULAR | Qty: 1

## 2014-10-26 MED FILL — LEVETIRACETAM 500 MG/5ML IV SOLN: 500 MG/5ML | INTRAVENOUS | Qty: 15

## 2014-10-26 NOTE — ED Notes (Signed)
Discharge and education instructions reviewed. Patient verbalized understanding, teach-back successful. Patient denied questions at this time. No acute distress noted. Patient instructed to follow-up as noted - return to emergency department if symptoms worsen. Patient verbalized understanding. Discharged per EDMD with discharge instructions.         Lisabeth DevoidJaimmie L Helane Briceno, RN  10/26/14 [phone]

## 2015-01-09 NOTE — Telephone Encounter (Signed)
Pt seen in ER for seizure.  CT, DPH level were ordered.   DPH was <2.5.  No Keppra level drawn

## 2015-01-22 ENCOUNTER — Encounter: Attending: Nurse Practitioner | Primary: Internal Medicine

## 2015-02-02 ENCOUNTER — Inpatient Hospital Stay
Admit: 2015-02-02 | Discharge: 2015-02-02 | Disposition: A | Discharge status: Home / Self Care | Attending: Emergency Medicine

## 2015-02-02 DIAGNOSIS — Z79899 Other long term (current) drug therapy: Secondary | ICD-10-CM

## 2015-02-02 DIAGNOSIS — G40909 Epilepsy, unspecified, not intractable, without status epilepticus: Secondary | ICD-10-CM

## 2015-02-02 LAB — CBC WITH AUTO DIFFERENTIAL
Basophils %: 0.4 %
Basophils Absolute: 0 10*3/uL (ref 0.0–0.2)
Eosinophils %: 3.1 %
Eosinophils Absolute: 0.2 10*3/uL (ref 0.0–0.6)
Hematocrit: 38.1 % — ABNORMAL LOW (ref 40.5–52.5)
Hemoglobin: 13.3 g/dL — ABNORMAL LOW (ref 13.5–17.5)
Lymphocytes %: 24.2 %
Lymphocytes Absolute: 1.3 10*3/uL (ref 1.0–5.1)
MCH: 31.2 pg (ref 26.0–34.0)
MCHC: 35 g/dL (ref 31.0–36.0)
MCV: 89.3 fL (ref 80.0–100.0)
MPV: 7.5 fL (ref 5.0–10.5)
Monocytes %: 9.1 %
Monocytes Absolute: 0.5 10*3/uL (ref 0.0–1.3)
Neutrophils %: 63.2 %
Neutrophils Absolute: 3.4 10*3/uL (ref 1.7–7.7)
Platelets: 200 10*3/uL (ref 135–450)
RBC: 4.27 M/uL (ref 4.20–5.90)
RDW: 13.1 % (ref 12.4–15.4)
WBC: 5.4 10*3/uL (ref 4.0–11.0)

## 2015-02-02 LAB — BASIC METABOLIC PANEL
Anion Gap: 13 (ref 3–16)
BUN: 9 mg/dL (ref 7–20)
CO2: 27 mmol/L (ref 21–32)
Calcium: 8.9 mg/dL (ref 8.3–10.6)
Chloride: 100 mmol/L (ref 99–110)
Creatinine: 0.7 mg/dL — ABNORMAL LOW (ref 0.9–1.3)
GFR African American: >60 (ref >60)
GFR Non-African American: >60 (ref >60)
Glucose: 102 mg/dL — ABNORMAL HIGH (ref 70–99)
Potassium: 3.7 mmol/L (ref 3.5–5.1)
Sodium: 140 mmol/L (ref 136–145)

## 2015-02-02 LAB — LEVETIRACETAM LEVEL: Levetiracetam Lvl: <2 ug/mL — ABNORMAL LOW (ref 6.0–46.0)

## 2015-02-02 MED ORDER — LEVETIRACETAM IN NACL 500 MG/100ML IV SOLN
500 MG/100ML | Freq: Once | INTRAVENOUS | Status: AC
Start: 2015-02-02 — End: 2015-02-02
  Administered 2015-02-02: 21:00:00 500 mg via INTRAVENOUS

## 2015-02-02 MED FILL — LEVETIRACETAM IN NACL 500 MG/100ML IV SOLN: 500 MG/100ML | INTRAVENOUS | Qty: 100

## 2015-02-02 NOTE — ED Provider Notes (Signed)
[facility] EMERGENCY DEPT  [address] [place] [address]  Dept: [phone]  Loc: [phone]    eMERGENCY dEPARTMENT eNCOUnter        CHIEF COMPLAINT  Chief Complaint   Patient presents with   . Seizures       HPI    Roy Weaver is a 29 y.o. male who presents with a seizure that occurred prior to arrival.  The quality of the seizure was generalized shaking.  The duration of the seizure was "a few minutes".  The postictal period was brief.  The patient denies any associated pain.  The context is that the patient was found "flopping around on the floor" by his mother.  He states that she told him that he was confused for a short time but this resolved prior to arrival in the ED.  Patient admits to missing "1 or 2 doses" of his Keppra over the past few days. He is currently prescribed 1000 mg twice daily.    REVIEW OF SYSTEMS    Neuro: see HPI  Cardiac: No chest pain or palpitations  Respiratory:No shortness of breath or new cough  General: No fevers or chills  GU: No dysuria or hematuria  GI: No vomiting or diarrhea  Musculoskeletal: Patient denies any shoulder pain or limited range of motion  See HPI for further details.  All other systems reviewed and are negative.    PAST MEDICAL OR SURGICAL HISTORY    Past Medical History   Diagnosis Date   . Hypertension    . Migraine    . Pneumonia    . Psychiatric problem    . Seizures (HCC)      No past surgical history on file.    CURRENT MEDICATIONS  (may include discharge medications prescribed in the ED)  Current Outpatient Rx   Medication Sig Dispense Refill   . levETIRAcetam (KEPPRA) 1000 MG tablet Take 1 tablet by mouth 2 times daily 60 tablet 6   . varenicline (CHANTIX CONTINUING MONTH PAK) 1 MG tablet Take 1 tablet by mouth 2 times daily 60 tablet 3   . buPROPion (WELLBUTRIN) 75 MG tablet Take 1 tablet by mouth 2 times daily 60 tablet 3       ALLERGIES    No Known Allergies    FAMILY OR SOCIAL HISTORY    Family History   Problem  Relation Age of Onset   . Diabetes Father      Social History     Social History   . Marital status: Single     Spouse name: N/A   . Number of children: 0   . Years of education: 66     Social History Main Topics   . Smoking status: Former Smoker     Packs/day: 0.50     Years: 2.00     Types: Cigarettes   . Smokeless tobacco: Former Neurosurgeon     Quit date: 10/04/2013   . Alcohol use No   . Drug use: No      Comment: smoked marijuana yesterday (06/26/2014) (stopped smoking as of 10/10/14)   . Sexual activity: Not on file     Other Topics Concern   . Not on file     Social History Narrative    ** Merged History Encounter **            PHYSICAL EXAM    VITAL SIGNS:   Visit Vitals   . BP 140/87   .  Pulse 77   . Temp 98 F (36.7 C) (Oral)   . Resp 16   . Wt 173 lb 4.5 oz (78.6 kg)   . SpO2 96%   . BMI 21.09 kg/m2     Constitutional:  Well developed, well nourished, no acute distress  Eyes:  Pupils equally round and reactive to light, sclera nonicteric  HENT:  External ears normal, nose normal, oropharynx moist  NECK: Normal range of motion, no tenderness  Respiratory:  No respiratory distress, normal breath sounds, no wheezing   Cardiovascular:  regular rate, normal rhythm, no murmurs   GI:  Soft, nondistended, normal bowel sounds, nontender  Musculoskeletal:  No edema, no acute deformities   Integument:  Skin is warm and dry, no obvious rash   Neurologic:  Awake, alert and oriented, no slurred speech, normal finger to nose test bilaterally, cranial nerves II-12 grossly intact, 5/5 strength in all 4 extremities   Vascular: Radial and DP pulses 2+ equal bilaterally    ED COURSE & MEDICAL DECISION MAKING    Pertinent Labs & Imaging studies reviewed and interpreted. (See chart for details)  See chart for details of medications given during the ED stay.    Differential diagnosis: Status Epilepticus, Breakthrough Seizure, Intracranial Bleed, Brain Tumor, Hypoglycemia, neck fracture or other orthopedic fractures, posterior shoulder  dislocation, tongue laceration, other.    Patient is afebrile and nontoxic with unremarkable vital signs. Neuro exam unremarkable, patient has been awake and alert throughout his stay in the ED.  Keppra level is subtherapeutic. Labs are otherwise unremarkable.  I discussed with the patient importance of taking his Keppra.  He admits that he may have missed more than a few doses.  He states that he does have enough Keppra at home, and states that he will begin taking it as prescribed.  He has an appointment with his neurologist on the 22nd of this month.  I recommended that he call him to see if he can schedule an earlier appointment.    The patient was instructed to follow up as an outpatient. The patient was instructed to return to the ED immediately for any new or worsening symptoms.  The patient verbalized understanding.    The patient was also seen and evaluated by the ED attending, Dr. Jarold Motto. Please see the attending note for further details.    FINAL IMPRESSION    1. Seizure secondary to subtherapeutic anticonvulsant medication [facility])        PLAN  Discharge with close outpatient follow-up    (Please note that this note was completed with a voice recognition program.  Every attempt was made to edit the dictations, but inevitably there remain words that are mis-transcribed.)          Bonne Dolores, PA  02/02/15 1901

## 2015-02-02 NOTE — Discharge Instructions (Signed)
TAKE YOUR KEPPRA AS PRESCRIBED AND FOLLOW UP WITH YOUR NEUROLOGIST.    Seizure: Care Instructions  Your Care Instructions     Seizures are caused by abnormal patterns of electrical signals in the brain. They are different for each person.  Seizures can affect movement, speech, vision, or awareness. Some people have only slight shaking of a hand and do not pass out. Other people may pass out and have violent shaking of the whole body. Some people appear to stare into space. They are awake, but they can't respond normally. Later, they may not remember what happened.  You may need tests to identify the type and cause of the seizures.  A seizure may occur only once, or you may have them more than one time. Taking medicines as directed and following up with your doctor may help keep you from having more seizures.  The doctor has checked you carefully, but problems can develop later. If you notice any problems or new symptoms, get medical treatment right away.  Follow-up care is a key part of your treatment and safety. Be sure to make and go to all appointments, and call your doctor if you are having problems. It's also a good idea to know your test results and keep a list of the medicines you take.  How can you care for yourself at home?   Be safe with medicines. Take your medicines exactly as prescribed. Call your doctor if you think you are having a problem with your medicine.   Do not do any activity that could be dangerous to you or others until your doctor says it is safe to do so. For example, do not drive a car, operate machinery, swim, or climb ladders.   Be sure that anyone treating you for any health problem knows that you have had a seizure and what medicines you are taking for it.   Identify and avoid things that may make you more likely to have a seizure. These may include lack of sleep, alcohol or drug use, stress, or not eating.   Make sure you go to your follow-up appointment.  When should you call  for help?  Call 911 anytime you think you may need emergency care. For example, call if:   You have another seizure.   You have more than one seizure in 24 hours.   You have new symptoms, such as trouble walking, speaking, or thinking clearly.  Call your doctor now or seek immediate medical care if:   You are not acting normally.  Watch closely for changes in your health, and be sure to contact your doctor if you have any problems.  Where can you learn more?  Go to [email or web address] and sign in to your MyChart account. Enter [phone] in the Search Health Information box to learn more about "Seizure: Care Instructions."    If you do not have an account, please click on the "Sign Up Now" link.   2006-2016 [employer]. Care instructions adapted under license by [facility]. This care instruction is for use with your licensed healthcare professional. If you have questions about a medical condition or this instruction, always ask your healthcare professional. [employer] disclaims any warranty or liability for your use of this information.  Content Version: 10.9.538570; Current as of: July 12, 2014

## 2015-02-02 NOTE — ED Provider Notes (Signed)
I independently performed a history and physical on Roy Weaver.   All diagnostic, treatment, and disposition decisions were made by myself in conjunction with the advanced practice provider.     Briefly, this is a 29 y.o. male here for seizure that occurred at home.  Mother states that the patient may have missed a few doses of his normal anti-seizure medications.    On exam, patient has a normal neurologic examination, and has remained stable throughout his stay here in the emergency department.  Patient's Keppra level was subtherapeutic and he was administered more medications here.    For further details of Roy Weaver's emergency department encounter, please see documentation by advanced practice provider Barbaraann Share .       San Jetty, MD  02/10/15 1606

## 2015-02-02 NOTE — ED Notes (Signed)
Seizure precaution started      Tyrone Apple, RN  02/02/15 1650

## 2015-02-02 NOTE — ED Provider Notes (Signed)
[facility] EMERGENCY DEPT  eMERGENCY dEPARTMENT eNCOUnter        Pt Name: Roy Weaver  MRN: [id number]  Birthdate 1986/01/20  Date of evaluation: 02/02/2015  Provider: Lelon Mast, DO  PCP: Baldwin Jamaica, MD      CHIEF COMPLAINT       Chief Complaint   Patient presents with   ??? Seizures     pt seen earlier today and sdischarge for seizure, upon dishcarge had a seizure in the car, pt mother dropped him off and left        HISTORY OF PRESENT ILLNESS   (Location/Symptom, Timing/Onset, Context/Setting, Quality, Duration, Modifying Factors, Severity)  Note limiting factors.     Roy Weaver is a 29 y.o. male  With a history of epilepsy for 2 years, migraines, hypertension, history of also medical noncompliance, history of also reported illicit drug use who presented today because of a seizure.  He was just seen here recently in the emergency room this evening for the same problem.  I spoke to Dr. Jarold Motto who is my partner who evaluated the patient that time.  He had a seizure today, his Keppra level was undetectable clearly is not compliant with his medication, he received an IV bolus and He was monitored and eventually discharge other department.  It was described as a typical seizure.  Apparently his mother picked him up, he got to the car and as a relieving the hospital he had another seizure in the car and uncomplicated nontraumatic fashion.  Patient says he  feels fine.  He says he gets seizures every few months, he took his Keppra early this morning and really has not has Been a long time.  Dr. Glean Hess as his neurologist.  He smokes with denies any alcohol or drugs.  He denies any typical findings or any associations that triggered the seizures including sleep deprivation, medications or foods.  He has no idea of the happening.  Again he did not bite his tongue or lose continence.  Otherwise he reports even while usual state of health without any recent sickness, illness or hospitalization.  This time.   Again the seizure happened in a car, was nontraumatic in nature.    Nursing Notes were all reviewed and agreed with or any disagreements were addressed  in the HPI.    REVIEW OF SYSTEMS    (2-9 systems for level 4, 10 or more for level 5)     Review of Systems    Positives and Pertinent negatives as per HPI.  Except as noted above in the ROS, all other systems were reviewed and negative.       PAST MEDICAL HISTORY     Past Medical History   Diagnosis Date   ??? Hypertension    ??? Migraine    ??? Pneumonia    ??? Psychiatric problem    ??? Seizures (HCC)          SURGICAL HISTORY     History reviewed. No pertinent past surgical history.      CURRENT MEDICATIONS       Discharge Medication List as of 02/03/2015  1:46 AM      CONTINUE these medications which have NOT CHANGED    Details   levETIRAcetam (KEPPRA) 1000 MG tablet Take 1 tablet by mouth 2 times daily, Disp-60 tablet, R-6      varenicline (CHANTIX CONTINUING MONTH PAK) 1 MG tablet Take 1 tablet by mouth 2 times daily, Disp-60 tablet, R-3  buPROPion (WELLBUTRIN) 75 MG tablet Take 1 tablet by mouth 2 times daily, Disp-60 tablet, R-3             ALLERGIES     Review of patient's allergies indicates no known allergies.    FAMILY HISTORY       Family History   Problem Relation Age of Onset   ??? Diabetes Father           SOCIAL HISTORY       Social History     Social History   ??? Marital status: Single     Spouse name: N/A   ??? Number of children: 0   ??? Years of education: 42     Social History Main Topics   ??? Smoking status: Former Smoker     Packs/day: 0.50     Years: 2.00     Types: Cigarettes   ??? Smokeless tobacco: Former Neurosurgeon     Quit date: 10/04/2013   ??? Alcohol use No   ??? Drug use: No      Comment: smoked marijuana yesterday (06/26/2014) (stopped smoking as of 10/10/14)   ??? Sexual activity: Not Asked     Other Topics Concern   ??? None     Social History Narrative    ** Merged History Encounter **            SCREENINGS             PHYSICAL EXAM    (up to 7 for level 4, 8 or  more for level 5)   ED Triage Vitals   BP Temp Temp Source Pulse Resp SpO2 Height Weight   02/02/15 2100 02/02/15 2100 02/02/15 2100 02/02/15 2100 02/02/15 2100 02/02/15 2100 02/02/15 2100 02/02/15 2100   133/88 98.8 ??F (37.1 ??C) Oral 81 16 96 % 6\' 3"  (1.905 m) 177 lb 0.5 oz (80.3 kg)       Physical Exam       General Appearance:  Alert, cooperative, no distress, non-toxic, he appears tired   Head:  Normocephalic, without obvious abnormality, atraumatic,    Eyes:  conjunctiva/corneas clear, EOM's intact.  Sclera anicteric.   ENT: Mucous membranes moist.  No stridor or oropharyngeal trauma noted.  No pain to the head or neck area, no cervical spine pain, step-off or deformity   Neck: Supple, symmetrical, trachea midline, no adenopathy.  No rigidity   Lungs:   No Respiratory Distress.  Clear bilaterally without wheezing/rales/rhonchi   Chest Wall:  symmetrical   Heart:  S1-S2 regular rate and rhythm without any murmur, rub or gallop   Abdomen:   Soft nontender   Extremities:  Full range of motion.  No pain noted over any major bone or joint, no edema or cyanosis or clubbing   Pulses: Equal and symmetric   Skin:  No rashes or lesions to exposed skin however I do see what I think is healing track marks in both arms consistent with IV drug abuse.  Cap refill normal   Neurologic: Alert and oriented X 3.   CN 2-12 intact, strength and sensation symmetrical, reflexes intact bilaterally, no focal deficits, cerebellar function intact, pupils equal round and reactive to light       DIAGNOSTIC RESULTS   LABS:    Labs Reviewed   CBC WITH AUTO DIFFERENTIAL - Abnormal; Notable for the following:     Hemoglobin 13.4 (*)     Hematocrit 38.7 (*)     All other components within normal  limits   BASIC METABOLIC PANEL - Abnormal; Notable for the following:     Chloride 98 (*)     CREATININE 0.7 (*)     All other components within normal limits   MAGNESIUM       All other labs were within normal range or not returned as of this  dictation.    EKG: All EKG's are interpreted by the Emergency Department Physician who either signs or Co-signs this chart in the absence of a cardiologist.        RADIOLOGY:   Non-plain film images such as CT, Ultrasound and MRI are read by the radiologist. Plain radiographic images are visualized and preliminarily interpreted by the  ED Provider with the below findings:        Interpretation per the Radiologist below, if available at the time of this note:    No orders to display         PROCEDURES   Unless otherwise noted below, none     Procedures    CRITICAL CARE TIME   N/A    CONSULTS:  None    EMERGENCY DEPARTMENT COURSE and DIFFERENTIAL DIAGNOSIS/MDM:   Vitals:    Vitals:    02/03/15 0144 02/03/15 0147 02/03/15 0148 02/03/15 0155   BP: 146/77 137/87 137/87    Pulse: 67 70 79    Resp: Temp:  98.9 ??F (37.2 ??C)     TempSrc:  Oral     SpO2:  92% (!) 85% (!) 88%   Weight:       Height:           Patient was given the following medications:  Medications   levetiracetam (KEPPRA) 500 mg/100 mL IVPB (0 mg Intravenous Stopped 02/02/15 2143)       This is a 29 year old male presents again here today with secondary seizure.differential would include a toxic or metabolic  encephalopathy, fluid or electrolytes disturbance or other acute process however this time since was just seen by my partner diagnosis seizure activity, believe this is consistent with that of recurrence of seizures with secondary medical noncompliance. Therefore did have an IV established.  An additional 500 Was given IV.  Seizure precautions initiated.  Given fluids.  Labs including electrolytes and CBC and magnesium are unremarkable.  This time The patient and the hospital for 3-4 hours,'s father finally came by to see him.  He doesn't really admit the patient does not take his medications, and this times before discharged home patient became somewhat  Somnolent and less responsive.  His pulse ox was documented an nursing staff as being  low in the 80s and 90s however we rechecked it, and is been more appropriate on room air and to the mid 90s.  He's been in the mid 90s to upper 90s entire time.  He came a little more awake and alert once his father got here.   Father does state the patient has a history of drug abuse.  At this time he has been closely observed in the emergency Department, never been out of her sight where he could've it illicitly abuse any drugs or snuck anything into his room. Therefore do not think this represents a possibility of a secondary opiate toxidrome or the temporal drug abuse toxidrome, highest I had a further seizures is been here, he's been up walking around, inability him again once more time, he is awake alert, patient does not want stay in the hospital and  longer, father is comfortable at this time taking him home, is discharged home with a again a prescription for the Keppra which alerted given, follow-up instructions are given to him per primary care and neurology, and of course return immediately with other concerns he may have, he developed both agree, the patient therefore is discharged home in stable and improved condition.    The patient tolerated their visit well.   The patient and / or the family were informed of the results of any tests, a time was given to answer questions.    FINAL IMPRESSION      1. Recurrent seizures [facility])          DISPOSITION/PLAN   DISPOSITION Decision to Discharge    PATIENT REFERRED TO:  [facility] Emergency Dept  [address] [place] [address]  [phone]  In 1 day  As needed, If symptoms worsen.  Please call your neurologist on Monday for follow-up      DISCHARGE MEDICATIONS:  Discharge Medication List as of 02/03/2015  1:46 AM          DISCONTINUED MEDICATIONS:  Discharge Medication List as of 02/03/2015  1:46 AM                 (Please note that portions of this note were completed with a voice recognition program.  Efforts were made to edit the dictations but  occasionally words are mis-transcribed.)    Lelon Mast, DO (electronically signed)           Dow Adolph, DO  02/07/15 [phone]

## 2015-02-02 NOTE — ED Triage Notes (Signed)
Pt had a probable seizure. Pt mother "heard" him flopping around in room. Upon squad arrival to scene- pt was slightly confused. BS 95. Upon arrival to ED pt A&Ox4. Took Keppra this morning

## 2015-02-02 NOTE — ED Notes (Signed)
Bed: B-06  Expected date:   Expected time:   Means of arrival: [facility]  Comments:  47M Seizure     Tyrone Apple, RN  02/02/15 1642

## 2015-02-02 NOTE — ED Notes (Signed)
Pt mother updated on pt status at this time via telephone      Gabriel Cirri, RN  02/02/15 2345

## 2015-02-02 NOTE — ED Notes (Signed)
Dr Jerald Kief present at bedside to evaluate pt      Gabriel Cirri, RN  02/02/15 2114

## 2015-02-03 ENCOUNTER — Inpatient Hospital Stay
Admit: 2015-02-03 | Discharge: 2015-02-03 | Disposition: A | Discharge status: Home / Self Care | Attending: Emergency Medicine

## 2015-02-03 LAB — CBC WITH AUTO DIFFERENTIAL
Basophils %: 0.3 %
Basophils Absolute: 0 10*3/uL (ref 0.0–0.2)
Eosinophils %: 1.3 %
Eosinophils Absolute: 0.1 10*3/uL (ref 0.0–0.6)
Hematocrit: 38.7 % — ABNORMAL LOW (ref 40.5–52.5)
Hemoglobin: 13.4 g/dL — ABNORMAL LOW (ref 13.5–17.5)
Lymphocytes %: 16.5 %
Lymphocytes Absolute: 1 10*3/uL (ref 1.0–5.1)
MCH: 31.2 pg (ref 26.0–34.0)
MCHC: 34.6 g/dL (ref 31.0–36.0)
MCV: 90 fL (ref 80.0–100.0)
MPV: 7.1 fL (ref 5.0–10.5)
Monocytes %: 6.6 %
Monocytes Absolute: 0.4 10*3/uL (ref 0.0–1.3)
Neutrophils %: 75.3 %
Neutrophils Absolute: 4.5 10*3/uL (ref 1.7–7.7)
Platelets: 201 10*3/uL (ref 135–450)
RBC: 4.3 M/uL (ref 4.20–5.90)
RDW: 12.8 % (ref 12.4–15.4)
WBC: 6 10*3/uL (ref 4.0–11.0)

## 2015-02-03 LAB — BASIC METABOLIC PANEL
Anion Gap: 13 (ref 3–16)
BUN: 7 mg/dL (ref 7–20)
CO2: 27 mmol/L (ref 21–32)
Calcium: 8.9 mg/dL (ref 8.3–10.6)
Chloride: 98 mmol/L — ABNORMAL LOW (ref 99–110)
Creatinine: 0.7 mg/dL — ABNORMAL LOW (ref 0.9–1.3)
GFR African American: >60 (ref >60)
GFR Non-African American: >60 (ref >60)
Glucose: 96 mg/dL (ref 70–99)
Potassium: 4.1 mmol/L (ref 3.5–5.1)
Sodium: 138 mmol/L (ref 136–145)

## 2015-02-03 LAB — MAGNESIUM: Magnesium: 1.9 mg/dL (ref 1.80–2.40)

## 2015-02-03 MED ORDER — LEVETIRACETAM IN NACL 500 MG/100ML IV SOLN
500 MG/100ML | Freq: Once | INTRAVENOUS | Status: AC
Start: 2015-02-03 — End: 2015-02-02
  Administered 2015-02-03: 01:00:00 500 mg via INTRAVENOUS

## 2015-02-03 MED FILL — LEVETIRACETAM IN NACL 500 MG/100ML IV SOLN: 500 MG/100ML | INTRAVENOUS | Qty: 100

## 2015-02-03 NOTE — ED Notes (Addendum)
Gabriel Cirri, RN  02/03/15 [phone]

## 2015-02-03 NOTE — ED Notes (Signed)
Pt provided telephone at this time to call ride home      Gabriel Cirri, [place]  02/03/15 0023

## 2015-02-03 NOTE — ED Notes (Addendum)
Pt groggy and unable to wake up easily, SpO2 at 88-89 on RA with stimulation, family present at bedside, no distress noted, Dr Jerald Kief notified        Gabriel Cirri, RN  02/03/15 [phone]

## 2015-02-03 NOTE — ED Notes (Signed)
Dr Jerald Kief present at bedside      Gabriel Cirri, RN  02/03/15 0200

## 2015-02-03 NOTE — ED Notes (Addendum)
Gabriel Cirri, RN  02/03/15 [phone]

## 2015-03-16 DIAGNOSIS — S0990XA Unspecified injury of head, initial encounter: Secondary | ICD-10-CM

## 2015-03-16 NOTE — ED Triage Notes (Signed)
Pt states he tripped and hit his head on the night stand. Pt hs a laceration above right eyebrow.  PT denies loc and states he did not have a seizure.

## 2015-03-16 NOTE — ED Provider Notes (Signed)
[facility]  eMERGENCY dEPARTMENT eNCOUnter      Pt Name: Roy Weaver  MRN: [id number]  Birthdate 28-Dec-1985  Date of evaluation: 03/16/2015  Provider: Gala Lewandowskyelinda Sue Devona Holmes, NP     Independent APP visit    CHIEF COMPLAINT       Chief Complaint   Patient presents with   ??? Head Laceration         HISTORY OF PRESENT ILLNESS  (Location/Symptom, Timing/Onset, Context/Setting, Quality, Duration, Modifying Factors, Severity.)   Roy Greeningrnest J Bowe is a 29 y.o. male who presents to the emergency department with a laceration to the forehead just above the right eyebrow. Patient states he slipped in the bathroom hitting his head on the edge of the counter. No loss of consciousness negative neck pain. Reports a tetanus shot 2 months ago. Not on any anti-regulation. Does have a history of seizures. He reports being compliant with his medications.  Patient states the reason that he fell was he slipped on the floor.      Nursing Notes were reviewed and I agree.    REVIEW OF SYSTEMS    (2-9 systems for level 4, 10 or more for level 5)     Review of Systems   Constitutional: Negative.    HENT: Negative.    Respiratory: Negative.    Cardiovascular: Negative.    Gastrointestinal: Negative.    Genitourinary: Negative.    Musculoskeletal: Negative.    Skin:        Data in history of present illness   Allergic/Immunologic: Negative for immunocompromised state.   Neurological: Negative.    Hematological: Negative.        Except as noted above the remainder of the review of systems was reviewed and negative.       PAST MEDICAL HISTORY         Diagnosis Date   ??? Hypertension    ??? Migraine    ??? Pneumonia    ??? Psychiatric problem    ??? Seizures (HCC)        SURGICAL HISTORY     History reviewed. No pertinent past surgical history.    CURRENT MEDICATIONS       Previous Medications    LEVETIRACETAM (KEPPRA) 1000 MG TABLET    Take 1 tablet by mouth 2 times daily       ALLERGIES     Review of patient's allergies indicates no known  allergies.    FAMILY HISTORY           Problem Relation Age of Onset   ??? Diabetes Father      Family Status   Relation Status   ??? Father Alive   ??? Mother Alive        SOCIAL HISTORY      reports that he has been smoking Cigarettes.  He has a 4.00 pack-year smoking history. He quit smokeless tobacco use about 17 months ago. He reports that he uses illicit drugs, including Marijuana. He reports that he does not drink alcohol.    PHYSICAL EXAM    (up to 7 for level 4, 8 or more for level 5)   ED Triage Vitals   BP Temp Temp Source Pulse Resp SpO2 Height Weight   03/16/15 2225 03/16/15 2225 03/16/15 2225 03/16/15 2225 03/16/15 2225 03/16/15 2225 -- 03/16/15 2225   150/89 96.3 ??F (35.7 ??C) Oral 93 14 98 %  178 lb 2.1 oz (80.8 kg)       Physical Exam  Constitutional: He is oriented to person, place, and time. Vital signs are normal. He appears well-developed and well-nourished. He is cooperative.  Non-toxic appearance. He does not have a sickly appearance. He does not appear ill. No distress.   HENT:   Head: Normocephalic.   Nose: Nose normal.   Mouth/Throat: Oropharynx is clear and moist. No oropharyngeal exudate.   2 cm slightly gaping well approximated superficial partial-thickness laceration to the right side of the forehead just above the right eyebrow. No evidence of foreign body. Bleeding controlled with compression. Frontal maxillary mandibular facial structures symmetrical. EOMs intact.  No evidence of tongue trauma.   Eyes: Pupils are equal, round, and reactive to light. No scleral icterus.   Neck: Normal range of motion. Neck supple.   Cardiovascular: Normal rate, regular rhythm and intact distal pulses.  Exam reveals no gallop and no friction rub.    No murmur heard.  Neurological: He is alert and oriented to person, place, and time. He has normal strength. No cranial nerve deficit or sensory deficit. He displays a negative Romberg sign. Coordination and gait normal. GCS eye subscore is 4. GCS verbal subscore  is 5. GCS motor subscore is 6.   Speech clear and articulate   Skin: Skin is warm and dry. He is not diaphoretic.   Laceration right side of forehead just above the right eyebrow   Nursing note and vitals reviewed.        DIAGNOSTIC RESULTS     RADIOLOGY:   Non-plain film images such as CT, Ultrasound and MRI are read by the radiologist. Plain radiographic images are visualized and preliminarily interpreted by Gala Lewandowsky, NP with the below findings:        Interpretation per the Radiologist below, if available at the time of this note:    No orders to display       LABS:  Labs Reviewed - No data to display    All other labs were within normal range or not returned as of this dictation.    EMERGENCY DEPARTMENT COURSE and DIFFERENTIAL DIAGNOSIS/MDM:   Vitals:    Vitals:    03/16/15 2225   BP: 150/89   Pulse: 93   Resp: 14   Temp: 96.3 ??F (35.7 ??C)   TempSrc: Oral   SpO2: 98%   Weight: 178 lb 2.1 oz (80.8 kg)     Medications   lidocaine-EPINEPHrine 1 percent-1:100000 injection 20 mL (20 mLs Intradermal Given 03/16/15 2249)       MDM    Patient seen and evaluated per myself. He is nontoxic in presentation hemodynamically stable. Mechanical standing height fall at home in the bathroom resulting in a right forehead laceration when he struck his head on the edge of the counter.  Isolated injury. Cervical spine cleared per Nexus criteria.  Per ATLS protocol primary secondary survey performed. GCS 15. Moves all extremities. Negative for paresthesia paralysis. Isolated right forehead laceration as described. Negative neck pain mid or low back pain. Chest nontender abdomen nontender.  I do not see any indication for diagnostic studies. Tetanus status is up-to-date. No indication that this is a seizure related event.  Primary wound closure achieved. Wound care instructions given to patient. Sutures can be removed in 5 days. Plan of care discussed with the patient he was in agreement discharged home in good  condition.    PROCEDURES:  Lac Repair  Date/Time: 03/16/2015 10:47 PM  Performed by: Edman Circle  Authorized by: San Jetty  Consent:     Consent obtained:  Verbal    Consent given by:  Patient    Risks discussed:  Infection, need for additional repair, nerve damage, pain, poor cosmetic result, poor wound healing, retained foreign body, tendon damage and vascular damage  Anesthesia (see MAR for exact dosages):     Anesthesia method:  Local infiltration    Local anesthetic:  Lidocaine 1% WITH epi  Laceration details:     Location:  Face    Face location:  Forehead (just slightly above right eyebrow)    Length (cm):  2    Depth (mm):  3  Repair type:     Repair type:  Simple  Pre-procedure details:     Preparation:  Patient was prepped and draped in usual sterile fashion  Exploration:     Hemostasis achieved with:  Direct pressure    Wound exploration: wound explored through full range of motion and entire depth of wound probed and visualized      Wound extent: no areolar tissue violation noted, no fascia violation noted, no foreign bodies/material noted, no nerve damage noted, no tendon damage noted, no underlying fracture noted and no vascular damage noted      Contaminated: no    Treatment:     Area cleansed with:  Hibiclens and saline    Amount of cleaning:  Standard    Irrigation solution:  Sterile saline    Irrigation volume:  200    Irrigation method:  Syringe  Skin repair:     Repair method:  Sutures    Suture size:  5-0    Suture material:  Prolene    Suture technique:  Simple interrupted    Number of sutures:  5  Approximation:     Approximation:  Close    Vermilion border: well-aligned    Post-procedure details:     Dressing:  Open (no dressing)    Patient tolerance of procedure:  Tolerated well, no immediate complications          FINAL IMPRESSION      1. Head injury, initial encounter    2. Facial laceration, initial encounter          DISPOSITION/PLAN   DISPOSITION     PATIENT REFERRED  TO:  Baldwin Jamaica, MD  [address] [place] [address]  [phone]      For wound re-check, For suture removal: 5 days      DISCHARGE MEDICATIONS:  New Prescriptions    No medications on file       (Please note that portions of this note were completed with a voice recognition program.  Efforts were made to edit the dictations but occasionally words are mis-transcribed.)    Myrka Sylva Dory Peru, NP     Edman Circle, NP  03/16/15 [phone]

## 2015-03-16 NOTE — ED Notes (Signed)
Per patient "I wasn't paying attention so I feel and hit my head".      Delinda, NP at bedside.     Grandville Siloseginald Jhada Risk, RN  03/16/15 2249

## 2015-03-17 ENCOUNTER — Inpatient Hospital Stay
Admit: 2015-03-17 | Discharge: 2015-03-17 | Disposition: A | Discharge status: Home / Self Care | Attending: Emergency Medicine

## 2015-03-17 MED ORDER — LIDOCAINE-EPINEPHRINE 1 %-1:100000 IJ SOLN
1 %-:00000 | Freq: Once | INTRAMUSCULAR | Status: AC
Start: 2015-03-17 — End: 2015-03-16
  Administered 2015-03-17: 03:00:00 20 mL via INTRADERMAL

## 2015-03-25 ENCOUNTER — Ambulatory Visit
Admit: 2015-03-25 | Discharge: 2015-03-25 | Payer: PRIVATE HEALTH INSURANCE | Attending: Internal Medicine | Primary: Internal Medicine

## 2015-03-25 DIAGNOSIS — Z4802 Encounter for removal of sutures: Secondary | ICD-10-CM

## 2015-03-25 NOTE — Progress Notes (Signed)
Subjective:      Patient ID: Roy Weaver is a 29 y.o. male.    Suture / Staple Removal   The sutures were placed 3 to 6 days ago. He tried nothing since the wound repair. The treatment provided significant relief. The temperature was taken using an oral thermometer. There has been no drainage from the wound. There is no redness present. There is no swelling present. The pain has no pain. He has no difficulty moving the affected extremity or digit.       Review of Systems   Constitutional: Negative for activity change, appetite change, chills, diaphoresis, fatigue, fever and unexpected weight change.   HENT: Negative.    Eyes: Negative for photophobia, pain, discharge, redness, itching and visual disturbance.   Respiratory: Negative.    Cardiovascular: Negative.    Gastrointestinal: Negative.    Genitourinary: Negative.    Musculoskeletal: Negative.    Skin: Negative.    Neurological: Negative.    Psychiatric/Behavioral: Negative.        Objective:   Physical Exam   Constitutional: He is oriented to person, place, and time. He appears well-developed and well-nourished.   HENT:   Head: Normocephalic and atraumatic.   Eyes: Conjunctivae and EOM are normal. Pupils are equal, round, and reactive to light.   Neck: Normal range of motion. Neck supple.   Cardiovascular: Normal rate, regular rhythm and normal heart sounds.    Pulmonary/Chest: Effort normal and breath sounds normal.   Musculoskeletal: Normal range of motion.   Neurological: He is alert and oriented to person, place, and time.   Skin: Skin is warm and dry.   Psychiatric: He has a normal mood and affect. His behavior is normal. Judgment and thought content normal.       Assessment:      Visit for suture removal  Pt. Needed sutures removed from over the right eye from laceration stitched 5 days ago. 4 sutures were removed and the wound is solid.          Plan:      Sutures removed. Follow up with Dr. Zola Button as before.

## 2015-03-25 NOTE — Assessment & Plan Note (Signed)
Pt. Needed sutures removed from over the right eye from laceration stitched 5 days ago. 4 sutures were removed and the wound is solid.

## 2015-04-14 ENCOUNTER — Ambulatory Visit
Admit: 2015-04-14 | Discharge: 2015-04-14 | Payer: PRIVATE HEALTH INSURANCE | Attending: Clinical Neurophysiology | Primary: Internal Medicine

## 2015-04-14 DIAGNOSIS — G40909 Epilepsy, unspecified, not intractable, without status epilepticus: Secondary | ICD-10-CM

## 2015-04-14 MED ORDER — LEVETIRACETAM 1000 MG PO TABS
1000 MG | ORAL_TABLET | Freq: Two times a day (BID) | ORAL | 6 refills | Status: DC
Start: 2015-04-14 — End: 2015-06-27

## 2015-04-14 NOTE — Progress Notes (Signed)
Roy GreeningErnest J Weaver   Neurology followup    Subjective:   CC/HP  History was obtained from the patient and his mother  Patient has known partial complex seizures  Patient had a seizure in September 21, 2014.  Apparently patient ran out of medication and had not been taking it for a while.  Patient now is back on medication taking Keppra 1000 mg twice a day  Patient has not had any further seizures since April 2016.  Patient is now off the Wellbutrin as well.  No side effects of medication.  No other neurological symptoms    REVIEW OF SYSTEMS    Constitutional:  [x]    Chills   []   Fatigue   []   Fevers   []   Malaise   [x]   Weight loss     []  Denies all of the above    Respiratory:   []   Cough    []   Shortness of breath         []  Denies all of the above     Cardiovascular:   []   Chest pain    []   Exertional chest pressure/discomfort           []  Palpitations    []   Syncope     [x]  Denies all of the above        Past Medical History   Diagnosis Date   . Hypertension    . Migraine    . Pneumonia    . Psychiatric problem    . Seizures (HCC)      Family History   Problem Relation Age of Onset   . Diabetes Father      Social History     Social History   . Marital status: Single     Spouse name: N/A   . Number of children: 0   . Years of education: 4714     Social History Main Topics   . Smoking status: Current Some Day Smoker     Packs/day: 2.00     Years: 2.00     Types: Cigarettes   . Smokeless tobacco: Former NeurosurgeonUser     Quit date: 10/04/2013   . Alcohol use No   . Drug use: Yes     Special: Marijuana      Comment: smokes once every 3 weeks   . Sexual activity: Not Asked     Other Topics Concern   . None     Social History Narrative    ** Merged History Encounter **             Objective:  Exam:  Visit Vitals   . BP 138/78   . Pulse 85   . Ht 6\' 3"  (1.905 m)   . Wt 176 lb (79.8 kg)   . BMI 22 kg/m2     This is a well-nourished patient in no acute distress  Patient is awake, alert and oriented x3. Speech is normal.  Pupils are equal  round reacting to light. Extraocular movements intact. Face symmetrical. Tongue midline.  Motor exam shows normal symmetrical strength. Deep tendon reflexes normal. Plantar reflexes downgoing.  Sensory exam normal. Coordination normal. Gait unsteady and ataxic. No carotid bruit. No neck stiffness.        Data :  LABS:  General Labs:    CBC:   Lab Results   Component Value Date    WBC 6.0 02/02/2015    RBC 4.30 02/02/2015    HGB 13.4 02/02/2015  HCT 38.7 02/02/2015    MCV 90.0 02/02/2015    MCH 31.2 02/02/2015    MCHC 34.6 02/02/2015    RDW 12.8 02/02/2015    PLT 201 02/02/2015    MPV 7.1 02/02/2015     BMP:    Lab Results   Component Value Date    NA 138 02/02/2015    K 4.1 02/02/2015    CL 98 02/02/2015    CO2 27 02/02/2015    BUN 7 02/02/2015    LABALBU 4.3 08/26/2014    CREATININE 0.7 02/02/2015    CREATININE 1.0 08/17/2011    CALCIUM 8.9 02/02/2015    GFRAA >60 02/02/2015    GFRAA 157 08/19/2011    LABGLOM >60 02/02/2015    GLUCOSE 96 02/02/2015     Impression :  Partial complex seizures,  Seizures are fairly well-controlled now the patient is taking his medications regularly and is also off Wellbutrin    Plan :  Continue Keppra 1000 mg twice a day  Return in 6 months    Please note a portion of  this chart was generated using dragon dictation software. Although every effort was made to ensure the accuracy of this automated transcription, some errors in transcription may have occurred.

## 2015-04-14 NOTE — Patient Instructions (Signed)
Please call with any questions or concerns:   [facility]  @ [phone].    LAB RESULTS:  Please obtain any labs or diagnostic tests as discussed today.   You may call the office to check the results.  Please allow  3 to 7 days for us to get these results.    MEDICATION LIST:  Please bring an accurate list of your medications to every visit.    APPOINTMENT CONFIRMATION:  We will call you the day before your scheduled appointment to confirm.   If we are unable to reach you, you MUST call back by the end of the day to confirm the appointment or we may be forced to cancel.           Stopping Smoking: Care Instructions  Your Care Instructions  Cigarette smokers crave the nicotine in cigarettes. Giving it up is much harder than simply changing a habit. Your body has to stop craving the nicotine. It is hard to quit, but you can do it. There are many tools that people use to quit smoking. You may find that combining tools works best for you.  There are several steps to quitting. First you get ready to quit. Then you get support to help you. After that, you learn new skills and behaviors to become a nonsmoker. For many people, a necessary step is getting and using medicine.  Your doctor will help you set up the plan that best meets your needs. You may want to attend a smoking cessation program to help you quit smoking. When you choose a program, look for one that has proven success. Ask your doctor for ideas. You will greatly increase your chances of success if you take medicine as well as get counseling or join a cessation program.  Some of the changes you feel when you first quit tobacco are uncomfortable. Your body will miss the nicotine at first, and you may feel short-tempered and grumpy. You may have trouble sleeping or concentrating. Medicine can help you deal with these symptoms. You may struggle with changing your smoking habits and rituals. The last step is the tricky one: Be  prepared for the smoking urge to continue for a time. This is a lot to deal with, but keep at it. You will feel better.  Follow-up care is a key part of your treatment and safety. Be sure to make and go to all appointments, and call your doctor if you are having problems. It???s also a good idea to know your test results and keep a list of the medicines you take.  How can you care for yourself at home?  ?? Ask your family, friends, and coworkers for support. You have a better chance of quitting if you have help and support.  ?? Join a support group, such as Nicotine Anonymous, for people who are trying to quit smoking.  ?? Consider signing up for a smoking cessation program, such as the American Lung Association's Freedom from Smoking program.  ?? Set a quit date. Pick your date carefully so that it is not right in the middle of a big deadline or stressful time. Once you quit, do not even take a puff. Get rid of all ashtrays and lighters after your last cigarette. Clean your house and your clothes so that they do not smell of smoke.  ?? Learn how to be a nonsmoker. Think about ways you can avoid those things that make you reach for a cigarette.  ??   Avoid situations that put you at greatest risk for smoking. For some people, it is hard to have a drink with friends without smoking. For others, they might skip a coffee break with coworkers who smoke.  ?? Change your daily routine. Take a different route to work or eat a meal in a different place.  ?? Cut down on stress. Calm yourself or release tension by doing an activity you enjoy, such as reading a book, taking a hot bath, or gardening.  ?? Talk to your doctor or pharmacist about nicotine replacement therapy, which replaces the nicotine in your body. You still get nicotine but you do not use tobacco. Nicotine replacement products help you slowly reduce the amount of nicotine you need. These products come in several forms, many of them available over-the-counter:  ?? Nicotine  patches  ?? Nicotine gum and lozenges  ?? Nicotine inhaler  ?? Ask your doctor about bupropion (Wellbutrin) or varenicline (Chantix), which are prescription medicines. They do not contain nicotine. They help you by reducing withdrawal symptoms, such as stress and anxiety.  ?? Some people find hypnosis, acupuncture, and massage helpful for ending the smoking habit.  ?? Eat a healthy diet and get regular exercise. Having healthy habits will help your body move past its craving for nicotine.  ?? Be prepared to keep trying. Most people are not successful the first few times they try to quit. Do not get mad at yourself if you smoke again. Make a list of things you learned and think about when you want to try again, such as next week, next month, or next year.  Where can you learn more?  Go to [email or web address] and sign in to your MyChart account. Enter Y522 in the Search Health Information box to learn more about ???Stopping Smoking: Care Instructions.???    If you do not have an account, please click on the ???Sign Up Now??? link.  ?? 2006-2016 [employer]. Care instructions adapted under license by [facility]. This care instruction is for use with your licensed healthcare professional. If you have questions about a medical condition or this instruction, always ask your healthcare professional. Healthwise, Incorporated disclaims any warranty or liability for your use of this information.  Content Version: 11.0.578772; Current as of: Oct 17, 2014

## 2015-05-13 ENCOUNTER — Inpatient Hospital Stay
Admit: 2015-05-13 | Discharge: 2015-05-13 | Disposition: A | Discharge status: Home / Self Care | Attending: Family Medicine

## 2015-05-13 DIAGNOSIS — R569 Unspecified convulsions: Secondary | ICD-10-CM

## 2015-05-13 NOTE — ED Notes (Signed)
Pt presented to ED with s/p seizure x2 today. Pt states last dose of keppra yesterday. Pt states he just needs to eat and take medications. PA at bedside. Pt explaining to PA that mother called EMS while he was sleeping and he woke up in EMS. Pt states "this is the same as last time." pt wanting to leave. PA talking with MD to discuss further tx.      Charleston Ropesrystal Mohogany Toppins, RN  05/13/15 [id number]

## 2015-05-13 NOTE — ED Provider Notes (Signed)
I independently examined and evaluated Annell GreeningErnest J Lisenby.    In brief, 29 year old male with a known seizure disorder who had a seizure.  He arrives to the emergency department not post ictal and quite irritated with his family for calling the squad.  He declines any recent illness.  He states he is compliant with his medications.  From his initial presentation in the emergency department he declined any additional evaluation or workup    Focused exam revealed and awake alert and oriented patient with a GCS of 15.  No mouth or tongue bleeding.  He is moving his upper extremities and lower extremities normally and has a  Normal gait.  Cranial nerves are grossly intact.  He declined any additional physical exam or evaluation    ED course: 29 year old male who is medically competent to make his own decisions and was not post ictal who has a known seizure disorder and is not interested in any additional workup declines further evaluation.  I believe that this is actually probably an appropriate decision, can find no evidence of encephalopathy  Or focal neurologic findings.  He was discharged home    All diagnostic, treatment, and disposition decisions were made by myself in conjunction with the advanced practice provider.    For all further details of the patient's emergency department visit, please see the advanced practice provider's documentation.    Comment: Please note this report has been produced using speech recognition software and may contain errors related to that system including errors in grammar, punctuation, and spelling, as well as words and phrases that may be inappropriate. If there are any questions or concerns please feel free to contact the dictating provider for clarification.     Nadyne CoombesScott A Carry Weesner, MD  05/15/15 1328

## 2015-05-13 NOTE — ED Provider Notes (Signed)
[facility] EMERGENCY DEPT  eMERGENCY dEPARTMENT eNCOUnter      Pt Name: Roy Weaver  MRN: [id number]  Birthdate 1986/02/02  Date of evaluation: 05/13/2015  Provider: Vedia Coffer, NP    CHIEF COMPLAINT       Chief Complaint   Patient presents with   ??? Seizures     x2          HISTORY OF PRESENT ILLNESS  (Location/Symptom, Timing/Onset, Context/Setting, Quality, Duration, Modifying Factors, Severity.)   Roy Weaver is a 29 y.o. male who presents to the emergency department By squad. History of present illness provided by the patient. Patient reports underlying seizure disorder history. He takes Keppra 1500 mg twice a day. Has a follow-up appointment with his neurologist in one month. He reports last oral intake with medial yesterday afternoon. He only takes his medication with food. He has not eaten or taken his medications since yesterday evening and did not eat today. Reports working. Reports he was tired so he fell asleep earlier this afternoon. He wasn't hungry so didn't take his medicines. As he was falling asleep he is talking to his mother. He reports that he proceeded to fall asleep. Mother reports that he began to shake and then subsequent to called 911. Reports this is same exact thing happened last time. He reports that he hadn't eaten not taken his Keppra and proceeded to begin to shake his mother called 911 at that time. He reports sidewalk up in the back of an eminence. Denies biting his cheek. Denies bowel or bladder incontinence. Reports hitting his head lightly. No headache no nausea vomiting neck pain stiffness abdominal pain. Reports is the same exact thing that occurred last time. Denies any change or new onset symptomology. Denies chest pain shortness of breath visual disturbance.       Nursing Notes were reviewed and I agree.    REVIEW OF SYSTEMS    (2-9 systems for level 4, 10 or more for level 5)     Review of Systems    Total 10 systems reviewed, please see pertinent positives for  negatives above in the history of present illness.      PAST MEDICAL HISTORY         Diagnosis Date   ??? Hypertension    ??? Migraine    ??? Pneumonia    ??? Psychiatric problem    ??? Seizures (HCC)        SURGICAL HISTORY     History reviewed. No pertinent past surgical history.    CURRENT MEDICATIONS       Discharge Medication List as of 05/13/2015  5:08 PM      CONTINUE these medications which have NOT CHANGED    Details   levETIRAcetam (KEPPRA) 1000 MG tablet Take 1 tablet by mouth 2 times daily, Disp-60 tablet, R-6             ALLERGIES     Review of patient's allergies indicates no known allergies.    FAMILY HISTORY           Problem Relation Age of Onset   ??? Diabetes Father      Family Status   Relation Status   ??? Father Alive   ??? Mother Alive        SOCIAL HISTORY      reports that he has been smoking Cigarettes.  He has a 2.00 pack-year smoking history. He quit smokeless tobacco use about 19 months ago. He reports that  he drinks alcohol. He reports that he uses illicit drugs, including Marijuana.    PHYSICAL EXAM    (up to 7 for level 4, 8 or more for level 5)   ED Triage Vitals   BP Temp Temp Source Pulse Resp SpO2 Height Weight   05/13/15 1711 05/13/15 1622 05/13/15 1622 05/13/15 1622 05/13/15 1622 05/13/15 1622 05/13/15 1622 05/13/15 1622   109/66 98.4 ??F (36.9 ??C) Oral 109 12 96 % 6\' 5"  (1.956 m) 179 lb 10.8 oz (81.5 kg)       Physical Exam   Constitutional: He is oriented to person, place, and time. He appears well-developed and well-nourished.   HENT:   Head: Normocephalic and atraumatic.   No abrasions contusions or lacerations. No midline neck tenderness.   Eyes: Conjunctivae are normal. Pupils are equal, round, and reactive to light.   Neck: Normal range of motion. Neck supple.   Cardiovascular: Normal rate and normal heart sounds.    Pulmonary/Chest: Effort normal and breath sounds normal.   Abdominal: Soft. Bowel sounds are normal.   Musculoskeletal: Normal range of motion. He exhibits no edema.    Neurological: He is alert and oriented to person, place, and time.   GCS of 15. Patient moving all extremities easily. Actively engaged in his phone. He exhibits no evidence of postictal state with full recollection of events.   Skin: Skin is warm and dry. He is not diaphoretic.         DIAGNOSTIC RESULTS     RADIOLOGY:   Non-plain film images such as CT, Ultrasound and MRI are read by the radiologist. Plain radiographic images are visualized and preliminarily interpreted by Vedia CofferMichael C Isadore Palecek, NP with the below findings:        Interpretation per the Radiologist below, if available at the time of this note:    No orders to display       LABS:  Labs Reviewed   LEVETIRACETAM LEVEL   URINE DRUG SCREEN   CBC WITH AUTO DIFFERENTIAL   COMPREHENSIVE METABOLIC PANEL       All other labs were within normal range or not returned as of this dictation.    EMERGENCY DEPARTMENT COURSE and DIFFERENTIAL DIAGNOSIS/MDM:   Vitals:    Vitals:    05/13/15 1622 05/13/15 1711   BP:  109/66   Pulse: 109 95   Resp: 12 18   Temp: 98.4 ??F (36.9 ??C)    TempSrc: Oral    SpO2: 96% 98%   Weight: 179 lb 10.8 oz (81.5 kg)    Height: 6\' 5"  (1.956 m)        MDM    Medications - No data to display    See history of present illness regarding presentation. Overall his blood pressure 109/66 pulse 109 on arrival. He is not postictal. He is not exhibiting any neurological deficit. His head is normocephalic/atraumatic. Patient reports this is an exact thing happened last time when his mother witnessed his behavior. He does not want any blood work at this time. He reports nothing is change. He has neurology follow-up in one month. This actively using his phone. Completely lucid to events. There appears to be no postictal occurrence with this presentation.    Had informed consent disposition planning with the patient. He reports that this is exact same thing that happened last time. He reports nothing is going to be noted in the blood work. He does not want  to repeat any blood work. He was to  go home. Discussed with patient cannot definitively rule out acute abnormality at this time but if he reports that it is the same thing and is occurred in the past and he is comfortable going home. I do feel at this time he is not postictal and clear and lucid to make his own decision at this time. During most of my discussion with the patient and he is actively engaged in a cell phone without any difficulty attempting to contact his mother.    Records reviewed. Patient's been seen a multitude of times for convulsions and seizure activity. He is not exhibiting post ictal state or status epilepticus.    Electrolyte abnormality, subarachnoid hemorrhage, drop attack, pulmonary was him, other    PROCEDURES:  Procedures    FINAL IMPRESSION      1. Convulsions, unspecified convulsion type (HCC)    2. History of seizure          DISPOSITION/PLAN   DISPOSITION Decision to Discharge    PATIENT REFERRED TO:  Baldwin Jamaica, MD  [address] [place] [address]  [phone]    Schedule an appointment as soon as possible for a visit  To discuss ongoing seizure activity and possible change to medication    [facility]  [address] [place] [address]  [phone]  Go to  If symptoms worsen      DISCHARGE MEDICATIONS:  Discharge Medication List as of 05/13/2015  5:08 PM          (Please note that portions of this note were completed with a voice recognition program.  Efforts were made to edit the dictations but occasionally words are mis-transcribed.)    This patient was seen and evaluated in conjunction with attending physician, Dr. Rodman Pickle, and he is in agreement with the assessment and plan.    Vedia Coffer, NP       Donney Rankins, NP  05/13/15 1946

## 2015-05-13 NOTE — ED Notes (Signed)
Bed: B-07  Expected date:   Expected time:   [facility]  Comments:  29 y/o SZ     CounsellorCrystal Speier, RN  05/13/15 1622

## 2015-05-13 NOTE — ED Notes (Signed)
Pt not allowing medic or RN to obtain blood work or hook pt up at cardiac monitor. PA notified.      Charleston Ropesrystal Sarenity Ramaker, RN  05/13/15 1640

## 2015-06-27 DIAGNOSIS — R569 Unspecified convulsions: Secondary | ICD-10-CM

## 2015-06-27 NOTE — ED Notes (Signed)
Pt mother called and updated on pts condition. Will be in route soon for discharge.     Benson Norway, RN  06/27/15 2119

## 2015-06-27 NOTE — ED Provider Notes (Signed)
Triage Chief Complaint:   Seizures    HOPI:  Roy Weaver is a 30 y.o. male that presents To the emergency department via EMS after having a seizure at home.  The patient has been compliant with his medications, according to him, for the last 2-1/2 weeks.  Patient was last seen in this department on the 20th December.  Patient apparently had a tonic-clonic seizure with postictal period. Patient is not complaining of any head or neck pain at the present time.    ROS:  At least 10 systems reviewed and otherwise acutely negative except as in the HOPI.    Past Medical History   Diagnosis Date   ??? Hypertension    ??? Migraine    ??? Pneumonia    ??? Psychiatric problem    ??? Seizures (HCC)      History reviewed. No pertinent past surgical history.  Family History   Problem Relation Age of Onset   ??? Diabetes Father      Social History     Social History   ??? Marital status: Single     Spouse name: N/A   ??? Number of children: 0   ??? Years of education: 30     Occupational History   ??? Not on file.     Social History Main Topics   ??? Smoking status: Current Some Day Smoker     Packs/day: 1.00     Years: 2.00     Types: Cigarettes   ??? Smokeless tobacco: Former Neurosurgeon     Quit date: 10/04/2013   ??? Alcohol use 0.0 oz/week     0 Standard drinks or equivalent per week      Comment: occasioanly   ??? Drug use: Yes     Special: Marijuana      Comment: smokes once every 3 weeks   ??? Sexual activity: Not on file     Other Topics Concern   ??? Not on file     Social History Narrative    ** Merged History Encounter **          Current Facility-Administered Medications   Medication Dose Route Frequency Provider Last Rate Last Dose   ??? levetiracetam (KEPPRA) 500 mg/100 mL IVPB  500 mg Intravenous Once San Jetty, MD   500 mg at 06/27/15 2111     Current Outpatient Prescriptions   Medication Sig Dispense Refill   ??? levETIRAcetam (KEPPRA) 1000 MG tablet Take 1 tablet by mouth 2 times daily 60 tablet 6     No Known Allergies    Nursing Notes  Reviewed    Physical Exam:  ED Triage Vitals   BP Temp Temp Source Pulse Resp SpO2 Height Weight   06/27/15 1915 06/27/15 1915 06/27/15 1915 06/27/15 1915 06/27/15 1915 06/27/15 1915 -- 06/27/15 1915   140/99 98.2 ??F (36.8 ??C) Oral 92 18 99 %  182 lb 8.7 oz (82.8 kg)     GENERAL APPEARANCE: Awake and alert. Cooperative. No acute distress.   HEAD: Normocephalic. Atraumatic.  EYES: EOM's grossly intact. Sclera anicteric.  ENT: Mucous membranes are moist. Tolerates saliva. No trismus.  NECK: Supple. No meningismus. Trachea midline.  HEART: RRR.  LUNGS: Respirations unlabored. CTAB  ABDOMEN: Soft. Non-tender. No guarding or rebound.  EXTREMITIES: No acute deformities.  SKIN: Warm and dry.  NEUROLOGICAL: No gross facial drooping. Moves all 4 extremities spontaneously.    Physical Exam    I have reviewed and interpreted all of the currently available lab  results from this visit (if applicable):  Results for orders placed or performed during the hospital encounter of 06/27/15   Levetiracetam Level   Result Value Ref Range    Levetiracetam Lvl <2.0 (L) 6.0 - 46.0 ug/mL    KEPPRA Dose Amt Unknown    Basic Metabolic Panel   Result Value Ref Range    Sodium 137 136 - 145 mmol/L    Potassium 5.7 (H) 3.5 - 5.1 mmol/L    Chloride 99 99 - 110 mmol/L    CO2 21 21 - 32 mmol/L    Anion Gap 17 (H) 3 - 16    Glucose 120 (H) 70 - 99 mg/dL    BUN 9 7 - 20 mg/dL    CREATININE 0.9 0.9 - 1.3 mg/dL    GFR Non-African American >60 >60    GFR African American >60 >60    Calcium 9.1 8.3 - 10.6 mg/dL   CBC Auto Differential   Result Value Ref Range    WBC 7.4 4.0 - 11.0 K/uL    RBC 5.07 4.20 - 5.90 M/uL    Hemoglobin 15.3 13.5 - 17.5 g/dL    Hematocrit 16.1 09.6 - 52.5 %    MCV 88.3 80.0 - 100.0 fL    MCH 30.2 26.0 - 34.0 pg    MCHC 34.2 31.0 - 36.0 g/dL    RDW 04.5 40.9 - 81.1 %    Platelets 219 135 - 450 K/uL    MPV 8.1 5.0 - 10.5 fL    Neutrophils % 83.4 %    Lymphocytes Relative 10.9 %    Monocytes % 4.8 %    Eosinophils Relative Percent 0.4  %    Basophils % 0.5 %    Neutrophils # 6.2 1.7 - 7.7 K/uL    Lymphocytes # 0.8 (L) 1.0 - 5.1 K/uL    Monocytes # 0.4 0.0 - 1.3 K/uL    Eosinophils # 0.0 0.0 - 0.6 K/uL    Basophils # 0.0 0.0 - 0.2 K/uL        Radiographs (if obtained):   The following radiograph was interpreted by myself in the absence of a radiologist:   Radiologist's Report Reviewed:  No orders to display       EKG (if obtained): (All EKG's are interpreted by myself in the absence of a cardiologist)    Procedures    MDM:  After my initial evaluation, the patient is adamant about the fact that he is taking his medications as prescribed.  Patient's Keppra level however was 0, so I do find is very hard to believe the present time.  Patient states he is not taking any alcohol as well, does not appear to be intoxicated.  Patient was given 500 mg of Keppra orally, was also followed with another 500 mg IV.  I feel this is inadequate loading dose, and the patient states that he does have a prescription for more medication at home.  I am been provided with a prescription for medication as well prior to his discharge.  Mother was contacted, and she did come to pick him up at the hospital and we did let her know that his levels were low.    Clinical Impression:  1. Seizure (HCC)    2. Seizure disorder (HCC)      (Please note that portions of this note may have been completed with a voice recognition program. Efforts were made to edit the dictations but occasionally words are mis-transcribed.)  San Jetty, MD       San Jetty, MD  06/27/15 [phone]

## 2015-06-27 NOTE — ED Notes (Signed)
Pt mother called, pt is ready for discharge.     Benson Norway, RN  06/27/15 2159

## 2015-06-27 NOTE — ED Notes (Signed)
Bed: B-34  Expected date: 06/27/15  Expected time: 6:54 PM  Means of arrival: [facility]  Comments:  30 y/o seizure     Marcellina Millin, [place]  06/27/15 1914

## 2015-06-27 NOTE — ED Triage Notes (Signed)
Pt presents to the ER via EMS AOX4 after having a seizure, blood sugar was 124. Per EMS pt seizure lasted one minute per mom, and pt has a small laceration on the left side of tongue. Pt initially slow to respond to questions, but states that he was lying down in bed and started having a seizure followed a small emesis. Pt states that he's been compliant with seizure medications over a week.

## 2015-06-28 ENCOUNTER — Inpatient Hospital Stay
Admit: 2015-06-28 | Discharge: 2015-06-28 | Disposition: A | Discharge status: Home / Self Care | Attending: Emergency Medicine

## 2015-06-28 LAB — CBC WITH AUTO DIFFERENTIAL
Basophils %: 0.5 %
Basophils Absolute: 0 10*3/uL (ref 0.0–0.2)
Eosinophils %: 0.4 %
Eosinophils Absolute: 0 10*3/uL (ref 0.0–0.6)
Hematocrit: 44.7 % (ref 40.5–52.5)
Hemoglobin: 15.3 g/dL (ref 13.5–17.5)
Lymphocytes %: 10.9 %
Lymphocytes Absolute: 0.8 10*3/uL — ABNORMAL LOW (ref 1.0–5.1)
MCH: 30.2 pg (ref 26.0–34.0)
MCHC: 34.2 g/dL (ref 31.0–36.0)
MCV: 88.3 fL (ref 80.0–100.0)
MPV: 8.1 fL (ref 5.0–10.5)
Monocytes %: 4.8 %
Monocytes Absolute: 0.4 10*3/uL (ref 0.0–1.3)
Neutrophils %: 83.4 %
Neutrophils Absolute: 6.2 10*3/uL (ref 1.7–7.7)
Platelets: 219 10*3/uL (ref 135–450)
RBC: 5.07 M/uL (ref 4.20–5.90)
RDW: 13 % (ref 12.4–15.4)
WBC: 7.4 10*3/uL (ref 4.0–11.0)

## 2015-06-28 LAB — BASIC METABOLIC PANEL
Anion Gap: 17 — ABNORMAL HIGH (ref 3–16)
BUN: 9 mg/dL (ref 7–20)
CO2: 21 mmol/L (ref 21–32)
Calcium: 9.1 mg/dL (ref 8.3–10.6)
Chloride: 99 mmol/L (ref 99–110)
Creatinine: 0.9 mg/dL (ref 0.9–1.3)
GFR African American: >60 (ref >60)
GFR Non-African American: >60 (ref >60)
Glucose: 120 mg/dL — ABNORMAL HIGH (ref 70–99)
Potassium: 5.7 mmol/L — ABNORMAL HIGH (ref 3.5–5.1)
Sodium: 137 mmol/L (ref 136–145)

## 2015-06-28 LAB — LEVETIRACETAM LEVEL: Levetiracetam Lvl: <2 ug/mL — ABNORMAL LOW (ref 6.0–46.0)

## 2015-06-28 MED ORDER — LEVETIRACETAM IN NACL 500 MG/100ML IV SOLN
500 MG/100ML | Freq: Once | INTRAVENOUS | Status: AC
Start: 2015-06-28 — End: 2015-06-27
  Administered 2015-06-28: 02:00:00 via INTRAVENOUS

## 2015-06-28 MED ORDER — LEVETIRACETAM 500 MG PO TABS
500 MG | Freq: Once | ORAL | Status: AC
Start: 2015-06-28 — End: 2015-06-27
  Administered 2015-06-28: 02:00:00 500 mg via ORAL

## 2015-06-28 MED ORDER — LEVETIRACETAM 1000 MG PO TABS
1000 MG | ORAL_TABLET | Freq: Two times a day (BID) | ORAL | 6 refills | Status: DC
Start: 2015-06-28 — End: 2015-07-24

## 2015-06-28 MED FILL — LEVETIRACETAM IN NACL 500 MG/100ML IV SOLN: 500 MG/100ML | INTRAVENOUS | Qty: 100

## 2015-06-28 MED FILL — LEVETIRACETAM 500 MG PO TABS: 500 MG | ORAL | Qty: 1

## 2015-07-11 ENCOUNTER — Inpatient Hospital Stay
Admit: 2015-07-11 | Discharge: 2015-07-12 | Disposition: A | Discharge status: Home / Self Care | Attending: Emergency Medicine

## 2015-07-11 DIAGNOSIS — R569 Unspecified convulsions: Secondary | ICD-10-CM

## 2015-07-11 NOTE — ED Provider Notes (Signed)
[facility] ED  eMERGENCY dEPARTMENT eNCOUnter        Pt Name: Roy Weaver  MRN: [id number]  Birthdate [birth year]  Date of evaluation: 07/11/2015  Provider: [name], PA-C  PCP: Baldwin Jamaica, MD  ED Attending: [name], MD    History provided by the patient    CHIEF COMPLAINT:     Chief Complaint   Patient presents with   ??? Seizures     In by squad from home; 911 called by mom for seizure.  alert and oriented during triage        HISTORY OF PRESENT ILLNESS:      Roy Weaver is a 30 y.o. male who arrives to the emergency department via EMS.  The patient was brought in to be evaluated for seizure disorder.  The patient has known seizure disorder and is supposed to be on Keppra 1 g twice daily.  He admits that he did not take his medicine today.  He slept much of the day.  He states he believes he had a seizure while he was sleeping and his mother (who was home with him) called 911.  The patient reports having breakthrough seizures about once every 2-3 weeks.  Upon arrival by EMS, the patient is awake, alert and oriented.  He denies being in any pain.  He admits to marijuana use, but denies any other drug or alcohol use.     Nursing Notes were reviewed     REVIEW OF SYSTEMS:     Review of Systems   Constitutional: Negative for activity change, appetite change, chills and fever.   HENT: Negative.    Eyes: Negative for visual disturbance.   Respiratory: Negative for shortness of breath.    Cardiovascular: Negative for chest pain.   Gastrointestinal: Negative for abdominal pain.   Genitourinary: Negative.    Musculoskeletal: Negative for back pain, gait problem and neck pain.   Skin: Negative for wound.   Neurological: Positive for seizures. Negative for dizziness and headaches.   All other systems reviewed and are negative.      Except as noted above in the ROS, all other systems were reviewed and negative.         PAST MEDICAL HISTORY:     Past Medical History   Diagnosis  Date   ??? Hypertension    ??? Migraine    ??? Pneumonia    ??? Psychiatric problem    ??? Seizures (HCC)          SURGICAL HISTORY:    History reviewed. No pertinent past surgical history.      CURRENT MEDICATIONS:       Discharge Medication List as of 07/11/2015  7:27 PM      CONTINUE these medications which have NOT CHANGED    Details   levETIRAcetam (KEPPRA) 1000 MG tablet Take 1 tablet by mouth 2 times daily, Disp-60 tablet, R-6               ALLERGIES:    Review of patient's allergies indicates no known allergies.    FAMILY HISTORY:       Family History   Problem Relation Age of Onset   ??? Diabetes Father           SOCIAL HISTORY:       Social History     Social History   ??? Marital status: Single     Spouse name: N/A   ??? Number of children:  0   ??? Years of education: 54     Social History Main Topics   ??? Smoking status: Current Some Day Smoker     Packs/day: 1.00     Years: 2.00     Types: Cigarettes   ??? Smokeless tobacco: Former Neurosurgeon     Quit date: 10/04/2013   ??? Alcohol use 0.0 oz/week     0 Standard drinks or equivalent per week      Comment: occasioanly   ??? Drug use: Yes     Special: Marijuana      Comment: smokes once every 3 weeks   ??? Sexual activity: Not Asked     Other Topics Concern   ??? None     Social History Narrative    ** Merged History Encounter **            SCREENINGS:             PHYSICAL EXAM:     ED Triage Vitals   BP Temp Temp Source Pulse Resp SpO2 Height Weight   07/11/15 1839 07/11/15 1839 07/11/15 1839 07/11/15 1839 07/11/15 1839 07/11/15 1839 07/11/15 1839 07/11/15 1839   131/65 98.6 ??F (37 ??C) Oral 83 20 96 %  (1.88 m) 190 lb (86.2 kg)       Physical Exam    CONSTITUTIONAL: Awake and alert. Cooperative. Well-developed. Well-nourished. Non-toxic. No acute distress.  HENT: Normocephalic. Atraumatic. External ears normal, without discharge. No nasal discharge. Oropharynx clear. Mucous membranes moist.  EYES: Conjunctiva non-injected. No scleral icterus. PERRL. EOM's grossly intact.    NECK:  Supple. Normal ROM.   CARDIOVASCULAR: RRR. No Murmer. Intact distal pulses.  PULMONARY/CHEST WALL: Effort normal. No tachypnea. Lungs clear to ausculation.   ABDOMEN: Normal BS. Soft. Nondistended. No tenderness to palpate. No guarding.  GU/ANORECTAL: Not assessed  MUSKULOSKELETAL: Normal ROM. No acute deformities. No edema. No tenderness to palpate.  SKIN: Warm and dry.    NEUROLOGICAL: Alert and oriented x 3. GCS 15. CN II-XII grossly intact. Strength is 5/5 in all extremities and sensation is intact. Normal gait.   PSYCHIATRIC: Normal affect        DIAGNOSTIC RESULTS:     LABS:    Results for orders placed or performed during the hospital encounter of 07/11/15   Levetiracetam level   Result Value Ref Range    KEPPRA Dose Amt Unknown            PROCEDURES:   N/A    CRITICAL CARE TIME:   N/A    CONSULTS:  None      EMERGENCY DEPARTMENT COURSE and DIFFERENTIAL DIAGNOSIS/MDM:   Vitals:    Vitals:    07/11/15 1839 07/11/15 1928 07/11/15 1931   BP: 131/65 129/65 126/63   Pulse: 83     Resp: 20     Temp: 98.6 ??F (37 ??C)     TempSrc: Oral     SpO2: 96% 96% 96%   Weight: 190 lb (86.2 kg)     Height:  (1.88 m)         Patient was given the following medications:  Medications   levETIRAcetam (KEPPRA) tablet 1,000 mg (1,000 mg Oral Given 07/11/15 1932)         Patient was evaluated by both myself and [name], MD. The patient came in after having a seizure today.  He reports seizure disorder and states he has breakthrough seizures about once every 2-3 weeks.  After he arrived  I ordered a Keppra level and BMP as well as IV loading dose of Keppra.  However, the patient has been completely awake, alert and oriented with a normal exam through his stay and declines this workup.  He was given 1 g of Keppra orally and will ultimately be discharged to start back on his twice a day dosing.  He is discharged in good condition.    I estimate there is LOW risk for MENINGITIS, SKULL/FACIAL FX, SUBARACHNOID HEMORRHAGE,  INTRACRANIAL HEMORRHAGE, SUBDURAL HEMATOMA, OR STROKE, thus I consider the discharge disposition reasonable. Annell Greening and I have discussed the diagnosis and risks, and we agree with discharging home to follow-up with their primary doctor. We also discussed returning to the Emergency Department immediately if new or worsening symptoms occur. We have discussed the symptoms which are most concerning (e.g., changing or worsening pain, weakness, vomiting, fever) that necessitate immediate return.      FINAL IMPRESSION:      1. Seizure [facility])          DISPOSITION/PLAN:   DISPOSITION Decision To Discharge      PATIENT REFERRED TO:  Baldwin Jamaica, MD  [address] [place] [address]  [phone]    Schedule an appointment as soon as possible for a visit      [facility] ED  [address]  [place] [address]  [phone]    As needed, If symptoms worsen      DISCHARGE MEDICATIONS:  Discharge Medication List as of 07/11/2015  7:27 PM                     (Please note that portions of this note were completed with a voice recognition program.  Efforts were made to edit the dictations, but occasionally words are mis-transcribed.)    [name], PA-C (electronically signed)             [name], PA  07/11/15 2012

## 2015-07-11 NOTE — ED Provider Notes (Signed)
I performed a medical screening history and physical exam on this patient.    HISTORY OF PRESENT ILLNESS  Roy Weaver is a 30 y.o. male who presents back to the emergency room after having a seizure in the waiting room.  The patient had refused most of his work up previously. He wanted to be discharged, but agreed to received a loading dose of Keppra, which he had not been taking lately. At this time, he is unable to give history     PHYSICAL EXAM  ED Triage Vitals   BP Temp Temp src Pulse Resp SpO2 Height Weight   -- -- -- -- -- -- -- --                 On exam, the patient is awake but lethargic.  He is drooling, but able to follow basic commands.  He moves all extremities.  Face is symmetric.  Breathing is unlabored.  Mucous membranes are moist.           Herminio Commons, MD  07/11/15 2012

## 2015-07-11 NOTE — ED Notes (Signed)
Report called to floor. Pt confirmed to be on portable tele.     Jovita Kussmaul, RN  07/11/15 [phone]

## 2015-07-11 NOTE — ED Triage Notes (Signed)
Pt was here being seen for a seizure earlier, pt did not want to be worked up and refused treatment so he was discharged. Pt was sitting in waiting room and had a witnessed seizure by staff. Pt was caught by employee, pt was shaking and foaming at the mouth. Pt back in room post ictal. Seizure precautions in place in bed. Pt able to be woken by voice.

## 2015-07-11 NOTE — ED Notes (Signed)
Bed: 12-01  Expected date:   Expected time:   Means of arrival:   Comments:  Squad-clean :-)     Roy Weaver  07/11/15 2002

## 2015-07-11 NOTE — Other (Signed)
Patient Acct Nbr:  [id number]  Primary AUTH/CERT:    Primary Insurance Company Name:   [employer] HE[employer]/HMO  Primary Insurance Plan Name:  [facility] COMMUNITY PLAN M/CAID HMO  Primary Insurance Group Number:  [facility]  Primary Insurance Plan Type: [employer] Insurance Policy Number:  [id number]

## 2015-07-11 NOTE — H&P (Signed)
[facility]    07/11/2015 11:32 PM    PCP:  Baldwin Jamaica, MD (Tel: [phone] )    Chief complaint:  Seizure    History of Present Illness:  Roy Weaver is a 30 y.o. male with a history of partial complex seizures with numerous ER visits and hospitalizations for recurrent seizures due to noncompliance with his Keppra along with cocaine and cannabis positive urine tox in past. He presented earlier today to the ER s/p seizure at home. At that time he was fully awake and refused any evaluation for his seizure. He was loaded with a gram of Keppra, his home dose and was discharge. While sitting in ER waiting room for his transportation when he had his second seizure of the day that was described as convulsive and was foaming at the mouth. This was witnessed my medical staff and patient did not fall out of chair or hit his head. He was brought back into the ER for further evaluation and treatment. He was given keppra load and ativan. Per ED patient had recovered from his second seizure and had yet a third seizure of which lasted about one minute and is documented in ED note as patient becoming stiff with minimal convulsions witnessed by ER nurse. He was given keppra load again. Patient has had a total of 3 gram of keppra while in ER.     At present patient is post-ictal, will awaken to name and mumble a few words that are mostly incoherent and then fall back asleep. Unable to obtain any further HPI due to patient's current state. Per ER patient had admitted to noncompliance with his keppra and stated he had only missed his am dose. Patient also denied having any recent trauma to head or fall at home.     History obtained from ED provider and EMR.     REVIEW OF SYSTEMS:   As per HPI due to patient is post-ictal and unable to answer questions appropriately.     Past Medical History:  Seizures     Past Surgical History:  None     Medications Prior to Admission:  Prior to  Admission medications    Medication Sig Start Date End Date Taking? Authorizing Provider   levETIRAcetam (KEPPRA) 1000 MG tablet Take 1 tablet by mouth 2 times daily 06/27/15   San Jetty, MD       Allergies:  No Known Allergies     Social History:  The patient currently lives at home.   Tobacco:  reports that he has been smoking Cigarettes.  He has a 2.00 pack-year smoking history. He quit smokeless tobacco use about 21 months ago.  ETOH:  reports that he drinks alcohol.  Occasionally as per chart. Patient not able to answer questions at present secondary to mental status.   Per chart patient also admits to cannabis use, every few weeks.     Family History: Reviewed in detail and negative for DM,early CAD, Cancer and CVA. Positive as follows:  family history includes Diabetes in his father.     PHYSICAL EXAM:  Visit Vitals   ??? BP (!) 112/53   ??? Pulse 70   ??? Resp 17   ??? SpO2 96%     General appearance: No apparent distress. Patient will awaken to name but falls asleep. He is not cooperative with exam.  HEENT Normal cephalic, atraumatic without obvious deformity.  Pupils equal, round, and reactive to light.  Conjunctivae/corneas clear.  Neck: Supple, No jugular venous distention/bruits.  Trachea midline without thyromegaly or adenopathy with full range of motion.  Lungs: Clear to auscultation, bilaterally without Rales/Wheezes/Rhonchi with good respiratory effort.  Heart: Regular rate and rhythm with Normal S1/S2 without murmurs, rubs or gallops, point of maximum impulse non-displaced  Abdomen: Soft, non-tender or non-distended without rigidity or guarding and positive bowel sounds all four quadrants.  Extremities: No clubbing, cyanosis, or edema bilaterally.  Full range of motion without deformity.  Skin: Skin color, texture, turgor normal.  No rashes or lesions.  Neurologic: Lethargic, awakens to name and falls asleep. Mumbles some few incoherent words at times. Neuro exam is severely limited as patient is  uncooperative with exam. He is moving all extremities equally.   Mental status: Unable to assess as stated.   Capillary Refill: Acceptable  < 3 seconds  Peripheral Pulses: +3 Easily felt, not easily obliterated with pressure    Head CT:  I have reviewed the CT with the following interpretation:   Bifrontal and left temporal lobe encephalomalacia. ??No gross acute process.  Soft tissue swelling of the posterior scalp near the vertex.    EKG:  I have reviewed the EKG with the following interpretation: SR with no evidence of acute ischemic changes.    CBC   Recent Labs      07/11/15   2033   WBC  5.7   HGB  14.2   HCT  41.6   PLT  243      RENAL  Recent Labs      07/11/15   2112   NA  136   K  4.2   CL  97*   CO2  27   BUN  8   CREATININE  0.9     LFT'S  Recent Labs      07/11/15   2112   AST  16   ALT  8*   BILITOT  <0.2   ALKPHOS  81     CARDIAC ENZYMES  Recent Labs      07/11/15   2112   CKTOTAL  237       Assessment/Plan:   ?? Recurrent seizures with a history of partial complex seizure followed by Dr. Glean Hess. Patient has had issues with noncompliance in past and keppra level is subtheraputic on this admission. He has also had positive urine tox for cocaine and cannabis in the past. Ethanol level negative now. Head CT did show some posterior scalp tissue swelling following the second seizure, however no trauma with seizure noted as witnessed by ER staff. There are no signs of head trauma on exam. No infectious etiology identified. CK normal. Lactic acid normal. Has received total of 3 gram Keppra load in ER. Will admit with keppra 1,000 mg BID dosing, as per home dose, ativan PRN for seizures, seizure and fall precautions and monitor. Send utox. Dr. Glean Hess called per ER provider and will see patient tomorrow.   ?? Post-ictal state secondary to above. Monitor and management as above with neurovascular checks. Remain NPO until patient is fully awake and alert.       DVT Prophylaxis: Lovenox   Diet:  NPO  Code Status:  Full      Admit as Inpatient. I anticipate hospitalization for greater than two midnights based on the above assessment and plan.     Laurey Arrow, NP    07/11/2015 11:32 PM

## 2015-07-11 NOTE — ED Notes (Signed)
Pt in seizure precautions at this time; pt asking to leave.       Lanora Manis, RN  07/11/15 1901

## 2015-07-11 NOTE — ED Provider Notes (Signed)
I independently performed a history and physical on Roy Weaver.   All diagnostic, treatment, and disposition decisions were made by myself in conjunction with the advanced practice provider.     HPI: Briefly, this is a 30 y.o. male who presents to the ED with the chief complaint of seizure.  I had seen this patient earlier after he had a seizure at home.  Patient has a history of seizures and he arrived to the ED awake and alert and oriented and requested no workup.  He was given his home dose of Keppra and then discharged.  While in the waiting room awaiting his ride, the patient had a second seizure.  Patient denies any complaints to me.    ED Triage Vitals   BP Temp Temp src Pulse Resp SpO2 Height Weight   07/11/15 2012 -- -- 07/11/15 2012 07/11/15 2012 07/11/15 2012 -- --   138/66   64 16 96 %       On physical exam, patient is lying in bed.  Patient is sleepy, but does easily arouse.  He is noted to move all 4 extremities.  Heart is regular rate and rhythm.  Lungs are clear to auscultation bilaterally.  Abdomen is soft and nontender.      EKG:   NSR at 68 bpm.  Normal axis.  No ectopy.  Normal intervals.  Normal QTc   J-point elevation diffusely.  No significant T wave abnormalities  No significant change from EKG dated 05/18/14    MDM: Patient had a second seizure in the waiting room, and then to have a third seizure in the ED.  He was given 2 mg of Ativan and no further seizures.  Case discussed with neurology who recommended giving the patient a total of Keppra 3 g.  Patient will be admitted for ongoing management and observation.  Case discussed with the hospitalist who accepted the patient for admission.  Patient admitted in stable condition.    For further details of [name]'s emergency department encounter, please see documentation by advanced practice provider [name], PA-C.    This chart was generated using the Dragon dictation system. I created this record but it may contain  dictation errors given the limitations of this technology.       [name], MD  07/11/15 2312

## 2015-07-11 NOTE — ED Provider Notes (Signed)
I independently performed a history and physical on Roy Weaver.   All diagnostic, treatment, and disposition decisions were made by myself in conjunction with the advanced practice provider.     HPI: Briefly, this is a 30 y.o. male who presents to the ED with the chief complaint of seizure.  Patient reports that he has a history of seizures, having a seizure every 1-2 weeks.  Patient denies any complaints but notes he did not take his Keppra today.    ED Triage Vitals   BP Temp Temp Source Pulse Resp SpO2 Height Weight   07/11/15 1839 07/11/15 1839 07/11/15 1839 07/11/15 1839 07/11/15 1839 07/11/15 1839 07/11/15 1839 07/11/15 1839   131/65 98.6 ??F (37 ??C) Oral 83 20 96 %  (1.88 m) 190 lb (86.2 kg)     On physical exam, patient is lying in bed in no acute distress.  Triage vitals are unremarkable.  Heart regular rate and rhythm.  Lungs clear auscultation bilaterally.  Abdomen is soft and nontender.  Moves all 4 extremities.  Alert and oriented ??4.    MDM: Patient is alert and oriented and is requesting to leave and has a history of seizures.  Patient will be given his dose of Keppra and was encouraged to take his medications as prescribed.  Patient to follow-up with his PCP and his neurologist.  Patient stable for discharge.      For further details of [name]'s emergency department encounter, please see documentation by advanced practice provider [name], PA-C.    This chart was generated using the Dragon dictation system. I created this record but it may contain dictation errors given the limitations of this technology.       [name], MD  07/11/15 [phone]

## 2015-07-11 NOTE — ED Notes (Signed)
Bed: 12-01  Expected date:   Expected time:   Means of arrival:   Comments:  squad

## 2015-07-11 NOTE — ED Provider Notes (Signed)
[facility] ED  eMERGENCY dEPARTMENT eNCOUnter        Pt Name: Roy Weaver  MRN: [id number]  Birthdate 06-24-1985  Date of evaluation: 07/11/2015  Provider: [name], PA-C  PCP: Baldwin Jamaica, MD  ED Attending: [name], MD    History provided by the patient    CHIEF COMPLAINT:     Chief Complaint   Patient presents with   ??? Seizures     Pt was here being seen for a seizure earlier, pt did not want to be worked up and refused treatment so he was discharged. Pt was sitting in waiting room and had a witnessed seizure by staff. Pt was caught by employee, pt was shaking and foaming at the mouth. Pt back in room post ictal.       HISTORY OF PRESENT ILLNESS:      Roy Weaver is a 30 y.o. male who initially arrived to the ED by EMS after having a seizure at home.  He was seen here in the ED by myself and Dr. Oneida Alar.  He was completely back to normal upon his arrival to the ED.  He refused any workup though was given Keppra 1 g orally.  The patient was discharged and while in the waiting room had a second seizure for which she was brought back into the department.  He was resting on the stretcher and did recover from that seizure before having yet a third seizure which was witnessed by the nurse, was noted to be less than 1 minute in length and consisted of the patient "stiffening up" with minimal convulsing.  There is no family here to help provide history, though based on my discussion with the patient his first presentation, he has known seizure disorder and takes Keppra 1 g daily.  He did not take it today but slept most of the day.  He reports having breakthrough seizures about every 2-3 weeks.  He had not suffered any fall or injury prior to coming into the ED the first time and was not noted to have injured himself upon having any of his.     Nursing Notes were reviewed     REVIEW OF SYSTEMS:     Review of Systems   Unable to perform ROS: Acuity of condition       Except as  noted above in the ROS, all other systems were reviewed and negative.         PAST MEDICAL HISTORY:     Past Medical History   Diagnosis Date   ??? Hypertension    ??? Migraine    ??? Pneumonia    ??? Psychiatric problem    ??? Seizures (HCC)          SURGICAL HISTORY:    History reviewed. No pertinent past surgical history.      CURRENT MEDICATIONS:       Previous Medications    LEVETIRACETAM (KEPPRA) 1000 MG TABLET    Take 1 tablet by mouth 2 times daily         ALLERGIES:    Review of patient's allergies indicates no known allergies.    FAMILY HISTORY:       Family History   Problem Relation Age of Onset   ??? Diabetes Father           SOCIAL HISTORY:       Social History     Social History   ??? Marital status: Single  Spouse name: N/A   ??? Number of children: 0   ??? Years of education: 36     Social History Main Topics   ??? Smoking status: Current Some Day Smoker     Packs/day: 1.00     Years: 2.00     Types: Cigarettes   ??? Smokeless tobacco: Former Neurosurgeon     Quit date: 10/04/2013   ??? Alcohol use 0.0 oz/week     0 Standard drinks or equivalent per week      Comment: occasioanly   ??? Drug use: Yes     Special: Marijuana      Comment: smokes once every 3 weeks   ??? Sexual activity: Not Asked     Other Topics Concern   ??? None     Social History Narrative    ** Merged History Encounter **            SCREENINGS:             PHYSICAL EXAM:     ED Triage Vitals   BP Temp Temp src Pulse Resp SpO2 Height Weight   07/11/15 2012 -- -- 07/11/15 2012 07/11/15 2012 07/11/15 2012 -- --   138/66   64 16 96 %         Physical Exam    CONSTITUTIONAL: Awake, though lethargic. Cooperative. Well-developed. Well-nourished. Non-toxic. No acute distress.  HENT: Normocephalic. Atraumatic. External ears normal, without discharge. No nasal discharge. Oropharynx clear. Mucous membranes moist.  EYES: Conjunctiva non-injected. No scleral icterus. PERRL. EOM's grossly intact.    NECK: Supple. Normal ROM.   CARDIOVASCULAR: RRR. No Murmer. Intact distal  pulses.  PULMONARY/CHEST WALL: Effort normal. No tachypnea. Lungs clear to ausculation.   ABDOMEN: Soft. Nondistended. No tenderness to palpate. No guarding.  GU/ANORECTAL: Not assessed  MUSKULOSKELETAL: Normal ROM. No acute deformities. No edema. No tenderness to palpate.  SKIN: Warm and dry.    NEUROLOGICAL: Awake, but lethargic and post-ictal. CN II-XII grossly intact. Patient moves all extremities. Normal gait (observed on prior ED visit, just prior to repeat visit). DTRs normal and symmetric.  PSYCHIATRIC: Normal affect        DIAGNOSTIC RESULTS:     LABS:    Results for orders placed or performed during the hospital encounter of 07/11/15   CBC Auto Differential   Result Value Ref Range    WBC 5.7 4.0 - 11.0 K/uL    RBC 4.74 4.20 - 5.90 M/uL    Hemoglobin 14.2 13.5 - 17.5 g/dL    Hematocrit 16.1 09.6 - 52.5 %    MCV 87.8 80.0 - 100.0 fL    MCH 29.9 26.0 - 34.0 pg    MCHC 34.1 31.0 - 36.0 g/dL    RDW 04.5 40.9 - 81.1 %    Platelets 243 135 - 450 K/uL    MPV 7.5 5.0 - 10.5 fL    Neutrophils % 75.4 %    Lymphocytes Relative 17.2 %    Monocytes % 4.8 %    Eosinophils Relative Percent 2.0 %    Basophils % 0.6 %    Neutrophils # 4.3 1.7 - 7.7 K/uL    Lymphocytes # 1.0 1.0 - 5.1 K/uL    Monocytes # 0.3 0.0 - 1.3 K/uL    Eosinophils # 0.1 0.0 - 0.6 K/uL    Basophils # 0.0 0.0 - 0.2 K/uL   Lactic acid, plasma   Result Value Ref Range    Lactic Acid 1.3 0.4 - 2.0  mmol/L   Levetiracetam level   Result Value Ref Range    KEPPRA Dose Amt Unknown    SPECIMEN REJECTION   Result Value Ref Range    Rejected Test CHems     Reason for Rejection see below    Comprehensive Metabolic Panel   Result Value Ref Range    Sodium 136 136 - 145 mmol/L    Potassium 4.2 3.5 - 5.1 mmol/L    Chloride 97 (L) 99 - 110 mmol/L    CO2 27 21 - 32 mmol/L    Anion Gap 12 3 - 16    Glucose 119 (H) 70 - 99 mg/dL    BUN 8 7 - 20 mg/dL    CREATININE 0.9 0.9 - 1.3 mg/dL    GFR Non-African American >60 >60    GFR African American >60 >60    Calcium 9.0 8.3  - 10.6 mg/dL    Total Protein 7.5 6.4 - 8.2 g/dL    Alb 4.4 3.4 - 5.0 g/dL    Albumin/Globulin Ratio 1.4 1.1 - 2.2    Total Bilirubin <0.2 0.0 - 1.0 mg/dL    Alkaline Phosphatase 81 40 - 129 U/L    ALT 8 (L) 10 - 40 U/L    AST 16 15 - 37 U/L    Globulin 3.1 g/dL   CK   Result Value Ref Range    Total CK 237 39 - 308 U/L   Ethanol   Result Value Ref Range    Ethanol Lvl None Detected mg/dL         RADIOLOGY:  All x-ray studies are viewed/reviewed by me.  Formal interpretations per the radiologist are as follows:    Ct Head Wo Contrast    Result Date: 07/11/2015  EXAMINATION: CT OF THE HEAD WITHOUT CONTRAST  07/11/2015 9:40 pm TECHNIQUE: CT of the head was performed without the administration of intravenous contrast. Dose modulation, iterative reconstruction, and/or weight based adjustment of the mA/kV was utilized to reduce the radiation dose to as low as reasonably achievable. COMPARISON: 09/15/2013 HISTORY: ORDERING SYSTEM PROVIDED HISTORY: seizure Initial evaluation. FINDINGS: BRAIN/VENTRICLES: Bifrontal encephalomalacia, left greater than right. Anterior left temporal lobe encephalomalacia is also noted.  No evidence of mass effect or midline shift.  No evidence of hydrocephalus.  No gross acute hemorrhage.  Gray matter white matter differentiation is preserved. ORBITS: The visualized portion of the orbits demonstrate no acute abnormality. SINUSES: The visualized paranasal sinuses and mastoid air cells demonstrate no acute abnormality. SOFT TISSUES/SKULL:  Soft tissue swelling is seen of the posterior scalp near the vertex.     Bifrontal and left temporal lobe encephalomalacia.  No gross acute process. Soft tissue swelling of the posterior scalp near the vertex.       PROCEDURES:   N/A    CRITICAL CARE TIME:   N/A    CONSULTS:  IP CONSULT TO HOSPITALIST  IP CONSULT TO NEUROLOGY      EMERGENCY DEPARTMENT COURSE and DIFFERENTIAL DIAGNOSIS/MDM:   Vitals:    Vitals:    07/11/15 2121 07/11/15 2141 07/11/15 2147 07/11/15  2202   BP:  124/62 114/61 (!) 106/54   Pulse:  75 70 70   Resp:  16 17 17    SpO2: 97% 98% 96% 95%       Patient was given the following medications:  Medications   levETIRAcetam (KEPPRA) injection 1,500 mg (not administered)   0.9 % sodium chloride bolus (1,000 mLs Intravenous New Bag 07/11/15 2106)  LORazepam (ATIVAN) injection 2 mg (2 mg Intravenous Given 07/11/15 2106)   levetiracetam (KEPPRA) 500 mg/100 mL IVPB (0 mg Intravenous Stopped 07/11/15 2146)         Patient was evaluated by both myself and [name], MD. The patient is here after having multiple seizures today.  One occurred prior to arrival and he has had 2 more in the department.  He is given Ativan 2 mg IV and Keppra 1 g orally on his first visit and then 2 more g IV on his second visit. (This total of 3 g given was discussed with neurology who recommended it and reported they would check a Keppra level tomorrow). His labs are stable and CT imaging of the head is as well.  Still, with persistent seizures the patient will be admitted and we will consult neurology.  The patient has remained hemodynamically stable in the ED.    I spoke with Dr. Glean Hess (neuro) and consulted the hospitalist. We thoroughly discussed the history, physical exam, laboratory and imaging studies, as well as, emergency department course. Based upon that discussion, we've decided to admit Roy Weaver for further observation and evaluation of Roy Weaver's mental status change.  As I have deemed necessary from their history, physical and studies, I have considered and evaluated Roy Weaver for the following diagnoses:  SEPSIS, MENINGITIS, DIABETIC COMPLICATIONS, INTRACRANIAL HEMORRHAGE, SUBARACHNOID HEMORRHAGE, SUBDURAL HEMATOMA, & STROKE.    FINAL IMPRESSION:      1. Seizures (HCC)          DISPOSITION/PLAN:   DISPOSITION Decision To Admit                   (Please note that portions of this note were completed with a voice recognition program.  Efforts were  made to edit the dictations, but occasionally words are mis-transcribed.)    [name], PA-C (electronically signed)             [name], PA  07/11/15 2220

## 2015-07-12 ENCOUNTER — Inpatient Hospital Stay: Admit: 2015-07-12 | Primary: Internal Medicine

## 2015-07-12 ENCOUNTER — Inpatient Hospital Stay
Admission: EM | Admit: 2015-07-12 | Discharge: 2015-07-12 | Disposition: A | Discharge status: Home / Self Care | Payer: PRIVATE HEALTH INSURANCE | Source: Home / Self Care | Admitting: Nephrology

## 2015-07-12 LAB — COMPREHENSIVE METABOLIC PANEL
ALT: 8 U/L — ABNORMAL LOW (ref 10–40)
AST: 16 U/L (ref 15–37)
Albumin/Globulin Ratio: 1.4 (ref 1.1–2.2)
Albumin: 4.4 g/dL (ref 3.4–5.0)
Alkaline Phosphatase: 81 U/L (ref 40–129)
Anion Gap: 12 (ref 3–16)
BUN: 8 mg/dL (ref 7–20)
CO2: 27 mmol/L (ref 21–32)
Calcium: 9 mg/dL (ref 8.3–10.6)
Chloride: 97 mmol/L — ABNORMAL LOW (ref 99–110)
Creatinine: 0.9 mg/dL (ref 0.9–1.3)
GFR African American: >60 (ref >60)
GFR Non-African American: >60 (ref >60)
Globulin: 3.1 g/dL
Glucose: 119 mg/dL — ABNORMAL HIGH (ref 70–99)
Potassium: 4.2 mmol/L (ref 3.5–5.1)
Sodium: 136 mmol/L (ref 136–145)
Total Bilirubin: <0.2 mg/dL (ref 0.0–1.0)
Total Protein: 7.5 g/dL (ref 6.4–8.2)

## 2015-07-12 LAB — CBC WITH AUTO DIFFERENTIAL
Basophils %: 0.6 %
Basophils Absolute: 0 10*3/uL (ref 0.0–0.2)
Eosinophils %: 2 %
Eosinophils Absolute: 0.1 10*3/uL (ref 0.0–0.6)
Hematocrit: 41.6 % (ref 40.5–52.5)
Hemoglobin: 14.2 g/dL (ref 13.5–17.5)
Lymphocytes %: 17.2 %
Lymphocytes Absolute: 1 10*3/uL (ref 1.0–5.1)
MCH: 29.9 pg (ref 26.0–34.0)
MCHC: 34.1 g/dL (ref 31.0–36.0)
MCV: 87.8 fL (ref 80.0–100.0)
MPV: 7.5 fL (ref 5.0–10.5)
Monocytes %: 4.8 %
Monocytes Absolute: 0.3 10*3/uL (ref 0.0–1.3)
Neutrophils %: 75.4 %
Neutrophils Absolute: 4.3 10*3/uL (ref 1.7–7.7)
Platelets: 243 10*3/uL (ref 135–450)
RBC: 4.74 M/uL (ref 4.20–5.90)
RDW: 12.9 % (ref 12.4–15.4)
WBC: 5.7 10*3/uL (ref 4.0–11.0)

## 2015-07-12 LAB — BASIC METABOLIC PANEL
Anion Gap: 11 (ref 3–16)
BUN: 7 mg/dL (ref 7–20)
CO2: 25 mmol/L (ref 21–32)
Calcium: 9 mg/dL (ref 8.3–10.6)
Chloride: 103 mmol/L (ref 99–110)
Creatinine: 0.8 mg/dL — ABNORMAL LOW (ref 0.9–1.3)
GFR African American: >60 (ref >60)
GFR Non-African American: >60 (ref >60)
Glucose: 102 mg/dL — ABNORMAL HIGH (ref 70–99)
Potassium: 4.3 mmol/L (ref 3.5–5.1)
Sodium: 139 mmol/L (ref 136–145)

## 2015-07-12 LAB — URINE DRUG SCREEN
Amphetamine Screen, Urine: NEGATIVE
Barbiturate Screen, Ur: NEGATIVE (ref <200)
Benzodiazepine Screen, Urine: NEGATIVE (ref <200)
Cannabinoid Scrn, Ur: POSITIVE — AB (ref <50)
Cocaine Metabolite Screen, Urine: NEGATIVE (ref <300)
Methadone Screen, Urine: NEGATIVE (ref <300)
Opiate Scrn, Ur: POSITIVE — AB (ref <300)
Oxycodone Urine: NEGATIVE (ref <100)
PCP Screen, Urine: NEGATIVE (ref <25)
Propoxyphene Scrn, Ur: NEGATIVE (ref <300)
pH, UA: 7

## 2015-07-12 LAB — MAGNESIUM: Magnesium: 2.3 mg/dL (ref 1.80–2.40)

## 2015-07-12 LAB — CBC
Hematocrit: 40.9 % (ref 40.5–52.5)
Hemoglobin: 14 g/dL (ref 13.5–17.5)
MCH: 30.2 pg (ref 26.0–34.0)
MCHC: 34.1 g/dL (ref 31.0–36.0)
MCV: 88.7 fL (ref 80.0–100.0)
MPV: 7.4 fL (ref 5.0–10.5)
Platelets: 240 10*3/uL (ref 135–450)
RBC: 4.61 M/uL (ref 4.20–5.90)
RDW: 12.6 % (ref 12.4–15.4)
WBC: 7.7 10*3/uL (ref 4.0–11.0)

## 2015-07-12 LAB — CK: Total CK: 237 U/L (ref 39–308)

## 2015-07-12 LAB — SPECIMEN REJECTION

## 2015-07-12 LAB — LACTIC ACID: Lactic Acid: 1.3 mmol/L (ref 0.4–2.0)

## 2015-07-12 LAB — LEVETIRACETAM LEVEL: Levetiracetam Lvl: <2 ug/mL — ABNORMAL LOW (ref 6.0–46.0)

## 2015-07-12 LAB — ETHANOL: Ethanol Lvl: NOT DETECTED mg/dL

## 2015-07-12 MED ORDER — ACETAMINOPHEN 325 MG PO TABS
325 MG | ORAL | Status: DC | PRN
Start: 2015-07-12 — End: 2015-07-12

## 2015-07-12 MED ORDER — NORMAL SALINE FLUSH 0.9 % IV SOLN
0.9 % | Freq: Two times a day (BID) | INTRAVENOUS | Status: DC
Start: 2015-07-12 — End: 2015-07-12

## 2015-07-12 MED ORDER — NORMAL SALINE FLUSH 0.9 % IV SOLN
0.9 % | INTRAVENOUS | Status: DC | PRN
Start: 2015-07-12 — End: 2015-07-12

## 2015-07-12 MED ORDER — LEVETIRACETAM 500 MG PO TABS
500 MG | Freq: Two times a day (BID) | ORAL | Status: DC
Start: 2015-07-12 — End: 2015-07-12
  Administered 2015-07-12: 15:00:00 1000 mg via ORAL

## 2015-07-12 MED ORDER — LEVETIRACETAM 500 MG PO TABS
500 MG | Freq: Once | ORAL | Status: AC
Start: 2015-07-12 — End: 2015-07-11
  Administered 2015-07-12: 01:00:00 1000 mg via ORAL

## 2015-07-12 MED ORDER — ONDANSETRON HCL 4 MG/2ML IJ SOLN
4 MG/2ML | Freq: Four times a day (QID) | INTRAMUSCULAR | Status: DC | PRN
Start: 2015-07-12 — End: 2015-07-12

## 2015-07-12 MED ORDER — LEVETIRACETAM 500 MG/5ML IV SOLN
5005 MG/5ML | Freq: Once | INTRAVENOUS | Status: AC
Start: 2015-07-12 — End: 2015-07-12
  Administered 2015-07-12: 04:00:00 1500 mg via INTRAVENOUS

## 2015-07-12 MED ORDER — SODIUM CHLORIDE 0.9 % IV SOLN
0.9 % | Freq: Once | INTRAVENOUS | Status: DC
Start: 2015-07-12 — End: 2015-07-11

## 2015-07-12 MED ORDER — LORAZEPAM 2 MG/ML IJ SOLN
2 MG/ML | INTRAMUSCULAR | Status: DC | PRN
Start: 2015-07-12 — End: 2015-07-12

## 2015-07-12 MED ORDER — SODIUM CHLORIDE 0.9 % IV BOLUS
0.9 % | Freq: Once | INTRAVENOUS | Status: AC
Start: 2015-07-12 — End: 2015-07-11
  Administered 2015-07-12: 02:00:00 1000 mL via INTRAVENOUS

## 2015-07-12 MED ORDER — LEVETIRACETAM IN NACL 500 MG/100ML IV SOLN
500 MG/100ML | Freq: Once | INTRAVENOUS | Status: AC
Start: 2015-07-12 — End: 2015-07-11
  Administered 2015-07-12: 02:00:00 500 mg via INTRAVENOUS

## 2015-07-12 MED ORDER — LEVETIRACETAM IN NACL 1000 MG/100ML IV SOLN
1000 MG/100ML | Freq: Once | INTRAVENOUS | Status: DC
Start: 2015-07-12 — End: 2015-07-11

## 2015-07-12 MED ORDER — MAGNESIUM HYDROXIDE 400 MG/5ML PO SUSP
400 MG/5ML | Freq: Every day | ORAL | Status: DC | PRN
Start: 2015-07-12 — End: 2015-07-12

## 2015-07-12 MED ORDER — LEVETIRACETAM 500 MG/5ML IV SOLN
500 MG/5ML | Freq: Once | INTRAVENOUS | Status: DC
Start: 2015-07-12 — End: 2015-07-11

## 2015-07-12 MED ORDER — LORAZEPAM 2 MG/ML IJ SOLN
2 MG/ML | Freq: Once | INTRAMUSCULAR | Status: AC
Start: 2015-07-12 — End: 2015-07-11
  Administered 2015-07-12: 02:00:00 2 mg via INTRAVENOUS

## 2015-07-12 MED ORDER — ENOXAPARIN SODIUM 40 MG/0.4ML SC SOLN
40 MG/0.4ML | Freq: Every day | SUBCUTANEOUS | Status: DC
Start: 2015-07-12 — End: 2015-07-12

## 2015-07-12 MED FILL — KEPPRA 500 MG/5ML IV SOLN: 500 MG/5ML | INTRAVENOUS | Qty: 15

## 2015-07-12 MED FILL — SODIUM CHLORIDE 0.9 % IV SOLN: 0.9 % | INTRAVENOUS | Qty: 1000

## 2015-07-12 MED FILL — LOVENOX 40 MG/0.4ML SC SOLN: 40 MG/0.4ML | SUBCUTANEOUS | Qty: 0.4

## 2015-07-12 MED FILL — LEVETIRACETAM IN NACL 500 MG/100ML IV SOLN: 500 MG/100ML | INTRAVENOUS | Qty: 100

## 2015-07-12 MED FILL — NORMAL SALINE FLUSH 0.9 % IV SOLN: 0.9 % | INTRAVENOUS | Qty: 10

## 2015-07-12 MED FILL — LEVETIRACETAM 500 MG PO TABS: 500 MG | ORAL | Qty: 2

## 2015-07-12 MED FILL — LORAZEPAM 2 MG/ML IJ SOLN: 2 MG/ML | INTRAMUSCULAR | Qty: 1

## 2015-07-12 NOTE — Progress Notes (Signed)
07/12/15-  Pt states to RN, that he can't find his cell phone, and eye glasses, he thinks he left them down in ED.  ED called by RN [employer]) and they will look for those items.  ED called back later and they have his eye glasses and earrings, but no phone.  RN- [employer] called security and they didn't get a phone turned in from ED.  Notified pt of our findings

## 2015-07-12 NOTE — Discharge Summary (Signed)
[facility] DISCHARGE SUMMARY    Patient Demographics    Patient. Roy Weaver  Date of Birth. 23-Mar-1986  MRN. [id number]     Primary care provider. Baldwin Jamaica, MD  (Tel: [phone])    Admit date: 07/11/2015    Discharge date (blank if same as Note Date):   Note Date: 07/12/2015     Reason for Hospitalization.   Chief Complaint   Patient presents with   ??? Seizures     Pt was here being seen for a seizure earlier, pt did not want to be worked up and refused treatment so he was discharged. Pt was sitting in waiting room and had a witnessed seizure by staff. Pt was caught by employee, pt was shaking and foaming at the mouth. Pt back in room post ictal.         Significant Findings.   Principal Problem:    Recurrent seizures (HCC)  Active Problems:    Post-ictal state (HCC)    Breakthrough seizure (HCC)       Problems and results from this hospitalization that need follow up.  1. None    Significant test results and incidental findings.  1.     Invasive procedures and treatments.   1. None     Problem-based Hospital Course.  30 y.o. male with a history of partial complex seizures with numerous ER visits and hospitalizations for recurrent seizures due to noncompliance with his Keppra along with cocaine and cannabis positive urine tox in past. He presented earlier today to the ER s/p seizure at home. He was evaluated by neurologist . He was continued on 1000 mg Keppra BID. He did not have any seizures during his hospitalizations. CT head did not reveal any acute changes  He will follow up with the neurologist outpatient    Consults.  IP CONSULT TO HOSPITALIST  IP CONSULT TO NEUROLOGY  IP CONSULT TO NEUROLOGY    Physical examination on discharge day.   Visit Vitals   ??? BP 103/61   ??? Pulse 85   ??? Temp 98.1 ??F (36.7 ??C) (Oral)   ??? Resp 16   ??? Ht  (1.93 m)   ??? Wt 179 lb (81.2 kg)   ??? SpO2 96%   ??? BMI 21.79  kg/m2     General appearance.  Alert. Looks comfortable.  HEENT. Sclera clear. Moist mucus membranes.  Cardiovascular. Regular rate and rhythm, normal S1, S2. No murmur.   Respiratory. Not using accessory muscles.Clear to auscultation bilaterally, no wheeze.  Gastrointestinal. Abdomen soft, non-tender, not distended, normal bowel sounds  Neurology. Facial symmetry. No speech deficits. Moving all extremities equally.  Extremities. No edema in lower extremities.  Skin. Warm, dry, normal turgor    Medication instructions provided to patient at discharge.     Medication List      CONTINUE taking these medications          levETIRAcetam 1000 MG tablet   Commonly known as:  KEPPRA   Take 1 tablet by mouth 2 times daily             Discharge recommendations given to patient.  Follow Up. Neurologist in 1 week   Disposition.  home  Activity. no driving   Diet: Diet NPO Effective Now Exceptions are: Sips with Meds      Spent > 30 minutes in discharge process.    Signed:  Curley Spice, MD     07/12/2015 3:27 PM

## 2015-07-12 NOTE — Progress Notes (Signed)
Pt admitted to floor, VS stable. Pt lethargic. Suction placed at bedside, oxygen at bedside. Seizure pads in place. Urinal at bedside to collect urine sample when able. Pt responds to verbal stimuli and touch. Fall precautions in place for safety.

## 2015-07-12 NOTE — Consults (Signed)
NEUROLOGY CONSULTATION     Patient's Name :   Roy Weaver        Date of birth:   Dec 03, 1985                    Date of Consultation : 07/12/2015 11:47 AM    Referring Physician :  Curley Spice, MD    Reason for Consultation :    Intractable seizures    HISTORY OF PRESENT ILLNESS :    Roy Weaver is a 30 y.o. male   History was obtained from patient and dictations in the chart.  Patient is well-known to me.  He has known partial, seizures.  He has had a history of noncompliance with his medication.  He has been admitted to the hospital several times with breakthrough seizures and increased time his Keppra level would be undetectable.  Urine drug screen in the past have been positive for cocaine as well.  This time patient came with a seizure to the hospital and initially refused to be evaluated.  He was given some additional Keppra and sent home but in the waiting room he had another generalized tonic-clonic seizure.  He was brought back to the hospital and was readmitted back to the hospital.  This time as well his Keppra level was found to be less than 2 even though patient states he is taking it regularly.  He is supposed to be taking Keppra 1000 mg twice daily.  His urine drug screen this time was positive for opiates and cannabinoids  Patient denies any focal weakness numbness vertigo or diplopia.    REVIEW OF SYSTEMS    Denies any chest pain palpitations breathlessness abdominal pain fever or weight loss.  Rest of the 10 system review was reviewed and was negative except for the symptoms noted above.  Past Medical History   Diagnosis Date   ??? Migraine    ??? Psychiatric problem    ??? Seizures (HCC)      Family History   Problem Relation Age of Onset   ??? Diabetes Father      Social History     Social History   ??? Marital status: Single     Spouse name: N/A   ??? Number of children: 0   ??? Years of education: 62     Social History Main Topics    ??? Smoking status: Current Some Day Smoker     Packs/day: 1.00     Years: 2.00     Types: Cigarettes   ??? Smokeless tobacco: Former Neurosurgeon     Quit date: 10/04/2013   ??? Alcohol use 0.0 oz/week     0 Standard drinks or equivalent per week      Comment: occasioanly   ??? Drug use: Yes     Special: Marijuana      Comment: smokes once every 3 weeks   ??? Sexual activity: Not Asked     Other Topics Concern   ??? None     Social History Narrative    ** Merged History Encounter **          Current Facility-Administered Medications   Medication Dose Route Frequency Provider Last Rate Last Dose   ??? levETIRAcetam (KEPPRA) tablet 1,000 mg  1,000 mg Oral BID Aline August, NP   1,000 mg at 07/12/15 2956   ??? sodium chloride flush 0.9 % injection 10 mL  10 mL Intravenous 2 times per day Aline August, NP       ???  sodium chloride flush 0.9 % injection 10 mL  10 mL Intravenous PRN Aline August, NP       ??? acetaminophen (TYLENOL) tablet 650 mg  650 mg Oral Q4H PRN Aline August, NP       ??? magnesium hydroxide (MILK OF MAGNESIA) 400 MG/5ML suspension 30 mL  30 mL Oral Daily PRN Aline August, NP       ??? ondansetron (ZOFRAN) injection 4 mg  4 mg Intravenous Q6H PRN Aline August, NP       ??? enoxaparin (LOVENOX) injection 40 mg  40 mg Subcutaneous Daily Aline August, NP   40 mg at 07/12/15 0933   ??? LORazepam (ATIVAN) injection 2 mg  2 mg Intravenous Q4H PRN Aline August, NP         No Known Allergies    PHYSICAL EXAMINATION:  Visit Vitals   ??? BP 103/61   ??? Pulse 85   ??? Temp 98.1 ??F (36.7 ??C) (Oral)   ??? Resp 16   ??? Ht  (1.93 m)   ??? Wt 179 lb (81.2 kg)   ??? SpO2 96%   ??? BMI 21.79 kg/m2     Appearance: Well appearing, well nourished and in no distress  Mental Status Exam: Patient is alert, oriented to person, place and time.   Recent and remote memory is normal  Fund of Knowledge is normal  Attention/concentration is normal.   Speech : No dysarthria  Language : No aphasia  Funduscopic Exam: sharp disc margins  Cranial Nerves:   II: Visual fields:  Full to  confrontation  III: Pupils:  equal, round, reactive to light  III,IV,VI: Extra Ocular Movements are intact. No nystagmus  V: Facial sensation is intact to pin prick and light touch  VII: Facial strength and movements: intact and symmetric smile,cheek puffing and eyebrow elevation  VIII: Hearing:  Intact to finger rub bilaterally  IX: Palate  elevation is symmetric  XI: Shoulder shrug is intact  XII: Tongue movements are normal  Motor:  Muscle tone and bulk are normal.   Strength is symmetrical 5/5 in all four extremities.  Sensory: Intact to light touch and  pin prick in all four extremities  Coordination:  Normal  Finger to Nose and Heel to Shin bilaterally    .Reflexes:  DTR +2 and symmetric bilaterally  Plantar response: Flexor bilaterally  Gait: Gait and station is normal. Patient can toe/ heel and tandem walk without difficulty  Romberg: negative  Vascular: No carotid bruit bilaterally      DATA:  LABS:  General Labs:    CBC:   Lab Results   Component Value Date    WBC 7.7 07/12/2015    RBC 4.61 07/12/2015    HGB 14.0 07/12/2015    HCT 40.9 07/12/2015    MCV 88.7 07/12/2015    MCH 30.2 07/12/2015    MCHC 34.1 07/12/2015    RDW 12.6 07/12/2015    PLT 240 07/12/2015    MPV 7.4 07/12/2015     BMP:    Lab Results   Component Value Date    NA 139 07/12/2015    K 4.3 07/12/2015    CL 103 07/12/2015    CO2 25 07/12/2015    BUN 7 07/12/2015    LABALBU 4.4 07/11/2015    CREATININE 0.8 07/12/2015    CREATININE 1.0 08/17/2011    CALCIUM 9.0 07/12/2015    GFRAA >60 07/12/2015    GFRAA 157  08/19/2011    LABGLOM >60 07/12/2015    GLUCOSE 102 07/12/2015     RADIOLOGY REVIEW:  I have reviewed radiology image(s) and reports(s) of:  CT scan of the head    IMPRESSION :  Intractable partial complex seizures  Breakthrough seizure probably due to noncompliance  Keppra level was undetectable or less than 2 in the blood  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 Seizures (HCC)   ??? Migraine   ??? Recurrent seizures (HCC)   ??? History of nonadherence to medical treatment   ??? Delirium   ??? Drug use   ??? Partial epilepsy with impairment of consciousness (HCC)   ??? Seizures (HCC)   ??? Partial epilepsy with impairment of consciousness, intractable (HCC)   ??? Noncompliance with medication regimen   ??? Aspiration pneumonia of right lung (HCC)   ??? 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 Seizure (HCC)   ??? Visit for suture removal   ??? Post-ictal state (HCC)     RECOMMENDATIONS :  Lengthy discussion with the patient  Explained the need to take medications regularly  Continue Keppra 1000 mg twice daily  Keppra level to be done tomorrow morning  Explained to patient that he should not be driving an automobile at least for the 6 months after the last seizure  Thank you for this consultation        Please note a portion of this chart was generated using dragon dictation software. Although every effort was made to ensure the accuracy of this automated transcription, some errors in transcription may have occurred.

## 2015-07-12 NOTE — Plan of Care (Signed)
Problem: SAFETY  Goal: Patient will remain free of physical injury  Outcome: Ongoing  Seizure precautions in place. Call light and bedside table within reach for safety.

## 2015-07-12 NOTE — Plan of Care (Signed)
Problem: Falls - Risk of:  Intervention: Manage a safe environment  Pt states he can just fall without any notice, like walking over to a friend's house.  Reviewed proper medication directions and F/U with Dr for proper therapeutic blood level achieved

## 2015-07-12 NOTE — Progress Notes (Signed)
07/12/15-  Cleared for discharge by Dr Glean Hess and Dr Raleigh Callas.  Pt to have labwork done and go back on Keppra twice daily.  Return appts with Dr Zola Button and Dr Glean Hess in 7-10 days

## 2015-07-12 NOTE — Progress Notes (Signed)
07/12/15-  Pt assessment complete, charted in doc flowsheet.  Pt verbalizes not taking medication as directed and not getting prescriptions filled.  I asked if he needed help affording these medcications and or getting to pharmacy to pick up prescriptions and he replied "No, I just don't take it everyday." Reviewed the importance of following up with the neurologist, being compliant with meds, being compliant with labwork and follow up visits.  He didn't agree that he would be better with this but maybe he will.

## 2015-07-14 NOTE — Care Coordination-Inpatient (Signed)
Transitional Care Management Service-  Initial Post-discharge Communication    Spoke with:     Attempted 1st outreach for Hospital Transition follow up call. No answer, left msg requesting returned call from patient.  Will attempt outreach again on 2.21.17.    Date of discharge: 2.18.17    30 day Re-Admission:No  Reason for Hospital Visit: Recurrent Seizures  Facility: [facility]  Discharged with Home Care Services- N/A    Non-face-to-face services provided:  Scheduled appointment with PCP- If patient is reached, will offer to help patient in scheduling a HFU appt with their PCP within 7-10 days of discharge.   Scheduled appointment with Specialist- Patient needs to schedule HFU appt with Dr. Glean Hess (Neurology).    Obtained and reviewed discharge summary and/or continuity of care documents.  Additional Notes:   ?? Patient with frequent ED visits r/t Seizures  ?? Patient originally presented to ED on 2.17.17 d/t Seizure but didn't want to stay for full evaluation.  Upon leaving ED patient had a witnessed generalized Tonic Clonic Seizure.   ?? Neurology notes indicate that patient is non-compliant with seizure medication (Keppra  BID), and has hx of Drug Abuse (opiates, cocaine, and marijuana).  ?? Patient should not be driving for at least 6 months after the last seizure.        New Medications?:   No  ?? New Rx: n/a.  ?? Changed Rx: n/a.  ?? Stopped RX: n/a.        Future Appointments  Date Time Provider Department Center   10/13/2015 10:30 AM Salvadore Farber, MD FF NEURO [facility]       Christiane Ha RN, BSN  [facility] Coordination Telephonic Nurse  [phone]

## 2015-07-15 NOTE — Care Coordination-Inpatient (Signed)
Transitional Care Management Service-  Initial Post-discharge Communication    Spoke with: Patient    Date of discharge: 2.18.17    30 day Re-Admission:No  Reason for Hospital Visit: Recurrent Seizures  Facility: [facility]  Discharged with Home Care Services- N/A    Non-face-to-face services provided:  Scheduled appointment with PCP-  Patient declined need for this RN to help with scheduling HFU appt.  Patient educated on the benefits of HFU appts and encouraged to schedule appt within 7-14 days of discharge.  Patient verbalized understanding and agreeable to calling to schedule appt on own.   Scheduled appointment with Specialist- Patient needs to schedule HFU appt with Dr. Glean Hess (Neurology).  Patient reports that he will call to schedule appt on his own.     Obtained and reviewed discharge summary and/or continuity of care documents.  Additional Notes:   ?? Patient with frequent ED visits r/t Seizures  ?? Patient originally presented to ED on 2.17.17 d/t Seizure but didn't want to stay for full evaluation.  Upon leaving ED patient had a witnessed generalized Tonic Clonic Seizure.   ?? Neurology notes indicate that patient is non-compliant with seizure medication (Keppra  BID), and has hx of Drug Abuse (opiates, cocaine, and marijuana).  ?? Patient should not be driving for at least 6 months after the last seizure.      Did you receive a discharge summary with list of medication from the hospital? Yes  Review of Instructions:     Understands what to report/when to return?:  Yes   Understands discharge instructions?:  Yes   Following discharge instructions?:  Yes   If not why? N/a.    New Medications?:   No  ?? New Rx: n/a.  ?? Changed Rx: n/a.  ?? Stopped RX: n/a.      Understands Medications?  Yes  Taking Medications? Yes- patient reports he has the Keppra and is taking it BID. Discussed importance of medication compliance to help prevent further seizure activity.       Future  Appointments  Date Time Provider Department Center   10/13/2015 10:30 AM Salvadore Farber, MD FF NEURO [facility]     Christiane Ha RN, BSN  [facility] Coordination Telephonic Nurse  [phone]

## 2015-07-18 ENCOUNTER — Encounter: Admit: 2015-07-18 | Primary: Internal Medicine

## 2015-07-18 ENCOUNTER — Inpatient Hospital Stay
Admission: EM | Admit: 2015-07-18 | Discharge: 2015-07-29 | Disposition: A | Discharge status: Residential Facility | Payer: PRIVATE HEALTH INSURANCE | Source: Home / Self Care | Admitting: Internal Medicine

## 2015-07-18 ENCOUNTER — Encounter: Attending: Specialist | Primary: Internal Medicine

## 2015-07-18 ENCOUNTER — Inpatient Hospital Stay: Admit: 2015-07-19 | Primary: Internal Medicine

## 2015-07-18 DIAGNOSIS — T796XXA Traumatic ischemia of muscle, initial encounter: Secondary | ICD-10-CM

## 2015-07-18 LAB — RENAL FUNCTION PANEL
Albumin: 3.3 g/dL — ABNORMAL LOW (ref 3.4–5.0)
Anion Gap: 15 (ref 3–16)
BUN: 26 mg/dL — ABNORMAL HIGH (ref 7–20)
CO2: 21 mmol/L (ref 21–32)
Calcium: 7 mg/dL — ABNORMAL LOW (ref 8.3–10.6)
Chloride: 103 mmol/L (ref 99–110)
Creatinine: 3.1 mg/dL — ABNORMAL HIGH (ref 0.9–1.3)
GFR African American: 29 — AB (ref >60)
GFR Non-African American: 24 — AB (ref >60)
Glucose: 106 mg/dL — ABNORMAL HIGH (ref 70–99)
Phosphorus: 4.2 mg/dL (ref 2.5–4.9)
Potassium: 4.7 mmol/L (ref 3.5–5.1)
Sodium: 139 mmol/L (ref 136–145)

## 2015-07-18 LAB — CBC WITH AUTO DIFFERENTIAL
Basophils %: 0.1 %
Basophils Absolute: 0 10*3/uL (ref 0.0–0.2)
Eosinophils %: 0 %
Eosinophils Absolute: 0 10*3/uL (ref 0.0–0.6)
Hematocrit: 52 % (ref 40.5–52.5)
Hemoglobin: 17 g/dL (ref 13.5–17.5)
Lymphocytes %: 5.2 %
Lymphocytes Absolute: 0.9 10*3/uL — ABNORMAL LOW (ref 1.0–5.1)
MCH: 29.7 pg (ref 26.0–34.0)
MCHC: 32.6 g/dL (ref 31.0–36.0)
MCV: 91.1 fL (ref 80.0–100.0)
MPV: 7.9 fL (ref 5.0–10.5)
Monocytes %: 7.8 %
Monocytes Absolute: 1.4 10*3/uL — ABNORMAL HIGH (ref 0.0–1.3)
Neutrophils %: 86.9 %
Neutrophils Absolute: 15.3 10*3/uL — ABNORMAL HIGH (ref 1.7–7.7)
Platelets: 260 10*3/uL (ref 135–450)
RBC: 5.71 M/uL (ref 4.20–5.90)
RDW: 13.3 % (ref 12.4–15.4)
WBC: 17.6 10*3/uL — ABNORMAL HIGH (ref 4.0–11.0)

## 2015-07-18 LAB — ACETAMINOPHEN LEVEL: Acetaminophen Level: <15 ug/mL (ref 10–30)

## 2015-07-18 LAB — COMPREHENSIVE METABOLIC PANEL
ALT: 362 U/L — ABNORMAL HIGH (ref 10–40)
AST: 2101 U/L — ABNORMAL HIGH (ref 15–37)
Albumin/Globulin Ratio: 1.1 (ref 1.1–2.2)
Albumin: 4.2 g/dL (ref 3.4–5.0)
Alkaline Phosphatase: 100 U/L (ref 40–129)
Anion Gap: 24 — ABNORMAL HIGH (ref 3–16)
BUN: 22 mg/dL — ABNORMAL HIGH (ref 7–20)
CO2: 17 mmol/L — ABNORMAL LOW (ref 21–32)
Calcium: 7.1 mg/dL — ABNORMAL LOW (ref 8.3–10.6)
Chloride: 97 mmol/L — ABNORMAL LOW (ref 99–110)
Creatinine: 2.6 mg/dL — ABNORMAL HIGH (ref 0.9–1.3)
GFR African American: 36 — AB (ref >60)
GFR Non-African American: 29 — AB (ref >60)
Globulin: 4 g/dL
Glucose: 105 mg/dL — ABNORMAL HIGH (ref 70–99)
Potassium: 7 mmol/L (ref 3.5–5.1)
Sodium: 138 mmol/L (ref 136–145)
Total Bilirubin: 0.3 mg/dL (ref 0.0–1.0)
Total Protein: 8.2 g/dL (ref 6.4–8.2)

## 2015-07-18 LAB — URINALYSIS WITH REFLEX TO CULTURE
Bilirubin Urine: NEGATIVE
Glucose, Ur: NEGATIVE mg/dL
Ketones, Urine: NEGATIVE mg/dL
Leukocyte Esterase, Urine: NEGATIVE
Nitrite, Urine: NEGATIVE
Protein, UA: 100 mg/dL — AB
Specific Gravity, UA: 1.02 (ref 1.005–1.030)
Urobilinogen, Urine: 0.2 E.U./dL (ref <2.0)
pH, UA: 6 (ref 5.0–8.0)

## 2015-07-18 LAB — BASIC METABOLIC PANEL
Anion Gap: 17 — ABNORMAL HIGH (ref 3–16)
BUN: 24 mg/dL — ABNORMAL HIGH (ref 7–20)
CO2: 21 mmol/L (ref 21–32)
Calcium: 7.4 mg/dL — ABNORMAL LOW (ref 8.3–10.6)
Chloride: 99 mmol/L (ref 99–110)
Creatinine: 2.6 mg/dL — ABNORMAL HIGH (ref 0.9–1.3)
GFR African American: 36 — AB (ref >60)
GFR Non-African American: 29 — AB (ref >60)
Glucose: 103 mg/dL — ABNORMAL HIGH (ref 70–99)
Sodium: 137 mmol/L (ref 136–145)

## 2015-07-18 LAB — CK
Total CK: 269829 U/L — ABNORMAL HIGH (ref 39–308)
Total CK: 300560 U/L — ABNORMAL HIGH (ref 39–308)

## 2015-07-18 LAB — MICROSCOPIC URINALYSIS
Epithelial Cells, UA: 0 /HPF (ref 0–5)
Hyaline Casts, UA: 1 /HPF (ref 0–8)
RBC, UA: 2 /HPF (ref 0–4)
WBC, UA: 0 /HPF (ref 0–5)

## 2015-07-18 LAB — URINE DRUG SCREEN
Amphetamine Screen, Urine: NEGATIVE
Barbiturate Screen, Ur: NEGATIVE (ref <200)
Benzodiazepine Screen, Urine: NEGATIVE (ref <200)
Cannabinoid Scrn, Ur: POSITIVE — AB (ref <50)
Cocaine Metabolite Screen, Urine: NEGATIVE (ref <300)
Methadone Screen, Urine: NEGATIVE (ref <300)
Opiate Scrn, Ur: NEGATIVE (ref <300)
Oxycodone Urine: NEGATIVE (ref <100)
PCP Screen, Urine: NEGATIVE (ref <25)
Propoxyphene Scrn, Ur: NEGATIVE (ref <300)
pH, UA: 6

## 2015-07-18 LAB — SALICYLATE LEVEL: Salicylate, Serum: <0.3 mg/dL — ABNORMAL LOW (ref 15.0–30.0)

## 2015-07-18 LAB — OSMOLALITY: Osmolality: 304 mOsm/kg — ABNORMAL HIGH (ref 275–295)

## 2015-07-18 LAB — LEVETIRACETAM LEVEL: Levetiracetam Lvl: 14.5 ug/mL (ref 6.0–46.0)

## 2015-07-18 LAB — LACTIC ACID
Lactic Acid: 2.7 mmol/L — ABNORMAL HIGH (ref 0.4–2.0)
Lactic Acid: 2.6 mmol/L — ABNORMAL HIGH (ref 0.4–2.0)

## 2015-07-18 MED ORDER — VANCOMYCIN HCL 1000 MG IV SOLR
1000 MG | Freq: Once | INTRAVENOUS | Status: AC
Start: 2015-07-18 — End: 2015-07-18
  Administered 2015-07-18: 23:00:00 1500 mg via INTRAVENOUS

## 2015-07-18 MED ORDER — CALCIUM GLUCONATE 10 % IV SOLN
10 % | Freq: Once | INTRAVENOUS | Status: AC
Start: 2015-07-18 — End: 2015-07-18
  Administered 2015-07-18: 20:00:00 1 g via INTRAVENOUS

## 2015-07-18 MED ORDER — PROPOFOL 200 MG/20ML IV EMUL
200 | INTRAVENOUS | Status: AC
Start: 2015-07-18 — End: 2015-07-18

## 2015-07-18 MED ORDER — ONDANSETRON HCL 4 MG/2ML IJ SOLN
4 MG/2ML | Freq: Four times a day (QID) | INTRAMUSCULAR | Status: DC | PRN
Start: 2015-07-18 — End: 2015-07-19

## 2015-07-18 MED ORDER — ACETAMINOPHEN 325 MG PO TABS
325 MG | ORAL | Status: DC | PRN
Start: 2015-07-18 — End: 2015-07-19

## 2015-07-18 MED ORDER — NORMAL SALINE FLUSH 0.9 % IV SOLN
0.9 % | INTRAVENOUS | Status: DC | PRN
Start: 2015-07-18 — End: 2015-07-19

## 2015-07-18 MED ORDER — ONDANSETRON HCL 4 MG/2ML IJ SOLN
4 | INTRAMUSCULAR | Status: AC
Start: 2015-07-18 — End: 2015-07-18

## 2015-07-18 MED ORDER — LEVETIRACETAM 500 MG PO TABS
500 MG | Freq: Two times a day (BID) | ORAL | Status: DC
Start: 2015-07-18 — End: 2015-07-19
  Administered 2015-07-19 (×2): 1000 mg via ORAL

## 2015-07-18 MED ORDER — SODIUM CHLORIDE 0.45 % IV SOLN
0.45 % | INTRAVENOUS | Status: DC
Start: 2015-07-18 — End: 2015-07-21
  Administered 2015-07-18 – 2015-07-21 (×7): via INTRAVENOUS

## 2015-07-18 MED ORDER — SODIUM CHLORIDE 0.9 % IV BOLUS
0.9 % | Freq: Once | INTRAVENOUS | Status: AC
Start: 2015-07-18 — End: 2015-07-18
  Administered 2015-07-18: 19:00:00 1000 mL via INTRAVENOUS

## 2015-07-18 MED ORDER — MAGNESIUM HYDROXIDE 400 MG/5ML PO SUSP
400 MG/5ML | Freq: Every day | ORAL | Status: DC | PRN
Start: 2015-07-18 — End: 2015-07-19

## 2015-07-18 MED ORDER — SODIUM CHLORIDE 0.9 % IJ SOLN
0.9 | INTRAMUSCULAR | Status: AC
Start: 2015-07-18 — End: 2015-07-18

## 2015-07-18 MED ORDER — VANCOMYCIN INTERMITTENT DOSING (PLACEHOLDER)
INTRAVENOUS | Status: DC
Start: 2015-07-18 — End: 2015-07-19

## 2015-07-18 MED ORDER — ROCURONIUM BROMIDE 50 MG/5ML IV SOLN
50 | INTRAVENOUS | Status: AC
Start: 2015-07-18 — End: 2015-07-18

## 2015-07-18 MED ORDER — MIDAZOLAM HCL 2 MG/2ML IJ SOLN
2 | INTRAMUSCULAR | Status: AC
Start: 2015-07-18 — End: 2015-07-18

## 2015-07-18 MED ORDER — DEXTROSE 50 % IV SOLN
50 % | Freq: Once | INTRAVENOUS | Status: AC
Start: 2015-07-18 — End: 2015-07-18
  Administered 2015-07-18: 19:00:00 25 g via INTRAVENOUS

## 2015-07-18 MED ORDER — SODIUM POLYSTYRENE SULFONATE 15 GM/60ML PO SUSP
15 GM/60ML | Freq: Once | ORAL | Status: AC
Start: 2015-07-18 — End: 2015-07-18
  Administered 2015-07-18: 21:00:00 30 g via ORAL

## 2015-07-18 MED ORDER — FENTANYL CITRATE (PF) 100 MCG/2ML IJ SOLN
100 | INTRAMUSCULAR | Status: AC
Start: 2015-07-18 — End: 2015-07-18

## 2015-07-18 MED ORDER — NORMAL SALINE FLUSH 0.9 % IV SOLN
0.9 % | Freq: Two times a day (BID) | INTRAVENOUS | Status: DC
Start: 2015-07-18 — End: 2015-07-19
  Administered 2015-07-19 (×2): 10 mL via INTRAVENOUS

## 2015-07-18 MED ORDER — DEXTROSE 5 % IV SOLN (MINI-BAG)
5 % | Freq: Once | INTRAVENOUS | Status: DC
Start: 2015-07-18 — End: 2015-07-18

## 2015-07-18 MED ORDER — PHENYLEPHRINE HCL-NACL (PF) 1-0.9 MG/10ML-% IV SOSY
1-0.9 | INTRAVENOUS | Status: AC
Start: 2015-07-18 — End: 2015-07-18

## 2015-07-18 MED ORDER — CALCIUM GLUCONATE 10 % IV SOLN
10 % | Freq: Once | INTRAVENOUS | Status: DC
Start: 2015-07-18 — End: 2015-07-18

## 2015-07-18 MED ORDER — SODIUM BICARBONATE 8.4 % IV SOLN
8.4 % | INTRAVENOUS | Status: DC
Start: 2015-07-18 — End: 2015-07-18
  Administered 2015-07-18: 19:00:00 via INTRAVENOUS

## 2015-07-18 MED ORDER — INSULIN REGULAR HUMAN 100 UNIT/ML IJ SOLN
100 UNIT/ML | Freq: Once | INTRAMUSCULAR | Status: AC
Start: 2015-07-18 — End: 2015-07-18
  Administered 2015-07-18: 19:00:00 10 [IU] via INTRAVENOUS

## 2015-07-18 MED ORDER — LIDOCAINE HCL 2 % IJ SOLN
2 | INTRAMUSCULAR | Status: AC
Start: 2015-07-18 — End: 2015-07-18

## 2015-07-18 MED ORDER — SODIUM CHLORIDE 0.9 % IV SOLN (MINI-BAG)
0.9 % | Freq: Four times a day (QID) | INTRAVENOUS | Status: DC
Start: 2015-07-18 — End: 2015-07-21
  Administered 2015-07-18 – 2015-07-21 (×13): 1.5 g via INTRAVENOUS

## 2015-07-18 MED ORDER — HEPARIN SODIUM (PORCINE) 5000 UNIT/ML IJ SOLN
5000 UNIT/ML | Freq: Three times a day (TID) | INTRAMUSCULAR | Status: DC
Start: 2015-07-18 — End: 2015-07-19
  Administered 2015-07-19 (×2): 5000 [IU] via SUBCUTANEOUS

## 2015-07-18 MED ORDER — SODIUM CHLORIDE 0.9 % IV SOLN
0.9 % | INTRAVENOUS | Status: DC
Start: 2015-07-18 — End: 2015-07-18
  Administered 2015-07-18: 20:00:00 via INTRAVENOUS

## 2015-07-18 MED ORDER — SODIUM BICARBONATE 8.4 % IV SOLN
8.4 % | Freq: Once | INTRAVENOUS | Status: AC
Start: 2015-07-18 — End: 2015-07-18
  Administered 2015-07-18: 19:00:00 50 meq via INTRAVENOUS

## 2015-07-18 MED FILL — SODIUM CHLORIDE 0.9 % IV SOLN: 0.9 % | INTRAVENOUS | Qty: 1000

## 2015-07-18 MED FILL — CALCIUM GLUCONATE 10 % IV SOLN: 10 % | INTRAVENOUS | Qty: 10

## 2015-07-18 MED FILL — ONDANSETRON HCL 4 MG/2ML IJ SOLN: 4 MG/2ML | INTRAMUSCULAR | Qty: 2

## 2015-07-18 MED FILL — SODIUM CHLORIDE 0.45 % IV SOLN: 0.45 % | INTRAVENOUS | Qty: 1000

## 2015-07-18 MED FILL — MIDAZOLAM HCL 2 MG/2ML IJ SOLN: 2 MG/ML | INTRAMUSCULAR | Qty: 2

## 2015-07-18 MED FILL — DEXTROSE 50 % IV SOLN: 50 % | INTRAVENOUS | Qty: 50

## 2015-07-18 MED FILL — VANCOMYCIN HCL 1000 MG IV SOLR: 1000 MG | INTRAVENOUS | Qty: 1500

## 2015-07-18 MED FILL — PHENYLEPHRINE HCL-NACL (PF) 1-0.9 MG/10ML-% IV SOSY: INTRAVENOUS | Qty: 10

## 2015-07-18 MED FILL — HUMULIN R 100 UNIT/ML IJ SOLN: 100 UNIT/ML | INTRAMUSCULAR | Qty: 10

## 2015-07-18 MED FILL — SODIUM POLYSTYRENE SULFONATE 15 GM/60ML PO SUSP: 15 GM/60ML | ORAL | Qty: 120

## 2015-07-18 MED FILL — SODIUM BICARBONATE 8.4 % IV SOLN: 8.4 % | INTRAVENOUS | Qty: 50

## 2015-07-18 MED FILL — SODIUM CHLORIDE 0.9 % IJ SOLN: 0.9 % | INTRAMUSCULAR | Qty: 10

## 2015-07-18 MED FILL — AMPICILLIN-SULBACTAM SODIUM 1.5 (1-0.5) G IJ SOLR: 1.5 (1-0.5) g | INTRAMUSCULAR | Qty: 1.5

## 2015-07-18 MED FILL — LIDOCAINE HCL 2 % IJ SOLN: 2 % | INTRAMUSCULAR | Qty: 20

## 2015-07-18 MED FILL — FENTANYL CITRATE (PF) 100 MCG/2ML IJ SOLN: 100 MCG/2ML | INTRAMUSCULAR | Qty: 2

## 2015-07-18 MED FILL — SODIUM BICARBONATE 8.4 % IV SOLN: 8.4 % | INTRAVENOUS | Qty: 75

## 2015-07-18 MED FILL — ROCURONIUM BROMIDE 50 MG/5ML IV SOLN: 50 MG/5ML | INTRAVENOUS | Qty: 5

## 2015-07-18 MED FILL — FRESENIUS PROPOVEN 200 MG/20ML IV EMUL: 200 MG/20ML | INTRAVENOUS | Qty: 20

## 2015-07-18 NOTE — ED Provider Notes (Signed)
Triage Chief Complaint:   Seizures (possible unwitnessed seizure )    HOPI:  Roy Weaver is a 30 y.o. male that presents with right arm and leg pain, extremity swelling, and unresponsive episode.  He was found on the bathroom floor this morning by his mother and EMS was called.  He was unconscious when she found him but was breathing.  EMS was able to arouse the patient and he admitted to injecting heroin at 11:00.  He thought it was an hour ago, but on further questioning it was at 11 PM last night.  Said he felt dizzy and was on the floor and could not get up.  He has had a history of seizures, but this seems more likely related to heroin use.  He has a mild headache but does not have neck pain, chest pain, abdominal pain, nausea or vomiting, but does have difficulty moving his right leg and pain in his right arm and right leg.    ROS:  At least 10 systems reviewed and otherwise acutely negative except as in the [place].    Past Medical History   Diagnosis Date   ??? Migraine    ??? Psychiatric problem    ??? Seizures ([place])      History reviewed. No pertinent past surgical history.  Family History   Problem Relation Age of Onset   ??? Diabetes Father      Social History     Social History   ??? Marital status: Single     Spouse name: N/A   ??? Number of children: 0   ??? Years of education: 76     Occupational History   ??? Not on file.     Social History Main Topics   ??? Smoking status: Current Some Day Smoker     Packs/day: 1.00     Years: 2.00     Types: Cigarettes   ??? Smokeless tobacco: Former Systems developer     Quit date: 10/04/2013   ??? Alcohol use 0.0 oz/week     0 Standard drinks or equivalent per week      Comment: occasioanly   ??? Drug use: Yes     Special: Marijuana, IV      Comment: smokes once every 3 weeks used heroin an hour ago    ??? Sexual activity: Not Currently     Other Topics Concern   ??? Not on file     Social History Narrative    ** Merged History Encounter **          Current Facility-Administered Medications    Medication Dose Route Frequency Provider Last Rate Last Dose   ??? sodium bicarbonate 50 mEq in sodium chloride 0.45 % 1,000 mL infusion   Intravenous Continuous Alfonso Ellis, MD 125 mL/hr at 07/18/15 1423     ??? 0.9 % sodium chloride infusion   Intravenous Continuous Elizbeth Squires, MD 75 mL/hr at 07/18/15 1505     ??? levETIRAcetam (KEPPRA) tablet 1,000 mg  1,000 mg Oral BID Evghenii Bacanurschi, MD       ??? sodium chloride flush 0.9 % injection 10 mL  10 mL Intravenous 2 times per day Elizbeth Squires, MD       ??? sodium chloride flush 0.9 % injection 10 mL  10 mL Intravenous PRN Evghenii Bacanurschi, MD       ??? acetaminophen (TYLENOL) tablet 650 mg  650 mg Oral Q4H PRN Elizbeth Squires, MD       ??? magnesium  hydroxide (MILK OF MAGNESIA) 400 MG/5ML suspension 30 mL  30 mL Oral Daily PRN Elizbeth Squires, MD       ??? ondansetron (ZOFRAN) injection 4 mg  4 mg Intravenous Q6H PRN Evghenii Bacanurschi, MD       ??? heparin (porcine) injection 5,000 Units  5,000 Units Subcutaneous 3 times per day Elizbeth Squires, MD       ??? ampicillin-sulbactam (UNASYN) 1.5 g IVPB minibag  1.5 g Intravenous Q6H Evghenii Bacanurschi, MD 100 mL/hr at 07/18/15 1611 1.5 g at 07/18/15 1611   ??? vancomycin (VANCOCIN) 1,500 mg in dextrose 5 % 250 mL IVPB  1,500 mg Intravenous Once Elizbeth Squires, MD         No Known Allergies    Nursing Notes Reviewed    Physical Exam:  ED Triage Vitals   Enc Vitals Group      BP 07/18/15 1149 91/59      Pulse 07/18/15 1149 103      Resp 07/18/15 1149 14      Temp 07/18/15 1149 98.1 ??F (36.7 ??C)      Temp Source 07/18/15 1149 Oral      SpO2 --       Weight 07/18/15 1149 176 lb 9.4 oz (80.1 kg)      Height 07/18/15 1149 '6\' 4"'$  (1.93 m)      Head Cir --       Peak Flow --       Pain Score --       Pain Loc --       Pain Edu? --       Excl. in [place]? --      GENERAL APPEARANCE: Awake and alert. Cooperative. No acute distress.   HEAD: Normocephalic. Small abrasion on the right forehead.  EYES:  EOM's grossly intact. Sclera anicteric.  ENT: Mucous membranes are moist. Tolerates saliva. No trismus.  NECK: Supple. No meningismus. Trachea midline.  No midline cervical spine tenderness.  HEART: RRR. Radial pulses 2+.  LUNGS: Respirations unlabored. CTAB  ABDOMEN: Soft. Non-tender. No guarding or rebound.  EXTREMITIES: swelling with some tension and the compartments in the right upper arm as well as forearm.  There is also some tightness in the right thigh diffusely.  Right calf is soft.  Compartments of the left upper extremity are soft.  Strong pedal pulse on the right lower extremity as well as strong right radial pulse.  SKIN: Warm and dry.  NEUROLOGICAL: No gross facial drooping. Moves all 4 extremities spontaneously, however he has significant weakness in the left lower extremity as well as some weakness in the right proximal arm muscles.  He does have good grip strength.  Sensation to touch intact in the extremities.  PSYCHIATRIC: Normal mood.    I have reviewed and interpreted all of the currently available lab results from this visit (if applicable):  Results for orders placed or performed during the hospital encounter of 07/18/15   CBC Auto Differential   Result Value Ref Range    WBC 17.6 (H) 4.0 - 11.0 K/uL    RBC 5.71 4.20 - 5.90 M/uL    Hemoglobin 17.0 13.5 - 17.5 g/dL    Hematocrit 52.0 40.5 - 52.5 %    MCV 91.1 80.0 - 100.0 fL    MCH 29.7 26.0 - 34.0 pg    MCHC 32.6 31.0 - 36.0 g/dL    RDW 13.3 12.4 - 15.4 %    Platelets 260 135 - 450 K/uL  MPV 7.9 5.0 - 10.5 fL    Neutrophils % 86.9 %    Lymphocytes Relative 5.2 %    Monocytes % 7.8 %    Eosinophils Relative Percent 0.0 %    Basophils % 0.1 %    Neutrophils # 15.3 (H) 1.7 - 7.7 K/uL    Lymphocytes # 0.9 (L) 1.0 - 5.1 K/uL    Monocytes # 1.4 (H) 0.0 - 1.3 K/uL    Eosinophils # 0.0 0.0 - 0.6 K/uL    Basophils # 0.0 0.0 - 0.2 K/uL   Comprehensive Metabolic Panel   Result Value Ref Range    Sodium 138 136 - 145 mmol/L    Potassium 7.0 (HH) 3.5 -  5.1 mmol/L    Chloride 97 (L) 99 - 110 mmol/L    CO2 17 (L) 21 - 32 mmol/L    Anion Gap 24 (H) 3 - 16    Glucose 105 (H) 70 - 99 mg/dL    BUN 22 (H) 7 - 20 mg/dL    CREATININE 2.6 (H) 0.9 - 1.3 mg/dL    GFR Non-African American 29 (A) >60    GFR African American 36 (A) >60    Calcium 7.1 (L) 8.3 - 10.6 mg/dL    Total Protein 8.2 6.4 - 8.2 g/dL    Alb 4.2 3.4 - 5.0 g/dL    Albumin/Globulin Ratio 1.1 1.1 - 2.2    Total Bilirubin 0.3 0.0 - 1.0 mg/dL    Alkaline Phosphatase 100 40 - 129 U/L    ALT 362 (H) 10 - 40 U/L    AST 2101 (H) 15 - 37 U/L    Globulin 4.0 g/dL   Levetiracetam Level   Result Value Ref Range    Levetiracetam Lvl 14.5 6.0 - 46.0 ug/mL    KEPPRA Dose Amt Unknown    Lactic Acid, Plasma   Result Value Ref Range    Lactic Acid 2.7 (H) 0.4 - 2.0 mmol/L   Urine, reflex to culture   Result Value Ref Range    Color, UA DK YELLOW Straw/Yellow    Clarity, UA Clear Clear    Glucose, Ur Negative Negative mg/dL    Bilirubin Urine Negative Negative    Ketones, Urine Negative Negative mg/dL    Specific Gravity, UA 1.020 1.005 - 1.030    Blood, Urine LARGE (A) Negative    pH, UA 6.0 5.0 - 8.0    Protein, UA 100 (A) Negative mg/dL    Urobilinogen, Urine 0.2 <2.0 E.U./dL    Nitrite, Urine Negative Negative    Leukocyte Esterase, Urine Negative Negative    Microscopic Examination YES     Urine Reflex to Culture Not Indicated     Urine Type Not Specified    Urine Drug Screen   Result Value Ref Range    Amphetamine Screen, Urine Neg Negative <1000ng/mL    Barbiturate Screen, Ur Neg Negative <200 ng/mL    Benzodiazepine Screen, Urine Neg Negative <200 ng/mL    Cannabinoid Scrn, Ur POSITIVE (A) Negative <50 ng/mL    COCAINE METABOLITE SCREEN URINE Neg Negative <300 ng/mL    Opiate Scrn, Ur Neg Negative <300 ng/mL    PCP Scrn, Ur Neg Negative <25 ng/mL    Methadone Screen, Urine Neg Negative <300 ng/mL    Propoxyphene Scrn, Ur Neg Negative <300 ng/mL    pH, UA 6.0     Drug Screen Comment: see below     Oxycodone Urine Neg  Negative <100 ng/ml  CK   Result Value Ref Range    Total CK 269829 (H) 39 - 409 U/L   Salicylate   Result Value Ref Range    Salicylate, Serum <8.1 (L) 15.0 - 30.0 mg/dL   Acetaminophen Level   Result Value Ref Range    Acetaminophen Level <15 10 - 30 ug/mL   Osmolality   Result Value Ref Range    Osmolality 304 (H) 275 - 295 mOsm/kg   Microscopic Urinalysis   Result Value Ref Range    Hyaline Casts, UA 1 0 - 8 /HPF    WBC, UA 0 0 - 5 /HPF    RBC, UA 2 0 - 4 /HPF    Epi Cells 0 0 - 5 /HPF   Basic Metabolic Panel   Result Value Ref Range    Sodium 137 136 - 145 mmol/L    Chloride 99 99 - 110 mmol/L    CO2 21 21 - 32 mmol/L    Anion Gap 17 (H) 3 - 16    Glucose 103 (H) 70 - 99 mg/dL    BUN 24 (H) 7 - 20 mg/dL    CREATININE 2.6 (H) 0.9 - 1.3 mg/dL    GFR Non-African American 29 (A) >60    GFR African American 36 (A) >60    Calcium 7.4 (L) 8.3 - 10.6 mg/dL   Lactic Acid, Plasma   Result Value Ref Range    Lactic Acid 2.6 (H) 0.4 - 2.0 mmol/L   EKG 12 Lead   Result Value Ref Range    Ventricular Rate 103 BPM    Atrial Rate 103 BPM    P-R Interval 176 ms    QRS Duration 80 ms    Q-T Interval 320 ms    QTc Calculation (Bazett) 419 ms    P Axis -12 degrees    R Axis 92 degrees    T Axis 66 degrees    Diagnosis       Sinus tachycardia  Rightward axis  Voltage criteria for left ventricular hypertrophy  Early repolarization  Borderline ECG  When compared with ECG of 11-Jul-2015 20:14,  Vent. rate has increased BY  35 BPM  Confirmed by [facility] MD, JAMES ([id number]) on 07/18/2015 3:37:03 PM          Radiographs (if obtained):  '[]'$  The following radiograph was interpreted by myself in the absence of a radiologist:  '[x]'$  Radiologist's Report Reviewed:  Xr Humerus Right Standard    Result Date: 07/18/2015  EXAMINATION: AP AND LATERAL VIEWS OF THE RIGHT HUMERUS 07/18/2015 12:26 pm COMPARISON: None. HISTORY: ORDERING SYSTEM PROVIDED HISTORY: redness and swelling TECHNOLOGIST PROVIDED HISTORY: Ordering Physician Provided Reason for Exam:  reddness and swelling mid arm to elbow Acuity: Acute Type of Exam: Initial Mechanism of Injury: found down/possible seizure FINDINGS: There is no evidence of acute fracture.  There is normal alignment.  No acute joint abnormality.  No focal osseous lesion. No focal soft tissue abnormality.     No acute osseous abnormality.     Ct Head Wo Contrast    Result Date: 07/18/2015  EXAMINATION: CT OF THE HEAD WITHOUT CONTRAST  07/18/2015 1:00 pm TECHNIQUE: CT of the head was performed without the administration of intravenous contrast. Dose modulation, iterative reconstruction, and/or weight based adjustment of the mA/kV was utilized to reduce the radiation dose to as low as reasonably achievable. COMPARISON: CT head July 11, 2015. HISTORY: ORDERING SYSTEM PROVIDED HISTORY: HEAD TRAUMA, CLOSED, MILD, GCS >= 13, NO  RISK FACTORS, NEURO EXAM NORMAL TECHNOLOGIST PROVIDED HISTORY: Ordering Physician Provided Reason for Exam: poss seizure Acuity: Acute Type of Exam: Initial FINDINGS: BRAIN/VENTRICLES: There is no acute intracranial hemorrhage, mass effect or midline shift.  No abnormal extra-axial fluid collection.  Small area of encephalomalacia is again seen within the left frontal and temporal lobes. The gray-white differentiation is maintained without evidence of an acute infarct.  There is no evidence of hydrocephalus. ORBITS: The visualized portion of the orbits demonstrate no acute abnormality. SINUSES: There is new partial opacification of the right maxillary sinus with an air-fluid level.  Remainder paranasal sinuses are well aerated. SOFT TISSUES/SKULL:  There is mild soft tissue swelling in the right supraorbital region.  No underlying skull fracture or acute calvarial abnormality identified.     Soft tissue swelling right supraorbital region.  No evidence of intracranial hemorrhage or mass effect. Chronic small areas of encephalomalacia in the left frontal and temporal lobes. New air-fluid level within the right  maxillary sinus which may be related to sinusitis.  Given the patient's history, traumatic etiology not excluded.  If clinically indicated CT facial bones may be helpful for further evaluation.     Ct Head Wo Contrast    Result Date: 07/11/2015  EXAMINATION: CT OF THE HEAD WITHOUT CONTRAST  07/11/2015 9:40 pm TECHNIQUE: CT of the head was performed without the administration of intravenous contrast. Dose modulation, iterative reconstruction, and/or weight based adjustment of the mA/kV was utilized to reduce the radiation dose to as low as reasonably achievable. COMPARISON: 09/15/2013 HISTORY: ORDERING SYSTEM PROVIDED HISTORY: seizure Initial evaluation. FINDINGS: BRAIN/VENTRICLES: Bifrontal encephalomalacia, left greater than right. Anterior left temporal lobe encephalomalacia is also noted.  No evidence of mass effect or midline shift.  No evidence of hydrocephalus.  No gross acute hemorrhage.  Gray matter white matter differentiation is preserved. ORBITS: The visualized portion of the orbits demonstrate no acute abnormality. SINUSES: The visualized paranasal sinuses and mastoid air cells demonstrate no acute abnormality. SOFT TISSUES/SKULL:  Soft tissue swelling is seen of the posterior scalp near the vertex.     Bifrontal and left temporal lobe encephalomalacia.  No gross acute process. Soft tissue swelling of the posterior scalp near the vertex.     Xr Chest Portable    Result Date: 07/18/2015  EXAMINATION: SINGLE VIEW OF THE CHEST 07/18/2015 12:26 pm COMPARISON: 06/27/2014. HISTORY: ORDERING SYSTEM PROVIDED HISTORY: pain TECHNOLOGIST PROVIDED HISTORY: Ordering Physician Provided Reason for Exam: blisters lat mid chest wall Acuity: Acute Type of Exam: Initial Mechanism of Injury: found down lying on rt arm,unsure how long down FINDINGS: The lungs are without acute focal process.  There is no effusion or pneumothorax. The cardiomediastinal silhouette is stable. The osseous structures are stable.     No acute process.        EKG (if obtained): (All EKG's are interpreted by myself in the absence of a cardiologist)  EKG on my interpretation shows sinus tachycardia with a rate of 103 bpm.  Right axis deviation present.  Normal intervals otherwise.  Early repolarization pattern present.  There is some peaking of the septal T waves.  No significant changes compared to EKG on May 18, 2014.    MDM:  Differential diagnosis: possible rhabdomyolysis from seizure versus accidental heroin overdose versus syncope vs unlikely cellulitis or stroke.    Patient has swelling and some tightness in the compartments diffusely of the upper proximal extremity, right forearm, as well as right thigh.  There is some swelling over the right  side of the chest wall.  There is erythema without cellulitis and it is not the touch.  It seems consistent with significant pressure from lying on his right side for a prolonged period of time.  I'm concerned he may have increased compartment pressures and rhabdomyolysis given his exam.  Stat CT head and labs have been ordered.  Struck her kit was used to evaluate compartment pressures in the upper as well as lower extremity on the right side.  There was no pressure greater than 10 with multiple checks.  The distal extremities are soft and he has strong pulse.  He does have neurologic deficits with weakness diffusely on the right side.  He will need admission after workup.     Patient has CK greater than 200,000.  He also has acute kidney injury.  No acute bony injury on extremity x-rays.  No significant findings on head CT.  2 L of IV fluids been ordered.  Patient found out hyperkalemia with some EKG changes.  Insulin and glucose, bicarb, calcium, and Kayexalate ordered in conjunction with nephrology consult.  Patient will need to be admitted to the ICU for further care.  Dr. Kirke Shaggy with orthopedic surgery if I would the patient as he has significant motor dysfunction with severe rhabdomyolysis with normal compartment  pressures at this time.    Old records reviewed. Labs and imaging reviewed and results discussed with patient.        Patient was given scripts for the following medications. I counseled patient how to take these medications.   Current Discharge Medication List            CRITICAL CARE TIME   Total Critical Care time was 35 minutes, excluding separately reportable procedures.  There was a high probability of clinically significant/life threatening deterioration in the patient's condition which required my urgent intervention.      Clinical Impression:  1. Traumatic rhabdomyolysis, initial encounter ([place])    2. Hyperkalemia    3. AKI (acute kidney injury) ([place])       (Please note that portions of this note may have been completed with a voice recognition program. Efforts were made to edit the dictations but occasionally words are mis-transcribed.)    Alfonso Ellis, MD        Alfonso Ellis, MD  07/18/15 [phone]

## 2015-07-18 NOTE — Progress Notes (Signed)
Circumference::  RUA: 12.5in  RLA: 12in    R Thigh: 21in  R Calf: 13.5in    LUA: 11in  LLA: 10in    L Thigh: 19in  L Calf: 13in    Electronically signed by Edd Fabian, RN on 07/18/2015 at 4:18 PM

## 2015-07-18 NOTE — Anesthesia Post-Procedure Evaluation (Signed)
Anesthesia Post-op Note    Patient: Roy Weaver  MRN: [id number]  Birthdate: 1986/01/07  Date of evaluation: 07/18/2015  Time:  10:19 PM         [facility] Department of Anesthesiology  Post-Anesthesia Note       Name:  VICKIE PONDS                                  Age:  30 y.o.  MRN:  [id number]     Last Vitals & Oxygen Saturation:   Visit Vitals   ??? BP 92/77   ??? Pulse 84   ??? Temp 97 ??F (36.1 ??C) (Temporal)   ??? Resp 14   ??? Ht  (1.93 m)   ??? Wt 176 lb 12.9 oz (80.2 kg)   ??? SpO2 98%   ??? BMI 21.52 kg/m2     Patient Vitals for the past 4 hrs:   BP Temp Temp src Pulse Resp SpO2   07/18/15 2206 92/77 97 ??F (36.1 ??C) Temporal 84 14 -   07/18/15 1900 126/86 - - 103 13 98 %       Level of consciousness:  Sedated on vent    Respiratory: Vented    Cardiovascular: Hemodynamically stable.    Post-op assessment: Tolerated anesthetic well without complication.    Complications:  None.  Transported to ICU on propac and ambu bag.  No issues.  Report given.    Erlinda Hong, MD  July 18, 2015   10:19 PM      Anesthesia Post Evaluation    Erlinda Hong, MD  10:19 PM

## 2015-07-18 NOTE — Brief Op Note (Signed)
Brief Postoperative Note    Roy Weaver  Date of Birth:  [birth year]  [id number]    Pre-operative Diagnosis: Right forearm compartment syndrome.    Post-operative Diagnosis: Same    Procedure: Right forearm decompression fasciotomy volar compartment.    Anesthesia: General    Surgeons/Assistants: Shonna Deiter/Shanon     Estimated Blood Loss: less than 50     Complications: None    Specimens: Was Not Obtained    Findings: Same    Electronically signed by Filbert Berthold, MD on 07/18/2015 at 10:09 PM

## 2015-07-18 NOTE — Op Note (Signed)
[facility]             [address] [place], [place]  [address] -[phone]                                   OPERATIVE REPORT    PATIENT NAME:  Roy Weaver, Roy J.                DOB:      [birth year]  MED REC NO:    [id number]                       ROOM:     [id number]  ACCOUNT NO:    [id number]                       ADMISSION DATE: 07/18/2015  PHYSICIAN:     Helane Gunther. Su Hoff, MD                  DATE OF PROCEDURE:  07/18/2015    PRIMARY CARE PHYSICIAN:  Dr. Dianna Rossetti Haq.      SURGEON:  Wandra Mannan, MD.    Threasa HeadsCarollee Herter, surgical assistant.    PREOPERATIVE DIAGNOSIS:  Right forearm volar compartment syndrome, flexor  compartment.    POSTOPERATIVE DIAGNOSIS:  Right forearm volar compartment syndrome, flexor  compartment.     PROCEDURE DONE:  Decompression fasciotomy, right forearm flexor compartment  without  debridement.    ANESTHESIA:  General anesthesia.    ESTIMATED BLOOD LOSS:  Minimal.    COMPLICATIONS:  None.    FINDINGS:  Tight flexor compartment with no necrotic tissue with good viable  muscle.    INDICATION:  This is a 30 year old African American male who was admitted to  the ICU from the ER for a rhabdomyolysis.  The patient apparently had a  possible seizure as well as a history for drug abuse and he was laying on the  bathroom overnight.  His mother found him in the morning and called the EMS.   He was found to have a very high CK.  He was seen initially in the ICU, his  thigh compartment was very tight and it was evaluated by the vascular surgeon.   His right forearm was mildly tight, but no immediate risk of compartment  syndrome at the time.  He was then taken to the OR for his right thigh where  he underwent decompression fasciotomy by the vascular surgeon.  Given that the  patient was in the OR and the right forearm started getting more tight, we  were reconsulted to evaluate for a fasciotomy.  I did discuss over the phone  the condition with  his father prior to the procedure, indicating the fact that  this is an emergent procedure.  We signed the consent on his behalf after we  discussed with his father over the phone.    DESCRIPTION OF PROCEDURE:  The patient's right forearm was prepped and draped  in regular sterile routine fashion.  We performed a decompression fasciotomy  over the flexor compartment.  A longitudinal incision was made volar aspect right forearm and with that we made a  fasciotomy and the forearm muscles were viable, healthy, and red with no  necrotic muscles.  Through that incision, we extended the fasciotomy  underneath the skin both proximally and distally.  We  achieved good  decompression of the flexor compartment.  We did not feel that the dorsal  compartment needed to be decompressed.  At this point, given that the muscles  were still viable and the compartment was not very tight after the  decompression, we elected to partially approximate the skin and we used a  vessel loop along with staples in a crossing fashion to approximate the skin  edges.  Dressing was then applied in the form of Adaptic, 4 x 4, ABD, Kerlix,  and Ace wrap.    The patient tolerated the procedure well and was taken back to the ICU in  stable but still critical condition.    POSTOPERATIVE PLAN:  The patient's right forearm will be elevated.  He will  need a staged procedure in 2-3 days after the pressure improves for a  secondary closure of his forearm fasciotomy.        Lametria Klunk M. Su Hoff, MD      D: 07/18/2015 23:23:42  T: 07/18/2015 23:59:55  SMA/nts  Job#: [id number]  Doc#: [id number]

## 2015-07-18 NOTE — Progress Notes (Signed)
[facility]  Progress Note      The Patient was taken to surgery for Decompression fasciotomy of his right leg compartment by Dr Boston Service, and I was called to re-evaluate his right forearm, as the sweating increased.  The patient was evaluated in the OR after he had his leg surgery and the right forearm was found to be tight and more swollen c/w compartment syndrome.    At this point decision was made to proceed with a decompression fasciotomy of right forearm.I did speak with his father over the phone and he agreed to proceed as the surgery was necessary at this point.        Helane Gunther Dacia Capers  07/18/2015  10:09 PM

## 2015-07-18 NOTE — Progress Notes (Signed)
To ICU for recovery from OR, unresponsive on vent, #8 ETT present, at 24 LL, placed to vent per RT. Bilateral wrist restraints placed.

## 2015-07-18 NOTE — Consults (Signed)
[facility]             [address] [place], [place]  [address] [phone]                                     CONSULTATION    PATIENT NAME:  Roy Weaver, Roy J.                DOB:      1986-05-07  MED REC NO:    [id number]                       ROOM:     [id number]  ACCOUNT NO:    [id number]                       ADMISSION DATE: 07/18/2015  PHYSICIAN:     Earl Gala, MD                    DATE OF CONSULTATION:  07/18/2015    NEPHROLOGY CONSULTATION NOTE    REASON FOR CONSULTATION:  Acute kidney injury, hyperkalemia.    HISTORY OF PRESENT ILLNESS:  The patient is a 30 year old gentleman with  history of polysubstance abuse, seizure disorder and history of noncompliance  and numerous hospitalizations with seizures.  He apparently was just  discharged from [facility] on 07/12/2015 after being admitted  for a seizure.  The patient was brought to the Emergency Room by EMS after  having been found down in his bathroom by his mother.  He apparently was doing  heroin in his bathroom last night around 10 p.m., when he collapsed and was in  and out of consciousness all night.  His mother noticed some voices from his  room and thought it was the television.  He was noted to be groaning through  the night a few times.  In the a.m. around 9 a.m., the patient was found down  in the bathroom.  He was able to be woken up, but was not improving and was  brought to the Emergency Room.  In the ED, the patient was noted to have a CPK  level of 269,000.  His initial potassium was elevated at 7 mEq.  His potassium  initially was noted to be 7.0 mEq.  He was treated medically in the Emergency  Room for hyperkalemia.  His initial labs showed creatinine of 2.6.  The  patient has no previous history of renal problems.  We are consulted for his  acute kidney injury, hyperkalemia and rhabdomyolysis.  The patient is seen in  ICU.  He is complaining of pain in his right arm and  right leg.  He apparently  had been lying on his right side all night.  He is currently making urine, had  about 330 mL of urine output since arrival to the ICU.  His repeat potassium  is down to 4.7 mEq.    PAST MEDICAL HISTORY:  As noted, remarkable for a seizure disorder, history of  substance abuse and history of migraines.    ALLERGIES:  No known drug allergies.    MEDICATIONS:  The patient is supposed to be on Keppra, but has been  noncompliant with his medications.    SOCIAL HISTORY:  He lives with his mother.  He has history of substance abuse  including  cocaine.  He does smoke cigarettes and alcohol.  His drug screen has  tested positive for marijuana as well.    FAMILY HISTORY:  He reports his father has end-stage renal disease and is  currently on dialysis.    REVIEW OF SYSTEMS:  As noted, the patient was found down, lying on his right  side all night for about 10-11 hours.  He is complaining of pain in his right  arm and right thigh.  He is not able to move his right toes, as well as having  trouble grasping with his right hand.  Denies any chest pain, shortness of  breath, nausea, vomiting, diarrhea or abdominal pain.  Denies any flank pains.   He has noted a [employer] colored urine.  Denies any dysuria or hematuria.  No  history of kidney stones.    PHYSICAL EXAMINATION:  GENERAL:  Reveals a young gentleman in no acute distress.  He is awake and  alert.  VITAL SIGNS:  Blood pressure is 121/83, pulse 94, temperature 97.8 degrees.  HEENT:  Reveals dry mucous membranes.  No pallor or icterus.  No ear or nasal  discharge.  NECK:  Supple.  No JVD noted.  CHEST:  Clear to auscultation bilaterally.  CVS:  Regular rhythm.  No rubs or gallops noted.  ABDOMEN:  Tender in the right upper quadrant.  EXTREMITIES:  Reveal swelling and tenderness over his right forearm muscle  group, with decreased hand grasp on the right side.  His fingers appear to be  swollen as well.  His right thigh, as well as right calf also  have tense  swelling and tenderness.  He is unable to wiggle his right toes.  CNS:  He is awake and alert, answering questions appropriately.    LABORATORY DATA:  Sodium 139, potassium 4.7, chloride 103, CO2 21, BUN 26,  creatinine 3.1, calcium 7.0, phosphorus 4.2.  His ALT is 362.  AST is 2,700.   Albumin 4.2, total CPK is 269,829.  Urinalysis shows large blood, only 2 RBCs,  negative nitrite.    IMPRESSION:  1.  Acute kidney injury due to rhabdomyolysis.  Renal function is worsening;  however, he is currently nonoliguric, expect renal function to worsen.  2.  Hyperkalemia, treated.  Repeat potassium is down to 4.7 mEq.  The patient  is at risk of rebound hyperkalemia because of his severe rhabdomyolysis.  3.  Rhabdomyolysis, appears to be having evidence of compartment syndrome in  his right arm and the right lower extremity.  4.  History of polysubstance abuse.  5.  History of seizure disorder.  6.  History of noncompliance.    RECOMMENDATIONS:  1.  We will change his IV fluids to half normal saline with 75 mEq of sodium  bicarbonate at 200 mL per hour.  2.  Monitor potassium, renal function and CPK closely.  3.  No indication for acute dialysis at this point; however, he understands  that renal function may worsen to the point of needing dialysis.  4.  Surgery and orthopedic consults have been placed.    Thank you for the consultation.  We will follow the patient from the renal  standpoint.        Earl Gala, MD      D: 07/18/2015 17:21:59  T: 07/18/2015 18:15:28  TS/nts  Job#: [id number]  Doc#: [id number]

## 2015-07-18 NOTE — Consults (Signed)
[facility]  Consult Note        This patient is seen in consultation at the request of Dr Levin Bacon, MD    Reason for Consult:  Right arm pain/R/O compartment syndrome     CHIEF COMPLAINT:  Right arm and leg pain.    History Obtained From:  patient, electronic medical record    HISTORY OF PRESENT ILLNESS:    Roy Weaver is a 30 y.o. male RHD w Hx of polysubstance abuse, seizure disorder and non compliance with medical management, with numerous recent admissions and presentations to ED with seizures, just discharged from [facility] Feb 18, who presents to [facility] after he was found down at home.   Per pt he was doing heroin in the bathroom at home last night around 10-11pm. Reports that he collapsed in the bathroom and was in/out of consciousness all night.   His mother states that last night around 10 she heard him upstairs in his room and thought he was was with friends and watching TV. She went to bed. Heard groaning through the night a few times - thought it was the TV.   In am around 9 she went up and found him down in the bathroom. Was able to wake him up, but by 10-11 realized that he was not getting better and took him to ED via EMS.     Past Medical History:        Diagnosis Date   ??? Migraine    ??? Psychiatric problem    ??? Seizures (HCC)    ??? Traumatic rhabdomyolysis [facility])        Past Surgical History:    History reviewed. No pertinent past surgical history.    Current Medications:   Current Facility-Administered Medications: levETIRAcetam (KEPPRA) tablet 1,000 mg, 1,000 mg, Oral, BID  sodium chloride flush 0.9 % injection 10 mL, 10 mL, Intravenous, 2 times per day  sodium chloride flush 0.9 % injection 10 mL, 10 mL, Intravenous, PRN  acetaminophen (TYLENOL) tablet 650 mg, 650 mg, Oral, Q4H PRN  magnesium hydroxide (MILK OF MAGNESIA) 400 MG/5ML suspension 30 mL, 30 mL, Oral, Daily PRN  ondansetron (ZOFRAN) injection 4 mg, 4 mg, Intravenous, Q6H PRN  heparin (porcine) injection  5,000 Units, 5,000 Units, Subcutaneous, 3 times per day  ampicillin-sulbactam (UNASYN) 1.5 g IVPB minibag, 1.5 g, Intravenous, Q6H  vancomycin (VANCOCIN) 1,500 mg in dextrose 5 % 250 mL IVPB, 1,500 mg, Intravenous, Once  vancomycin (VANCOCIN) intermittent dosing (placeholder), , Other, RX Placeholder  sodium bicarbonate 75 mEq in sodium chloride 0.45 % 1,000 mL infusion, , Intravenous, Continuous  Allergies:  Review of patient's allergies indicates no known allergies.    Social History     Social History   ??? Marital status: Single     Spouse name: N/A   ??? Number of children: 0   ??? Years of education: 25     Occupational History   ??? Not on file.     Social History Main Topics   ??? Smoking status: Current Some Day Smoker     Packs/day: 1.00     Years: 2.00     Types: Cigarettes   ??? Smokeless tobacco: Former Neurosurgeon     Quit date: 10/04/2013   ??? Alcohol use 0.0 oz/week     0 Standard drinks or equivalent per week      Comment: occasioanly   ??? Drug use: Yes     Special: Marijuana, IV  Comment: smokes once every 3 weeks used heroin an hour ago    ??? Sexual activity: Not Currently     Other Topics Concern   ??? Not on file     Social History Narrative    ** Merged History Encounter **            Family History   Problem Relation Age of Onset   ??? Diabetes Father        REVIEW OF SYSTEMS:    As in HPI otherwise unremarkable    PHYSICAL EXAM:    VITALS:    Visit Vitals   ??? BP (!) 170/86   ??? Pulse 98   ??? Temp 98.1 ??F (36.7 ??C) (Oral)   ??? Resp 19   ??? Ht 6\' 4"  (1.93 m)   ??? Wt 176 lb 9.4 oz (80.1 kg)   ??? SpO2 100%   ??? BMI 21.5 kg/m2       MUSCULOSKELETAL:   Right arm with moderate swelling, forearm compartment are swollen, but still soft. No pain with wrist PROM AROM. Good radial pulse. Mild ulnar nerve palsy.  Right thigh is tight, no significant tenderness. He is unable to move his right ankle. Good pedal pulses bilateral, confirmed with doppler.    NEUROLOGIC:   Sensory:    Touch:                     Right Upper Extremity:   abnormal - Decrease sensation right hand ulnar side.                   Left Upper Extremity:  normal                  Right Lower Extremity:  abnormal - no sensation or ROM right ankle                  Left Lower Extremity:  normal        DATA:    CBC:   Lab Results   Component Value Date    WBC 17.6 07/18/2015    RBC 5.71 07/18/2015    HGB 17.0 07/18/2015    HCT 52.0 07/18/2015    MCV 91.1 07/18/2015    MCH 29.7 07/18/2015    MCHC 32.6 07/18/2015    RDW 13.3 07/18/2015    PLT 260 07/18/2015    MPV 7.9 07/18/2015     WBC:    Lab Results   Component Value Date    WBC 17.6 07/18/2015     PT/INR:    Lab Results   Component Value Date    PROTIME 11.7 03/29/2014    INR 1.03 03/29/2014     PTT:    Lab Results   Component Value Date    APTT 31.1 09/13/2013   [APTT    IMAGING: X-rays were taken 07/18/2015, 3 views of the right humerus, and showed no fracture. No other abnormality       IMPRESSION:  Right forearm swelling, no significant tightness, no clinical indication of right forearm compartment syndrome.      PLAN:  Elevation right forearm.  Splint applied right wrist for ulnar nerve palsy.    Right thigh will be evaluated by vascular surgery.    We will re-evaluate the forearm in am.    Thank you very much for the kind consultation and allowing me to participate in this patient's care.  I will continue to keep you apprised of his progress.  Filbert Berthold, MD  07/18/2015  3:02 PM

## 2015-07-18 NOTE — ED Notes (Signed)
Care returned to TW RN     Cordelia Pen, RN  07/18/15 1440

## 2015-07-18 NOTE — Anesthesia Pre-Procedure Evaluation (Signed)
Department of Anesthesiology  Preprocedure Note       Name:  Roy Weaver   Age:  30 y.o.  DOB:  [birth year]                                          MRN:  [id number]         Date:  07/18/2015          [facility] Department of Anesthesiology  Pre-Anesthesia Evaluation/Consultation       Name:  Roy Weaver                                         Age:  30 y.o.  MRN:  [id number]           Procedure (Scheduled):  Right thigh fasciotomy  Surgeon:  Dr. Ramond Dial     No Known Allergies  Patient Active Problem List   Diagnosis   ??? Seizures (HCC)   ??? Migraine   ??? Recurrent seizures (HCC)   ??? History of nonadherence to medical treatment   ??? Delirium   ??? Drug use   ??? Partial epilepsy with impairment of consciousness (HCC)   ??? Seizures (HCC)   ??? Partial symptomatic epilepsy with complex partial seizures, intractable, without status epilepticus (HCC)   ??? Noncompliance with medication regimen   ??? Aspiration pneumonia of right lung (HCC)   ??? 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 Seizure (HCC)   ??? Visit for suture removal   ??? Post-ictal state (HCC)   ??? Breakthrough seizure (HCC)   ??? Traumatic rhabdomyolysis [facility])     Past Medical History   Diagnosis Date   ??? Migraine    ??? Psychiatric problem    ??? Seizures (HCC)    ??? Traumatic rhabdomyolysis (HCC)      History reviewed. No pertinent past surgical history.  Social History   Substance Use Topics   ??? Smoking status: Current Some Day Smoker     Packs/day: 1.00     Years: 2.00     Types: Cigarettes   ??? Smokeless tobacco: Former Neurosurgeon     Quit date: 10/04/2013   ??? Alcohol use 0.0 oz/week     0 Standard drinks or equivalent per week      Comment: occasioanly     Medications  No current facility-administered medications on file prior to encounter.      Current Outpatient Prescriptions on File Prior to Encounter   Medication Sig Dispense Refill   ??? levETIRAcetam (KEPPRA) 1000 MG tablet Take 1 tablet by mouth 2 times daily 60 tablet 6     Current  Facility-Administered Medications   Medication Dose Route Frequency Provider Last Rate Last Dose   ??? levETIRAcetam (KEPPRA) tablet 1,000 mg  1,000 mg Oral BID Evghenii Bacanurschi, MD       ??? sodium chloride flush 0.9 % injection 10 mL  10 mL Intravenous 2 times per day Bernette Mayers, MD       ??? sodium chloride flush 0.9 % injection 10 mL  10 mL Intravenous PRN Evghenii Bacanurschi, MD       ??? acetaminophen (TYLENOL) tablet 650 mg  650 mg Oral Q4H PRN Bernette Mayers, MD       ???  magnesium hydroxide (MILK OF MAGNESIA) 400 MG/5ML suspension 30 mL  30 mL Oral Daily PRN Bernette Mayers, MD       ??? ondansetron (ZOFRAN) injection 4 mg  4 mg Intravenous Q6H PRN Evghenii Bacanurschi, MD       ??? heparin (porcine) injection 5,000 Units  5,000 Units Subcutaneous 3 times per day Bernette Mayers, MD       ??? ampicillin-sulbactam (UNASYN) 1.5 g IVPB minibag  1.5 g Intravenous Q6H Bernette Mayers, MD   Stopped at 07/18/15 1642   ??? vancomycin (VANCOCIN) 1,500 mg in dextrose 5 % 250 mL IVPB  1,500 mg Intravenous Once Evghenii Bacanurschi, MD 125 mL/hr at 07/18/15 1732 1,500 mg at 07/18/15 1732   ??? vancomycin (VANCOCIN) intermittent dosing [employer])   Other RX Placeholder Bernette Mayers, MD       ??? sodium bicarbonate 75 mEq in sodium chloride 0.45 % 1,000 mL infusion   Intravenous Continuous Earl Gala, MD 200 mL/hr at 07/18/15 1728       Vital Signs (Current)   Vitals:    07/18/15 1800   BP: (!) 141/92   Pulse: 106   Resp: 18   Temp:    SpO2: 96%     Vital Signs Statistics (for past 48 hrs)     BP  Min: 91/59   Min taken time: 07/18/15 1149  Max: 170/86   Max taken time: 07/18/15 1432  Temp  Avg: 98 ??F (36.7 ??C)  Min: 97.8 ??F (36.6 ??C)   Min taken time: 07/18/15 1553  Max: 98.1 ??F (36.7 ??C)   Max taken time: 07/18/15 1149  Pulse  Avg: 99.8  Min: 94   Min taken time: 07/18/15 1600  Max: 106   Max taken time: 07/18/15 1800  Resp  Avg: 14.6  Min: 9   Min taken time: 07/18/15 1356  Max: 19   Max  taken time: 07/18/15 1432  SpO2  Avg: 97.4 %  Min: 86 %   Min taken time: 07/18/15 1500  Max: 100 %   Max taken time: 07/18/15 1432    BP Readings from Last 3 Encounters:   07/18/15 (!) 141/92   07/12/15 103/61   07/11/15 126/63     BMI  Body mass index is 21.52 kg/(m^2).  Estimated body mass index is 21.52 kg/(m^2) as calculated from the following:    Height as of this encounter: 6\' 4"  (1.93 m).    Weight as of this encounter: 176 lb 12.9 oz (80.2 kg).    CBC   Lab Results   Component Value Date    WBC 17.6 07/18/2015    RBC 5.71 07/18/2015    HGB 17.0 07/18/2015    HCT 52.0 07/18/2015    MCV 91.1 07/18/2015    RDW 13.3 07/18/2015    PLT 260 07/18/2015     CMP    Lab Results   Component Value Date    NA 139 07/18/2015    K 4.7 07/18/2015    CL 103 07/18/2015    CO2 21 07/18/2015    BUN 26 07/18/2015    CREATININE 3.1 07/18/2015    CREATININE 1.0 08/17/2011    GFRAA 29 07/18/2015    GFRAA 157 08/19/2011    AGRATIO 1.1 07/18/2015    LABGLOM 24 07/18/2015    GLUCOSE 106 07/18/2015    PROT 8.2 07/18/2015    CALCIUM 7.0 07/18/2015    BILITOT 0.3 07/18/2015    ALKPHOS 100 07/18/2015    AST 2101  07/18/2015    ALT 362 07/18/2015     BMP    Lab Results   Component Value Date    NA 139 07/18/2015    K 4.7 07/18/2015    CL 103 07/18/2015    CO2 21 07/18/2015    BUN 26 07/18/2015    CREATININE 3.1 07/18/2015    CREATININE 1.0 08/17/2011    CALCIUM 7.0 07/18/2015    GFRAA 29 07/18/2015    GFRAA 157 08/19/2011    LABGLOM 24 07/18/2015    GLUCOSE 106 07/18/2015     POCGlucose  Recent Labs      07/18/15   1215  07/18/15   1337  07/18/15   1550   GLUCOSE  105*  103*  106*      Coags    Lab Results   Component Value Date    PROTIME 11.7 03/29/2014    INR 1.03 03/29/2014    APTT 31.1 09/13/2013     HCG (If Applicable) No results found for: PREGTESTUR, PREGSERUM, HCG, HCGQUANT   ABGs   Lab Results   Component Value Date    PHART 7.356 03/29/2014    PO2ART 296.3 03/29/2014    PCO2ART 46.4 03/29/2014    HCO3ART 25.4 03/29/2014    BEART  -0.5 03/29/2014    O2SATART 99.8 03/29/2014      Type & Screen (If Applicable)  No results found for: LABABO, LABRH        NPO Status:  8 hours                                                                               BMI:   Wt Readings from Last 3 Encounters:   07/18/15 176 lb 12.9 oz (80.2 kg)   07/12/15 179 lb (81.2 kg)   07/11/15 190 lb (86.2 kg)     Body mass index is 21.52 kg/(m^2).    Anesthesia Evaluation  Patient summary reviewed no history of anesthetic complications:   Airway: Mallampati: II  TM distance: >3 FB   Neck ROM: full   Dental: normal exam         Pulmonary:normal exam    (+) pneumonia (possible aspiratoin):      (-) COPD and shortness of breath       Cardiovascular:  Exercise tolerance: good (>4 METS),       (-) hypertension, valvular problems/murmurs, past MI and CAD    ECG reviewed  Rhythm: regular  Rate: normal              Beta Blocker:  Not on Beta Blocker   Neuro/Psych:   (+) seizures:, neuromuscular disease:, headaches:, psychiatric history:   GI/Hepatic/Renal:   (+) renal disease (Rhabdomyolysis): ARF,         Endo/Other:        (-) hypothyroidism, blood dyscrasia    Abdominal:                    Anesthesia Plan    ASA 3 - emergent     general     intravenous induction   Anesthetic plan and risks discussed with patient.      DOS STAFF ADDENDUM:  Pt seen and examined, chart reviewed (including anesthesia, drug and allergy history).  No interval changes to history and physical examination.  Anesthetic plan, risks, benefits, alternatives, and personnel involved discussed with patient.  Patient verbalized an understanding and agrees to proceed.      Erlinda Hong, MD  July 18, 2015  7:08 PM        Erlinda Hong, MD   07/18/2015

## 2015-07-18 NOTE — Consults (Signed)
Vascular Surgery Consultation    Date of Admission:  07/18/2015  2:53 PM  Date of Consultation:  07/18/2015    PCP:  Baldwin Jamaica, MD       Chief Complaint: Arm and leg pain    History of Present Illness:   We are asked to see this patient in consultation by Dr. Lodema Hong regarding rhabdomyolysis.   Roy Weaver is a 30 y.o. male who with history of polysubstance abuse.  Was in bathroom at home last night doing heroin and collapsed ~10-11 pm. Found by mother ~ 9am, brought to ED 10-11am.  He was noted to have extremely swollen R thigh and forearm.  Reports no to minimal sensation R Foot. He has significant elevation of CK and elevated Creat with poor UO.       Past Medical History:  Past Medical History   Diagnosis Date   ??? Migraine    ??? Psychiatric problem    ??? Seizures (HCC)    ??? Traumatic rhabdomyolysis [facility])        Past Surgical History:  History reviewed. No pertinent past surgical history.    Home Medications:   Prior to Admission medications    Medication Sig Start Date End Date Taking? Authorizing Provider   levETIRAcetam (KEPPRA) 1000 MG tablet Take 1 tablet by mouth 2 times daily 06/27/15  Yes San Jetty, MD        Facility Administered Medications:   ??? levETIRAcetam  1,000 mg Oral BID   ??? sodium chloride flush  10 mL Intravenous 2 times per day   ??? heparin (porcine)  5,000 Units Subcutaneous 3 times per day   ??? ampicillin-sulbactam  1.5 g Intravenous Q6H   ??? vancomycin  1,500 mg Intravenous Once   ??? vancomycin (VANCOCIN) intermittent dosing [employer] [employer])   Other RX Placeholder       Allergies:  Review of patient's allergies indicates no known allergies.     Social History:      Social History     Social History   ??? Marital status: Single     Spouse name: N/A   ??? Number of children: 0   ??? Years of education: 23     Occupational History   ??? Not on file.     Social History Main Topics   ??? Smoking status: Current Some Day Smoker     Packs/day: 1.00     Years: 2.00     Types:  Cigarettes   ??? Smokeless tobacco: Former Neurosurgeon     Quit date: 10/04/2013   ??? Alcohol use 0.0 oz/week     0 Standard drinks or equivalent per week      Comment: occasioanly   ??? Drug use: Yes     Special: Marijuana, IV      Comment: smokes once every 3 weeks used heroin an hour ago    ??? Sexual activity: Not Currently     Other Topics Concern   ??? Not on file     Social History Narrative    ** Merged History Encounter **            Family History:        Problem Relation Age of Onset   ??? Diabetes Father        Review of Systems:  A 14 point review of systems was completed. Pertinent positives identified in the HPI, all other review of systems negative.      Physical Examination:    Visit  Vitals   ??? BP (!) 141/92   ??? Pulse 106   ??? Temp 97.8 ??F (36.6 ??C) (Oral)   ??? Resp 18   ??? Ht  (1.93 m)   ??? Wt 176 lb 12.9 oz (80.2 kg)   ??? SpO2 96%   ??? BMI 21.52 kg/m2          Admission Weight: 176 lb 9.4 oz (80.1 kg)       General appearance: NAD  Eyes: PERRLA  Neck: no JVD, no lymphadenopathy.  Respiratory: effort is unlabored, no crackles, wheezes or rubs.  Cardiovascular: regular, no murmur.   Pulses:    DP PT   RIGHT 1 -   LEFT 2 -   GI: abdomen soft, nondistended, no organomegaly.  Musculoskeletal: strength and tone normal.  Extremities: Right thigh massively swollen and tense. Calf soft.  R forearm tense.    Neuro/psychiatric: grossly intact.      MEDICAL DECISION MAKING/TESTING        CT:     Carotid duplex:       Labs:   CBC:   Recent Labs      07/18/15   1215   WBC  17.6*   HGB  17.0   HCT  52.0   MCV  91.1   PLT  260     BMP:   Recent Labs      07/18/15   1215  07/18/15   1337  07/18/15   1550   NA  138  137  139   K  7.0*  na*  4.7   CL  97*  99  103   CO2  17*  21  21   PHOS   --    --   4.2   BUN  22*  24*  26*   CREATININE  2.6*  2.6*  3.1*   CALCIUM  7.1*  7.4*  7.0*     Cardiac Enzymes:   Recent Labs      07/18/15   1215  07/18/15   1550   CKTOTAL  161096*  300560*     PT/INR: No results for input(s): PROTIME, INR in  the last 72 hours.  APTT: No results for input(s): APTT in the last 72 hours.  Liver Profile:  Lab Results   Component Value Date    AST 2101 07/18/2015    ALT 362 07/18/2015    BILIDIR 0.0 08/17/2011    BILITOT 0.3 07/18/2015    ALKPHOS 100 07/18/2015   No results found for: CHOL, HDL, TRIG  TSH:  Lab Results   Component Value Date    TSH 0.61 08/17/2011     UA:   Lab Results   Component Value Date    COLORU DK YELLOW 07/18/2015    PHUR 6.0 07/18/2015    PHUR 6.0 07/18/2015    WBCUA 0 07/18/2015    RBCUA 2 07/18/2015    MUCUS 1+ 09/16/2013    BACTERIA 1+ 02/14/2014    CLARITYU Clear 07/18/2015    SPECGRAV 1.020 07/18/2015    LEUKOCYTESUR Negative 07/18/2015    UROBILINOGEN 0.2 07/18/2015    BILIRUBINUR Negative 07/18/2015    BLOODU LARGE 07/18/2015    GLUCOSEU Negative 07/18/2015    AMORPHOUS Present 08/17/2011           Diagnosis:  Right thigh compartment syndrome with motor and sensory loss.     Rhabdomyolysis with AKI secondary to above.   R Arm swelling- evaluated by  Ortho but they do not feel there is a forearm compartment at this time.    Polysubstance abuse    Plan:  To OR for thigh fasciotomies and placement of temporary dialysis catheter. Discussed procedure, risks, benefits, and alternatives with patient who appears to understand and consents.  He understands leg incisions will be left open and will either require delayed closure or skin grafting in future.

## 2015-07-18 NOTE — ED Notes (Signed)
Pt arrived by EMS.  EMS stated they were called for an unwitnessed seizure by the patients mother.  During triage I asked patient if he had taken any drugs that would have caused his seizure or for him to collapse.  He stated he did shoot up some heroin at 11pm last night and when he arrived he thought it was midnight but I explained to him that it was actually noon the next day.  He stated that he took the heroin and then "fell out"  Pt's mother explained to me on the phone that she went to bed at 11:30 pm and she heard him in his room and thought he was just watching tv.  Pt expressed to me his desire to get help with his drug addiction.  I told him I would do my best to help him.  Pt was very pleasant.  During assessment I noticed his right arm was red, warm and swollen.  Pt stated he couldn't move his arm or shoulder without severe pain.  He also states he can't move his right leg and on assessment it too was very swollen and painful.  There was a small amount of blistering on his right side/chest wall that was also swollen.       Wilford Grist, RN  07/21/15 [phone]

## 2015-07-18 NOTE — H&P (Signed)
Hospital Medicine History & Physical      PCP: Baldwin Jamaica, MD    Date of Admission: 07/18/2015    Date of Service: Pt seen/examined on 07/18/2015 and Admitted to Inpatient.    Chief Complaint:  Found down at home.       History Of Present Illness:     The patient is a 30 y.o. male  w Hx of polysubstance abuse, seizure disorder and non compliance with medical management, with numerous recent admissions and presentations to ED with seizures, just discharged from [facility] Feb 18, who presents to [facility] after he was found down at home.   Per pt he was doing heroin in the bathroom at home last night around 10-11pm. Reports that he collapsed in the bathroom and was in/out of consciousness all night.   His mother states that last night around 10 she heard him upstairs in his room and thought he was was with friends and watching TV. She went to bed. Heard groaning through the night a few times - thought it was the TV.   In am around 9 she went up and found him down in the bathroom. Was able to wake him up, but by 10-11 realized that he was not getting better and took him to ED via EMS.     Pt complaints of severe right arm and r thigh pain.     In ED found in florid renal failure with severe rhabdomyolysis and suspect compartment syndrome and directed for admission.       Past Medical History:        Diagnosis Date   ??? Migraine    ??? Psychiatric problem    ??? Seizures (HCC)        Past Surgical History:    History reviewed. No pertinent past surgical history.    Medications Prior to Admission:    Prior to Admission medications    Medication Sig Start Date End Date Taking? Authorizing Provider   levETIRAcetam (KEPPRA) 1000 MG tablet Take 1 tablet by mouth 2 times daily 06/27/15  Yes San Jetty, MD       Allergies:  Review of patient's allergies indicates no known  allergies.    Social History:  The patient currently lives at home.     TOBACCO:   reports that he has been smoking Cigarettes.  He has a 2.00 pack-year smoking history. He quit smokeless tobacco use about 21 months ago.  ETOH:   reports that he drinks alcohol.      Family History:  Reviewed in detail and negative for DM, Early CAD, Cancer, CVA. Positive as follows:        Problem Relation Age of Onset   ??? Diabetes Father        REVIEW OF SYSTEMS:   As noted in the HPI. All other systems reviewed and negative.    PHYSICAL EXAM:    Visit Vitals   ??? BP (!) 170/86   ??? Pulse 98   ??? Temp 98.1 ??F (36.7 ??C) (Oral)   ??? Resp 19   ??? Ht 6\' 4"  (1.93 m)   ??? Wt 176 lb 9.4 oz (80.1 kg)   ??? SpO2 100%   ??? BMI 21.5 kg/m2       General appearance: mod distress due to pain, appears stated age and cooperative.  HEENT Normal cephalic, atraumatic without obvious deformity.  Pupils equal, round, and reactive to light.  Extra ocular muscles intact.  Conjunctivae/corneas clear.  Neck: Supple, No jugular venous distention/bruits.  Trachea midline without thyromegaly or adenopathy with full range of motion.  Lungs: Clear to auscultation, bilaterally without Rales/Wheezes/Rhonchi with good respiratory effort.  Heart: Regular rate and rhythm with Normal S1/S2 without murmurs, rubs or gallops, point of maximum impulse non-displaced  Abdomen: Soft, non-tender or non-distended without rigidity or guarding and positive bowel sounds all four quadrants.  Extremities: R forearm and arm muscle groups tense and almost rigid. Able to move extremity, albeit with severe pain. R thigh rigid through the whole circumference. Pulses present and strong.   Skin: multiple areas of erythema R forehead, R arm and elbow area, right chest area erythema with blistering  Neurologic: Alert and oriented X 3, neurovascularly intact with sensory/motor intact upper extremities/lower extremities, bilaterally.  Cranial nerves: II-XII intact, grossly non-focal.  Mental status:  Alert, oriented, thought content appropriate. Thinks he is at [facility].   Capillary Refill: Acceptable  < 3 seconds  Peripheral Pulses: +3 Easily felt, not easily obliterated with pressure        CBC   Recent Labs      07/18/15   1215   WBC  17.6*   HGB  17.0   HCT  52.0   PLT  260      RENAL  Recent Labs      07/18/15   1215   NA  138   K  7.0*   CL  97*   CO2  17*   BUN  22*   CREATININE  2.6*     LFT'S  Recent Labs      07/18/15   1215   AST  2101*   ALT  362*   BILITOT  0.3   ALKPHOS  100     COAG  No results for input(s): INR in the last 72 hours.  CARDIAC ENZYMES  Recent Labs      07/18/15   1215   CKTOTAL  161096*       U/A:    Lab Results   Component Value Date    COLORU DK YELLOW 07/18/2015    WBCUA 2 06/28/2014    RBCUA 0 06/28/2014    MUCUS 1+ 09/16/2013    BACTERIA 1+ 02/14/2014    CLARITYU Clear 07/18/2015    SPECGRAV 1.020 07/18/2015    LEUKOCYTESUR Negative 07/18/2015    BLOODU LARGE 07/18/2015    GLUCOSEU Negative 07/18/2015    AMORPHOUS Present 08/17/2011       ABG    Lab Results   Component Value Date    HCO3ART 25.4 03/29/2014    BEART -0.5 03/29/2014    O2SATART 99.8 03/29/2014    PHART 7.356 03/29/2014    PCO2ART 46.4 03/29/2014    PO2ART 296.3 03/29/2014    TCO2ART 26.8 03/29/2014           PHYSICIANS CERTIFICATION:    I certify that Roy Weaver is expected to be hospitalized for more than 2 midnights based on the following assessment and plan:      ASSESSMENT/PLAN:    Drug Overdose  Drug screen pending.   Prior + for cocaine, opiates and cannabis.   Mental status: AAOx3 and protecting airway.   Cont supportive care.       Severe Rhabdomyolysis - CK 200K+ on admit.   In renal failure.   UA blood(+) and RBC (-) suggestive of myoglobinuria.   IVF with Bicarb 125cc/hr + NS at 75cc/hr - to run at 200cc total rate.  Trend CK.   Nephrology involved.       AKI due to above.   Urine output seems to be picking up with IVF.   Trend renal q4.   Nephrology involved.       Hyperkalemia - K 7 - no EKG  changes.   Treated urgently with bicarb, insulin and D50, IVF.   Trend K q4.   Will need dialysis if no improvement, albeit urine output improving - promising.       AGMA - LA, elevated BUN.   Will get salicylate and tylenol level.   Get plasma osm.   IVf hydration.       Abnormal LFT - transaminitis with normal bilirubin.   Likely drug toxic effect and rhabdo.   Has Hx of HepC - untreated.   Trend LFT.   Work up for toxins as above.   Consider imaging if no improvement.       Suspect compartment syndrome - RUE and R thigh.   ED physician reports normal compartment pressures measured in ED.   Ortho for R arm and Gen Surg for R thigh evaluation.       Seizure d/o - Keppra level actually normal this admit.   No reported seizure.   Keep in seizure precautions.    Cont keppra.       R Elbow Erythema - suspect due to pressure of prolonged immobility, but pt IV heroin user.   Will get MRSA DNA swab and cover with vanco pending results.       Acute toxic encephalopathy - due to drug overdose.   Improved.   CT head negative      Maxillary Sinusitis R - cover with unasyn.             DVT Prophylaxis: heparin.   Diet:  General as tolerates. Low K.   Code Status: Prior - full      Dispo - ICU. >15min of CC time provided due to risk of life threatening complications.        Bernette Mayers, MD    Thank you Baldwin Jamaica, MD for the opportunity to be involved in this patient's care. If you have any questions or concerns please feel free to contact me at [phone].

## 2015-07-18 NOTE — ED Notes (Signed)
Patient resting in bed no signs of distress.  Per patient he states he took heroin last night (unsure if this was through peripheral or snorted), and then he "went down".  Patient was found this AM by mother on the ground in the bathroom.      At this time patient opens eyes spontaneously, answers questions appropriately.  Patient is unable to move right side well at this time, but does wiggle fingers and toes.  Swelling noted to right thigh.  Wounds to right forearm/AC and wound to right chest wall are noted consistant with skin breakdown.      Patient on continuous telemetry and pulse oximetry.     Cordelia Pen, RN  07/18/15 1453

## 2015-07-18 NOTE — ED Notes (Signed)
Transfer to ICU, report at bedside to admission ICU RN Morrie Sheldon.     Cordelia Pen, RN  07/18/15 1733

## 2015-07-18 NOTE — Progress Notes (Addendum)
1500: Dr. Charm Barges aware of pending admission   1535: Dr. Lodema Hong called for update on patient. Orders for STAT BMP when pt arrives to unit  1545: Pt to ICU. Bedside report from [place], [place]. Pt A&Ox4. Rating pain 8/10 from right shoulder. Transferred over into bed; placed on monitors. Right arm and right thigh/leg noticeably swollen/red/cool to touch; dusky fingers & toes. Pt unable to wiggle toes/move right leg. Pt has blisters on right outer chest area.   1615: Extremity circumference measured and updated on white board in patient room and in progress note  1650: Dr. Lodema Hong @ bedside. Updated on current labs  1705: Dr. Benancio Deeds re-paged at this time per Dr. Lodema Hong request  1710: Dr. Benancio Deeds stating vascular surgery should be consulted. Orders to follow per Dr. Lodema Hong verbal order   1723: Dr. Lodema Hong speaking with vascular surgery @ this time.   1728: Dr. Lavera Guise paged at this time to review plan of care  1738: Per Dr. Lavera Guise; orthopedic surgery has been consulted for the right upper extremity. General surgery or vascular surgery needs to come see patient ASAP to assess right lower extremity. Dr. Benancio Deeds re-paged @ this time  1743: Per Dr. Benancio Deeds vascular surgery needs to see the patient not General Surgery   1750: Dr. Su Hoff @ bedside   1754: Dr. Benancio Deeds to call Vascular Surgery   1807: Dr. Ramond Dial from vascular surgery to come see patient tonight  1813: Dr. Lavera Guise paged. Patient rating pain 8/10   1820: Dr. Lavera Guise returned call. Pt unable to receive anything for pain at this time. Orders to call 2000 labs to hospitalist to re-evaluate   1858: Dr. Ramond Dial @ bedside. Pt going to surgery   1900: Consent signed  1905: Mother contacted per this RN per patient request. Questions answered   1913: Dr. Lodema Hong ok with Dr. Ramond Dial to place [facility] while in surgery. Consent signed  1919: Pt to OR @ this time per OR staff  Electronically signed by Edd Fabian, RN on 07/18/2015 at 7:19 PM

## 2015-07-18 NOTE — Op Note (Signed)
[place]  [id number]                                   OPERATIVE REPORT    PATIENT NAME:  Roy Weaver, Roy J.                DOB:      [birth year]  MED REC NO:    [id number]                       ROOM:     [id number]  ACCOUNT NO:    [id number]                       ADMISSION DATE: 07/18/2015  PHYSICIAN:     Merlinda Frederick. Pleas Patricia, MD                   DATE OF PROCEDURE:  07/18/2015    PREOPERATIVE DIAGNOSES:    1.  Compartment syndrome of the right lower extremity (nontraumatic).  2.  Rhabdomyolysis with acute kidney injury.    POSTOPERATIVE DIAGNOSES:    1.  Compartment syndrome of the right lower extremity (nontraumatic).  2.  Rhabdomyolysis with acute kidney injury.    PROCEDURE:    1.  Fasciotomy of the right thigh, decompressing the medial, posterior and  lateral compartments.  2.  The anterior and lateral lower leg fasciotomies.  3.  Ultrasound-guided right internal jugular venous access.  4.  Placement of non-tunneled dialysis catheter using fluoroscopic guidance.    ANESTHESIA:  General.    INDICATIONS:  The patient is a 30 year old male who is a polysubstance abuser  found lying on the bathroom floor for greater than 12 hours.  He has developed  significant rhabdomyolysis with a very tense thigh and right lower leg with  evidence of acute kidney injury.  The patient is brought to the operating room  for the above procedures.    DESCRIPTION OF PROCEDURE:  The patient was brought to the operating room and  placed in supine position.  General endotracheal anesthesia induced.  After  adequate anesthesia, the right lower extremity was prepped and draped in  sterile fashion.  A medial incision was made in the skin using cautery carried  down to the fascia.  The fascia was incised medially, and the muscles were  extremely taut, bulging through the skin with massive amounts of edema.  It  was necessary to extend the incision  very proximally as these muscles were  very tight including the adductor muscles.  An incision was then made in the  lateral thigh, and a fasciotomy was also performed of the lateral compartment  incising the iliotibial band.  Posterior fasciotomy was performed through this  incision as well.  It was necessary, as well, to extend this incision well up  proximally, as well as distally down to the knee.  Once this was performed,  the muscles were massively bulging through the tissues and the muscles did  appear to be viable.  Hemostasis was obtained of small bleeding subcutaneous  vessels using cautery.  Next, an incision was made in the right lower leg over  the anterior and lateral compartments.  The anterior fascia was incised, and  the lateral fascia was  incised as well.  The muscles here were slightly tense,  but not any degree like the thigh muscles.  The posterior compartments did  appear soft and, therefore, no posterior compartment fasciotomies were  performed.  These wounds were dressed with saline-soaked Kerlix and then  wrapped with ABDs and dry Kerlix.  Next, the right neck region was prepped and  draped in sterile fashion.  Gowns and gloves were changed.  Using ultrasound  guidance and a sterilely prepped ultrasound probe, the right internal jugular  vein was easily identified and accessed.  A guidewire was passed into the  superior vena cava using fluoroscopic guidance.  The skin and subcutaneous  tissue tracts were dilated over the wire using the supplied dilators in the  temporary dialysis catheter kit.  With the tract dilated, a 20 cm temporary  dialysis catheter was advanced over the wire down to the level of the right  atrium.  The ports aspirated venous blood quite easily and were flushed with  saline solution and then blocked with heparin solution.  The catheter was  secured to the skin using 2-0 nylon sutures.  The patient remained intubated  in the operating room for assessment by orthopedic  surgery for possible right  upper extremity fasciotomies.  The patient will remain intubated and will be  transferred to the intensive care unit subsequently.        Merlinda Frederick. Pleas Patricia, MD      D: 07/18/2015 21:17:54  T: 07/18/2015 22:03:21  TMB/nts  Job#: [id number]  Doc#: [id number]

## 2015-07-18 NOTE — Consults (Signed)
30 y/o AAM with h/o substance abuse, seizure disorder found down for about 12 hours  CPK 260 K  Initial K+ 7 MEQ   Treated medically  UOP 330 ml so far K+ down to 4.7 meq    IMPRESSION    AKI due to Rhabdomyolysis, non oliguric    Hyperkalemia treated, monitor closely for rebound    Rhabdo appears to have compartment syndrome R arm and RLE    Substance Abuse      REC    IVF's with [facility]    Monitor K= and renal function closely, serial CPK levels    No indication for dialysis at this time    He is aware renal function may worsen to the point of needing dialysis    Surgery and Ortho consulted

## 2015-07-18 NOTE — Progress Notes (Signed)
1.  Patient is identified using name and date of birth.  2.  The patient is free from signs and symptoms of injury.  3.  The patient receives appropriate medication(s), safely administered during the perioperative period.  4.  The patient had wound/tissuue perfusion consistent with or improved from baseline levels established preoperatively.  5.  The patient is at or returning to normothermia at the conclusion of the immediate postoperative period.  6.  The patient's fluid, electrolyte, and acid base balances are consistent with or improved from baseline levels established preoperatively.  7.  The patient's pulmonary function is consistent with or improved from baseline levels established preoperatively.  8.  The patient's cardiovascular status is consistent with or improved from baseline levels established preoperatively.  9.  The patient/caregiver participates in decisions affecting his or her perioperative care.  10.  The patient's care is consistent with the individualized perioperative plan of care.  11.  The patient's right to privacy is maintained.  12.  The patient is the recipient of competent and ethical care within legal standards of practice.  13.  The patient's value system, lifestyle, ethnicity, and culture are considered, respected, and incorporated in the perioperative plan of care.  14.  The patient demonstrates and/or reports adequate pain control throughout the perioperative period.  15.  The patient's neurological status is consistent with or improved from baseline levels established preoperatively.  16.  The patient/caregiver demonstrates knowledge of the expected responses to the operative or invasive procedure.  17.  Patient/caregiver has reduced anxiety.  Interventions - familiarize with environment and equipment.

## 2015-07-18 NOTE — Progress Notes (Signed)
Propofol sedation started per ICU RN

## 2015-07-18 NOTE — ED Notes (Signed)
Assuming care for break from TW RN     Cordelia Pen, RN  07/18/15 1441

## 2015-07-18 NOTE — Brief Op Note (Signed)
Brief Postoperative Note    Roy Weaver  Date of Birth:  23-Jan-1986  [id number]    Pre-operative Diagnosis: RLE Compartment syndrome    Post-operative Diagnosis: Same    Procedure: Anterior, medial, and posterior thigh fasciotomy,  Anterior and lateral lower leg fasciotomy   Placement of RIJ Temp dialysis Catheter using ultrasound guidance and fluoro.    Anesthesia: General    Surgeons/Assistants: Annsleigh Dragoo, MD    Estimated Blood Loss: less than 50     Complications: None      Electronically signed by Marylou Mccoy, MD on 07/18/2015 at 8:37 PM

## 2015-07-18 NOTE — ED Notes (Signed)
Bed: B-05  Expected date: 07/18/15  Expected time: 11:35 AM  Means of arrival:   Comments:  31 y.o. Syncope ? Seizure?     Wilford Grist, RN  07/18/15 1149

## 2015-07-19 ENCOUNTER — Inpatient Hospital Stay: Admit: 2015-07-19 | Primary: Internal Medicine

## 2015-07-19 LAB — CBC WITH AUTO DIFFERENTIAL
Basophils %: 0.2 %
Basophils Absolute: 0 10*3/uL (ref 0.0–0.2)
Eosinophils %: 0 %
Eosinophils Absolute: 0 10*3/uL (ref 0.0–0.6)
Hematocrit: 52.6 % — ABNORMAL HIGH (ref 40.5–52.5)
Hemoglobin: 17.4 g/dL (ref 13.5–17.5)
Lymphocytes %: 7.4 %
Lymphocytes Absolute: 1.3 10*3/uL (ref 1.0–5.1)
MCH: 29.8 pg (ref 26.0–34.0)
MCHC: 33.2 g/dL (ref 31.0–36.0)
MCV: 89.7 fL (ref 80.0–100.0)
MPV: 8.2 fL (ref 5.0–10.5)
Monocytes %: 6.3 %
Monocytes Absolute: 1.1 10*3/uL (ref 0.0–1.3)
Neutrophils %: 86.1 %
Neutrophils Absolute: 15 10*3/uL — ABNORMAL HIGH (ref 1.7–7.7)
Platelets: 205 10*3/uL (ref 135–450)
RBC: 5.86 M/uL (ref 4.20–5.90)
RDW: 13.2 % (ref 12.4–15.4)
WBC: 17.4 10*3/uL — ABNORMAL HIGH (ref 4.0–11.0)

## 2015-07-19 LAB — COMPREHENSIVE METABOLIC PANEL
ALT: 235 U/L — ABNORMAL HIGH (ref 10–40)
ALT: 304 U/L — ABNORMAL HIGH (ref 10–40)
AST: 1090 U/L — ABNORMAL HIGH (ref 15–37)
AST: 1463 U/L — ABNORMAL HIGH (ref 15–37)
Albumin/Globulin Ratio: 0.8 — ABNORMAL LOW (ref 1.1–2.2)
Albumin/Globulin Ratio: 0.7 — ABNORMAL LOW (ref 1.1–2.2)
Albumin/Globulin Ratio: 0.9 — ABNORMAL LOW (ref 1.1–2.2)
Albumin: 2.1 g/dL — ABNORMAL LOW (ref 3.4–5.0)
Albumin: 3.2 g/dL — ABNORMAL LOW (ref 3.4–5.0)
Alkaline Phosphatase: 75 U/L (ref 40–129)
Alkaline Phosphatase: 99 U/L (ref 40–129)
Anion Gap: 14 (ref 3–16)
Anion Gap: 13 (ref 3–16)
Anion Gap: 13 (ref 3–16)
BUN: 29 mg/dL — ABNORMAL HIGH (ref 7–20)
BUN: 29 mg/dL — ABNORMAL HIGH (ref 7–20)
BUN: 13 mg/dL (ref 7–20)
CO2: 21 mmol/L (ref 21–32)
CO2: 22 mmol/L (ref 21–32)
CO2: 25 mmol/L (ref 21–32)
Calcium: 6.4 mg/dL — ABNORMAL LOW (ref 8.3–10.6)
Calcium: 6.6 mg/dL — ABNORMAL LOW (ref 8.3–10.6)
Calcium: 8 mg/dL — ABNORMAL LOW (ref 8.3–10.6)
Chloride: 99 mmol/L (ref 99–110)
Chloride: 96 mmol/L — ABNORMAL LOW (ref 99–110)
Chloride: 94 mmol/L — ABNORMAL LOW (ref 99–110)
Creatinine: 4 mg/dL — ABNORMAL HIGH (ref 0.9–1.3)
Creatinine: 4.6 mg/dL — ABNORMAL HIGH (ref 0.9–1.3)
Creatinine: 1.7 mg/dL — ABNORMAL HIGH (ref 0.9–1.3)
GFR African American: 22 — AB (ref >60)
GFR African American: 18 — AB (ref >60)
GFR African American: 58 — AB (ref >60)
GFR Non-African American: 18 — AB (ref >60)
GFR Non-African American: 15 — AB (ref >60)
GFR Non-African American: 48 — AB (ref >60)
Globulin: 3.2 g/dL
Globulin: 2.9 g/dL
Globulin: 3.6 g/dL
Glucose: 129 mg/dL — ABNORMAL HIGH (ref 70–99)
Glucose: 160 mg/dL — ABNORMAL HIGH (ref 70–99)
Glucose: 121 mg/dL — ABNORMAL HIGH (ref 70–99)
Potassium: 5 mmol/L (ref 3.5–5.1)
Potassium: 4.5 mmol/L (ref 3.5–5.1)
Potassium: 4.2 mmol/L (ref 3.5–5.1)
Sodium: 134 mmol/L — ABNORMAL LOW (ref 136–145)
Sodium: 131 mmol/L — ABNORMAL LOW (ref 136–145)
Sodium: 132 mmol/L — ABNORMAL LOW (ref 136–145)
Total Bilirubin: <0.2 mg/dL (ref 0.0–1.0)
Total Bilirubin: 0.4 mg/dL (ref 0.0–1.0)
Total Protein: 5 g/dL — ABNORMAL LOW (ref 6.4–8.2)
Total Protein: 6.8 g/dL (ref 6.4–8.2)

## 2015-07-19 LAB — RENAL FUNCTION PANEL
Albumin: 2.8 g/dL — ABNORMAL LOW (ref 3.4–5.0)
Albumin: 2.5 g/dL — ABNORMAL LOW (ref 3.4–5.0)
Albumin: 2.4 g/dL — ABNORMAL LOW (ref 3.4–5.0)
Anion Gap: 13 (ref 3–16)
Anion Gap: 13 (ref 3–16)
Anion Gap: 14 (ref 3–16)
BUN: 31 mg/dL — ABNORMAL HIGH (ref 7–20)
BUN: 28 mg/dL — ABNORMAL HIGH (ref 7–20)
BUN: 27 mg/dL — ABNORMAL HIGH (ref 7–20)
CO2: 22 mmol/L (ref 21–32)
CO2: 21 mmol/L (ref 21–32)
CO2: 21 mmol/L (ref 21–32)
Calcium: 6.2 mg/dL — ABNORMAL LOW (ref 8.3–10.6)
Calcium: 6.4 mg/dL — ABNORMAL LOW (ref 8.3–10.6)
Calcium: 6.6 mg/dL — ABNORMAL LOW (ref 8.3–10.6)
Chloride: 102 mmol/L (ref 99–110)
Chloride: 98 mmol/L — ABNORMAL LOW (ref 99–110)
Chloride: 97 mmol/L — ABNORMAL LOW (ref 99–110)
Creatinine: 3.9 mg/dL — ABNORMAL HIGH (ref 0.9–1.3)
Creatinine: 4 mg/dL — ABNORMAL HIGH (ref 0.9–1.3)
Creatinine: 3.9 mg/dL — ABNORMAL HIGH (ref 0.9–1.3)
GFR African American: 22 — AB (ref >60)
GFR African American: 22 — AB (ref >60)
GFR African American: 22 — AB (ref >60)
GFR Non-African American: 18 — AB (ref >60)
GFR Non-African American: 18 — AB (ref >60)
GFR Non-African American: 18 — AB (ref >60)
Glucose: 136 mg/dL — ABNORMAL HIGH (ref 70–99)
Glucose: 129 mg/dL — ABNORMAL HIGH (ref 70–99)
Glucose: 129 mg/dL — ABNORMAL HIGH (ref 70–99)
Phosphorus: 3.7 mg/dL (ref 2.5–4.9)
Phosphorus: 3.6 mg/dL (ref 2.5–4.9)
Phosphorus: 4.1 mg/dL (ref 2.5–4.9)
Potassium: 6.5 mmol/L (ref 3.5–5.1)
Potassium: 5 mmol/L (ref 3.5–5.1)
Potassium: 5 mmol/L (ref 3.5–5.1)
Sodium: 137 mmol/L (ref 136–145)
Sodium: 132 mmol/L — ABNORMAL LOW (ref 136–145)
Sodium: 132 mmol/L — ABNORMAL LOW (ref 136–145)

## 2015-07-19 LAB — MAGNESIUM
Magnesium: 2.5 mg/dL — ABNORMAL HIGH (ref 1.80–2.40)
Magnesium: 2.2 mg/dL (ref 1.80–2.40)

## 2015-07-19 LAB — BLOOD GAS, ARTERIAL
Base Excess, Arterial: -5.1 mmol/L — ABNORMAL LOW (ref <=3.0)
Base Excess, Arterial: -4.8 mmol/L — ABNORMAL LOW (ref <=3.0)
Carboxyhgb, Arterial: 0.1 % (ref 0.0–1.5)
Carboxyhgb, Arterial: 0.3 % (ref 0.0–1.5)
HCO3, Arterial: 20.3 mmol/L — ABNORMAL LOW (ref 21.0–29.0)
HCO3, Arterial: 20.1 mmol/L — ABNORMAL LOW (ref 21.0–29.0)
Hemoglobin, Art, Extended: 17.5 g/dL (ref 13.5–17.5)
Hemoglobin, Art, Extended: 17.3 g/dL (ref 13.5–17.5)
Methemoglobin, Arterial: 0.7 % (ref <1.5)
Methemoglobin, Arterial: 0.2 % (ref <1.5)
O2 Content, Arterial: 25 mL/dL
O2 Content, Arterial: 25 mL/dL
O2 Sat, Arterial: 99.4 % (ref >92)
O2 Sat, Arterial: 99.6 % (ref >92)
TCO2, Arterial: 21.6 mmol/L
TCO2, Arterial: 21.2 mmol/L
pCO2, Arterial: 40.7 mmHg (ref 35.0–45.0)
pCO2, Arterial: 38.3 mmHg (ref 35.0–45.0)
pH, Arterial: 7.32 — ABNORMAL LOW (ref 7.350–7.450)
pH, Arterial: 7.339 — ABNORMAL LOW (ref 7.350–7.450)
pO2, Arterial: 388 mmHg — ABNORMAL HIGH (ref 75.0–108.0)
pO2, Arterial: 238 mmHg — ABNORMAL HIGH (ref 75.0–108.0)

## 2015-07-19 LAB — HEPATIC FUNCTION PANEL
ALT: 327 U/L — ABNORMAL HIGH (ref 10–40)
AST: 1525 U/L — ABNORMAL HIGH (ref 15–37)
Albumin: 2.5 g/dL — ABNORMAL LOW (ref 3.4–5.0)
Alkaline Phosphatase: 89 U/L (ref 40–129)
Bilirubin, Direct: <0.2 mg/dL (ref 0.0–0.3)
Total Bilirubin: <0.2 mg/dL (ref 0.0–1.0)
Total Protein: 5.7 g/dL — ABNORMAL LOW (ref 6.4–8.2)

## 2015-07-19 LAB — CALCIUM, IONIZED
Calcium, Ionized: 0.91 mmol/L — ABNORMAL LOW (ref 1.12–1.32)
Calcium, Ionized: 0.93 mmol/L — ABNORMAL LOW (ref 1.12–1.32)
pH, Ven: 7.27 — ABNORMAL LOW (ref 7.35–7.45)
pH, Ven: 7.29 — ABNORMAL LOW (ref 7.35–7.45)

## 2015-07-19 LAB — LACTIC ACID
Lactic Acid: 2 mmol/L (ref 0.4–2.0)
Lactic Acid: 1.7 mmol/L (ref 0.4–2.0)

## 2015-07-19 LAB — CK
Total CK: 262400 U/L — ABNORMAL HIGH (ref 39–308)
Total CK: 196226 U/L — ABNORMAL HIGH (ref 39–308)

## 2015-07-19 LAB — PHOSPHORUS
Phosphorus: 4.2 mg/dL (ref 2.5–4.9)
Phosphorus: 5.2 mg/dL — ABNORMAL HIGH (ref 2.5–4.9)

## 2015-07-19 LAB — APTT: aPTT: 70.3 s — ABNORMAL HIGH (ref 21.0–31.8)

## 2015-07-19 LAB — MRSA DNA PROBE, NASAL: MRSA SCREEN RT-PCR: NEGATIVE

## 2015-07-19 LAB — VANCOMYCIN LEVEL, RANDOM: Vancomycin Rm: 13.5 ug/mL

## 2015-07-19 MED ORDER — CALCIUM GLUCONATE 10 % IV SOLN
10 % | INTRAVENOUS | Status: DC | PRN
Start: 2015-07-19 — End: 2015-07-21
  Administered 2015-07-19 – 2015-07-21 (×9): 1 g via INTRAVENOUS

## 2015-07-19 MED ORDER — SODIUM CHLORIDE 0.9 % IV SOLN
0.9 % | INTRAVENOUS | Status: AC
Start: 2015-07-19 — End: 2015-07-19
  Administered 2015-07-19: 08:00:00 1000

## 2015-07-19 MED ORDER — PROMETHAZINE HCL 25 MG/ML IJ SOLN
25 MG/ML | Freq: Once | INTRAMUSCULAR | Status: AC | PRN
Start: 2015-07-19 — End: 2015-07-18

## 2015-07-19 MED ORDER — SODIUM CHLORIDE 0.9 % IV SOLN
0.9 % | INTRAVENOUS | Status: DC
Start: 2015-07-19 — End: 2015-07-21
  Administered 2015-07-19 – 2015-07-21 (×9): via INTRAVENOUS

## 2015-07-19 MED ORDER — ALTEPLASE 2 MG IJ SOLR
2 MG | Freq: Once | INTRAMUSCULAR | Status: AC | PRN
Start: 2015-07-19 — End: 2015-07-19

## 2015-07-19 MED ORDER — HYDROMORPHONE HCL 1 MG/ML IJ SOLN
1 | INTRAMUSCULAR | Status: AC
Start: 2015-07-19 — End: 2015-07-18

## 2015-07-19 MED ORDER — DEXTROSE 5 % IV SOLN (MINI-BAG)
5 % | Freq: Once | INTRAVENOUS | Status: DC
Start: 2015-07-19 — End: 2015-07-19

## 2015-07-19 MED ORDER — OXYCODONE-ACETAMINOPHEN 5-325 MG PO TABS
5-325 MG | ORAL | Status: DC | PRN
Start: 2015-07-19 — End: 2015-07-29
  Administered 2015-07-19 – 2015-07-23 (×6): 1 via ORAL

## 2015-07-19 MED ORDER — DEXTROSE 5 % IV SOLN
5 | INTRAVENOUS | Status: DC | PRN
Start: 2015-07-19 — End: 2015-07-21

## 2015-07-19 MED ORDER — ROCURONIUM BROMIDE 50 MG/5ML IV SOLN
50 | INTRAVENOUS | Status: AC
Start: 2015-07-19 — End: 2015-07-18

## 2015-07-19 MED ORDER — HYDROMORPHONE 0.5MG/0.5ML IJ SOLN
1 MG/ML | Status: DC | PRN
Start: 2015-07-19 — End: 2015-07-19

## 2015-07-19 MED ORDER — PRISMASATE BGK 4/2.5 DIALYSIS SOLUTION
INTRAVENOUS_CENTRAL | Status: DC
Start: 2015-07-19 — End: 2015-07-21
  Administered 2015-07-19 (×3): 2000 via INTRAVENOUS_CENTRAL
  Administered 2015-07-19: 08:00:00 1000 via INTRAVENOUS_CENTRAL
  Administered 2015-07-20 (×2): 2000 via INTRAVENOUS_CENTRAL
  Administered 2015-07-20: 22:00:00 1500 via INTRAVENOUS_CENTRAL
  Administered 2015-07-20 (×4): 2000 via INTRAVENOUS_CENTRAL
  Administered 2015-07-21 (×6): 1500 via INTRAVENOUS_CENTRAL

## 2015-07-19 MED ORDER — SODIUM PHOSPHATE 3 MMOLE/ML IV SOLN
3 MMOLE/ML | INTRAVENOUS | Status: DC | PRN
Start: 2015-07-19 — End: 2015-07-21

## 2015-07-19 MED ORDER — HEPARIN SOD (PORK) LOCK FLUSH 100 UNIT/ML IV SOLN
100 | INTRAVENOUS | Status: AC
Start: 2015-07-19 — End: 2015-07-18

## 2015-07-19 MED ORDER — HYDROMORPHONE HCL 2 MG PO TABS
2 MG | ORAL | Status: DC | PRN
Start: 2015-07-19 — End: 2015-07-20
  Administered 2015-07-19: 22:00:00 0.5 mg via ORAL

## 2015-07-19 MED ORDER — DEXTROSE 5 % IV SOLN
5 % | Freq: Once | INTRAVENOUS | Status: AC
Start: 2015-07-19 — End: 2015-07-19
  Administered 2015-07-19: 16:00:00 1250 mg via INTRAVENOUS

## 2015-07-19 MED ORDER — NORMAL SALINE FLUSH 0.9 % IV SOLN
0.9 % | Freq: Two times a day (BID) | INTRAVENOUS | Status: DC
Start: 2015-07-19 — End: 2015-07-29
  Administered 2015-07-19 – 2015-07-29 (×17): 10 mL via INTRAVENOUS

## 2015-07-19 MED ORDER — MIDAZOLAM HCL 2 MG/2ML IJ SOLN
2 | INTRAMUSCULAR | Status: AC
Start: 2015-07-19 — End: 2015-07-18

## 2015-07-19 MED ORDER — ONDANSETRON HCL 4 MG/2ML IJ SOLN
4 MG/2ML | Freq: Four times a day (QID) | INTRAMUSCULAR | Status: DC | PRN
Start: 2015-07-19 — End: 2015-07-29
  Administered 2015-07-27: 04:00:00 4 mg via INTRAVENOUS

## 2015-07-19 MED ORDER — DOCUSATE SODIUM 100 MG PO CAPS
100 MG | Freq: Two times a day (BID) | ORAL | Status: DC
Start: 2015-07-19 — End: 2015-07-29
  Administered 2015-07-20 – 2015-07-29 (×17): 100 mg via ORAL

## 2015-07-19 MED ORDER — PRISMASATE BGK 4/2.5 DIALYSIS SOLUTION
INTRAVENOUS_CENTRAL | Status: DC
Start: 2015-07-19 — End: 2015-07-21
  Administered 2015-07-19 – 2015-07-21 (×10): 1000 via INTRAVENOUS_CENTRAL

## 2015-07-19 MED ORDER — PHENYLEPHRINE HCL 10 MG/ML IJ SOLN
10 | INTRAMUSCULAR | Status: AC
Start: 2015-07-19 — End: 2015-07-18

## 2015-07-19 MED ORDER — FENTANYL CITRATE (PF) 100 MCG/2ML IJ SOLN
100 MCG/2ML | INTRAMUSCULAR | Status: DC | PRN
Start: 2015-07-19 — End: 2015-07-19

## 2015-07-19 MED ORDER — PROPOFOL 1000 MG/100ML IV EMUL
1000 MG/100ML | INTRAVENOUS | Status: DC
Start: 2015-07-19 — End: 2015-07-20

## 2015-07-19 MED ORDER — LEVETIRACETAM 500 MG PO TABS
500 MG | Freq: Two times a day (BID) | ORAL | Status: DC
Start: 2015-07-19 — End: 2015-07-21
  Administered 2015-07-20 – 2015-07-22 (×5): 500 mg via ORAL

## 2015-07-19 MED ORDER — CEFAZOLIN 2000 MG IN D5W 100 ML IVPB
Freq: Three times a day (TID) | Status: DC
Start: 2015-07-19 — End: 2015-07-19

## 2015-07-19 MED ORDER — HEPARIN SODIUM (PORCINE) 1000 UNIT/ML IJ SOLN
1000 UNIT/ML | Freq: Once | INTRAMUSCULAR | Status: AC | PRN
Start: 2015-07-19 — End: 2015-07-19

## 2015-07-19 MED ORDER — PROPOFOL 1000 MG/100ML IV EMUL
1000 MG/100ML | INTRAVENOUS | Status: AC
Start: 2015-07-19 — End: 2015-07-18
  Administered 2015-07-19: 05:00:00 10 via INTRAVENOUS

## 2015-07-19 MED ORDER — CHLORHEXIDINE GLUCONATE 0.12 % MT SOLN
0.12 % | Freq: Two times a day (BID) | OROMUCOSAL | Status: DC
Start: 2015-07-19 — End: 2015-07-19
  Administered 2015-07-19: 14:00:00 15 mL via OROMUCOSAL

## 2015-07-19 MED ORDER — POTASSIUM CHLORIDE 20 MEQ/50ML IV SOLN
20 MEQ/50ML | INTRAVENOUS | Status: DC | PRN
Start: 2015-07-19 — End: 2015-07-21

## 2015-07-19 MED ORDER — HEPARIN SODIUM (PORCINE) 5000 UNIT/ML IJ SOLN
5000 | INTRAMUSCULAR | Status: AC
Start: 2015-07-19 — End: 2015-07-18

## 2015-07-19 MED ORDER — SODIUM CHLORIDE 0.9 % IV BOLUS
0.9 % | Freq: Once | INTRAVENOUS | Status: AC | PRN
Start: 2015-07-19 — End: 2015-07-19

## 2015-07-19 MED ORDER — FAMOTIDINE 20 MG/2ML IV SOLN
20 MG/2ML | Freq: Two times a day (BID) | INTRAVENOUS | Status: DC
Start: 2015-07-19 — End: 2015-07-19
  Administered 2015-07-19: 13:00:00 20 mg via INTRAVENOUS

## 2015-07-19 MED ORDER — PRISMASATE BGK 4/2.5 DIALYSIS SOLUTION
INTRAVENOUS_CENTRAL | Status: DC
Start: 2015-07-19 — End: 2015-07-21
  Administered 2015-07-19 – 2015-07-20 (×5): 2000 via INTRAVENOUS_CENTRAL
  Administered 2015-07-20 (×2): 2500 via INTRAVENOUS_CENTRAL
  Administered 2015-07-20 (×5): 2000 via INTRAVENOUS_CENTRAL
  Administered 2015-07-21 (×7): 2500 via INTRAVENOUS_CENTRAL

## 2015-07-19 MED ORDER — FAMOTIDINE 20 MG PO TABS
20 MG | Freq: Two times a day (BID) | ORAL | Status: DC
Start: 2015-07-19 — End: 2015-07-19

## 2015-07-19 MED ORDER — FENTANYL CITRATE (PF) 100 MCG/2ML IJ SOLN
100 MCG/2ML | INTRAMUSCULAR | Status: DC | PRN
Start: 2015-07-19 — End: 2015-07-19
  Administered 2015-07-19: 08:00:00 50 ug via INTRAVENOUS

## 2015-07-19 MED ORDER — CALCIUM GLUCONATE 10 % IV SOLN
10 | INTRAVENOUS | Status: DC | PRN
Start: 2015-07-19 — End: 2015-07-21

## 2015-07-19 MED ORDER — PHENYLEPHRINE HCL-NACL (PF) 1-0.9 MG/10ML-% IV SOSY
1-0.9 | INTRAVENOUS | Status: AC
Start: 2015-07-19 — End: 2015-07-18

## 2015-07-19 MED ORDER — DEXTROSE 5 % IV SOLN
5 % | INTRAVENOUS | Status: DC | PRN
Start: 2015-07-19 — End: 2015-07-21

## 2015-07-19 MED ORDER — MAGNESIUM SULFATE IN D5W 10-5 MG/ML-% IV SOLN
10-5 MG/ML-% | INTRAVENOUS | Status: DC | PRN
Start: 2015-07-19 — End: 2015-07-21

## 2015-07-19 MED ORDER — SODIUM CHLORIDE 0.9 % IV SOLN
0.9 % | INTRAVENOUS | Status: DC
Start: 2015-07-19 — End: 2015-07-21
  Administered 2015-07-19 – 2015-07-21 (×6): 25 mL/h via INTRAVENOUS_CENTRAL

## 2015-07-19 MED ORDER — ACETAMINOPHEN 325 MG PO TABS
325 MG | ORAL | Status: DC | PRN
Start: 2015-07-19 — End: 2015-07-29
  Administered 2015-07-25: 03:00:00 650 mg via ORAL

## 2015-07-19 MED ORDER — MAGNESIUM HYDROXIDE 400 MG/5ML PO SUSP
400 MG/5ML | Freq: Every day | ORAL | Status: DC | PRN
Start: 2015-07-19 — End: 2015-07-19

## 2015-07-19 MED ORDER — FAMOTIDINE 20 MG PO TABS
20 MG | Freq: Every day | ORAL | Status: DC
Start: 2015-07-19 — End: 2015-07-19

## 2015-07-19 MED ORDER — SODIUM CHLORIDE 0.45 % IV SOLN
0.45 % | INTRAVENOUS | Status: DC
Start: 2015-07-19 — End: 2015-07-19

## 2015-07-19 MED ORDER — NORMAL SALINE FLUSH 0.9 % IV SOLN
0.9 % | INTRAVENOUS | Status: DC | PRN
Start: 2015-07-19 — End: 2015-07-29

## 2015-07-19 MED ORDER — OXYCODONE-ACETAMINOPHEN 5-325 MG PO TABS
5-325 MG | ORAL | Status: DC | PRN
Start: 2015-07-19 — End: 2015-07-29
  Administered 2015-07-20 – 2015-07-29 (×22): 2 via ORAL

## 2015-07-19 MED ORDER — LABETALOL HCL 5 MG/ML IV SOLN
5 MG/ML | INTRAVENOUS | Status: DC | PRN
Start: 2015-07-19 — End: 2015-07-19

## 2015-07-19 MED ORDER — VASOPRESSIN 20 UNIT/ML IV SOLN
20 | INTRAVENOUS | Status: AC
Start: 2015-07-19 — End: 2015-07-18

## 2015-07-19 MED FILL — PHENYLEPHRINE HCL-NACL (PF) 1-0.9 MG/10ML-% IV SOSY: INTRAVENOUS | Qty: 10

## 2015-07-19 MED FILL — SODIUM BICARBONATE 8.4 % IV SOLN: 8.4 % | INTRAVENOUS | Qty: 75

## 2015-07-19 MED FILL — MIDAZOLAM HCL 2 MG/2ML IJ SOLN: 2 MG/ML | INTRAMUSCULAR | Qty: 2

## 2015-07-19 MED FILL — PRISMASATE BGK 4/2.5 DIALYSIS SOLUTION: INTRAVENOUS_CENTRAL | Qty: 5000

## 2015-07-19 MED FILL — AMPICILLIN-SULBACTAM SODIUM 1.5 (1-0.5) G IJ SOLR: 1.5 (1-0.5) g | INTRAMUSCULAR | Qty: 1.5

## 2015-07-19 MED FILL — HEPARIN SODIUM (PORCINE) 5000 UNIT/ML IJ SOLN: 5000 UNIT/ML | INTRAMUSCULAR | Qty: 1

## 2015-07-19 MED FILL — HYDROMORPHONE HCL 2 MG PO TABS: 2 MG | ORAL | Qty: 1

## 2015-07-19 MED FILL — LEVETIRACETAM 500 MG PO TABS: 500 MG | ORAL | Qty: 2

## 2015-07-19 MED FILL — CHLORHEXIDINE GLUCONATE 0.12 % MT SOLN: 0.12 % | OROMUCOSAL | Qty: 15

## 2015-07-19 MED FILL — FAMOTIDINE 20 MG/2ML IV SOLN: 20 MG/2ML | INTRAVENOUS | Qty: 2

## 2015-07-19 MED FILL — OXYCODONE-ACETAMINOPHEN 5-325 MG PO TABS: 5-325 MG | ORAL | Qty: 1

## 2015-07-19 MED FILL — CALCIUM GLUCONATE 10 % IV SOLN: 10 % | INTRAVENOUS | Qty: 10

## 2015-07-19 MED FILL — ROCURONIUM BROMIDE 50 MG/5ML IV SOLN: 50 MG/5ML | INTRAVENOUS | Qty: 5

## 2015-07-19 MED FILL — HEPARIN SODIUM (PORCINE) 1000 UNIT/ML IJ SOLN: 1000 UNIT/ML | INTRAMUSCULAR | Qty: 5

## 2015-07-19 MED FILL — SODIUM CHLORIDE 0.9 % IV SOLN: 0.9 % | INTRAVENOUS | Qty: 1000

## 2015-07-19 MED FILL — VASOSTRICT 20 UNIT/ML IV SOLN: 20 UNIT/ML | INTRAVENOUS | Qty: 1

## 2015-07-19 MED FILL — SODIUM CHLORIDE 0.9 % IV SOLN: 0.9 % | INTRAVENOUS | Qty: 4000

## 2015-07-19 MED FILL — FRESENIUS PROPOVEN 1000 MG/100ML IV EMUL: 1000 MG/100ML | INTRAVENOUS | Qty: 100

## 2015-07-19 MED FILL — HYDROMORPHONE HCL 1 MG/ML IJ SOLN: 1 MG/ML | INTRAMUSCULAR | Qty: 1

## 2015-07-19 MED FILL — FENTANYL CITRATE (PF) 100 MCG/2ML IJ SOLN: 100 MCG/2ML | INTRAMUSCULAR | Qty: 2

## 2015-07-19 MED FILL — VANCOMYCIN HCL 1000 MG IV SOLR: 1000 MG | INTRAVENOUS | Qty: 1250

## 2015-07-19 MED FILL — PHENYLEPHRINE HCL 10 MG/ML IJ SOLN: 10 MG/ML | INTRAMUSCULAR | Qty: 1

## 2015-07-19 MED FILL — HEPARIN LOCK FLUSH 100 UNIT/ML IV SOLN: 100 UNIT/ML | INTRAVENOUS | Qty: 5

## 2015-07-19 MED FILL — SODIUM CHLORIDE 0.9 % IV SOLN: 0.9 % | INTRAVENOUS | Qty: 3000

## 2015-07-19 NOTE — Progress Notes (Signed)
Decreased fio2 from 80% to 50% after ABG results.    Results for MERCER, PEIFER (MRN [id number]) as of 07/19/2015 07:00   Ref. Range 07/19/2015 06:33   pH, Arterial Latest Ref Range: 7.350 - 7.450  7.320 (L)   pCO2, Arterial Latest Ref Range: 35.0 - 45.0 mmHg 40.7   pO2, Arterial Latest Ref Range: 75.0 - 108.0 mmHg 388.0 (H)   HCO3, Arterial Latest Ref Range: 21.0 - 29.0 mmol/L 20.3 (L)   Base Excess, Arterial Latest Ref Range: -3.0 - 3.0 mmol/L -5.1 (L)   O2 Sat, Arterial Latest Ref Range: >92 % 99.4   O2 Content, Arterial Latest Ref Range: Not Established mL/dL 25   Methemoglobin, Arterial Latest Ref Range: <1.5 % 0.7   Carboxyhgb, Arterial Latest Ref Range: 0.0 - 1.5 % 0.1   TCO2 (calc), Art Latest Ref Range: Not Established mmol/L 21.6

## 2015-07-19 NOTE — Progress Notes (Addendum)
Nephrology Progress Note  [id number]  [phone]   [email or web address]    Patient:  Roy Weaver   DOB: 1986-05-02    CC:  Found down    Subjective:  -pt seen and examined  -PMSHx and meds reviewed  -No family at bedside    Started on CRRT this am for hyperkalemia post op and -taken to OR for fasciotomy right forearm 07/19/2015  CK trending down  Poor catheter flow-changed over wire-no improvement  Solute clearance suboptimal  BP stable  extubated    ROS:   Pos pain/neg chest pain/SOB        Meds:  Current Facility-Administered Medications   Medication Dose Route Frequency Provider Last Rate Last Dose   ??? 0.45 % sodium chloride infusion   Intravenous Continuous Sameh Revonda Humphrey, MD       ??? sodium chloride flush 0.9 % injection 10 mL  10 mL Intravenous 2 times per day Filbert Berthold, MD   10 mL at 07/19/15 2956   ??? sodium chloride flush 0.9 % injection 10 mL  10 mL Intravenous PRN Filbert Berthold, MD       ??? acetaminophen (TYLENOL) tablet 650 mg  650 mg Oral Q4H PRN Filbert Berthold, MD       ??? oxyCODONE-acetaminophen (PERCOCET) 5-325 MG per tablet 1 tablet  1 tablet Oral Q4H PRN Filbert Berthold, MD        Or   ??? oxyCODONE-acetaminophen (PERCOCET) 5-325 MG per tablet 2 tablet  2 tablet Oral Q4H PRN Filbert Berthold, MD       ??? docusate sodium (COLACE) capsule 100 mg  100 mg Oral BID Filbert Berthold, MD   100 mg at 07/19/15 0908   ??? ondansetron (ZOFRAN) injection 4 mg  4 mg Intravenous Q6H PRN Filbert Berthold, MD       ??? 0.9 % sodium chloride bolus  1,000 mL Intravenous Once PRN Corky Downs, MD       ??? prismaSATE BGK 4/2.5 dialysis solution   Dialysis Continuous Corky Downs, MD 2,000 mL/hr at 07/19/15 1118 2,000 mL/hr at 07/19/15 1118   ??? prismaSATE BGK 4/2.5 dialysis solution   Dialysis Continuous Corky Downs, MD 2,000 mL/hr at 07/19/15 1118 2,000 mL/hr at 07/19/15 1118   ??? prismaSATE BGK 4/2.5 dialysis solution   Dialysis Continuous Corky Downs, MD 1,000 mL/hr at 07/19/15 1118 1,000 mL/hr at 07/19/15 1118   ??? potassium  chloride 20 mEq/50 mL IVPB (Central Line)  20 mEq Intravenous PRN Corky Downs, MD       ??? magnesium sulfate 1 g in dextrose 5% 100 mL IVPB premix  1 g Intravenous PRN Corky Downs, MD       ??? calcium gluconate 1 g in dextrose 5 % 100 mL IVPB  1 g Intravenous PRN Corky Downs, MD   Stopped at 07/19/15 1230    Or   ??? calcium gluconate 2 g in dextrose 5 % 100 mL IVPB  2 g Intravenous PRN Corky Downs, MD        Or   ??? calcium gluconate 3 g in dextrose 5 % 100 mL IVPB  3 g Intravenous PRN Corky Downs, MD        Or   ??? calcium gluconate 4 g in dextrose 5 % 100 mL IVPB  4 g Intravenous PRN Corky Downs, MD       ??? sodium phosphate 6 mmol  in dextrose 5 % 250 mL IVPB  6 mmol Intravenous PRN Corky Downs, MD        Or   ??? sodium phosphate 12 mmol in dextrose 5 % 250 mL IVPB  12 mmol Intravenous PRN Corky Downs, MD        Or   ??? sodium phosphate 18 mmol in dextrose 5 % 500 mL IVPB  18 mmol Intravenous PRN Corky Downs, MD        Or   ??? sodium phosphate 24 mmol in dextrose 5 % 500 mL IVPB  24 mmol Intravenous PRN Corky Downs, MD       ??? heparin (porcine) injection 1,000 Units  1,000 Units Intercatheter Once PRN Corky Downs, MD       ??? alteplase (CATHFLO) injection 1 mg  1 mg Intracatheter Once PRN Corky Downs, MD       ??? heparin (porcine) 5,000 Units in sodium chloride 0.9 % 20 mL infusion   Intravenous Continuous Kaiden Dardis Patel-Chamberlin, MD       ??? heparin (porcine) 5,000 Units in sodium chloride 0.9 % 1,000 mL infusion  25 mL/hr Dialysis Continuous Loriene Taunton Patel-Chamberlin, MD       ??? levETIRAcetam (KEPPRA) tablet 1,000 mg  1,000 mg Oral BID Bernette Mayers, MD   1,000 mg at 07/19/15 0830   ??? heparin (porcine) injection 5,000 Units  5,000 Units Subcutaneous 3 times per day Bernette Mayers, MD   5,000 Units at 07/19/15 0636   ??? ampicillin-sulbactam (UNASYN) 1.5 g IVPB minibag  1.5 g Intravenous Q6H Bernette Mayers, MD   Stopped at 07/19/15 [phone]   ??? vancomycin (VANCOCIN) intermittent dosing  (placeholder)   Other RX Placeholder Bernette Mayers, MD       ??? sodium bicarbonate 75 mEq in sodium chloride 0.45 % 1,000 mL infusion   Intravenous Continuous Corky Downs, MD 100 mL/hr at 07/19/15 0230     ??? propofol 1000 MG/100ML injection  10 mcg/kg/min Intravenous Titrated Franciso Bend, DO   Stopped at 07/19/15 9604   ??? fentaNYL (SUBLIMAZE) injection 50 mcg  50 mcg Intravenous Q1H PRN Franciso Bend, DO   50 mcg at 07/19/15 0300       Vitals:  Visit Vitals   ??? BP (!) 141/98   ??? Pulse 104   ??? Temp 98.5 ??F (36.9 ??C) (Oral)   ??? Resp 18   ??? Ht  (1.93 m)   ??? Wt 201 lb 15.1 oz (91.6 kg)   ??? SpO2 100%   ??? BMI 24.58 kg/m2       Physical Exam:  Gen: Resting in bed, NAD.    HEENT: MMM, OP clear. anciteric  CV: RRR no m/r/g.  No S3.  Lungs: CTA-B, no resp distress  Abd: S/NT +BS  Ext: No edema, no cyanosis-dressing to right arm intact, RLE dressing C/D/I  Skin: Warm.  No rashes appreciated.    Labs:  BMP:    Lab Results   Component Value Date    NA 132 07/19/2015    K 5.0 07/19/2015    CL 97 07/19/2015    CO2 21 07/19/2015    BUN 27 07/19/2015    LABALBU 2.4 07/19/2015    CREATININE 3.9 07/19/2015    CREATININE 1.0 08/17/2011    CALCIUM 6.6 07/19/2015    GFRAA 22 07/19/2015    GFRAA 157 08/19/2011    LABGLOM 18 07/19/2015    GLUCOSE 129 07/19/2015  Assessment/Plan:  AKI: due to rhabdomyolysis-initiated on CRRT ON due to hyperkalemia-cath dysfunctional -changed over guidewire  -will add heparin  -hemodynamically stable  -keep even  -continue sodium bicarbonate for now  -will transition to iHD soon-no need to resume if catheter remains dysfunctional  -keep even    FEN: hyperkalemia: 2/2 rhabdo and AKI-started on CRRT-improved    Rhabdomyolysis: CK is improving    Transamnitis: ischemic injury, monitor trend    Leukcytosis: stable    compartment syndrome right: s/p fasciotomy  -per orthopedic surgery    Polysubstance abuse: per primary-SW referral when stable    Critical care time  : (14:00-14:30)  D/w nurse    Cathlean Sauer, MD  07/19/2015

## 2015-07-19 NOTE — Progress Notes (Signed)
Vascular    S/P Lower extremity fasciotomy.  Dressings with serosang drainage.  CK decreased to 190K from 300K  Palp DP pulse  No motor or sensory function foot/ankle.    Nursing reports poor flow and clotting of circuit despite excellent position of Catheter on CXR.  Under sterile technique 20cm Catheter exchanged to a 16cm Catheter.  CXR show tip at junction SVC and RA.    Dressing changes BID wet to dry  Too early to tell what function he will have in RLE.

## 2015-07-19 NOTE — Consults (Addendum)
Clinical Pharmacy Note  Vancomycin Consult    Roy Weaver is a 30 y.o. male ordered Vancomycin for pneumonia; consult received from Dr. Dr. Norval Gable to manage therapy. Also receiving Ampicillin-Sulbactam.    Patient Active Problem List   Diagnosis   ??? Seizures (HCC)   ??? Migraine   ??? Recurrent seizures (HCC)   ??? History of nonadherence to medical treatment   ??? Delirium   ??? Drug use   ??? Partial epilepsy with impairment of consciousness (HCC)   ??? Seizures (HCC)   ??? Partial symptomatic epilepsy with complex partial seizures, intractable, without status epilepticus (HCC)   ??? Noncompliance with medication regimen   ??? Aspiration pneumonia of right lung (HCC)   ??? 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 Seizure (HCC)   ??? Visit for suture removal   ??? Post-ictal state (HCC)   ??? Breakthrough seizure (HCC)   ??? Traumatic rhabdomyolysis (HCC)   ??? AKI (acute kidney injury) (HCC)   ??? 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 Non-traumatic compartment syndrome of right upper extremity   ??? Acute respiratory failure with hypoxia (HCC)       Allergies:  Review of patient's allergies indicates no known allergies.     Temp max:  Temp (24hrs), Avg:98 ??F (36.7 ??C), Min:97 ??F (36.1 ??C), Max:99.4 ??F (37.4 ??C)      Recent Labs      07/18/15   1215  07/19/15   0455   WBC  17.6*  17.4*       Recent Labs      07/18/15   2250  07/19/15   0455  07/19/15   0755   BUN  31*  29*   28*  27*   CREATININE  3.9*  4.0*   4.0*  3.9*         Intake/Output Summary (Last 24 hours) at 07/19/15 0943  Last data filed at 07/19/15 1610   Gross per 24 hour   Intake           4889.6 ml   Output              575 ml   Net           4314.6 ml       Culture Results:  pending    Ht Readings from Last 1 Encounters:   07/18/15  (1.93 m)      Wt Readings from Last 1 Encounters:   07/19/15 201 lb 15.1 oz (91.6 kg)         Estimated Creatinine Clearance: 35 mL/min (based on Cr of  3.9).    Assessment/Plan:  Patient received Vancomycin 1,500 mg IVPB x 1 dose on 07/18/15 at 1732.  Random Vancomycin level today = 13.5 ug/mL.  Target Vancomycin trough = 15-20 ug/mL.  Patient remains on CRRT.  Will re-dose Vancomycin 1,250 mg IVPB x 1 dose now.  Will obtain a Random Vancomycin level with AM labs on 07/20/15.     Thank you for the consult.  Will continue to follow.    Consuello Closs, PharmD, BCPS  07/19/2015 9:45 AM

## 2015-07-19 NOTE — Progress Notes (Signed)
POD # 1, s/p decompression fasciotomy right forearm.    Pt comfortable, no c/o. Extubated, in ICU  Drsg right forearm D/C/I,  No significant pain with right wrist ROM, improved ulnar nerve function, good radial pulse right wrist.    CBC:   Lab Results   Component Value Date    WBC 17.4 07/19/2015    RBC 5.86 07/19/2015    HGB 17.4 07/19/2015    HCT 52.6 07/19/2015    MCV 89.7 07/19/2015    MCH 29.8 07/19/2015    MCHC 33.2 07/19/2015    RDW 13.2 07/19/2015    PLT 205 07/19/2015    MPV 8.2 07/19/2015     PT/INR:    Lab Results   Component Value Date    PROTIME 11.7 03/29/2014    INR 1.03 03/29/2014     PTT:    Lab Results   Component Value Date    APTT 31.1 09/13/2013   [APTT    A/P: s/p decompression fasciotomy right forearm.  - Critical, but stable  - Elevation right wrist  - Change Drsg tomorrow, right forearm.      Filbert Berthold , MD 07/19/2015 9:50 am

## 2015-07-19 NOTE — Progress Notes (Signed)
Nephrology Progress [phone]  [phone]   [email or web address]    Patient:  Roy Weaver   DOB: May 26, 1985    Pt underwent decompressive fasciotomy earlier this evening.  A hemodialysis catheter was placed.  Given hyperkalemia, AKI, and oliguria, I will start CRRT.  Orders placed tonight, 4K/2.5Ca at 2L/hr dialysate, and 1L/hr pre and post filter.  Net effluent should be over 4ml/kg/hr.     Corky Downs, MD, FASN, FACP, RPVI  07/19/2015

## 2015-07-19 NOTE — Other (Signed)
PT eval attempt. Pt just extubated. Nursing requests to hold PT until Monday. Will possibly check back tomorrow to see if we should continue to hold PT until 07/21/15.     Electronically signed by Stanford Scotland, PT on 07/19/2015 at 10:42 AM

## 2015-07-19 NOTE — Progress Notes (Signed)
Occupational Therapy   Occupational Therapy order noted, but patient was just extubated and put on hold by nursing for today.  Will try O.T. Evaluation tomorrow or Monday, as appropriate.        Carrington Clamp, OTL

## 2015-07-19 NOTE — Progress Notes (Signed)
The patient's spontaneous breathing trial results were reviewed with  Dr. Charm Barges.  The order was received to extubate the patient.  The patient's sedation was turned off by the bedside nurse.  The patient was extubated at 09:36 and placed on 2 liters/min via nasal cannula.  The patient was stable at the time of extubation.     Kathrin Ruddy Jemeka Wagler, RCP

## 2015-07-19 NOTE — Plan of Care (Signed)
Problem: Falls - Risk of  Goal: Absence of falls  Outcome: Met This Shift  Pt. Calm and cooperative. Reinforce teaching about safety. Pt. Verbalized understanding. Bed alarm on. Fall light on. Kept comfortable. Plan of care discussed with pt. Call light within reach.         Problem: Risk for Impaired Skin Integrity  Goal: Tissue integrity - skin and mucous membranes  Structural intactness and normal physiological function of skin and  mucous membranes.   Outcome: Ongoing    Problem: Infection - Central Venous Catheter-Associated Bloodstream Infection:  Goal: Will show no infection signs and symptoms  Will show no infection signs and symptoms   Outcome: Ongoing    Problem: Urinary Elimination:  Goal: Signs and symptoms of infection will decrease  Signs and symptoms of infection will decrease   Outcome: Ongoing    Problem: Pain:  Goal: Control of acute pain  Control of acute pain   Outcome: Ongoing  Pt. C/o right shoulder, arm and leg pain. Given percocet and dilaudid PO as ordered. Repositioned and turned for comfort. Will monitor.

## 2015-07-19 NOTE — Progress Notes (Signed)
Hospitalist Progress Note  07/19/2015 9:46 AM    PCP: Baldwin Jamaica, MD    [id number]     Date of Admission: 07/18/2015                                                                                                                     HOSPITAL COURSE    Patient demographics:  The patient  Roy Weaver is a 30 y.o. male     Significant past medical history:   Patient Active Problem List   Diagnosis   ??? Seizures (HCC)   ??? Migraine   ??? Recurrent seizures (HCC)   ??? History of nonadherence to medical treatment   ??? Delirium   ??? Drug use   ??? Partial epilepsy with impairment of consciousness (HCC)   ??? Seizures (HCC)   ??? Partial symptomatic epilepsy with complex partial seizures, intractable, without status epilepticus (HCC)   ??? Noncompliance with medication regimen   ??? Aspiration pneumonia of right lung (HCC)   ??? 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 Seizure (HCC)   ??? Visit for suture removal   ??? Post-ictal state (HCC)   ??? Breakthrough seizure (HCC)   ??? Traumatic rhabdomyolysis (HCC)   ??? AKI (acute kidney injury) (HCC)   ??? 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 Non-traumatic compartment syndrome of right upper extremity   ??? Acute respiratory failure with hypoxia (HCC)         Presenting symptoms:  Found down at home.    Diagnostic workup:  XR Humerus Right Standard   XR Chest Portable   CT Head WO Contrast       CONSULTS DURING ADMISSION :   IP CONSULT TO HOSPITALIST  IP CONSULT TO NEPHROLOGY  IP CONSULT TO ORTHOPEDIC SURGERY  IP CONSULT TO GENERAL SURGERY  PHARMACY TO DOSE VANCOMYCIN  IP CONSULT TO VASCULAR SURGERY      Patient was diagnosed with:  Drug Overdose      Treatment while inpatient:                                Procedures:                                                       ---------------------------------------------------------------------------      SUBJECTIVE COMPLAINTS- c/o pain in R arm and R leg.    Diet: DIET  GENERAL;  Dietary Nutrition Supplements: Standard High Calorie Oral Supplement      OBJECTIVE:   Patient Active Problem List   Diagnosis   ??? Seizures (HCC)   ??? Migraine   ??? Recurrent seizures (HCC)   ??? History of nonadherence to medical treatment   ??? Delirium   ???  Drug use   ??? Partial epilepsy with impairment of consciousness (HCC)   ??? Seizures (HCC)   ??? Partial symptomatic epilepsy with complex partial seizures, intractable, without status epilepticus (HCC)   ??? Noncompliance with medication regimen   ??? Aspiration pneumonia of right lung (HCC)   ??? 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 Seizure (HCC)   ??? Visit for suture removal   ??? Post-ictal state (HCC)   ??? Breakthrough seizure (HCC)   ??? Traumatic rhabdomyolysis (HCC)   ??? AKI (acute kidney injury) (HCC)   ??? 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 Non-traumatic compartment syndrome of right upper extremity   ??? Acute respiratory failure with hypoxia (HCC)       Allergies  Review of patient's allergies indicates no known allergies.    Medications    Scheduled Meds:  ??? sodium chloride flush  10 mL Intravenous 2 times per day   ??? docusate sodium  100 mg Oral BID   ??? vancomycin  1,000 mg Intravenous Once   ??? levETIRAcetam  1,000 mg Oral BID   ??? heparin (porcine)  5,000 Units Subcutaneous 3 times per day   ??? ampicillin-sulbactam  1.5 g Intravenous Q6H   ??? vancomycin (VANCOCIN) intermittent dosing (placeholder)   Other RX Placeholder     Continuous Infusions:  ??? sodium chloride     ??? prismaSATE BGK 4/2.5 2,000 mL/hr (07/19/15 7253)   ??? prismaSATE BGK 4/2.5 2,000 mL/hr (07/19/15 0650)   ??? prismaSATE BGK 4/2.5 1,000 mL/hr (07/19/15 0238)   ??? sodium bicarbonate infusion 100 mL/hr at 07/19/15 0230   ??? propofol Stopped (07/19/15 0824)     PRN Meds:  sodium chloride flush, acetaminophen, oxyCODONE-acetaminophen **OR** oxyCODONE-acetaminophen, magnesium hydroxide, ondansetron, sodium chloride, potassium chloride,  magnesium sulfate, calcium gluconate IVPB **OR** calcium gluconate IVPB **OR** calcium gluconate IVPB **OR** calcium gluconate IVPB, sodium phosphate IVPB **OR** sodium phosphate IVPB **OR** sodium phosphate IVPB **OR** sodium phosphate IVPB, heparin (porcine), alteplase, fentanNYL    Vitals   Vitals /wt Patient Vitals for the past 8 hrs:   BP Temp Temp src Pulse Resp SpO2   07/19/15 0909 119/61 - - 127 14 100 %   07/19/15 0857 - - - - 14 100 %   07/19/15 0824 - - - 108 16 99 %   07/19/15 0801 124/89 97.7 ??F (36.5 ??C) Oral 99 17 100 %   07/19/15 0702 (!) 142/116 - - 98 15 100 %   07/19/15 0634 - - - - 14 98 %   07/19/15 0602 (!) 129/91 - - 97 15 100 %   07/19/15 0500 (!) 135/93 98.2 ??F (36.8 ??C) Oral 95 18 100 %   07/19/15 0300 (!) 145/86 - - - - -   07/19/15 0200 133/85 - - 99 17 100 %        72HR INTAKE/OUTPUT:    Intake/Output Summary (Last 24 hours) at 07/19/15 0946  Last data filed at 07/19/15 0925   Gross per 24 hour   Intake           4889.6 ml   Output              575 ml   Net           4314.6 ml       Exam:    Gen:   Alert and oriented ??3   Eyes: PERRL. No sclera icterus. No conjunctival injection.   ENT:  No discharge. Pharynx clear. External appearance of ears and nose normal.  Neck: Trachea midline. No obvious mass.    Resp: decreased breath sounds.  CV: Regular rate. Regular rhythm. No murmur or rub. No edema.   GI: Non-tender. Non-distended. No hernia.   Skin: Warm, dry, normal texture and turgor.   Lymph: No cervical LAD. No supraclavicular LAD.   M/S: / Ext. No cyanosis. No clubbing. No joint deformity.    Neuro: CN 2-12 are intact,  no neurologic deficits noted.    PT/INR: No results for input(s): PROTIME, INR in the last 72 hours.  APTT: No results for input(s): APTT in the last 72 hours.    CBC: Recent Labs      07/18/15   1215  07/19/15   0455   WBC  17.6*  17.4*   HGB  17.0  17.4   HCT  52.0  52.6*   MCV  91.1  89.7   PLT  260  205       BMP: Recent Labs      07/18/15   1550  07/18/15   2250   07/19/15   0143  07/19/15   0455  07/19/15   0755   NA  139  137   --   134*   132*  132*   K  4.7  6.5*   --   5.0   5.0  5.0   CL  103  102   --   99   98*  97*   CO2  21  22   --   21   21  21    PHOS  4.2  3.7  4.2  3.6  4.1   BUN  26*  31*   --   29*   28*  27*   CREATININE  3.1*  3.9*   --   4.0*   4.0*  3.9*       LIVER PROFILE: Recent Labs      07/18/15   1215  07/19/15   0455   ALKPHOS  100  89   AST  2101*  1525*   ALT  362*  327*   BILIDIR   --   <0.2   BILITOT  0.3  <0.2     No results for input(s): AMYLASE in the last 72 hours.  No results for input(s): LIPASE in the last 72 hours.    UA:  Recent Labs      07/18/15   1430   WBCUA  0   RBCUA  2       TROPONIN: Recent Labs      07/18/15   1550  07/18/15   2250  07/19/15   0455   CKTOTAL  300560*  262400*  161096*       Lab Results   Component Value Date/Time    URRFLXCULT Not Indicated 07/18/2015 02:30 PM       No results for input(s): TSHREFLEX in the last 72 hours.    No components found for: LAB5370  POC GLUCOSE:  No results for input(s): POCGLU in the last 72 hours.  No results for input(s): LABA1C in the last 72 hours. No results found for: LABA1C      ASSESSMENT AND PLAN  Drug Overdose  Prior + for cocaine, opiates and cannabis.     ??  ??  Severe Rhabdomyolysis - CK 200K+ on admit.   UA blood(+) and RBC (-) suggestive of myoglobinuria.  IVF with Bicarb 125cc/hr + NS at 75cc/hr - to run at 200cc total rate.   Trend CK.   Nephrology involved.   ??  ??  AKI due to above.   IVF  Trend renal q4.     ??  Hyperkalemia - K 7 - no EKG changes.   Treated urgently with bicarb, insulin and D50, IVF.   CRRT  ??  ??  AGMA - LA, elevated BUN.   IVf hydration.   ??  ??  Abnormal LFT - transaminitis with normal bilirubin.   Likely drug toxic effect and rhabdo.   Has Hx of HepC - untreated.   Trend LFT.   GI consult if no improvement.  ??  ??  compartment syndrome - RUE and R thigh.   S/p fasciotomy by ortho   ??  ??  Seizure d/o - Keppra level actually normal this admit.   Cont  keppra.   ??  ??  R Elbow Erythema -   suspect due to pressure of prolonged immobility, but pt IV heroin user.   MRSA negative-DC vanc  ??  ??  Acute toxic encephalopathy -   due to drug overdose.   CT head negative  ??  ??  Maxillary Sinusitis R -   cover with unasyn.   ??      Code Status: Full Code        Dispo - cc        The patient and / or the family were informed of the results of any tests, a time was given to answer questions, a plan was proposed and they agreed with plan.    Tylena Prisk Modena Morrow, MD

## 2015-07-19 NOTE — Progress Notes (Signed)
2245: Patient returned from surgery. Dr. Beverly Gust ordered propofol and was verified by critical care, Dr. Charm Barges called informed of patient's current status and sedatoin see new orders.     0045: Nephrology, Dr. Rondel Baton, called and informed of critical lab results and all pertinent labs including, CK, potassium, and complete BMP panel. Ordered CRRT see new orders.    0100: Patient's mother, Wynona Canes called and consent was received for CRRT initiation, see hard chart double verified with other nurse     [phone]: CRRT started and running Fiter pressure in the 200's    0320: Filter pressure reached 390's filter changed    0400: New filter in place and running, Dr. Rondel Baton called informed of high filter pressures frequent filter changes, increased pre blood pump to 2000 ml/hr.    1914: Filter clotted could not return blood filter changed    Do not release code: [id number] provided to mother    Electronically signed by Lynn Ito, RN on 07/19/2015 at 7:36 AM

## 2015-07-19 NOTE — Consults (Signed)
PATIENT IS SEEN AT THE REQUEST OF DR.  Bacanurschi for respiratory failure, compartment syndrome    CONSULTING PHYSICIAN: Bacanurschi    HISTORY OF PRESENT ILLNESS:  This is a 30 y.o. male who presented to the ED on 2/24 with a CC of seizures.  Patient was found on his bathroom floor by his mother.  He had used heroin the night before and passed out.  He apparently was on his right side for a period of time and developed compartment syndrome.  This led to renal failure.  He went to OR for fasciotomy and then was started on CRRT.  He returned to the ICU on ventilator.      Established Pulmonologist:  None    PAST MEDICAL HISTORY:  Past Medical History   Diagnosis Date   ??? AKI (acute kidney injury) (HCC)    ??? Migraine    ??? Psychiatric problem    ??? Seizures (HCC)    ??? Traumatic rhabdomyolysis (HCC)        PAST SURGICAL HISTORY:  History reviewed. No pertinent past surgical history.    FAMILY HISTORY:  family history includes Diabetes in his father.    SOCIAL HISTORY:   reports that he has been smoking Cigarettes.  He has a 2.00 pack-year smoking history. He quit smokeless tobacco use about 21 months ago.    Scheduled Meds:  ??? levETIRAcetam  1,000 mg Oral BID   ??? sodium chloride flush  10 mL Intravenous 2 times per day   ??? heparin (porcine)  5,000 Units Subcutaneous 3 times per day   ??? ampicillin-sulbactam  1.5 g Intravenous Q6H   ??? vancomycin (VANCOCIN) intermittent dosing (placeholder)   Other RX Placeholder       Continuous Infusions:  ??? prismaSATE BGK 4/2.5 2,000 mL/hr (07/19/15 9147)   ??? prismaSATE BGK 4/2.5 2,000 mL/hr (07/19/15 0650)   ??? prismaSATE BGK 4/2.5 1,000 mL/hr (07/19/15 0238)   ??? sodium bicarbonate infusion 100 mL/hr at 07/19/15 0230   ??? propofol 15 mcg/kg/min (07/19/15 0425)       PRN Meds:  sodium chloride, potassium chloride, magnesium sulfate, calcium gluconate IVPB **OR** calcium gluconate IVPB **OR** calcium gluconate IVPB **OR** calcium gluconate IVPB, sodium phosphate IVPB **OR** sodium  phosphate IVPB **OR** sodium phosphate IVPB **OR** sodium phosphate IVPB, heparin (porcine), alteplase, sodium chloride flush, acetaminophen, magnesium hydroxide, ondansetron, fentanNYL, HYDROmorphone, labetalol, fentanNYL    ALLERGIES:  Patient has No Known Allergies.    REVIEW OF SYSTEMS:  Unable    PHYSICAL EXAM:  Blood pressure (!) 129/91, pulse 97, temperature 98.2 ??F (36.8 ??C), temperature source Oral, resp. rate 14, height  (1.93 m), weight 201 lb 15.1 oz (91.6 kg), SpO2 98 %.'  Gen: No distress. Awake on vent  Eyes: PERRL. No sclera icterus. No conjunctival injection.   ENT: No discharge. Pharynx with ETT  Neck: Trachea midline. No obvious mass.    Resp: No accessory muscle use. No crackles. No wheezes. No rhonchi.   CV: Regular rate. Regular rhythm. No murmur or rub.  GI: Non-tender. Non-distended. No hernia. BS present.  Skin: Warm and dry. No nodule on exposed extremities.   Lymph: No cervical LAD. No supraclavicular LAD.   M/S: RUE swelling, RLE swelling.  Compression wraps  Neuro: Awake. Alert. Moves all four extremities.   EXT:   Edema along the right side, no clubbing    LABS:  CBC:   Recent Labs      07/18/15   1215  07/19/15  0455   WBC  17.6*  17.4*   HGB  17.0  17.4   HCT  52.0  52.6*   MCV  91.1  89.7   PLT  260  205     BMP:   Recent Labs      07/18/15   1550  07/18/15   2250  07/19/15   0143  07/19/15   0455   NA  139  137   --   134*   132*   K  4.7  6.5*   --   5.0   5.0   CL  103  102   --   99   98*   CO2  21  22   --   21   21   PHOS  4.2  3.7  4.2  3.6   BUN  26*  31*   --   29*   28*   CREATININE  3.1*  3.9*   --   4.0*   4.0*     LIVER PROFILE:   Recent Labs      07/18/15   1215  07/19/15   0455   AST  2101*  1525*   ALT  362*  327*   BILIDIR   --   <0.2   BILITOT  0.3  <0.2   ALKPHOS  100  89     PT/INR: No results for input(s): PROTIME, INR in the last 72 hours.  APTT: No results for input(s): APTT in the last 72 hours.  UA:  Recent Labs      07/18/15   1430   COLORU  DK YELLOW    PHUR  6.0   6.0   WBCUA  0   RBCUA  2   CLARITYU  Clear   SPECGRAV  1.020   LEUKOCYTESUR  Negative   UROBILINOGEN  0.2   BILIRUBINUR  Negative   BLOODU  LARGE*   GLUCOSEU  Negative     Recent Labs      07/19/15   0633   PHART  7.320*   PCO2ART  40.7   PO2ART  388.0*       PFTs:  None      ECHO:  None     ABG:  7.32/41/388  7.34/38/238     Chest X-ray:  Chest imaging was reviewed by me and showed ETT, and CVC in place.  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  Clear lung fields    ASSESSMENT:  ?? Acute Hypoxic Respiratory Failure  ?? Acute Renal Failure  ?? Compartment Syndrome  ?? Rhabdomyolysis  ?? SIRS  ?? Drug Overdose    PLAN:  ?? On PS trial.  ABG reviewed.  Will extubate  ?? Titrate oxygen for saturations greater than or equal to 92%  ?? CRRT per Nephro  ?? HCO3 gtt  ?? Surgery and Ortho following  ?? DVT prophylaxis  ?? Renal diet when awake  ?? Cultures  ?? May be able to DC Vanco due to negative MRSA probe    Critical care time of 31 minutes.  This does not include procedural time.  Please see procedural notes for details.    Berniece Andreas, DO  [facility] Pulmonary

## 2015-07-19 NOTE — Plan of Care (Signed)
0801 pt, easily arousable, sedated on the ventilator. Follows command, unable to move right leg. VSS. SR on the monitor. Full assessment done. Right arm and right leg dressing in place, saturating with serousanguinous drainage. Bed alarm on. Sedation turned off for SBT. Will monitor.   8119 dr. Charm Barges rounding, updated. SBT ongoing, possible extubation. ABG in 10 mins. Plan of care discussed.   1478 pt. Extubated, placed on 2 lpm NC. 100% SPO2. Enc. Deep breathing and coughing.   2956 dr. Su Hoff here, updated. Examined pt. Will plan on changing dressing tomorrow. No new orders.   39 dr bayer here, updated. Made aware of vascath alarming return pressures high and access negative. Catheter  changed over guide wire. Shorter catheter size 16cm in place. Chest xray verified by dr. Ramond Dial. Will try to restart CRRT again. If line problem cont. to exist, may have to consider heparin with CRRT. MD ok with suggestion. Dressing changes ordered.   1227 reassessment done. Pt. Awake and oriented x4. VSS. C/o right shoulder pain from laying in bed. Pt. Repositioned. Mother at bedside. Updated with plan of care. CRRT filter clotted already. Stopped, unable to give back blood. Will call renal MD for heparin order.   1321 dr. Montine Circle rounding, updated with CRRT status and issues. Heparin orders received.   1405 Pt. Given percocet po with sips of apple juice. dressing changes done. Wound beds look clean, pink and red with moderate bleeding noted. Areas packed with moist kurlex then covered with abd pads and wrapped with kurlex. Bed pads changed. Turned and repositioned. Right arm and leg elevated with pillows.   1550 dr. Ninfa Linden here, updated. Pain meds for dressing changes ordered. No new management.   1615 reassessment done. Pt. On and off sleeping. CRRT ongoing. Will keep monitoring.   1729 pt. C/o pain on his right side, given dilaudid PO as ordered. Kept comfortable.   1845 pt. Awake repositioned and turned. HOB >30  degrees, fed dinner.

## 2015-07-19 NOTE — Progress Notes (Shared)
The patient was assessed for readiness to wean at 5 am.  The patient met 12 of the criteria for a spontaneous breathing trial.  Sedation assessment was performed by the RN and the patient's RASS score was -1.  The SBT was started at ***.  After 30 minutes, the RSBI was *** and an ABG was sent.  The ABG result was   Recent Labs      07/19/15   0633   PHART  7.320*   PCO2ART  40.7   PO2ART  388.0*    and the patient was {insert vent change} at ***.     Gracelyn Nurse, RCP

## 2015-07-20 LAB — COMPREHENSIVE METABOLIC PANEL
ALT: 198 U/L — ABNORMAL HIGH (ref 10–40)
ALT: 270 U/L — ABNORMAL HIGH (ref 10–40)
ALT: 296 U/L — ABNORMAL HIGH (ref 10–40)
AST: 958 U/L — ABNORMAL HIGH (ref 15–37)
AST: 1227 U/L — ABNORMAL HIGH (ref 15–37)
AST: 1188 U/L — ABNORMAL HIGH (ref 15–37)
Albumin/Globulin Ratio: 0.8 — ABNORMAL LOW (ref 1.1–2.2)
Albumin/Globulin Ratio: 0.8 — ABNORMAL LOW (ref 1.1–2.2)
Albumin/Globulin Ratio: 1 — ABNORMAL LOW (ref 1.1–2.2)
Albumin/Globulin Ratio: 1.1 (ref 1.1–2.2)
Albumin: 2.2 g/dL — ABNORMAL LOW (ref 3.4–5.0)
Albumin: 3.1 g/dL — ABNORMAL LOW (ref 3.4–5.0)
Albumin: 3 g/dL — ABNORMAL LOW (ref 3.4–5.0)
Alkaline Phosphatase: 67 U/L (ref 40–129)
Alkaline Phosphatase: 75 U/L (ref 40–129)
Alkaline Phosphatase: 66 U/L (ref 40–129)
Anion Gap: 15 (ref 3–16)
Anion Gap: 14 (ref 3–16)
Anion Gap: 16 (ref 3–16)
Anion Gap: 17 — ABNORMAL HIGH (ref 3–16)
BUN: 23 mg/dL — ABNORMAL HIGH (ref 7–20)
BUN: 21 mg/dL — ABNORMAL HIGH (ref 7–20)
BUN: 12 mg/dL (ref 7–20)
BUN: 12 mg/dL (ref 7–20)
CO2: 23 mmol/L (ref 21–32)
CO2: 24 mmol/L (ref 21–32)
CO2: 25 mmol/L (ref 21–32)
CO2: 24 mmol/L (ref 21–32)
Calcium: 7.4 mg/dL — ABNORMAL LOW (ref 8.3–10.6)
Calcium: 7.1 mg/dL — ABNORMAL LOW (ref 8.3–10.6)
Calcium: 7.8 mg/dL — ABNORMAL LOW (ref 8.3–10.6)
Calcium: 7.9 mg/dL — ABNORMAL LOW (ref 8.3–10.6)
Chloride: 101 mmol/L (ref 99–110)
Chloride: 101 mmol/L (ref 99–110)
Chloride: 99 mmol/L (ref 99–110)
Chloride: 97 mmol/L — ABNORMAL LOW (ref 99–110)
Creatinine: 3.7 mg/dL — ABNORMAL HIGH (ref 0.9–1.3)
Creatinine: 3.3 mg/dL — ABNORMAL HIGH (ref 0.9–1.3)
Creatinine: 1.8 mg/dL — ABNORMAL HIGH (ref 0.9–1.3)
Creatinine: 1.9 mg/dL — ABNORMAL HIGH (ref 0.9–1.3)
GFR African American: 24 — AB (ref >60)
GFR African American: 27 — AB (ref >60)
GFR African American: 55 — AB (ref >60)
GFR African American: 51 — AB (ref >60)
GFR Non-African American: 20 — AB (ref >60)
GFR Non-African American: 22 — AB (ref >60)
GFR Non-African American: 45 — AB (ref >60)
GFR Non-African American: 42 — AB (ref >60)
Globulin: 2.7 g/dL
Globulin: 2.6 g/dL
Globulin: 3.2 g/dL
Globulin: 2.8 g/dL
Glucose: 154 mg/dL — ABNORMAL HIGH (ref 70–99)
Glucose: 138 mg/dL — ABNORMAL HIGH (ref 70–99)
Glucose: 123 mg/dL — ABNORMAL HIGH (ref 70–99)
Glucose: 125 mg/dL — ABNORMAL HIGH (ref 70–99)
Potassium: 5.1 mmol/L (ref 3.5–5.1)
Potassium: 4.9 mmol/L (ref 3.5–5.1)
Potassium: 4.6 mmol/L (ref 3.5–5.1)
Potassium: 4.5 mmol/L (ref 3.5–5.1)
Sodium: 139 mmol/L (ref 136–145)
Sodium: 139 mmol/L (ref 136–145)
Sodium: 140 mmol/L (ref 136–145)
Sodium: 138 mmol/L (ref 136–145)
Total Bilirubin: 0.3 mg/dL (ref 0.0–1.0)
Total Bilirubin: 0.4 mg/dL (ref 0.0–1.0)
Total Bilirubin: 0.4 mg/dL (ref 0.0–1.0)
Total Protein: 4.9 g/dL — ABNORMAL LOW (ref 6.4–8.2)
Total Protein: 6.3 g/dL — ABNORMAL LOW (ref 6.4–8.2)
Total Protein: 5.8 g/dL — ABNORMAL LOW (ref 6.4–8.2)

## 2015-07-20 LAB — CBC WITH AUTO DIFFERENTIAL
Basophils %: 0.1 %
Basophils Absolute: 0 10*3/uL (ref 0.0–0.2)
Eosinophils %: 0 %
Eosinophils Absolute: 0 10*3/uL (ref 0.0–0.6)
Hematocrit: 37 % — ABNORMAL LOW (ref 40.5–52.5)
Hemoglobin: 12 g/dL — ABNORMAL LOW (ref 13.5–17.5)
Lymphocytes %: 8 %
Lymphocytes Absolute: 1.1 10*3/uL (ref 1.0–5.1)
MCH: 29.2 pg (ref 26.0–34.0)
MCHC: 32.4 g/dL (ref 31.0–36.0)
MCV: 90 fL (ref 80.0–100.0)
MPV: 8.5 fL (ref 5.0–10.5)
Monocytes %: 6.4 %
Monocytes Absolute: 0.9 10*3/uL (ref 0.0–1.3)
Neutrophils %: 85.5 %
Neutrophils Absolute: 12 10*3/uL — ABNORMAL HIGH (ref 1.7–7.7)
Platelets: 125 10*3/uL — ABNORMAL LOW (ref 135–450)
RBC: 4.11 M/uL — ABNORMAL LOW (ref 4.20–5.90)
RDW: 13.3 % (ref 12.4–15.4)
WBC: 14 10*3/uL — ABNORMAL HIGH (ref 4.0–11.0)

## 2015-07-20 LAB — HEPATIC FUNCTION PANEL
ALT: 181 U/L — ABNORMAL HIGH (ref 10–40)
AST: 930 U/L — ABNORMAL HIGH (ref 15–37)
Albumin: 2 g/dL — ABNORMAL LOW (ref 3.4–5.0)
Alkaline Phosphatase: 64 U/L (ref 40–129)
Bilirubin, Direct: <0.2 mg/dL (ref 0.0–0.3)
Total Bilirubin: 0.3 mg/dL (ref 0.0–1.0)
Total Protein: 4.6 g/dL — ABNORMAL LOW (ref 6.4–8.2)

## 2015-07-20 LAB — APTT
aPTT: 93.1 s — ABNORMAL HIGH (ref 21.0–31.8)
aPTT: 57.7 s — ABNORMAL HIGH (ref 21.0–31.8)
aPTT: 72 s — ABNORMAL HIGH (ref 21.0–31.8)
aPTT: 70.4 s — ABNORMAL HIGH (ref 21.0–31.8)
aPTT: 81.9 s — ABNORMAL HIGH (ref 21.0–31.8)

## 2015-07-20 LAB — CALCIUM, IONIZED
Calcium, Ionized: 1.03 mmol/L — ABNORMAL LOW (ref 1.12–1.32)
Calcium, Ionized: 1.05 mmol/L — ABNORMAL LOW (ref 1.12–1.32)
Calcium, Ionized: 1.05 mmol/L — ABNORMAL LOW (ref 1.12–1.32)
Calcium, Ionized: 1.06 mmol/L — ABNORMAL LOW (ref 1.12–1.32)
Calcium, Ionized: 1.1 mmol/L — ABNORMAL LOW (ref 1.12–1.32)
pH, Ven: 7.26 — ABNORMAL LOW (ref 7.35–7.45)
pH, Ven: 7.28 — ABNORMAL LOW (ref 7.35–7.45)
pH, Ven: 7.27 — ABNORMAL LOW (ref 7.35–7.45)
pH, Ven: 7.27 — ABNORMAL LOW (ref 7.35–7.45)
pH, Ven: 7.27 — ABNORMAL LOW (ref 7.35–7.45)

## 2015-07-20 LAB — MAGNESIUM
Magnesium: 2.4 mg/dL (ref 1.80–2.40)
Magnesium: 2.4 mg/dL (ref 1.80–2.40)

## 2015-07-20 LAB — MYOGLOBIN, URINE: Myoglobin, Ur: 184 mg/L — ABNORMAL HIGH (ref 0–1)

## 2015-07-20 LAB — PHOSPHORUS
Phosphorus: 5 mg/dL — ABNORMAL HIGH (ref 2.5–4.9)
Phosphorus: 2.4 mg/dL — ABNORMAL LOW (ref 2.5–4.9)

## 2015-07-20 LAB — CK: Total CK: 86148 U/L — ABNORMAL HIGH (ref 39–308)

## 2015-07-20 MED ORDER — SODIUM CHLORIDE 0.9 % IV SOLN
0.9 % | INTRAVENOUS | Status: AC
Start: 2015-07-20 — End: 2015-07-19
  Administered 2015-07-20: 01:00:00

## 2015-07-20 MED ORDER — FENTANYL CITRATE (PF) 100 MCG/2ML IJ SOLN
100 MCG/2ML | INTRAMUSCULAR | Status: DC | PRN
Start: 2015-07-20 — End: 2015-07-29
  Administered 2015-07-20 – 2015-07-28 (×8): 50 ug via INTRAVENOUS

## 2015-07-20 MED ORDER — SODIUM CHLORIDE 0.9 % IV SOLN
0.9 % | INTRAVENOUS | Status: AC
Start: 2015-07-20 — End: 2015-07-20
  Administered 2015-07-20: 16:00:00 1000

## 2015-07-20 MED ORDER — SILVER NITRATE-POT NITRATE 75-25 % EX MISC
75-25 % | Freq: Once | CUTANEOUS | Status: AC
Start: 2015-07-20 — End: 2015-07-20
  Administered 2015-07-20: 13:00:00 via TOPICAL

## 2015-07-20 MED FILL — HEPARIN SODIUM (PORCINE) 1000 UNIT/ML IJ SOLN: 1000 UNIT/ML | INTRAMUSCULAR | Qty: 5

## 2015-07-20 MED FILL — SODIUM CHLORIDE 0.9 % IV SOLN: 0.9 % | INTRAVENOUS | Qty: 3000

## 2015-07-20 MED FILL — LEVETIRACETAM 500 MG PO TABS: 500 MG | ORAL | Qty: 1

## 2015-07-20 MED FILL — PRISMASATE BGK 4/2.5 DIALYSIS SOLUTION: INTRAVENOUS_CENTRAL | Qty: 5000

## 2015-07-20 MED FILL — HEPARIN SODIUM (PORCINE) 5000 UNIT/ML IJ SOLN: 5000 UNIT/ML | INTRAMUSCULAR | Qty: 1

## 2015-07-20 MED FILL — SODIUM CHLORIDE 0.9 % IV SOLN: 0.9 % | INTRAVENOUS | Qty: 2000

## 2015-07-20 MED FILL — FENTANYL CITRATE (PF) 100 MCG/2ML IJ SOLN: 100 MCG/2ML | INTRAMUSCULAR | Qty: 2

## 2015-07-20 MED FILL — CALCIUM GLUCONATE 10 % IV SOLN: 10 % | INTRAVENOUS | Qty: 10

## 2015-07-20 MED FILL — SODIUM BICARBONATE 8.4 % IV SOLN: 8.4 % | INTRAVENOUS | Qty: 75

## 2015-07-20 MED FILL — OXYCODONE-ACETAMINOPHEN 5-325 MG PO TABS: 5-325 MG | ORAL | Qty: 2

## 2015-07-20 MED FILL — DOK 100 MG PO CAPS: 100 MG | ORAL | Qty: 1

## 2015-07-20 MED FILL — AMPICILLIN-SULBACTAM SODIUM 1.5 (1-0.5) G IJ SOLR: 1.5 (1-0.5) g | INTRAMUSCULAR | Qty: 1.5

## 2015-07-20 MED FILL — GRAFCO SILVER NIT APPLICATOR 75-25 % EX MISC: 75-25 % | CUTANEOUS | Qty: 1

## 2015-07-20 NOTE — Plan of Care (Signed)
Problem: Falls - Risk of  Goal: Absence of falls  Outcome: Met This Shift  Pt. Calm and cooperative. Reinforce teaching about safety. Pt. Verbalized understanding. Bed alarm on. Fall light on. Kept comfortable. Plan of care discussed with pt. Call light within reach.         Problem: Risk for Impaired Skin Integrity  Goal: Tissue integrity - skin and mucous membranes  Structural intactness and normal physiological function of skin and  mucous membranes.   Outcome: Ongoing  Right arm and right leg surgical site dressing changes done today. Lots of drainage coming out of leg incisions. Right chest blisters are flat now. Given chlorhexidine bath. No new skin breakdown seen. Back looks good. Will monitor for new open areas. Turned and repositioned per protocol.     Problem: Pain:  Goal: Control of acute pain  Control of acute pain   Outcome: Ongoing  Pt. Given fentanyl iv with dressing changes. Pt. dozing on and off. C/o pain on legs and arm with turning. Kept warm and comfortable.     Problem: Infection - Surgical Site:  Intervention: Assess surgical site infection  Dressing vchanges done today. Sites look good, wound bed clean, pink and red with purple/blackish area from bleeding. Leg dressing draining serousanguinous output, no foul odor noted. Will monitor for signs of infection.

## 2015-07-20 NOTE — Progress Notes (Signed)
1930: TMP 390's CRRT alarming filter clotting/TMP high, endng therapy according to report called nephrology to verify  2008: Dr. Quillian Quince called for order verification ordered to continue CRRT overnght.  0500: Dressing changed done on upper leg. Lateral clean and pink/red, some purple areas. Medial side: began to change notice areas with large clot formation was leaving in place but while moving another areas was slightly displaced, small blood spurt from area, held pressure for could feel pulse no detectable bleeding from area, left dressing in place and rewrapped, with new saline saturated guaze.  Electronically signed by Lynn Ito, RN on 07/21/2015 at 7:54 AM

## 2015-07-20 NOTE — Plan of Care (Signed)
78 dr. Ramond Dial here. Pt. Repositioned, clots removed from the bleeding site, silver nitrate applied on the bleeding area. wound bed red, pink, with blackened areas noted. Dressing changed. Rectal temp inserted. Bed pad changed. Kept comfortable. Bed alarm on.   0803 full assessment done. ST on the monitor. VSS. CRRT ongoing. Denies pain, pt. sleeping, easily arousable. Breakfast tray ordered. Mother at bedside, updated.   0830 paige NP rounding. Updated. New orders received.    1040 CRRT alarmed TMP too high. 190 cc blood returned. New filter started.   1214 reassessment done, pt. Sleeping, easily arouses. VSS. Bed alarm on. Kept comfortable.   1400 right lower leg dressing changed, fentanyl ivp given. Pt. Turned and repositioned. Full linen change done. Chlorhexidine wipe down done. Foley care given.   1425 dr. Su Hoff here, seen and examined pt. Right arm dressing changed, site looks clean and dry, wound bed pink, staples in place. No bleeding noted. Will keep arm elevated with pillow. Change dressing in 2 days.   1622 VSS. Denies pain. Given cranberry juice. Refused to order dinner. CRRT ongoing. Will monitor.   1720 blood drawn and sent to lab.

## 2015-07-20 NOTE — Progress Notes (Signed)
Nephrology Progress Note  [phone]  [phone]   [email or web address]    Patient:  Roy Weaver   DOB: January 25, 1986    CC:  Found down    Subjective:  -pt seen and examined  -PMSHx and meds reviewed  -No family at bedside    Started on CRRT 07/19/2015 for hyperkalemia post op and -taken to OR for fasciotomy right forearm 07/19/2015  CK trending down  Cath functional with heparin   Solute clearance suboptimal  BP lower  UF +1L      ROS:   Pos pain/neg chest pain/SOB        Meds:  Current Facility-Administered Medications   Medication Dose Route Frequency Provider Last Rate Last Dose   ??? fentaNYL (SUBLIMAZE) injection 50 mcg  50 mcg Intravenous Q4H PRN Paige E Rudler, CNP       ??? sodium chloride flush 0.9 % injection 10 mL  10 mL Intravenous 2 times per day Filbert Berthold, MD   10 mL at 07/20/15 2956   ??? sodium chloride flush 0.9 % injection 10 mL  10 mL Intravenous PRN Filbert Berthold, MD       ??? acetaminophen (TYLENOL) tablet 650 mg  650 mg Oral Q4H PRN Filbert Berthold, MD       ??? oxyCODONE-acetaminophen (PERCOCET) 5-325 MG per tablet 1 tablet  1 tablet Oral Q4H PRN Filbert Berthold, MD   1 tablet at 07/19/15 1323    Or   ??? oxyCODONE-acetaminophen (PERCOCET) 5-325 MG per tablet 2 tablet  2 tablet Oral Q4H PRN Filbert Berthold, MD   2 tablet at 07/19/15 2206   ??? docusate sodium (COLACE) capsule 100 mg  100 mg Oral BID Filbert Berthold, MD   100 mg at 07/20/15 0837   ??? ondansetron (ZOFRAN) injection 4 mg  4 mg Intravenous Q6H PRN Filbert Berthold, MD       ??? prismaSATE BGK 4/2.5 dialysis solution   Dialysis Continuous Corky Downs, MD 2,000 mL/hr at 07/20/15 0921 2,000 mL/hr at 07/20/15 2130   ??? prismaSATE BGK 4/2.5 dialysis solution   Dialysis Continuous Corky Downs, MD 2,000 mL/hr at 07/20/15 0921 2,000 mL/hr at 07/20/15 8657   ??? prismaSATE BGK 4/2.5 dialysis solution   Dialysis Continuous Corky Downs, MD 1,000 mL/hr at 07/20/15 0653 1,000 mL/hr at 07/20/15 8469   ??? potassium chloride 20 mEq/50 mL IVPB (Central Line)  20 mEq  Intravenous PRN Corky Downs, MD       ??? magnesium sulfate 1 g in dextrose 5% 100 mL IVPB premix  1 g Intravenous PRN Corky Downs, MD       ??? calcium gluconate 1 g in dextrose 5 % 100 mL IVPB  1 g Intravenous PRN Corky Downs, MD   Stopped at 07/20/15 [phone]    Or   ??? calcium gluconate 2 g in dextrose 5 % 100 mL IVPB  2 g Intravenous PRN Corky Downs, MD        Or   ??? calcium gluconate 3 g in dextrose 5 % 100 mL IVPB  3 g Intravenous PRN Corky Downs, MD        Or   ??? calcium gluconate 4 g in dextrose 5 % 100 mL IVPB  4 g Intravenous PRN Corky Downs, MD       ??? sodium phosphate 6 mmol in dextrose 5 % 250 mL IVPB  6 mmol Intravenous PRN Arlys John  L Revis, MD        Or   ??? sodium phosphate 12 mmol in dextrose 5 % 250 mL IVPB  12 mmol Intravenous PRN Corky Downs, MD        Or   ??? sodium phosphate 18 mmol in dextrose 5 % 500 mL IVPB  18 mmol Intravenous PRN Corky Downs, MD        Or   ??? sodium phosphate 24 mmol in dextrose 5 % 500 mL IVPB  24 mmol Intravenous PRN Corky Downs, MD       ??? heparin (porcine) 5,000 Units in sodium chloride 0.9 % 20 mL infusion   Intravenous Continuous Deklan Minar Patel-Chamberlin, MD 3.2 mL/hr at 07/20/15 1007     ??? heparin (porcine) 5,000 Units in sodium chloride 0.9 % 1,000 mL infusion  25 mL/hr Dialysis Continuous Deva Ron Patel-Chamberlin, MD 25 mL/hr at 07/20/15 1040 25 mL/hr at 07/20/15 1040   ??? levETIRAcetam (KEPPRA) tablet 500 mg  500 mg Oral BID Kimbrely Buckel Patel-Chamberlin, MD   500 mg at 07/20/15 0837   ??? ampicillin-sulbactam (UNASYN) 1.5 g IVPB minibag  1.5 g Intravenous Q6H Evghenii Bacanurschi, MD   Stopped at 07/20/15 1140   ??? sodium bicarbonate 75 mEq in sodium chloride 0.45 % 1,000 mL infusion   Intravenous Continuous Corky Downs, MD 100 mL/hr at 07/20/15 1140         Vitals:  Visit Vitals   ??? BP (!) 91/55   ??? Pulse 118   ??? Temp 97.9 ??F (36.6 ??C) (Rectal)   ??? Resp 15   ??? Ht 6\' 4"  (1.93 m)   ??? Wt 194 lb 3.6 oz (88.1 kg)   ??? SpO2 100%   ??? BMI 23.64 kg/m2       Physical Exam:  Gen:  Resting in bed, NAD.    HEENT: MMM, OP clear. anciteric  CV: RRR no m/r/g.  No S3.  Lungs: CTA-B, no resp distress  Abd: S/NT +BS  Ext: No edema, no cyanosis-dressing to right arm intact, RLE dressing C/D/I  Skin: Warm.  No rashes appreciated.    Labs:  BMP:    Lab Results   Component Value Date    NA 139 07/20/2015    K 4.9 07/20/2015    CL 101 07/20/2015    CO2 24 07/20/2015    BUN 21 07/20/2015    LABALBU 2.0 07/20/2015    CREATININE 3.3 07/20/2015    CREATININE 1.0 08/17/2011    CALCIUM 7.1 07/20/2015    GFRAA 27 07/20/2015    GFRAA 157 08/19/2011    LABGLOM 22 07/20/2015    GLUCOSE 138 07/20/2015       Assessment/Plan:  AKI: due to rhabdomyolysis-initiated on CRRT ON due to hyperkalemia-cath dysfunctional -changed over guidewire-working well after heparin added  -vasc cath 07/18/2015-CRRT initiated on 07/19/2015  -anuric  -hemodynamically stable  -keep even   -continue sodium bicarbonate through today  -will transition to iHD in am  -if needs ongoing dialysis support by next week-will need TDC    FEN: hyperkalemia: 2/2 rhabdo and AKI-started on CRRT-improved  -plan to transition to iHD in am or if filter clots    Rhabdomyolysis: CK is improving    Transamnitis: ischemic injury, monitor trend    Leukcytosis: stable    compartment syndrome right: s/p fasciotomy right UE/LE  -per orthopedic surgery    Polysubstance abuse: per primary-SW referral when stable    Critical care time : (13:00-13:00)  D/w  nurse    Cathlean Sauer, MD  07/20/2015

## 2015-07-20 NOTE — Progress Notes (Signed)
Pulmonary Progress Note    CC:  Respiratory failure, compartment syndrome    Subjective:  Extubated yesterday  CK down to 86000  Bleeding from fasciotomy site, silver nitrate applied per vascular  On CRRT    ROS  Sleeping  No nausea    Intake/Output Summary (Last 24 hours) at 07/20/15 0734  Last data filed at 07/20/15 0705   Gross per 24 hour   Intake           3418.4 ml   Output             2799 ml   Net            619.4 ml         PHYSICAL EXAM:  Blood pressure 110/63, pulse 119, temperature 96.8 ??F (36 ??C), temperature source Axillary, resp. rate 17, height  (1.93 m), weight 194 lb 3.6 oz (88.1 kg), SpO2 100 %.'  Gen: No distress. Sleeping  Eyes: PERRL. No sclera icterus. No conjunctival injection.   ENT: No discharge.  Neck: Trachea midline. No obvious mass.   Resp: No accessory muscle use. No crackles. No wheezes. No rhonchi.   CV: Regular rate. Regular rhythm. No murmur or rub.  GI: Non-tender. Non-distended. No hernia. BS present.  Skin: Warm and dry. No nodule on exposed extremities. RUE and RLE incisions with dressings  Lymph: No cervical LAD. No supraclavicular LAD.   M/S: RUE swelling, RLE swelling. Compression wraps  Neuro: Awake. Alert. Moves all four extremities.   EXT: Edema along the right side, no clubbing    Medications:    Scheduled Meds:  ??? silver nitrate applicators   Topical Once   ??? sodium chloride flush  10 mL Intravenous 2 times per day   ??? docusate sodium  100 mg Oral BID   ??? levETIRAcetam  500 mg Oral BID   ??? ampicillin-sulbactam  1.5 g Intravenous Q6H       Continuous Infusions:  ??? prismaSATE BGK 4/2.5 2,000 mL/hr (07/20/15 1610)   ??? prismaSATE BGK 4/2.5 2,000 mL/hr (07/20/15 9604)   ??? prismaSATE BGK 4/2.5 1,000 mL/hr (07/20/15 5409)   ??? heparin 5000 units CRRT syringe 2.8 mL/hr at 07/20/15 8119   ??? heparin 5000 units in 0.9% sodium chloride 1000 mL 25 mL/hr (07/20/15 0352)   ??? sodium bicarbonate infusion 100 mL/hr at 07/20/15 0215   ??? propofol Stopped (07/19/15 0824)       PRN  Meds:  sodium chloride flush, acetaminophen, oxyCODONE-acetaminophen **OR** oxyCODONE-acetaminophen, ondansetron, potassium chloride, magnesium sulfate, calcium gluconate IVPB **OR** calcium gluconate IVPB **OR** calcium gluconate IVPB **OR** calcium gluconate IVPB, sodium phosphate IVPB **OR** sodium phosphate IVPB **OR** sodium phosphate IVPB **OR** sodium phosphate IVPB, HYDROmorphone    Labs:  CBC: Recent Labs      07/18/15   1215  07/19/15   0455  07/20/15   0357   WBC  17.6*  17.4*  14.0*   HGB  17.0  17.4  12.0*   HCT  52.0  52.6*  37.0*   MCV  91.1  89.7  90.0   PLT  260  205  125*     BMP: Recent Labs      07/19/15   0755  07/19/15   1305  07/19/15   1740  07/20/15   0019  07/20/15   0357   NA  132*  131*  132*  139  139   K  5.0  4.5  4.2  5.1  4.9  CL  97*  96*  94*  101  101   CO2  PHOS  4.1  5.2*   --   5.0*   --    BUN  27*  29*  13  23*  21*   CREATININE  3.9*  4.6*  1.7*  3.7*  3.3*     LIVER PROFILE:   Recent Labs      07/19/15   0455   07/19/15   1740  07/20/15   0019  07/20/15   0357   AST  1525*   < >  1463*  958*  930*   ALT  327*   < >  304*  198*  181*   BILIDIR  <0.2   --    --    --   <0.2   BILITOT  <0.2   < >  0.4  0.3  0.3   ALKPHOS  89   < >  99  67  64    < > = values in this interval not displayed.     PT/INR: No results for input(s): PROTIME, INR in the last 72 hours.  APTT:   Recent Labs      07/19/15   1740  07/20/15   0019  07/20/15   0404   APTT  70.3*  93.1*  57.7*     UA:  Recent Labs      07/18/15   1430   COLORU  DK YELLOW   PHUR  6.0   6.0   WBCUA  0   RBCUA  2   CLARITYU  Clear   SPECGRAV  1.020   LEUKOCYTESUR  Negative   UROBILINOGEN  0.2   BILIRUBINUR  Negative   BLOODU  LARGE*   GLUCOSEU  Negative     No results for input(s): PH, PCO2, PO2 in the last 72 hours.    PFTs:  None    ECHO:  None    ABG:  7.32/41/388  7.34/38/238    Chest X-ray:  Chest imaging was reviewed by me and showed ETT, and CVC in place. 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 Clear lung  fields    Assessment:   ?? Acute Hypoxic Respiratory Failure, resolved  ?? Acute Renal Failure  ?? Compartment Syndrome  ?? Rhabdomyolysis  ?? SIRS  ?? Drug Overdose    Plan:  ?? Titrate oxygen for saturations greater than or equal to 92%  ?? CRRT per Nephro  ?? HCO3 gtt  ?? Surgery and Ortho following  ?? DVT prophylaxis with heparin gtt  ?? Renal diet  ?? Cultures negative  ?? Antibiotics per vascular    Thank you for allowing me to participate in the care of this patient. Will follow    Glade Lloyd, ACNP  [facility] Pulmonary, Sleep, and Critical Care

## 2015-07-20 NOTE — Progress Notes (Signed)
0300: Dressing change began. Upper thigh moistened with sterile saline, when gently removing dressing blood began to spurt from muscle, pressure held, Dr. Ramond Dial vascular surgery called and informed, told to hold pressure and place gelatin sponge over bleeding area and rewrap, will come to see patient and will suture if need be.    0730: Dr. Ramond Dial at bedside to evaluate wound site, used silver nitrate on one site    Electronically signed by Lynn Ito, RN on 07/20/2015 at 7:42 AM

## 2015-07-20 NOTE — Progress Notes (Signed)
Hospitalist Progress Note  07/20/2015 9:03 AM    PCP: Baldwin Jamaica, MD    [id number]     Date of Admission: 07/18/2015                                                                                                                     HOSPITAL COURSE    Patient demographics:  The patient  Roy Weaver is a 30 y.o. male     Significant past medical history:   Patient Active Problem List   Diagnosis   ??? Seizures (HCC)   ??? Migraine   ??? Recurrent seizures (HCC)   ??? History of nonadherence to medical treatment   ??? Delirium   ??? Drug use   ??? Partial epilepsy with impairment of consciousness (HCC)   ??? Seizures (HCC)   ??? Partial symptomatic epilepsy with complex partial seizures, intractable, without status epilepticus (HCC)   ??? Noncompliance with medication regimen   ??? Aspiration pneumonia of right lung (HCC)   ??? 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 Seizure (HCC)   ??? Visit for suture removal   ??? Post-ictal state (HCC)   ??? Breakthrough seizure (HCC)   ??? Traumatic rhabdomyolysis (HCC)   ??? AKI (acute kidney injury) (HCC)   ??? 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 Non-traumatic compartment syndrome of right upper extremity   ??? Acute respiratory failure with hypoxia (HCC)         Presenting symptoms:  Found down at home.    Diagnostic workup:  XR Humerus Right Standard   XR Chest Portable   CT Head WO Contrast       CONSULTS DURING ADMISSION :   IP CONSULT TO HOSPITALIST  IP CONSULT TO NEPHROLOGY  IP CONSULT TO ORTHOPEDIC SURGERY  IP CONSULT TO GENERAL SURGERY  IP CONSULT TO VASCULAR SURGERY      Patient was diagnosed with:  Drug Overdose      Treatment while inpatient:                                Procedures:                                                       ---------------------------------------------------------------------------      SUBJECTIVE COMPLAINTS- No new complaints    Diet: Dietary Nutrition Supplements: Standard High Calorie Oral  Supplement  DIET RENAL;      OBJECTIVE:   Patient Active Problem List   Diagnosis   ??? Seizures (HCC)   ??? Migraine   ??? Recurrent seizures (HCC)   ??? History of nonadherence to medical treatment   ??? Delirium   ??? Drug use   ??? Partial epilepsy with impairment  of consciousness (HCC)   ??? Seizures (HCC)   ??? Partial symptomatic epilepsy with complex partial seizures, intractable, without status epilepticus (HCC)   ??? Noncompliance with medication regimen   ??? Aspiration pneumonia of right lung (HCC)   ??? 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 Seizure (HCC)   ??? Visit for suture removal   ??? Post-ictal state (HCC)   ??? Breakthrough seizure (HCC)   ??? Traumatic rhabdomyolysis (HCC)   ??? AKI (acute kidney injury) (HCC)   ??? 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 Non-traumatic compartment syndrome of right upper extremity   ??? Acute respiratory failure with hypoxia (HCC)       Allergies  Review of patient's allergies indicates no known allergies.    Medications    Scheduled Meds:  ??? sodium chloride flush  10 mL Intravenous 2 times per day   ??? docusate sodium  100 mg Oral BID   ??? levETIRAcetam  500 mg Oral BID   ??? ampicillin-sulbactam  1.5 g Intravenous Q6H     Continuous Infusions:  ??? prismaSATE BGK 4/2.5 2,000 mL/hr (07/20/15 4540)   ??? prismaSATE BGK 4/2.5 2,000 mL/hr (07/20/15 9811)   ??? prismaSATE BGK 4/2.5 1,000 mL/hr (07/20/15 9147)   ??? heparin 5000 units CRRT syringe 2.8 mL/hr at 07/20/15 8295   ??? heparin 5000 units in 0.9% sodium chloride 1000 mL 25 mL/hr (07/20/15 0352)   ??? sodium bicarbonate infusion 100 mL/hr at 07/20/15 0215     PRN Meds:  fentanNYL, sodium chloride flush, acetaminophen, oxyCODONE-acetaminophen **OR** oxyCODONE-acetaminophen, ondansetron, potassium chloride, magnesium sulfate, calcium gluconate IVPB **OR** calcium gluconate IVPB **OR** calcium gluconate IVPB **OR** calcium gluconate IVPB, sodium phosphate IVPB **OR** sodium phosphate IVPB **OR**  sodium phosphate IVPB **OR** sodium phosphate IVPB    Vitals   Vitals /wt   Patient Vitals for the past 8 hrs:   BP Temp Temp src Pulse Resp SpO2 Weight   07/20/15 0803 (!) 86/56 95.9 ??F (35.5 ??C) Rectal 109 15 100 % -   07/20/15 0703 (!) 86/56 - - 109 17 100 % -   07/20/15 0602 (!) 86/47 - - 113 10 99 % -   07/20/15 0522 - 96.8 ??F (36 ??C) Axillary - - - -   07/20/15 0502 95/73 - - 110 15 100 % -   07/20/15 0402 110/63 96.5 ??F (35.8 ??C) Axillary 119 17 100 % 194 lb 3.6 oz (88.1 kg)   07/20/15 0300 102/61 - - 108 15 100 % -   07/20/15 0200 94/70 - - 120 18 98 % -   07/20/15 0113 - - - - 13 100 % -        72HR INTAKE/OUTPUT:      Intake/Output Summary (Last 24 hours) at 07/20/15 0903  Last data filed at 07/20/15 0837   Gross per 24 hour   Intake             3646 ml   Output             2996 ml   Net              650 ml       Exam:    Gen:   Alert and oriented ??3   Eyes: PERRL. No sclera icterus. No conjunctival injection.   ENT: No discharge. Pharynx clear. External appearance of ears and nose normal.  Neck: Trachea midline. No obvious mass.    Resp:  Clear to auscultation bilaterally  CV: 2/6 systolic murmur with a tachycardia  GI: Non-tender. Non-distended. No hernia.   Skin: Warm, dry, normal texture and turgor.   Lymph: No cervical LAD. No supraclavicular LAD.   M/S: / Ext. No cyanosis. No clubbing. No joint deformity.    Neuro: CN 2-12 are intact,  no neurologic deficits noted.    PT/INR: No results for input(s): PROTIME, INR in the last 72 hours.  APTT:   Recent Labs      07/19/15   1740  07/20/15   0019  07/20/15   0404   APTT  70.3*  93.1*  57.7*       CBC:   Recent Labs      07/18/15   1215  07/19/15   0455  07/20/15   0357   WBC  17.6*  17.4*  14.0*   HGB  17.0  17.4  12.0*   HCT  52.0  52.6*  37.0*   MCV  91.1  89.7  90.0   PLT  260  205  125*       BMP:   Recent Labs      07/19/15   0455  07/19/15   0755  07/19/15   1305  07/19/15   1740  07/20/15   0019  07/20/15   0357   NA  134*   132*  132*  131*  132*   139  139   K  5.0   5.0  5.0  4.5  4.2  5.1  4.9   CL  99   98*  97*  96*  94*  101  101   CO2  21   21  21  22  25  23  24    PHOS  3.6  4.1  5.2*   --   5.0*   --    BUN  29*   28*  27*  29*  13  23*  21*   CREATININE  4.0*   4.0*  3.9*  4.6*  1.7*  3.7*  3.3*       LIVER PROFILE:   Recent Labs      07/19/15   0455  07/19/15   1305  07/19/15   1740  07/20/15   0019  07/20/15   0357   ALKPHOS  89  75  99  67  64   AST  1525*  1090*  1463*  958*  930*   ALT  327*  235*  304*  198*  181*   BILIDIR  <0.2   --    --    --   <0.2   BILITOT  <0.2  <0.2  0.4  0.3  0.3     No results for input(s): AMYLASE in the last 72 hours.  No results for input(s): LIPASE in the last 72 hours.    UA:  Recent Labs      07/18/15   1430   WBCUA  0   RBCUA  2       TROPONIN:   Recent Labs      07/18/15   2250  07/19/15   0455  07/20/15   0357   CKTOTAL  262400*  161096*  04540*       Lab Results   Component Value Date/Time    URRFLXCULT Not Indicated 07/18/2015 02:30 PM       No results for input(s): TSHREFLEX in the last 72 hours.  No components found for: LAB5370  POC GLUCOSE:  No results for input(s): POCGLU in the last 72 hours.  No results for input(s): LABA1C in the last 72 hours. No results found for: LABA1C      ASSESSMENT AND PLAN  ??  Severe Rhabdomyolysis - CK 200K+ on admit now 86K  IVF with Bicarb 125cc/hr + NS at 75cc/hr - to run at 200cc total rate.   Nephrology is following    Acute renal failure- CRRT  ??    Hyperkalemia -Resolved    ??  AGMA - LA, elevated BUN.   Resolved  ??  ??  Abnormal LFT - transaminitis with normal bilirubin.   Likely drug toxic effect and rhabdo.   Has Hx of HepC - untreated.   Enzymes are improving    ??  ??  compartment syndrome - RUE and R thigh.   S/p fasciotomy by ortho   Continue on Unasyn and vancomycin discontinued since MRSA screen is negative??  ??  Seizure d/o -  Cont keppra.   ??  ??  Acute toxic encephalopathy -   due to drug overdose.   Resolved    ??Drug Overdose  Prior + for cocaine, opiates  and cannabis.   ??      Code Status: Full Code        Dispo - cc        The patient and / or the family were informed of the results of any tests, a time was given to answer questions, a plan was proposed and they agreed with plan.    Lanett Lasorsa Modena Morrow, MD

## 2015-07-20 NOTE — Progress Notes (Signed)
POD # 2, s/p decompression fasciotomy right forearm.    Pt comfortable, no c/o. Extubated, in ICU, on dialysis.  Drsg right forearm D/C/I, Chnged, looks good  No significant pain with right wrist ROM, much improved ulnar nerve motor function, good radial pulse right wrist.          CBC:   Lab Results   Component Value Date    WBC 14.0 07/20/2015    RBC 4.11 07/20/2015    HGB 12.0 07/20/2015    HCT 37.0 07/20/2015    MCV 90.0 07/20/2015    MCH 29.2 07/20/2015    MCHC 32.4 07/20/2015    RDW 13.3 07/20/2015    PLT 125 07/20/2015    MPV 8.5 07/20/2015     PT/INR:    Lab Results   Component Value Date    PROTIME 11.7 03/29/2014    INR 1.03 03/29/2014     PTT:    Lab Results   Component Value Date    APTT 70.4 07/20/2015   [APTT    A/P: s/p decompression fasciotomy right forearm.  - Stable  - Elevation right wrist  - Change Drsg every 2 days, right forearm.  - We will remove the staples and vessel loop end of next week    Filbert Berthold , MD 07/20/2015 9:50 am

## 2015-07-20 NOTE — Progress Notes (Signed)
Vascular Surgery Progress Note      SUBJECTIVE:  Able to move R Toes, dorsiflex ankle, sensation to light touch    OBJECTIVE    Physical  CURRENT VITALS:    Visit Vitals   ??? BP 110/63   ??? Pulse 119   ??? Temp 96.8 ??F (36 ??C) (Axillary)   ??? Resp 17   ??? Ht  (1.93 m)   ??? Wt 194 lb 3.6 oz (88.1 kg)   ??? SpO2 100%   ??? BMI 23.64 kg/m2     24 HR INTAKE/OUTPUT:    Intake/Output Summary (Last 24 hours) at 07/20/15 0735  Last data filed at 07/20/15 0705   Gross per 24 hour   Intake           3418.4 ml   Output             2799 ml   Net            619.4 ml     Muscles viable, small bleeder cauterized with NTG.  Palp pedal pulse    Data  CBC:   Lab Results   Component Value Date    WBC 14.0 07/20/2015    RBC 4.11 07/20/2015    HGB 12.0 07/20/2015    HCT 37.0 07/20/2015    MCV 90.0 07/20/2015    MCH 29.2 07/20/2015    MCHC 32.4 07/20/2015    RDW 13.3 07/20/2015    PLT 125 07/20/2015    MPV 8.5 07/20/2015     BMP:    Lab Results   Component Value Date    NA 139 07/20/2015    K 4.9 07/20/2015    CL 101 07/20/2015    CO2 24 07/20/2015    BUN 21 07/20/2015    LABALBU 2.0 07/20/2015    CREATININE 3.3 07/20/2015    CREATININE 1.0 08/17/2011    CALCIUM 7.1 07/20/2015    GFRAA 27 07/20/2015    GFRAA 157 08/19/2011    LABGLOM 22 07/20/2015    GLUCOSE 138 07/20/2015     CK:  86K      ASSESSMENT AND PLAN    S/P Extensive fasciotomies- continue dressing changes today.  Wound VAC's tomorrow.  CK trending down.  Improving Neuro function RLE.

## 2015-07-21 LAB — PHOSPHORUS
Phosphorus: 3.2 mg/dL (ref 2.5–4.9)
Phosphorus: 2.5 mg/dL (ref 2.5–4.9)

## 2015-07-21 LAB — HEPATIC FUNCTION PANEL
ALT: 270 U/L — ABNORMAL HIGH (ref 10–40)
AST: 1005 U/L — ABNORMAL HIGH (ref 15–37)
Albumin: 2.2 g/dL — ABNORMAL LOW (ref 3.4–5.0)
Alkaline Phosphatase: 52 U/L (ref 40–129)
Bilirubin, Direct: <0.2 mg/dL (ref 0.0–0.3)
Total Bilirubin: 0.3 mg/dL (ref 0.0–1.0)
Total Protein: 4.8 g/dL — ABNORMAL LOW (ref 6.4–8.2)

## 2015-07-21 LAB — COMPREHENSIVE METABOLIC PANEL
ALT: 277 U/L — ABNORMAL HIGH (ref 10–40)
ALT: 202 U/L — ABNORMAL HIGH (ref 10–40)
AST: 1052 U/L — ABNORMAL HIGH (ref 15–37)
AST: 785 U/L — ABNORMAL HIGH (ref 15–37)
Albumin/Globulin Ratio: 0.9 — ABNORMAL LOW (ref 1.1–2.2)
Albumin/Globulin Ratio: 0.8 — ABNORMAL LOW (ref 1.1–2.2)
Albumin/Globulin Ratio: 1 — ABNORMAL LOW (ref 1.1–2.2)
Albumin: 2.2 g/dL — ABNORMAL LOW (ref 3.4–5.0)
Albumin: 2.1 g/dL — ABNORMAL LOW (ref 3.4–5.0)
Alkaline Phosphatase: 52 U/L (ref 40–129)
Alkaline Phosphatase: 44 U/L (ref 40–129)
Anion Gap: 11 (ref 3–16)
Anion Gap: 12 (ref 3–16)
Anion Gap: 10 (ref 3–16)
BUN: 20 mg/dL (ref 7–20)
BUN: 19 mg/dL (ref 7–20)
BUN: 16 mg/dL (ref 7–20)
CO2: 25 mmol/L (ref 21–32)
CO2: 25 mmol/L (ref 21–32)
CO2: 26 mmol/L (ref 21–32)
Calcium: 7.1 mg/dL — ABNORMAL LOW (ref 8.3–10.6)
Calcium: 7.2 mg/dL — ABNORMAL LOW (ref 8.3–10.6)
Calcium: 7.3 mg/dL — ABNORMAL LOW (ref 8.3–10.6)
Chloride: 100 mmol/L (ref 99–110)
Chloride: 98 mmol/L — ABNORMAL LOW (ref 99–110)
Chloride: 100 mmol/L (ref 99–110)
Creatinine: 2.8 mg/dL — ABNORMAL HIGH (ref 0.9–1.3)
Creatinine: 2.7 mg/dL — ABNORMAL HIGH (ref 0.9–1.3)
Creatinine: 2.1 mg/dL — ABNORMAL HIGH (ref 0.9–1.3)
GFR African American: 33 — AB (ref >60)
GFR African American: 34 — AB (ref >60)
GFR African American: 46 — AB (ref >60)
GFR Non-African American: 27 — AB (ref >60)
GFR Non-African American: 28 — AB (ref >60)
GFR Non-African American: 38 — AB (ref >60)
Globulin: 2.5 g/dL
Globulin: 2.6 g/dL
Globulin: 2.2 g/dL
Glucose: 129 mg/dL — ABNORMAL HIGH (ref 70–99)
Glucose: 119 mg/dL — ABNORMAL HIGH (ref 70–99)
Glucose: 116 mg/dL — ABNORMAL HIGH (ref 70–99)
Potassium: 4.8 mmol/L (ref 3.5–5.1)
Potassium: 4.7 mmol/L (ref 3.5–5.1)
Potassium: 4.5 mmol/L (ref 3.5–5.1)
Sodium: 136 mmol/L (ref 136–145)
Sodium: 135 mmol/L — ABNORMAL LOW (ref 136–145)
Sodium: 136 mmol/L (ref 136–145)
Total Bilirubin: <0.2 mg/dL (ref 0.0–1.0)
Total Bilirubin: 0.3 mg/dL (ref 0.0–1.0)
Total Protein: 4.7 g/dL — ABNORMAL LOW (ref 6.4–8.2)
Total Protein: 4.3 g/dL — ABNORMAL LOW (ref 6.4–8.2)

## 2015-07-21 LAB — APTT
aPTT: 87.3 s — ABNORMAL HIGH (ref 21.0–31.8)
aPTT: 69.2 s — ABNORMAL HIGH (ref 21.0–31.8)
aPTT: 77.3 s — ABNORMAL HIGH (ref 21.0–31.8)
aPTT: 95.9 s — ABNORMAL HIGH (ref 21.0–31.8)
aPTT: 137.5 s (ref 21.0–31.8)

## 2015-07-21 LAB — CBC WITH AUTO DIFFERENTIAL
Basophils %: 0.2 %
Basophils Absolute: 0 10*3/uL (ref 0.0–0.2)
Eosinophils %: 0 %
Eosinophils Absolute: 0 10*3/uL (ref 0.0–0.6)
Hematocrit: 26.8 % — ABNORMAL LOW (ref 40.5–52.5)
Hemoglobin: 9.2 g/dL — ABNORMAL LOW (ref 13.5–17.5)
Lymphocytes %: 12.2 %
Lymphocytes Absolute: 1.6 10*3/uL (ref 1.0–5.1)
MCH: 30 pg (ref 26.0–34.0)
MCHC: 34.1 g/dL (ref 31.0–36.0)
MCV: 87.9 fL (ref 80.0–100.0)
MPV: 8.9 fL (ref 5.0–10.5)
Monocytes %: 9.2 %
Monocytes Absolute: 1.2 10*3/uL (ref 0.0–1.3)
Neutrophils %: 78.4 %
Neutrophils Absolute: 10.4 10*3/uL — ABNORMAL HIGH (ref 1.7–7.7)
Platelets: 111 10*3/uL — ABNORMAL LOW (ref 135–450)
RBC: 3.05 M/uL — ABNORMAL LOW (ref 4.20–5.90)
RDW: 13.3 % (ref 12.4–15.4)
WBC: 13.3 10*3/uL — ABNORMAL HIGH (ref 4.0–11.0)

## 2015-07-21 LAB — SPECIMEN REJECTION

## 2015-07-21 LAB — CALCIUM, IONIZED
Calcium, Ionized: 1.05 mmol/L — ABNORMAL LOW (ref 1.12–1.32)
Calcium, Ionized: 1.04 mmol/L — ABNORMAL LOW (ref 1.12–1.32)
Calcium, Ionized: 1.05 mmol/L — ABNORMAL LOW (ref 1.12–1.32)
pH, Ven: 7.34 — ABNORMAL LOW (ref 7.35–7.45)
pH, Ven: 7.35 (ref 7.35–7.45)
pH, Ven: 7.38 (ref 7.35–7.45)

## 2015-07-21 LAB — MAGNESIUM
Magnesium: 2.2 mg/dL (ref 1.80–2.40)
Magnesium: 2.5 mg/dL — ABNORMAL HIGH (ref 1.80–2.40)

## 2015-07-21 LAB — EKG 12-LEAD
Atrial Rate: 103 {beats}/min
P Axis: -12 degrees
P-R Interval: 176 ms
Q-T Interval: 320 ms
QRS Duration: 80 ms
QTc Calculation (Bazett): 419 ms
R Axis: 92 degrees
T Axis: 66 degrees
Ventricular Rate: 103 {beats}/min

## 2015-07-21 LAB — CK: Total CK: 52485 U/L — ABNORMAL HIGH (ref 39–308)

## 2015-07-21 MED ORDER — HEPARIN SODIUM (PORCINE) 1000 UNIT/ML IJ SOLN
1000 UNIT/ML | Freq: Once | INTRAMUSCULAR | Status: AC | PRN
Start: 2015-07-21 — End: 2015-07-21
  Administered 2015-07-21: 22:00:00 1000 [IU]

## 2015-07-21 MED ORDER — SODIUM CHLORIDE 0.9 % IV SOLN
0.9 % | INTRAVENOUS | Status: AC
Start: 2015-07-21 — End: 2015-07-20
  Administered 2015-07-21: 03:00:00

## 2015-07-21 MED ORDER — CULTURELLE PO CAPS
Freq: Two times a day (BID) | ORAL | Status: DC
Start: 2015-07-21 — End: 2015-07-22
  Administered 2015-07-21 – 2015-07-22 (×3): 1 via ORAL

## 2015-07-21 MED ORDER — MORPHINE SULFATE (PF) 2 MG/ML IV SOLN
2 MG/ML | Freq: Once | INTRAVENOUS | Status: AC
Start: 2015-07-21 — End: 2015-07-21

## 2015-07-21 MED ORDER — HEPARIN SODIUM (PORCINE) 1000 UNIT/ML IJ SOLN
1000 UNIT/ML | INTRAMUSCULAR | Status: AC
Start: 2015-07-21 — End: 2015-07-21

## 2015-07-21 MED ORDER — SILVER NITRATE-POT NITRATE 75-25 % EX MISC
75-25 % | Freq: Once | CUTANEOUS | Status: DC
Start: 2015-07-21 — End: 2015-07-22

## 2015-07-21 MED ORDER — SODIUM CHLORIDE 0.9 % IV SOLN
0.9 % | INTRAVENOUS | Status: AC
Start: 2015-07-21 — End: 2015-07-21

## 2015-07-21 MED ORDER — MORPHINE SULFATE (PF) 2 MG/ML IV SOLN
2 MG/ML | INTRAVENOUS | Status: AC
Start: 2015-07-21 — End: 2015-07-21
  Administered 2015-07-21: 19:00:00 2 via INTRAVENOUS

## 2015-07-21 MED ORDER — HEPARIN SODIUM (PORCINE) 1000 UNIT/ML IJ SOLN
1000 UNIT/ML | Freq: Once | INTRAMUSCULAR | Status: AC | PRN
Start: 2015-07-21 — End: 2015-07-20
  Administered 2015-07-21: 01:00:00 3000 [IU]

## 2015-07-21 MED FILL — HEPARIN SODIUM (PORCINE) 5000 UNIT/ML IJ SOLN: 5000 UNIT/ML | INTRAMUSCULAR | Qty: 1

## 2015-07-21 MED FILL — HEPARIN SODIUM (PORCINE) 1000 UNIT/ML IJ SOLN: 1000 UNIT/ML | INTRAMUSCULAR | Qty: 5

## 2015-07-21 MED FILL — HEPARIN SODIUM (PORCINE) 1000 UNIT/ML IJ SOLN: 1000 UNIT/ML | INTRAMUSCULAR | Qty: 10

## 2015-07-21 MED FILL — SODIUM BICARBONATE 8.4 % IV SOLN: 8.4 % | INTRAVENOUS | Qty: 75

## 2015-07-21 MED FILL — LEVETIRACETAM 500 MG PO TABS: 500 MG | ORAL | Qty: 1

## 2015-07-21 MED FILL — PRISMASATE BGK 4/2.5 DIALYSIS SOLUTION: INTRAVENOUS_CENTRAL | Qty: 5000

## 2015-07-21 MED FILL — FENTANYL CITRATE (PF) 100 MCG/2ML IJ SOLN: 100 MCG/2ML | INTRAMUSCULAR | Qty: 2

## 2015-07-21 MED FILL — AMPICILLIN-SULBACTAM SODIUM 1.5 (1-0.5) G IJ SOLR: 1.5 (1-0.5) g | INTRAMUSCULAR | Qty: 1.5

## 2015-07-21 MED FILL — CULTURELLE PO CAPS: ORAL | Qty: 1

## 2015-07-21 MED FILL — DOK 100 MG PO CAPS: 100 MG | ORAL | Qty: 1

## 2015-07-21 MED FILL — CALCIUM GLUCONATE 10 % IV SOLN: 10 % | INTRAVENOUS | Qty: 10

## 2015-07-21 MED FILL — OXYCODONE-ACETAMINOPHEN 5-325 MG PO TABS: 5-325 MG | ORAL | Qty: 2

## 2015-07-21 MED FILL — MORPHINE SULFATE (PF) 2 MG/ML IV SOLN: 2 mg/mL | INTRAVENOUS | Qty: 1

## 2015-07-21 MED FILL — GRAFCO SILVER NIT APPLICATOR 75-25 % EX MISC: 75-25 % | CUTANEOUS | Qty: 1

## 2015-07-21 MED FILL — OXYCODONE-ACETAMINOPHEN 5-325 MG PO TABS: 5-325 MG | ORAL | Qty: 1

## 2015-07-21 MED FILL — SODIUM CHLORIDE 0.9 % IV SOLN: 0.9 % | INTRAVENOUS | Qty: 1000

## 2015-07-21 MED FILL — SODIUM CHLORIDE 0.9 % IV SOLN: 0.9 % | INTRAVENOUS | Qty: 3000

## 2015-07-21 NOTE — Progress Notes (Signed)
Nephrology Progress Note  [phone]  [phone]   [email or web address]    Patient:  Roy Weaver   DOB: 1985/12/21    CC:  Found down    Subjective:  -pt seen and examined  -PMSHx and meds reviewed  -No family at bedside    Started on CRRT  for hyperkalemia post op and -taken to OR for fasciotomy right forearm 07/19/2015  CK trending down  BP stable  Extubated  Remains oliguric    ROS:    pain controlled neg chest pain/SOB        Meds:  Current Facility-Administered Medications   Medication Dose Route Frequency Provider Last Rate Last Dose   ??? sodium chloride 0.9 % infusion            ??? lactobacillus (CULTURELLE) capsule 1 capsule  1 capsule Oral BID WC Markus Jarvis, MD   1 capsule at 07/21/15 0909   ??? fentaNYL (SUBLIMAZE) injection 50 mcg  50 mcg Intravenous Q4H PRN Loann Quill, NP   50 mcg at 07/20/15 1345   ??? sodium chloride flush 0.9 % injection 10 mL  10 mL Intravenous 2 times per day Filbert Berthold, MD   10 mL at 07/21/15 0907   ??? sodium chloride flush 0.9 % injection 10 mL  10 mL Intravenous PRN Filbert Berthold, MD       ??? acetaminophen (TYLENOL) tablet 650 mg  650 mg Oral Q4H PRN Filbert Berthold, MD       ??? oxyCODONE-acetaminophen (PERCOCET) 5-325 MG per tablet 1 tablet  1 tablet Oral Q4H PRN Filbert Berthold, MD   1 tablet at 07/21/15 0640    Or   ??? oxyCODONE-acetaminophen (PERCOCET) 5-325 MG per tablet 2 tablet  2 tablet Oral Q4H PRN Filbert Berthold, MD   2 tablet at 07/19/15 2206   ??? docusate sodium (COLACE) capsule 100 mg  100 mg Oral BID Filbert Berthold, MD   100 mg at 07/21/15 2956   ??? ondansetron (ZOFRAN) injection 4 mg  4 mg Intravenous Q6H PRN Filbert Berthold, MD       ??? prismaSATE BGK 4/2.5 dialysis solution   Dialysis Continuous Mukti Patel-Chamberlin, MD 2,500 mL/hr at 07/21/15 1010 2,500 mL/hr at 07/21/15 1010   ??? prismaSATE BGK 4/2.5 dialysis solution   Dialysis Continuous Mukti Patel-Chamberlin, MD 1,500 mL/hr at 07/21/15 0759 1,500 mL/hr at 07/21/15 0759   ??? prismaSATE BGK 4/2.5 dialysis  solution   Dialysis Continuous Corky Downs, MD 1,000 mL/hr at 07/21/15 0612 1,000 mL/hr at 07/21/15 2130   ??? potassium chloride 20 mEq/50 mL IVPB (Central Line)  20 mEq Intravenous PRN Corky Downs, MD       ??? magnesium sulfate 1 g in dextrose 5% 100 mL IVPB premix  1 g Intravenous PRN Corky Downs, MD       ??? calcium gluconate 1 g in dextrose 5 % 100 mL IVPB  1 g Intravenous PRN Corky Downs, MD   Stopped at 07/21/15 0233    Or   ??? calcium gluconate 2 g in dextrose 5 % 100 mL IVPB  2 g Intravenous PRN Corky Downs, MD        Or   ??? calcium gluconate 3 g in dextrose 5 % 100 mL IVPB  3 g Intravenous PRN Corky Downs, MD        Or   ??? calcium gluconate 4 g in dextrose 5 % 100  mL IVPB  4 g Intravenous PRN Corky Downs, MD       ??? sodium phosphate 6 mmol in dextrose 5 % 250 mL IVPB  6 mmol Intravenous PRN Corky Downs, MD        Or   ??? sodium phosphate 12 mmol in dextrose 5 % 250 mL IVPB  12 mmol Intravenous PRN Corky Downs, MD        Or   ??? sodium phosphate 18 mmol in dextrose 5 % 500 mL IVPB  18 mmol Intravenous PRN Corky Downs, MD        Or   ??? sodium phosphate 24 mmol in dextrose 5 % 500 mL IVPB  24 mmol Intravenous PRN Corky Downs, MD       ??? heparin (porcine) 5,000 Units in sodium chloride 0.9 % 20 mL infusion   Intravenous Continuous Mukti Patel-Chamberlin, MD 4.4 mL/hr at 07/21/15 0517     ??? heparin (porcine) 5,000 Units in sodium chloride 0.9 % 1,000 mL infusion  25 mL/hr Dialysis Continuous Mukti Patel-Chamberlin, MD 25 mL/hr at 07/21/15 0518 25 mL/hr at 07/21/15 0518   ??? levETIRAcetam (KEPPRA) tablet 500 mg  500 mg Oral BID Mukti Patel-Chamberlin, MD   500 mg at 07/21/15 0906   ??? ampicillin-sulbactam (UNASYN) 1.5 g IVPB minibag  1.5 g Intravenous Q6H Evghenii Bacanurschi, MD 100 mL/hr at 07/21/15 1035 1.5 g at 07/21/15 1035   ??? sodium bicarbonate 75 mEq in sodium chloride 0.45 % 1,000 mL infusion   Intravenous Continuous Corky Downs, MD 100 mL/hr at 07/21/15 1014         Vitals:  Visit Vitals    ??? BP 113/63   ??? Pulse 102   ??? Temp 97.3 ??F (36.3 ??C) (Rectal)   ??? Resp 13   ??? Ht  (1.93 m)   ??? Wt 190 lb 11.2 oz (86.5 kg)   ??? SpO2 100%   ??? BMI 23.21 kg/m2       Physical Exam:  Gen: Resting in bed, NAD.    HEENT: MMM, OP clear.  R IJ Vasc cath in place  CV: RRR no m/r/g.  No S3.  Lungs: CTA-B, no resp distress  Abd: S/NT +BS  Ext: No edema, no cyanosis-dressing to right arm intact, RLE dressing C/D/I  Skin: Warm.  No rashes appreciated.    Labs:  BMP:    Lab Results   Component Value Date    NA 135 07/21/2015    K 4.7 07/21/2015    CL 98 07/21/2015    CO2 25 07/21/2015    BUN 19 07/21/2015    LABALBU 2.2 07/21/2015    CREATININE 2.7 07/21/2015    CREATININE 1.0 08/17/2011    CALCIUM 7.2 07/21/2015    GFRAA 34 07/21/2015    GFRAA 157 08/19/2011    LABGLOM 28 07/21/2015    GLUCOSE 119 07/21/2015       Assessment/Plan:  AKI: due to rhabdomyolysis-initiated on CRRT ON due to hyperkalemia  -hemodynamically stable  -will continue CRRT until system goes down, switch to IHD as hemodynamically stable    FEN: hyperkalemia: 2/2 rhabdo  on CRRT-improved    Rhabdomyolysis:  With compartment syndrome S/P fasciotomies CK is improving 52 K today    Transamnitis: ischemic injury, stable    Leukcytosis: stable    compartment syndrome right: s/p fasciotomy  -per orthopedic surgery    Polysubstance abuse: per primary-SW referral when stable  Earl Gala, MD  07/21/2015

## 2015-07-21 NOTE — Plan of Care (Signed)
Problem: Nutrition  Goal: Optimal nutrition therapy  Outcome: Ongoing  Nutrition Problem: Increased nutrient needs  Intervention: Food and/or Nutrient Delivery: Continue current diet, Modify current ONS (increase ensure enlive to TID, monitor renal labs/HD, need to change to [place] )  Nutritional Goals: Pt will consume > 50% of meals and ONS

## 2015-07-21 NOTE — Plan of Care (Signed)
Problem: Falls - Risk of  Goal: Absence of falls  Outcome: Ongoing  Fall precautions are in place, no new falls during shift at this time.    Problem: Infection - Central Venous Catheter-Associated Bloodstream Infection:  Goal: Will show no infection signs and symptoms  Will show no infection signs and symptoms   Outcome: Ongoing  Monitoring signs and symptoms of infections, sterile technique when accessing vas cath for dialysis.    Problem: Urinary Elimination:  Goal: Complications related to the disease process, condition or treatment will be avoided or minimized  Complications related to the disease process, condition or treatment will be avoided or minimized   Outcome: Ongoing  No urine output observed.

## 2015-07-21 NOTE — Progress Notes (Signed)
Occupational Therapy   Occupational Therapy Initial Assessment  Date: 07/21/2015   Patient Name: Roy Weaver  MRN: [id number]     DOB: [birth year]     Patient with diagnoses of Heroin Overdose, Traumatic Rhabdomyolysis, R UE/LE Compartment Syndromes, AKI  on CRRT,  07/18/15 R Forearm Fasciotomy of Flexor Compartment and R Thigh/Calf Fasciotomies, Acute toxic encephalopathy demonstrating decreased functional status and is indicated for skilled OT services. Will continue to assess for discharge planning.  Probable need for continued therapy after discharge.    Treatment Diagnosis: Weakness, Decreased ADLs and Mobility and Decreased functional use of RUE      Restrictions  Restrictions/Precautions  Restrictions/Precautions: Fall Risk, Weight Bearing  Lower Extremity Weight Bearing Restrictions  Right Lower Extremity Weight Bearing: Non Weight Bearing  Upper Extremity Weight Bearing Restrictions  Right Upper Extremity Weight Bearing: Non Weight Bearing  Position Activity Restriction  Other position/activity restrictions: CRRT - R IJ Vascath (Clavicular Region).   Fasciotomy R Forearm, R Thigh, and R Calf regions. Wound Vac R LE Heroin Abuse.  Strict I/O    Subjective   General  Chart Reviewed: Yes  Patient assessed for rehabilitation services?: Yes  Additional Pertinent Hx: Seizure, Aspirative Pneumonia, Delirium, Migraine, AKI, Active Smoker ~ 1 PPD  Referring Practitioner: Filbert Berthold, MD  Diagnosis: Heroin Overdose, Traumatic Rhabdomyolysis, R UE/LE Compartment Syndromes, AKI  on CRRT,  07/18/15 R Forearm Fasciotomy of Flexor Compartment and R Thigh/Calf Fasciotomies, Acute toxic encephalopathy  Pain Assessment  Patient Currently in Pain: Denies  Pain Assessment: 0-10  Pain Level: 0  (Comments: 0/10 Resting pain initially, only discomfort with movement to RUE, 0/10 at end of session.)    Oxygen Therapy  SpO2: 100 %  Pulse Oximeter Device Mode: Continuous  Pulse Oximeter Device Location: Finger  O2 Device: None  (Room air)  Social/Functional History  Social/Functional History  Lives With: Family (Pt lives with his Mother. )  Type of Home: House  Home Layout: Two level, Laundry in basement, Bed/Bath upstairs  Home Access: Stairs to enter with rails (6 steps to enter. )  Bathroom Shower/Tub: Medical sales representative: Standard  Bathroom Accessibility: Accessible  ADL Assistance: Independent  Ambulation Assistance: Independent (Without assist device PTA. )  Transfer Assistance: Independent  Active Driver: No  Patient's Driver Info: Doesn't have a car.  Occupation: Full time employment  Type of occupation: Works at [employer].      Objective   Vision: Within Functional Limits  Hearing: Within functional limits       Standing Balance  Sit to stand: Unable to assess  Stand to sit: Unable to assess  ADL  Feeding: Setup;Moderate assistance;Minimal assistance  Grooming: Dependent/Total  UE Bathing: Dependent/Total  LE Bathing: Dependent/Total  UE Dressing: Dependent/Total  LE Dressing: Dependent/Total  Toileting: Dependent/Total (Foley Catheter)     Bed mobility  Rolling to Left: Dependent/Total  Rolling to Right: Dependent/Total  Supine to Sit: Unable to assess  Sit to Supine: Unable to assess  Transfers  Stand Step Transfers: Unable to assess  Sit to stand: Unable to assess  Stand to sit: Unable to assess  Transfer Comments: Not assess due to medical status     Cognition  Overall Cognitive Status: WFL     Sensation  Overall Sensation Status: WFL (for BUEs)      LUE PROM (degrees)  LUE PROM: WFL  RUE PROM (degrees)  RUE General PROM: Limited due to discomfort associated with faciotomy  Right Hand PROM (degrees)  Right  Hand PROM: WFL  Right Hand AROM (degrees)  Right Hand AROM: WFL  LUE Strength  L Shoulder Flex: 4/5  L Elbow Flex: 4+/5  L Elbow Ext: 4+/5  L Hand Grasp: 4+/5  RUE Strength  R Hand Grasp: 4-/5        Assessment   Performance deficits / Impairments: Decreased functional mobility ;Decreased ADL status;Decreased  ROM;Decreased strength;Decreased endurance;Decreased balance  Assessment: Patient with diagnoses of Heroin Overdose, Traumatic Rhabdomyolysis, R UE/LE Compartment Syndromes, AKI  on CRRT,  07/18/15 R Forearm Fasciotomy of Flexor Compartment and R Thigh/Calf Fasciotomies, Acute toxic encephalopathy demonstrating decreased functional status and is indicated for skilled OT services  Treatment Diagnosis: Weakness, Decreased ADLs and Mobility and Decreased functional use of RUE  Prognosis: Fair  Decision Making: High Complexity  History: Current Diagnoses: Heroin Overdose, Traumatic Rhabdomyolysis, R UE/LE Compartment Syndromes, AKI  on CRRT,  07/18/15 R Forearm Fasciotomy of Flexor Compartment and R Thigh/Calf Fasciotomies, Acute toxic encephalopathy   PMHx: Seizure, Aspirative Pneumonia, Delirium, Migraine, AKI, Active Smoker ~ 1 PPD  Assistance / Modification: Dependent to Max A  Patient Education: Role of OT and Explaination for Faciotomies and use of Wound Vac  Discharge Recommendations: Continue to assess pending progress (Will continue to assess for discharge planning.  Probable need for continued therapy after discharge.)  REQUIRES OT FOLLOW UP: Yes  Activity Tolerance  Activity Tolerance: Treatment limited secondary to medical complications (free text);Patient limited by pain;Patient limited by fatigue  Activity Tolerance: On Room Chiropractor Devices in place: Yes  Type of devices: Call light within reach;Bed alarm in place;Left in bed;Nurse notified  Restraints  Initially in place: No        Discharge Recommendations:  Continue to assess pending progress (Will continue to assess for discharge planning.  Probable need for continued therapy after discharge.)     Plan   Plan  Times per week: 3 to 5 times per week while in hospital  Specific instructions for Next Treatment: CoTx, NWB RUE / RLE, Wound Vac RLE, Faciotomies RUE / RLE, Strict I/O, CRRT with Right IJ VasCath  Current Treatment  Recommendations: Strengthening, ROM, Functional Mobility Training, Teaching laboratory technician, Patent attorney, Equities trader, Self-Care / ADL  Plan Comment: See last progress note for discharge status    G-Code  OT G-codes  Functional Limitation: Self care  Self Care Current Status [phone]): At least 80 percent but less than 100 percent impaired, limited or restricted  Self Care Goal Status (U0454): At least 60 percent but less than 80 percent impaired, limited or restricted    Goals  Short term goals  Time Frame for Short term goals: 2 Weeks  Short term goal 1: Mod A of 2 for basic stand pivot transfer  Short term goal 2: Patient tolerate sitting EOB 4 to 6 minutes with assist of 1-2  Short term goal 3: Patient be able to feed self 25% of meal after set up  Short term goal 4: Patient tolerate Ther Ex Program to RUE, per physician guidelines  Long term goals  Time Frame for Long term goals : TBD  Patient Goals   Patient goals : Be able to return home       Therapy Time   Individual Concurrent Group Co-treatment   Time In 1532         Time Out 1615         Minutes 43  Jeanmarie Hubert Chelsia Serres, OTR/L  Licensure: [facility] [place] [id number]

## 2015-07-21 NOTE — Progress Notes (Signed)
0800- post filter PTT sent per order, assessment done, Prisma machine managed and CRRT running without issues at this time, patient denies pain, goes back to sleep quickly  0825- critical care rounds done, see new orders, discussed moving out of ICU once CRRT stopped. No change in heparin syringe rate per order    1020-PT in to see patient  54- Dr. Lodema Hong in to see patient, orders to discontinue CRRT once this filter clots. Bicarb drip stopped per order.  1200- patient assisted w/ eating meal-     1220- Meg, NP to bedside, dressing change to right arm done  1230- Shay to bedside, right leg fasciotomies measured, began wound vac application   1340- Shay paged Dr. Yolanda Manges r/t excessive bleeding at groin fasciotomy site, MD will come to bedside to assess before going further w/ wound vac  1350- Dr. Yolanda Manges to bedside, silver alginate placed over bleeding sites, ok to proceed w/ wound vac placement despite bright red bleeding.    1346- paged Dr. Lodema Hong for high PTT, order to d/c heparin syringe in CRRT  1515- finished wound vac application, morphine and fentanyl given during procedure per order, see MAR    1632- filter clotting- blood returned and CRRT stopped per order  1650-  mother at bedside visiting, patient declines dinner    1845- Ensures ordered for patient, patient consumed one, patient resting, temp elevated, warming blanket removed    Electronically signed by Tillie Rung, RN on 07/21/2015 at 7:22 PM

## 2015-07-21 NOTE — Progress Notes (Signed)
Pulmonary Progress Note    CC: Respiratory failure; rhabdomyolysis; compartment syndrome    Subjective:  Feels sleepy. No other complaints.     ROS:  No SOB  No vomiting  No abdominal pain     Intake/Output Summary (Last 24 hours) at 07/21/15 0725  Last data filed at 07/21/15 0610   Gross per 24 hour   Intake           2903.9 ml   Output             3443 ml   Net           -539.1 ml         PHYSICAL EXAM:  Blood pressure 108/66, pulse 112, temperature 98.1 ??F (36.7 ??C), temperature source Rectal, resp. rate 19, height 6\' 4"  (1.93 m), weight 190 lb 11.2 oz (86.5 kg), SpO2 100 %.on RA    Gen: Well developed; well nourished  Eyes: No scleral icterus. No conjunctival injection.   ENT: External appearance of ears and nose normal.  Neck: Trachea midline. No obvious mass. No visible thyroid enlargement    Respiratory: Clear to auscultation bilaterally on anterior exam, no accessory muscle use  Cardiovascular: Regular rate and rhythm, no appreciable murmurs. No edema.   Skin: Warm and dry. RUE and RLL wrapped in kerlix. Normal texture and turgor  Musculoskeletal: No cyanosis, clubbing or joint deformity.    Psychiatric: Normal mood and affect; sleepy, but responds appropriately to questions     Medications:    Scheduled Meds:  ??? heparin (porcine)       ??? sodium chloride flush  10 mL Intravenous 2 times per day   ??? docusate sodium  100 mg Oral BID   ??? levETIRAcetam  500 mg Oral BID   ??? ampicillin-sulbactam  1.5 g Intravenous Q6H       Continuous Infusions:  ??? sodium chloride     ??? prismaSATE BGK 4/2.5 2,500 mL/hr (07/21/15 1610)   ??? prismaSATE BGK 4/2.5 1,500 mL/hr (07/21/15 0403)   ??? prismaSATE BGK 4/2.5 1,000 mL/hr (07/21/15 0612)   ??? heparin 5000 units CRRT syringe 4.4 mL/hr at 07/21/15 0517   ??? heparin 5000 units in 0.9% sodium chloride 1000 mL 25 mL/hr (07/21/15 0518)   ??? sodium bicarbonate infusion 100 mL/hr at 07/20/15 2311       PRN Meds:  fentanNYL, sodium chloride flush, acetaminophen, oxyCODONE-acetaminophen  **OR** oxyCODONE-acetaminophen, ondansetron, potassium chloride, magnesium sulfate, calcium gluconate IVPB **OR** calcium gluconate IVPB **OR** calcium gluconate IVPB **OR** calcium gluconate IVPB, sodium phosphate IVPB **OR** sodium phosphate IVPB **OR** sodium phosphate IVPB **OR** sodium phosphate IVPB    Labs:  CBC:   Recent Labs      07/19/15   0455  07/20/15   0357  07/21/15   0414   WBC  17.4*  14.0*  13.3*   HGB  17.4  12.0*  9.2*   HCT  52.6*  37.0*  26.8*   MCV  89.7  90.0  87.9   PLT  205  125*  111*     BMP:   Recent Labs      07/20/15   0019   07/20/15   1222  07/20/15   1725  07/21/15   0055  07/21/15   0414   NA  139   < >  140  138  136  135*   K  5.1   < >  4.6  4.5  4.8  4.7   CL  101   < >  99  97*  100  98*   CO2  23   < >  PHOS  5.0*   --   2.4*   --   3.2   --    BUN  23*   < >  CREATININE  3.7*   < >  1.8*  1.9*  2.8*  2.7*    < > = values in this interval not displayed.     LIVER PROFILE:   Recent Labs      07/19/15   0455   07/20/15   0357   07/20/15   1725  07/21/15   0055  07/21/15   0414   AST  1525*   < >  930*   < >  1188*  1052*  1005*   ALT  327*   < >  181*   < >  296*  277*  270*   BILIDIR  <0.2   --   <0.2   --    --    --   <0.2   BILITOT  <0.2   < >  0.3   < >  0.4  <0.2  0.3   ALKPHOS  89   < >  64   < >  66  52  52    < > = values in this interval not displayed.     PT/INR: No results for input(s): PROTIME, INR in the last 72 hours.  APTT:   Recent Labs      07/20/15   2213  07/21/15   0055  07/21/15   0414   APTT  87.3*  69.2*  77.3*     UA:  Recent Labs      07/18/15   1430   COLORU  DK YELLOW   PHUR  6.0   6.0   WBCUA  0   RBCUA  2   CLARITYU  Clear   SPECGRAV  1.020   LEUKOCYTESUR  Negative   UROBILINOGEN  0.2   BILIRUBINUR  Negative   BLOODU  LARGE*   GLUCOSEU  Negative     No results for input(s): PH, PCO2, PO2 in the last 72 hours.    Cultures:  Blood (2/24): NGTD  Nasal MRSA: Negative    Chest imaging:  Chest imaging was reviewed by me  and showed:  No new images    Assessment:   ?? Rhabdomyolysis  ?? Compartment Syndrome  ?? Acute kidney injury  ?? Seizure disorder  ?? Drug Overdose    Plan:    Rhabdomyolysis  - Initial CK level of >300K  - Daily CK levels    Compartment syndrome  - S/p fasciotomy to the RLE and RUE on 2/24  - Per orthopedic and vascular surgery  - On unasyn (day #3). Will need to discuss whether this may be discontinued with surgical teams    Acute kidney injury  - On CRRT per nephrology. Remains anuric  - On bicarbonate drip    Seizure disorder  - On keppra      Prophylaxis  DVT- heparin drip    Patient is at high risk given the presence of rhabdomyolysis leading to renal failure necessitating dialysis.    Carley Hammed, MD  [facility] Pulmonary, Critical Care and Sleep

## 2015-07-21 NOTE — Progress Notes (Signed)
[facility]   Progress Note      S/P :  SUBJECTIVE  Pt seen at bedside in ICU at 12 noon today. Alert and oriented. Right forearm pain is minimal at rest. "I have more pain in my leg". Pain is   described in right forearm as tight and tender and with the intensity of moderate. Pain is described as burning, pressure.       OBJECTIVE              Physical                      VITALS:    Visit Vitals   ??? BP (!) 118/54   ??? Pulse 113   ??? Temp 97.9 ??F (36.6 ??C) (Rectal)   ??? Resp 16   ??? Ht 6\' 4"  (1.93 m)   ??? Wt 190 lb 11.2 oz (86.5 kg)   ??? SpO2 100%   ??? BMI 23.21 kg/m2                       MUSCULOSKELETAL:  right hand NVI. Wiggles fingers to command. Radial pulses are palpable. FROM right hand with moderate swelling.                    NEUROLOGIC:                                  Sensory:  Touch:  Right Upper Extremity:  normal                                  Right forearm wound clean and dry with staples and laced closure. Wet to dry dressing applied                   Data       CBC:   Lab Results   Component Value Date    WBC 13.3 07/21/2015    RBC 3.05 07/21/2015    HGB 9.2 07/21/2015    HCT 26.8 07/21/2015    MCV 87.9 07/21/2015    MCH 30.0 07/21/2015    MCHC 34.1 07/21/2015    RDW 13.3 07/21/2015    PLT 111 07/21/2015    MPV 8.9 07/21/2015        WBC:    Lab Results   Component Value Date    WBC 13.3 07/21/2015        Hemoglobin/Hematocrit:    Lab Results   Component Value Date    HGB 9.2 07/21/2015    HCT 26.8 07/21/2015        PT/INR:    Lab Results   Component Value Date    PROTIME 11.7 03/29/2014    INR 1.03 03/29/2014              Current Inpatient Medications             Current Facility-Administered Medications: sodium chloride 0.9 % infusion, , ,   lactobacillus (CULTURELLE) capsule 1 capsule, 1 capsule, Oral, BID WC  silver nitrate applicators applicator, , Topical, Once  fentaNYL (SUBLIMAZE) injection 50 mcg, 50 mcg, Intravenous, Q4H PRN  sodium chloride flush 0.9 % injection 10 mL, 10 mL,  Intravenous, 2 times per day  sodium chloride flush 0.9 % injection 10 mL, 10 mL, Intravenous, PRN  acetaminophen (TYLENOL) tablet 650 mg, 650 mg, Oral, Q4H PRN  oxyCODONE-acetaminophen (PERCOCET) 5-325 MG per tablet 1 tablet, 1 tablet, Oral, Q4H PRN **OR** oxyCODONE-acetaminophen (PERCOCET) 5-325 MG per tablet 2 tablet, 2 tablet, Oral, Q4H PRN  docusate sodium (COLACE) capsule 100 mg, 100 mg, Oral, BID  ondansetron (ZOFRAN) injection 4 mg, 4 mg, Intravenous, Q6H PRN  prismaSATE BGK 4/2.5 dialysis solution, , Dialysis, Continuous  prismaSATE BGK 4/2.5 dialysis solution, , Dialysis, Continuous  prismaSATE BGK 4/2.5 dialysis solution, , Dialysis, Continuous  potassium chloride 20 mEq/50 mL IVPB (Central Line), 20 mEq, Intravenous, PRN  magnesium sulfate 1 g in dextrose 5% 100 mL IVPB premix, 1 g, Intravenous, PRN  calcium gluconate 1 g in dextrose 5 % 100 mL IVPB, 1 g, Intravenous, PRN **OR** calcium gluconate 2 g in dextrose 5 % 100 mL IVPB, 2 g, Intravenous, PRN **OR** calcium gluconate 3 g in dextrose 5 % 100 mL IVPB, 3 g, Intravenous, PRN **OR** calcium gluconate 4 g in dextrose 5 % 100 mL IVPB, 4 g, Intravenous, PRN  sodium phosphate 6 mmol in dextrose 5 % 250 mL IVPB, 6 mmol, Intravenous, PRN **OR** sodium phosphate 12 mmol in dextrose 5 % 250 mL IVPB, 12 mmol, Intravenous, PRN **OR** sodium phosphate 18 mmol in dextrose 5 % 500 mL IVPB, 18 mmol, Intravenous, PRN **OR** sodium phosphate 24 mmol in dextrose 5 % 500 mL IVPB, 24 mmol, Intravenous, PRN  heparin (porcine) 5,000 Units in sodium chloride 0.9 % 20 mL infusion, , Intravenous, Continuous  heparin (porcine) 5,000 Units in sodium chloride 0.9 % 1,000 mL infusion, 25 mL/hr, Dialysis, Continuous  levETIRAcetam (KEPPRA) tablet 500 mg, 500 mg, Oral, BID  ampicillin-sulbactam (UNASYN) 1.5 g IVPB minibag, 1.5 g, Intravenous, Q6H    ASSESSMENT AND PLAN    Seizures  Drug abuse  Rhabdomyolysis  Right arm compartment syndrome, fasciotomy per Dr Su Hoff, stable Dressing  changed by me today  Right leg fasciotomy per Dr Ramond Dial, management with wound team.     Roy Weaver  07/21/2015  1:46 PM

## 2015-07-21 NOTE — Progress Notes (Signed)
Hospitalist ICU Progress Note    CC: Non-traumatic rhabdomyolysis    Hosp course:  29yo AAM with hx of siezures and drug abuse.  Found down at home, likely overdose of heroin - was down for about 12 hours, then had severe rhabdo and severe compartment syndrome of R side - has fasciotomies of R leg and R arm.  Pt CPK was initially 270K.  Also had shock liver.  CRRT started on 2/25.  Has R IJ vascath.  Will transition to HD today - not making any urine at present.  Has 3 incisions on legs.  CPK down to 52K today.    Admit date: 07/18/2015  Days in hospital:  3    24 Hour Events: still on CRRT    Subjective: has bicarb drip on at present - VS stable at present time - he is eating - no BM since admission - started on colace - he is on unasyn currently - no seizure activity - he is on keppra    ROS:  A comprehensive review of systems was negative except for: sleepy, but denies pain currently   Objective:    Visit Vitals   ??? BP 117/66   ??? Pulse 101   ??? Temp 97.3 ??F (36.3 ??C) (Rectal)   ??? Resp 10   ??? Ht 6\' 4"  (1.93 m)   ??? Wt 190 lb 11.2 oz (86.5 kg)   ??? SpO2 100%   ??? BMI 23.21 kg/m2       Gen: ill appearing  HEENT: NC/AT, moist mucous membranes, no oropharyngeal erythema or exudate    Neck: supple, trachea midline, no anterior cervical or SC LAD  Heart:  Normal s1/s2, RRR, no murmurs, gallops, or rubs. R leg edema  Lungs:  diminished bilaterally, no wheeze, no rales, no rhonchi, no crackles, no use of accessory muscles  Abd: bowel sounds present, soft, nontender, nondistended, no masses  Extrem:  No clubbing, cyanosis,  R leg edema, peripheral pulses 2+, brisk capillary refill  Skin: no rashes or lesions, normal color/perfusion  Psych:  A & O x3    Neuro: grossly intact, moves all four extremities.       Radiology:  CXR:    FINDINGS:  Right IJ Vas-Cath has been exchanged. ??The tip of the catheter overlies the  cavoatrial junction. ??There is no pneumothorax, failure or effusion.       ?? Impression: ??   ?? Line exchange without complicating feature.         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  Non-traumatic rhabdo - has fasciotomies - will need likely wound vacs and   2.  Acute renal failure - still anuric  3.  IV drug abuse - hardly using any pain medication except for dressing changes  4.  Seizures - continue with keppra  5.  Acute blood loss anemia - from surgical sites - no need for transfusion - continue to monitor  6.  Shock liver - improving  Prognosis:  Fair    Code status:  FULL  DVT prophylaxis: []  Lovenox  [x]  IV Heparin  []  SCDs because  []  warfarin/oral direct thrombin inhibitor []  Encourage ambulation    GI prophylaxis: []  PPI/H2blocker  [x]  not indicated    Disposition  Continue ICU    Medications:  Scheduled Meds:  ??? sodium chloride flush  10 mL Intravenous 2 times per day   ??? docusate sodium  100 mg Oral BID   ??? levETIRAcetam  500 mg Oral BID   ??? ampicillin-sulbactam  1.5 g Intravenous Q6H       PRN Meds:  fentanNYL, sodium chloride flush, acetaminophen, oxyCODONE-acetaminophen **OR** oxyCODONE-acetaminophen, ondansetron, potassium chloride, magnesium sulfate, calcium gluconate IVPB **OR** calcium gluconate IVPB **OR** calcium gluconate IVPB **OR** calcium gluconate IVPB, sodium phosphate IVPB **OR** sodium phosphate IVPB **OR** sodium phosphate IVPB **OR** sodium phosphate IVPB    IV:  ??? sodium chloride     ??? prismaSATE BGK 4/2.5 2,500 mL/hr (07/21/15 1191)   ??? prismaSATE BGK 4/2.5 1,500 mL/hr (07/21/15 0759)   ??? prismaSATE BGK 4/2.5 1,000 mL/hr  (07/21/15 0612)   ??? heparin 5000 units CRRT syringe 4.4 mL/hr at 07/21/15 0517   ??? heparin 5000 units in 0.9% sodium chloride 1000 mL 25 mL/hr (07/21/15 0518)   ??? sodium bicarbonate infusion 100 mL/hr at 07/20/15 2311         Intake/Output Summary (Last 24 hours) at 07/21/15 0830  Last data filed at 07/21/15 0700   Gross per 24 hour   Intake             4138 ml   Output             3460 ml   Net              678 ml       Results:  CBC:   Recent Labs      07/19/15   0455  07/20/15   0357  07/21/15   0414   WBC  17.4*  14.0*  13.3*   HGB  17.4  12.0*  9.2*   HCT  52.6*  37.0*  26.8*   MCV  89.7  90.0  87.9   PLT  205  125*  111*     BMP:   Recent Labs      07/20/15   0019   07/20/15   1222  07/20/15   1725  07/21/15   0055  07/21/15   0414   NA  139   < >  140  138  136  135*   K  5.1   < >  4.6  4.5  4.8  4.7   CL  101   < >  99  97*  100  98*   CO2  23   < >  25  24  25  25    PHOS  5.0*   --   2.4*   --   3.2   --    BUN  23*   < >  12  12  20  19    CREATININE  3.7*   < >  1.8*  1.9*  2.8*  2.7*    < > = values in this interval not displayed.     Mag: No results for input(s): MAG in the last 72 hours.  LIVER PROFILE:   Recent Labs      07/19/15   0455   07/20/15   0357  07/20/15   1725  07/21/15   0055  07/21/15   0414   AST  1525*   < >  930*   < >  1188*  1052*  1005*   ALT  327*   < >  181*   < >  296*  277*  270*   BILIDIR  <0.2   --   <0.2   --    --    --   <0.2   BILITOT  <0.2   < >  0.3   < >  0.4  <0.2  0.3   ALKPHOS  89   < >  64   < >  66  52  52    < > = values in this interval not displayed.     PT/INR: No results for input(s): PROTIME, INR in the last 72 hours.  APTT:   Recent Labs      07/20/15   2213  07/21/15   0055  07/21/15   0414   APTT  87.3*  69.2*  77.3*     UA:  Recent Labs      07/18/15   1430   COLORU  DK YELLOW   PHUR  6.0   6.0   WBCUA  0   RBCUA  2   CLARITYU  Clear   SPECGRAV  1.020   LEUKOCYTESUR  Negative   UROBILINOGEN  0.2   BILIRUBINUR  Negative   BLOODU  LARGE*   GLUCOSEU   Negative       ABG:   Lab Results   Component Value Date    PHART 7.339 07/19/2015    PCO2ART 38.3 07/19/2015    PO2ART 238.0 07/19/2015    HCO3ART 20.1 07/19/2015    BEART -4.8 07/19/2015    TCO2ART 21.2 07/19/2015    O2SATART 99.6 07/19/2015       Lab Results   Component Value Date    CALCIUM 7.2 (L) 07/21/2015    PHOS 3.2 07/21/2015             Electronically signed by Markus Jarvis, MD on 07/21/2015 at 8:30 AM

## 2015-07-21 NOTE — Progress Notes (Signed)
Physical Therapy    Facility/Department: [facility]  Initial Assessment    NAME: Roy Weaver  DOB: [birth year]  MRN: [id number]    Date of Service: 07/21/2015    Patient Diagnosis(es):   Patient Active Problem List    Diagnosis Date Noted   ??? Acute respiratory failure with hypoxia (HCC) 07/19/2015   ??? Non-traumatic rhabdomyolysis    ??? AKI (acute kidney injury) (HCC)    ??? 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 Post-ictal state (HCC) 07/12/2015   ??? Breakthrough seizure (HCC)    ??? Visit for suture removal 03/25/2015   ??? Seizure (HCC) 04/10/2014   ??? Aspiration pneumonia of right lung (HCC) 03/30/2014   ??? 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 Seizures (HCC) 02/14/2014   ??? Partial symptomatic epilepsy with complex partial seizures, intractable, without status epilepticus (HCC)    ??? Noncompliance with medication regimen    ??? Partial epilepsy with impairment of consciousness (HCC) 11/07/2013   ??? Delirium    ??? Drug use    ??? Recurrent seizures (HCC) 09/14/2013   ??? History of nonadherence to medical treatment 09/14/2013   ??? Migraine 05/03/2013   ??? Seizures (HCC) 04/09/2013       Past Medical History   Diagnosis Date   ??? AKI (acute kidney injury) (HCC)    ??? Migraine    ??? Psychiatric problem    ??? Seizures (HCC)    ??? Traumatic rhabdomyolysis (HCC)      History reviewed. No pertinent past surgical history.      Restrictions  Restrictions/Precautions  Restrictions/Precautions: Fall Risk, Weight Bearing  Lower Extremity Weight Bearing Restrictions  Right Lower Extremity Weight Bearing: Non Weight Bearing  Upper Extremity Weight Bearing Restrictions  Right Upper Extremity Weight Bearing: Non Weight Bearing  Position Activity Restriction  Other position/activity restrictions: CRRT - R IJ Vascath (Clavicular Region).   Fasciotomy R Forearm, R Thigh, and R Calf regions.  Heroin Abuse.     Vision/Hearing  Vision: Within Functional Limits  Hearing: Within functional limits     Subjective  General  Chart Reviewed: Yes  Patient assessed for rehabilitation services?: Yes  Additional Pertinent Hx: 30 y/o male admit 07/18/15 with Heroin Overdose, Traumatic Rhabdo (down at least 12 hr, found bathroom floor), R UE/LE Compartment Syndromes, AKI (currently on CRRT).    07/18/15 S/P R Forearm Fasciotomy of Flexor Compartment and R Thigh/Calf Fasciotomies.  PMH as noted including Heroin Abuse.   Family / Caregiver Present: No  Referring Practitioner: Dr. Su Hoff.  Diagnosis: Heroin Overdose, Traumatic Rhabdo, AKI, R UE/LE Compartment Syndromes S/P Fasciotomies.   Follows Commands: Within Functional Limits  Subjective  Subjective: Pt agreeable to PT Eval/Rx.   Pain Screening  Patient Currently in Pain: Denies  Vital Signs  Patient Currently in Pain: Denies  Pre Treatment Pain Screening  Comments / Details: Pt reports pain R UE/LE with any Mvt/ROM.     Orientation  Orientation  Overall Orientation Status: Within Functional Limits  Social/Functional History  Social/Functional History  Lives With: Family (Pt lives with his Mother. )  Type of Home: House  Home Layout: Two level, Laundry in basement, Bed/Bath upstairs  Home Access: Stairs to enter with rails (6 steps to enter. )  Bathroom Shower/Tub: Medical sales representative: Standard  Bathroom Accessibility: Accessible  ADL Assistance: Independent  Ambulation Assistance: Independent (Without assist  device PTA. )  Transfer Assistance: Independent  Active Driver: No  Patient's Driver Info: Doesn't have a car.  Type of occupation: Works at [employer].   Objective     AROM RLE (degrees)  RLE General AROM: Initiated very slight/min Hip/Knee Flex/Ext.  Ankle DF/PF WFLs.    AROM LLE (degrees)  LLE AROM : WFL  AROM LUE (degrees)  LUE AROM : WFL  Strength RLE  Comment: Palpable contraction Glut, Quad and Hamstring.  Unable to assess any further at this time.   Strength  LLE  Strength LLE: WFL  Strength LUE  Strength LUE: WFL        Bed Mobility  Rolling: Dependent/Total  Supine to Sit: Unable to assess  Scooting: Dependent/Total  Transfers  Bed to Chair: Unable to assess           Exercises  Comments: Initiated AROM Exs L LE.  Isometrics R LE, Ankle Pumps.      Assessment   Body structures, Functions, Activity limitations: Decreased functional mobility ;Decreased ROM;Decreased strength;Decreased endurance  Assessment: 30 y/o male admit 07/18/15 with Heroin Overdose, Traumatic Rhabdo (down at least 12 hr), AKI and R UE/LE Compartment Syndromes.  S/P R UE/LE Fasciotomies 07/18/15, currently undergoing CRRT for AKI.  PTA pt living with parent, 2 story house with steps to enter/2nd floor bed/bath.  Unclear d/c needs at this time, will progress with functional activities as approp.  Probable need SNF vs In-Pt Rehab, will monitor hospital course/pt's progress.    Prognosis: Fair  Decision Making: High Complexity  History: 30 y/o male admit 07/18/15 with Heroin Overdose, Traumatic Rhabdo (down at least 12 hr), AKI and R UE/LE Compartment Syndromes.  S/P R UE/LE Fasciotomies 07/18/15, currently undergoing CRRT for AKI.   Exam: CRRT ongoing, dressings intact R Forearm, R Thigh/Calf.  ROM/Strength appear WFLs L UE/LE.  Unable to fully assess R UE/LE at this time.  Clinical Presentation: Bed Mob dependent at this time.  Unable to attempt any EOB activities.    Patient Education: Role of PT, POC  Barriers to Learning: None  REQUIRES PT FOLLOW UP: Yes  Activity Tolerance  Activity Tolerance: Treatment limited secondary to medical complications (free text)  Activity Tolerance: Limited due to CRRT, Recent Fasciotomies R UE/LE.   PT Equipment Recommendations  Other: Will monitor for potential equipt needs.      Discharge Recommendations:  Continue to assess pending progress      Plan   Plan  Times per week: 3-5x week as approp while in acute care setting.   Current Treatment Recommendations:  Strengthening, ROM, Building services engineer, Teacher, early years/pre, Patent attorney, Patient/Caregiver Education & Training  Safety Devices  Type of devices: Bed alarm in place, Call light within reach, Left in bed, Nurse notified    G-Code  PT G-Codes  Functional Limitation: Mobility: Walking and moving around  Mobility: Walking and Moving Around Current Status [phone]): At least 80 percent but less than 100 percent impaired, limited or restricted  Mobility: Walking and Moving Around Goal Status [phone]): At least 40 percent but less than 60 percent impaired, limited or restricted    Goals  Short term goals  Time Frame for Short term goals: Upon d/c acute care setting.   Short term goal 1: Pt participating in approp ROM/Strength Exs.   Short term goal 2: Clarify Activity Orders (Wgt bear/ROM restrictions) and establish Functional Goals as approp.   Patient Goals   Patient goals : Return home.  Therapy Time   Individual Concurrent Group Co-treatment   Time In 1000         Time Out 1045         Minutes 45                 Dorena BodoSuzanne L Sandi Towe, PT

## 2015-07-21 NOTE — Progress Notes (Signed)
VASCULAR    Extubated. Tolerating CRRT.    VSS afeb  Palpable R DP pulse  Thigh/calf soft  4/5 dorsiflexion R foot  Muscles viable    A/P: S/P R thigh and calf fasciotomies   VAC today with M-W-F changes.     Dolores Hoose

## 2015-07-21 NOTE — Progress Notes (Signed)
Nutrition Assessment    Type and Reason for Visit: Initial (ICU admit, multiple surgical incisions )    Nutrition Recommendations:   Continue renal diet per MD  Increase ensure enlive to TID  Monitor renal labs/HD status, need to change supplement to Nepro  Monitor po intake, supplement intake  Monitor wt, labs, wound healing, bm.     Malnutrition Assessment:  ?? Malnutrition Status: Insufficient data  ?? Context: Acute illness or injury  ?? Findings of the 6 clinical characteristics of malnutrition (Minimum of 2 out of 6 clinical characteristics is required to make the diagnosis of moderate or severe Protein Calorie Malnutrition based on AND/ASPEN Guidelines):  1. Energy Intake-Not available, not able to assess    2. Weight Loss-Unable to assess,    3. Fat Loss-Unable to assess,    4. Muscle Loss-Unable to assess,    5. Fluid Accumulation-Unable to assess,    6. Grip Strength-Not measured    Nutrition Diagnosis:   ?? Problem: Increased nutrient needs  ?? Etiology: related to Increased demand for energy/nutrients due to    ??? Signs and symptoms:  as evidenced by Presence of wounds, Known losses from dialysis    Nutrition Assessment:  ?? Subjective Assessment: Pt with dx renal failure. Pt admitted s/p drug overdose, pt found to have compartment syndrome RLE and R forearms, s/p fasciotomy on 2/24. Pt was extubated on 2/25. Pt started on CRRT on 2/25, plans to transition to HD today per rounds. Pt is eating well per RN. Per flowsheet, only 1 meal recorded on 2/25 1-25%. Pt with consult for wound care to be evaluated for wound vac per RN. Pt unavailable for interview at time of visits. Observed pt ate only bites of breakfast food but drank 100% ensure.   ?? Nutrition-Focused Physical Findings: no bm recorded since admit. + 5.4 liters fluid.   ?? Wound Type: Multiple, Surgical Wound (3 SI R legs, 1 SI R Forearm)  ?? Current Nutrition Therapies:  ?? Oral Diet Orders: Renal   ?? Oral Diet intake: Unable to assess  ?? Oral Nutrition  Supplement (ONS) Orders: Standard High Calorie Oral Supplement  ?? ONS intake: 76-100% (x 1)  ?? Anthropometric Measures:  ?? Ht:  (193 cm)   ?? Current Body Wt: 190 lb (86.2 kg)  ?? Admission Body Wt: 176 lb (79.8 kg):  ,     ?? Ideal Body Wt: 202 lb (91.6 kg), % Ideal Body    ?? BMI Classification: BMI 18.5 - 24.9 Normal Weight  ?? Comparative Standards (Estimated Nutrition Needs):  ?? Estimated Daily Total Kcal: 2150-2580  ?? Estimated Daily Protein (g): 103--129 (on HD and surgical wounds)    Estimated Intake vs Estimated Needs: Intake Less Than Needs    Nutrition Risk Level: High    Nutrition Interventions:   Continue current diet, Modify current ONS (increase ensure enlive to TID, monitor renal labs/HD, need to change to Nepro )  Continued Inpatient Monitoring, Education not appropriate at this time    Nutrition Evaluation:   ?? Evaluation: Goals set   ?? Goals: Pt will consume > 50% of meals and ONS    ?? Monitoring: Meal Intake, Supplement Intake, Wound Healing, Weight, Pertinent Labs, Constipation    See Adult Nutrition Doc Flowsheet for more detail.     Electronically signed by Joslyn Devon, RD, LD on 07/21/15 at 1:54 PM    Contact Number: [phone]

## 2015-07-21 NOTE — Care Coordination-Inpatient (Signed)
[facility] Continence Nurse  Consult Note       NAME:  Roy Weaver  MEDICAL RECORD NUMBER:  [id number]  AGE: 30 y.o.   GENDER: male  DOB: 26-Sep-1985  TODAY'S DATE:  07/21/2015    Subjective   Reason for WOCN Evaluation and Assessment:  Placement Wound VAC's R medial and lateral thighs, R lateral lower leg      Roy Weaver is a 30 y.o. male referred by:   [x]  Physician  []  Nursing  []  Other:     Wound Identification:  Wound Type: non-healing surgical  Contributing Factors: edema, chronic pressure, decreased mobility, shear force, malnutrition and non-adherence    Wound History: s/p POD #2 decompression fasciotomy right forearm and RLE compartment syndrome. Pt awake on CRRT with Heparin.  Pt has a history of polysubstance abuse. Pt presented with extremely swollen right thigh and forearm. Right arm surgical wound assessed with Orthopedic NP Meg. Moist dressing applied. Right leg with 3 large surgical wounds with exposed muscle, tendon, and ligament. Bleeding noted from right lateral and medial surgical wounds. Dr. Yolanda Manges notified. Surgical leg wounds assessed at bedside by Dr. Yolanda Manges. Aquacel Ag dressing applied to bleeder from right medial leg wound. NWPT placement difficult taking the WOC nurse 1 hour and 30 minutes. See wound detail, measurements, photo, and plan of care.     Patient Goal of Care:  [x]  Wound Healing  []  Odor Control  []  Palliative Care  [x]  Pain Control   []  Other:         PAST MEDICAL HISTORY        Diagnosis Date   ??? AKI (acute kidney injury) (HCC)    ??? Migraine    ??? Psychiatric problem    ??? Seizures (HCC)    ??? Traumatic rhabdomyolysis (HCC)        PAST SURGICAL HISTORY    History reviewed. No pertinent past surgical history.    FAMILY HISTORY    Family History   Problem Relation Age of Onset   ??? Diabetes Father        SOCIAL HISTORY    Social History   Substance Use Topics   ??? Smoking status: Current Some Day Smoker     Packs/day: 1.00     Years: 2.00     Types: Cigarettes   ???  Smokeless tobacco: Former Neurosurgeon     Quit date: 10/04/2013   ??? Alcohol use 0.0 oz/week     0 Standard drinks or equivalent per week      Comment: occasioanly       ALLERGIES    No Known Allergies    MEDICATIONS    No current facility-administered medications on file prior to encounter.      Current Outpatient Prescriptions on File Prior to Encounter   Medication Sig Dispense Refill   ??? levETIRAcetam (KEPPRA) 1000 MG tablet Take 1 tablet by mouth 2 times daily 60 tablet 6       Objective      Visit Vitals   ??? BP (!) 118/54   ??? Pulse 113   ??? Temp 97.9 ??F (36.6 ??C) (Rectal)   ??? Resp 16   ??? Ht 6\' 4"  (1.93 m)   ??? Wt 190 lb 11.2 oz (86.5 kg)   ??? SpO2 100%   ??? BMI 23.21 kg/m2       LABS:  WBC:    Lab Results   Component Value Date    WBC 13.3 07/21/2015  H/H:    Lab Results   Component Value Date    HGB 9.2 07/21/2015    HCT 26.8 07/21/2015     PTT:    Lab Results   Component Value Date    APTT 137.5 07/21/2015   [APTT}  PT/INR:    Lab Results   Component Value Date    PROTIME 11.7 03/29/2014    INR 1.03 03/29/2014     HgBA1c:  No results found for: LABA1C    Assessment   Braden Risk Score: Braden Scale Score: 14 at risk     Patient Active Problem List   Diagnosis Code   ??? Seizures (HCC) R56.9   ??? Migraine G43.909   ??? Recurrent seizures (HCC) G40.909   ??? History of nonadherence to medical treatment Z91.19   ??? Delirium R41.0   ??? Drug use F19.90   ??? Partial epilepsy with impairment of consciousness (HCC) G40.209   ??? Seizures (HCC) R56.9   ??? Partial symptomatic epilepsy with complex partial seizures, intractable, without status epilepticus (HCC) G40.219   ??? Noncompliance with medication regimen Z91.14   ??? Aspiration pneumonia of right lung (HCC) J69.0   ??? 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 Seizure (HCC) R56.9   ??? Visit for suture removal Z48.02   ??? Post-ictal state (HCC) R56.9   ??? Breakthrough seizure (HCC) G40.919   ??? Non-traumatic rhabdomyolysis M62.82   ??? AKI (acute  kidney injury) (HCC) N17.9   ??? 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 Non-traumatic compartment syndrome of right upper extremity M79.A11   ??? Acute respiratory failure with hypoxia (HCC) J96.01       Measurements:  Incision 07/18/15 Leg Right;Lateral (Active)   Assessment Bleeding;Drainage;Fragile;Painful;Red;Pink;White;Swelling 07/21/2015  1:42 PM   Peri-wound Assessment Dry;Intact 07/21/2015  1:42 PM   Wound Length (cm) 46 cm 07/21/2015  1:42 PM   Wound Width (cm) 14.3 cm 07/21/2015  1:42 PM   Depth (cm)  3 07/21/2015  1:42 PM   Drainage Amount Copious 07/21/2015  1:42 PM   Drainage Description Serosanguinous;Sanguinous 07/21/2015  1:42 PM   Odor None 07/21/2015  1:42 PM   Dressing/Treatment Moist to dry;ABD 07/20/2015  4:22 PM   Dressing Changed Dressing reinforced 07/21/2015  4:00 AM   Dressing Status New drainage;Old drainage;Intact 07/21/2015  8:00 AM   Dressing Change Due 07/21/15 07/20/2015  4:22 PM   Number of days:2      07/21/15 Right lateral thigh surgical wound   Exposed muscle, tendon, and ligaments      Incision 07/18/15 Arm Right;Lower (Active)   Assessment UTA 07/21/2015  8:00 AM   Peri-wound Assessment Pink;Clean;Dry 07/20/2015  2:20 PM   Closure Staples 07/21/2015  4:00 AM   Drainage Amount Scant 07/20/2015  4:02 AM   Drainage Description Serosanguinous 07/21/2015  4:00 AM   Odor None 07/21/2015  4:00 AM   Dressing/Treatment Xeroform;Dry dressing;Other (Comment) 07/20/2015  2:20 PM   Dressing Changed Changed/New 07/20/2015  2:20 PM   Dressing Status Clean;Dry;Intact 07/21/2015  8:00 AM   Dressing Change Due 07/22/15 07/20/2015  4:22 PM   Number of days:2           Incision 07/21/15 Leg Right;Lower (Active)   Assessment Drainage;Fragile;Painful;Red;White 07/21/2015  1:42 PM   Wound Length (cm) 22 cm 07/21/2015  1:42 PM   Wound Width (cm) 6.5 cm 07/21/2015  1:42 PM   Depth (cm)  0.9 07/21/2015  1:42 PM  Drainage Amount Moderate 07/21/2015  1:42 PM   Drainage Description Serosanguinous  07/21/2015  1:42 PM   Odor None 07/21/2015  1:42 PM   Number of days:0      07/21/15 Left Calf muscle and subcutaneous fat      Incision 07/21/15 Leg Right;Medial (Active)   Assessment Bleeding;Fragile;Red;Swelling 07/21/2015  1:42 PM   Peri-wound Assessment Dry;Intact 07/21/2015  1:42 PM   Wound Length (cm) 29.5 cm 07/21/2015  1:42 PM   Wound Width (cm) 10 cm 07/21/2015  1:42 PM   Depth (cm)  2.5 07/21/2015  1:42 PM   Drainage Amount Copious 07/21/2015  1:42 PM   Drainage Description Sanguinous 07/21/2015  1:42 PM   Odor None 07/21/2015  1:42 PM   Number of days:0       07/21/15 Right medial thigh with active sanguinous drainage    Exposed muscle     Response to treatment:  Poorly tolerated by patient., With complaints of pain.     Pain Assessment:  Severity:  10 / 10  Quality of pain: aching, shooting, tender, pressure  Wound Pain Timing/Severity: intermittent  Premedicated: Yes    Plan   Plan of Care:    1. Implement Braden prevention orders. See orders for prevention order details   2. Implement wound care orders. See wound care orders for dressing change instructions   Right lateral, medial, and calf: NWPT    Right arm: moist to moist dressing   3. Re-consulted if further assistance needed.   4. Will change NWPT dressing on Wednesday     Specialty Bed Required : Yes   [x]  Low Air Loss: Bedside nurse to order Stryker air II mattress    [x]  Pressure Redistribution: ICU   []  Fluid Immersion  []  Bariatric  []  Total Pressure Relief  []  Other:     Current Diet: Dietary Nutrition Supplements: Standard High Calorie Oral Supplement  DIET RENAL;  Dietician consult:  Yes    Patient/Caregiver Teaching:  Level of patient/caregiver understanding able to: Spoke with bedside nurse Cicero Duck and patient about plan of care   []  Indicates understanding       [x]  Needs reinforcement  []  Unsuccessful      []  Verbal Understanding  []  Demonstrated understanding       []  No evidence of learning  []  Refused teaching         []  N/A       Electronically  signed by Rebbeca Paul, RN, CWOCN on 07/21/2015 at 1:46 PM

## 2015-07-21 NOTE — Care Coordination-Inpatient (Signed)
CASE MANAGEMENT: CHART REVIEWED.  Complicated case: unsure of needs for discharge presently.  Heroin use,transitioning to  hemodialysis, compartment syndrome.  Will follow with therapies and social services to better assess needs when course of treatment for discharge is determined.  Peggy Ayushi Pla R.N.  Case Manager  [phone]

## 2015-07-22 LAB — CBC WITH AUTO DIFFERENTIAL
Basophils %: 0.1 %
Basophils Absolute: 0 10*3/uL (ref 0.0–0.2)
Eosinophils %: 0.7 %
Eosinophils Absolute: 0.1 10*3/uL (ref 0.0–0.6)
Hematocrit: 18 % — CL (ref 40.5–52.5)
Hemoglobin: 6.2 g/dL — CL (ref 13.5–17.5)
Lymphocytes %: 14.3 %
Lymphocytes Absolute: 1.4 10*3/uL (ref 1.0–5.1)
MCH: 30.6 pg (ref 26.0–34.0)
MCHC: 34.4 g/dL (ref 31.0–36.0)
MCV: 89.1 fL (ref 80.0–100.0)
MPV: 7.8 fL (ref 5.0–10.5)
Monocytes %: 9.7 %
Monocytes Absolute: 1 10*3/uL (ref 0.0–1.3)
Neutrophils %: 75.2 %
Neutrophils Absolute: 7.6 10*3/uL (ref 1.7–7.7)
Platelets: 100 10*3/uL — ABNORMAL LOW (ref 135–450)
RBC: 2.02 M/uL — ABNORMAL LOW (ref 4.20–5.90)
RDW: 13.4 % (ref 12.4–15.4)
WBC: 10.1 10*3/uL (ref 4.0–11.0)

## 2015-07-22 LAB — BASIC METABOLIC PANEL
Anion Gap: 12 (ref 3–16)
BUN: 37 mg/dL — ABNORMAL HIGH (ref 7–20)
CO2: 26 mmol/L (ref 21–32)
Calcium: 7.6 mg/dL — ABNORMAL LOW (ref 8.3–10.6)
Chloride: 98 mmol/L — ABNORMAL LOW (ref 99–110)
Creatinine: 4.4 mg/dL — ABNORMAL HIGH (ref 0.9–1.3)
GFR African American: 19 — AB (ref >60)
GFR Non-African American: 16 — AB (ref >60)
Glucose: 101 mg/dL — ABNORMAL HIGH (ref 70–99)
Potassium: 4.5 mmol/L (ref 3.5–5.1)
Sodium: 136 mmol/L (ref 136–145)

## 2015-07-22 LAB — PREPARE RBC (CROSSMATCH)
Dispense Status Blood Bank: TRANSFUSED
Dispense Status Blood Bank: TRANSFUSED

## 2015-07-22 LAB — HEPATIC FUNCTION PANEL
ALT: 151 U/L — ABNORMAL HIGH (ref 10–40)
AST: 575 U/L — ABNORMAL HIGH (ref 15–37)
Albumin: 2.2 g/dL — ABNORMAL LOW (ref 3.4–5.0)
Alkaline Phosphatase: 50 U/L (ref 40–129)
Bilirubin, Direct: <0.2 mg/dL (ref 0.0–0.3)
Total Bilirubin: <0.2 mg/dL (ref 0.0–1.0)
Total Protein: 4.5 g/dL — ABNORMAL LOW (ref 6.4–8.2)

## 2015-07-22 LAB — HEMOGLOBIN AND HEMATOCRIT
Hematocrit: 19.4 % — CL (ref 40.5–52.5)
Hematocrit: 21.6 % — ABNORMAL LOW (ref 40.5–52.5)
Hemoglobin: 6.6 g/dL — CL (ref 13.5–17.5)
Hemoglobin: 7.5 g/dL — ABNORMAL LOW (ref 13.5–17.5)

## 2015-07-22 LAB — TYPE AND SCREEN
ABO/Rh: O POS
Antibody Screen: NEGATIVE

## 2015-07-22 LAB — MAGNESIUM: Magnesium: 2.8 mg/dL — ABNORMAL HIGH (ref 1.80–2.40)

## 2015-07-22 LAB — PHOSPHORUS: Phosphorus: 3.6 mg/dL (ref 2.5–4.9)

## 2015-07-22 LAB — CK: Total CK: 31983 U/L — ABNORMAL HIGH (ref 39–308)

## 2015-07-22 MED ORDER — SODIUM CHLORIDE 0.9 % IV SOLN
0.9 % | INTRAVENOUS | Status: AC
Start: 2015-07-22 — End: 2015-07-22
  Administered 2015-07-22: 19:00:00 250

## 2015-07-22 MED ORDER — SODIUM CHLORIDE 0.9 % IV SOLN
0.9 % | INTRAVENOUS | Status: DC
Start: 2015-07-22 — End: 2015-07-23

## 2015-07-22 MED ORDER — HEPARIN SODIUM (PORCINE) 1000 UNIT/ML IJ SOLN
1000 UNIT/ML | INTRAMUSCULAR | Status: DC
Start: 2015-07-22 — End: 2015-07-22

## 2015-07-22 MED ORDER — LEVETIRACETAM 500 MG PO TABS
500 MG | Freq: Every evening | ORAL | Status: DC
Start: 2015-07-22 — End: 2015-07-22

## 2015-07-22 MED ORDER — LEVETIRACETAM 500 MG PO TABS
500 MG | ORAL | Status: DC
Start: 2015-07-22 — End: 2015-07-29
  Administered 2015-07-23 – 2015-07-29 (×7): 500 mg via ORAL

## 2015-07-22 MED ORDER — SODIUM CHLORIDE 0.9 % IV BOLUS
0.9 % | Freq: Once | INTRAVENOUS | Status: AC
Start: 2015-07-22 — End: 2015-07-22
  Administered 2015-07-22: 13:00:00 250 mL via INTRAVENOUS

## 2015-07-22 MED ORDER — SODIUM CHLORIDE 0.9 % IV BOLUS
0.9 % | Freq: Once | INTRAVENOUS | Status: AC
Start: 2015-07-22 — End: 2015-07-23
  Administered 2015-07-22: 19:00:00 250 mL via INTRAVENOUS

## 2015-07-22 MED ORDER — SODIUM CHLORIDE 0.9 % IV SOLN
0.9 % | INTRAVENOUS | Status: DC
Start: 2015-07-22 — End: 2015-07-22

## 2015-07-22 MED ORDER — SODIUM CHLORIDE 0.9 % IV SOLN (MINI-BAG)
0.9 % | Freq: Two times a day (BID) | INTRAVENOUS | Status: DC
Start: 2015-07-22 — End: 2015-07-22
  Administered 2015-07-22: 11:00:00 1.5 g via INTRAVENOUS

## 2015-07-22 MED FILL — AMPICILLIN-SULBACTAM SODIUM 1.5 (1-0.5) G IJ SOLR: 1.5 (1-0.5) g | INTRAMUSCULAR | Qty: 1.5

## 2015-07-22 MED FILL — OXYCODONE-ACETAMINOPHEN 5-325 MG PO TABS: 5-325 MG | ORAL | Qty: 1

## 2015-07-22 MED FILL — SODIUM CHLORIDE 0.9 % IV SOLN: 0.9 % | INTRAVENOUS | Qty: 250

## 2015-07-22 MED FILL — CULTURELLE PO CAPS: ORAL | Qty: 1

## 2015-07-22 MED FILL — SODIUM CHLORIDE 0.9 % IV SOLN: 0.9 % | INTRAVENOUS | Qty: 2000

## 2015-07-22 MED FILL — LEVETIRACETAM 500 MG PO TABS: 500 MG | ORAL | Qty: 1

## 2015-07-22 MED FILL — DOK 100 MG PO CAPS: 100 MG | ORAL | Qty: 1

## 2015-07-22 MED FILL — HEPARIN SODIUM (PORCINE) 1000 UNIT/ML IJ SOLN: 1000 UNIT/ML | INTRAMUSCULAR | Qty: 10

## 2015-07-22 NOTE — Progress Notes (Signed)
[facility]   Progress Note      S/P :  SUBJECTIVE  In bed. Alert and oriented., Drinking Ensure. Pain is   described in right forearm and with the intensity of moderate. Pain is described as aching, burning, pressure.       OBJECTIVE              Physical                      VITALS:    Visit Vitals   ??? BP 137/65   ??? Pulse 98   ??? Temp 99.7 ??F (37.6 ??C) (Oral)   ??? Resp 14   ??? Ht  (1.93 m)   ??? Wt 189 lb 2.5 oz (85.8 kg)   ??? SpO2 99%   ??? BMI 23.02 kg/m2                       MUSCULOSKELETAL:  right hand NVI. Wiggles tfingers to command. Radial pulses are palpable.                   NEUROLOGIC:                                  Sensory:  Touch:  Right Upper Extremity:  normal                                                 Surgical wound appears clean and dry with staples and vessel loop as lacing of wound. WEt to dry dressing applied with Kerlix    Data       CBC:   Lab Results   Component Value Date    WBC 10.1 07/22/2015    RBC 2.02 07/22/2015    HGB 6.2 07/22/2015    HCT 18.0 07/22/2015    MCV 89.1 07/22/2015    MCH 30.6 07/22/2015    MCHC 34.4 07/22/2015    RDW 13.4 07/22/2015    PLT 100 07/22/2015    MPV 7.8 07/22/2015        WBC:    Lab Results   Component Value Date    WBC 10.1 07/22/2015        Hemoglobin/Hematocrit:    Lab Results   Component Value Date    HGB 6.2 07/22/2015    HCT 18.0 07/22/2015        PT/INR:    Lab Results   Component Value Date    PROTIME 11.7 03/29/2014    INR 1.03 03/29/2014              Current Inpatient Medications             Current Facility-Administered Medications: 0.9 % sodium chloride bolus, 250 mL, Intravenous, Once  levETIRAcetam (KEPPRA) tablet 500 mg, 500 mg, Oral, Nightly  fentaNYL (SUBLIMAZE) injection 50 mcg, 50 mcg, Intravenous, Q4H PRN  sodium chloride flush 0.9 % injection 10 mL, 10 mL, Intravenous, 2 times per day  sodium chloride flush 0.9 % injection 10 mL, 10 mL, Intravenous, PRN  acetaminophen (TYLENOL) tablet 650 mg, 650 mg, Oral, Q4H  PRN  oxyCODONE-acetaminophen (PERCOCET) 5-325 MG per tablet 1 tablet, 1 tablet, Oral, Q4H PRN **OR** oxyCODONE-acetaminophen (PERCOCET) 5-325 MG per tablet 2  tablet, 2 tablet, Oral, Q4H PRN  docusate sodium (COLACE) capsule 100 mg, 100 mg, Oral, BID  ondansetron (ZOFRAN) injection 4 mg, 4 mg, Intravenous, Q6H PRN    ASSESSMENT AND PLAN   Right forearm compartment syndrome, post fasciotomy, stable  Continue daily wet to dry dressings right arm  Encouraged ROM right hand and fingers.   Right leg wound vac care per wound nurse and vascular  Drug abuse  Rhabdomyolysis.   Roy Weaver  07/22/2015  12:46 PM

## 2015-07-22 NOTE — Progress Notes (Addendum)
4540: Patient's hemoglobin 6.2, Dr. Glori Luis called informed of hemoglobin, ordered 1unit transfusion. Patient stated he is unable to see because eyes are blurry from when he crashed to floor, he has given verbal consent to receiving blood. He stated yes to understanding the risk of receiving blood, He stated. "I give consent to receive blood."    To nurses in room when patient was explained risk and when he verbalized consent.    Hinda Kehr Electronically signed by Lynn Ito, RN on 07/22/2015 at 6:35 AM    And    Rolley Sims Electronically signed by Marguerita Beards, RN on 07/22/2015 at 7:42 AM

## 2015-07-22 NOTE — Progress Notes (Signed)
Nutrition Assessment    Type and Reason for Visit: Reassess    Nutrition Recommendations:     Continue on renal diet  D/c ensure enlive  Added Nepro BID  (provides 425 kcal / 19gm protein per shake)  Monitor intake, wt, labs, bowels, wound healing    Malnutrition Assessment:  ?? Malnutrition Status: At risk for malnutrition  ?? Context: Acute illness or injury  ?? Findings of the 6 clinical characteristics of malnutrition (Minimum of 2 out of 6 clinical characteristics is required to make the diagnosis of moderate or severe Protein Calorie Malnutrition based on AND/ASPEN Guidelines):  1. Energy Intake-Less than or equal to 75% (per flowsheet records), greater than or equal to 5 days    2. Weight Loss-No significant weight loss,    3. Fat Loss-Unable to assess,    4. Muscle Loss-Unable to assess,    5. Fluid Accumulation-Unable to assess,    6. Grip Strength-Not measured    Nutrition Diagnosis:   ?? Problem: Increased nutrient needs  ?? Etiology: related to Increased demand for energy/nutrients due to    ??? Signs and symptoms:  as evidenced by Presence of wounds, Known losses from dialysis    Nutrition Assessment:  ?? Subjective Assessment: Pt with renal failure, admitted s/p drug overdose, s/p fasiotomy on 2/24, extubated 2/25, stopped CRRT on 2/27, now planning dialysis & possible TDC placement per rounds. Continues on renal diet, per RN this morning intake is poor & pt threw away ensure because he didn't like it. D/c ensure enlive, added Nepro for pt to trial.  Wound vac in place. Labs ~ Cr 4.4. Per MD in rounds, plan for possible d/c to LTAC later this week. Will continue to monitor.  ?? Nutrition-Focused Physical Findings: No BM since admission, +5.5L of fluid since admission  ?? Wound Type: Multiple, Surgical Wound  ?? Current Nutrition Therapies:  ?? Oral Diet Orders: Renal   ?? Oral Diet intake: Unable to assess  ?? Oral Nutrition Supplement (ONS) Orders: Renal Supplement  ?? ONS intake: Unable to  assess  ?? Anthropometric Measures:  ?? Ht:  (193 cm)   ?? Current Body Wt: 189 lb (85.7 kg)  ?? Admission Body Wt: 176 lb (79.8 kg)  ?? Ideal Body Wt: 202 lb (91.6 kg),   ?? BMI Classification: BMI 18.5 - 24.9 Normal Weight  ?? Comparative Standards (Estimated Nutrition Needs):  ?? Estimated Daily Total Kcal: 2150-2580  ?? Estimated Daily Protein (g): 103--129 (on HD and surgical wounds)    Estimated Intake vs Estimated Needs: Intake Less Than Needs    Nutrition Risk Level: High    Nutrition Interventions:   Continue current diet, Modify current ONS  Continued Inpatient Monitoring, Education not appropriate at this time    Nutrition Evaluation:   ?? Evaluation: No progress toward goals   ?? Goals: Pt to tolerate & consume >/=50% of meals & ONS    ?? Monitoring: Meal Intake, Supplement Intake, Diet Tolerance, Skin Integrity, Wound Healing, Constipation, Pertinent Labs, Weight, Ascites/Edema    See Adult Nutrition Doc Flowsheet for more detail.     Electronically signed by Gwenyth Ober, RD, LD on 07/22/15 at 12:32 PM    Contact Number: [phone]

## 2015-07-22 NOTE — Plan of Care (Signed)
Problem: Nutrition  Goal: Optimal nutrition therapy  Outcome: Ongoing  Nutrition Problem: Increased nutrient needs  Intervention: Food and/or Nutrient Delivery: Continue current diet, Modify current ONS  Nutritional Goals: Pt to tolerate & consume >/=50% of meals & ONS

## 2015-07-22 NOTE — Progress Notes (Signed)
Pulmonary Progress Note    CC: Respiratory failure; rhabdomyolysis; compartment syndrome    Subjective:  Had RUE and RLE dressing change yesterday. Receiving 1 unit PRBCs today.     ROS:  No SOB  No vomiting  No abdominal pain     Intake/Output Summary (Last 24 hours) at 07/22/15 0720  Last data filed at 07/22/15 0458   Gross per 24 hour   Intake           2201.7 ml   Output             2289 ml   Net            -87.3 ml         PHYSICAL EXAM:  Blood pressure (!) 137/50, pulse 99, temperature 98.9 ??F (37.2 ??C), temperature source Rectal, resp. rate 14, height 6\' 4"  (1.93 m), weight 189 lb 2.5 oz (85.8 kg), SpO2 99 %.on RA    Gen: Well developed; well nourished  Eyes: No scleral icterus. No conjunctival injection.   ENT: External appearance of ears and nose normal.  Neck: Trachea midline. No obvious mass. No visible thyroid enlargement    Respiratory: Clear to auscultation bilaterally on anterior exam, no accessory muscle use  Cardiovascular: Regular rate and rhythm, no appreciable murmurs. No edema.   Skin: Warm and dry. RUE and RLL wrapped in kerlix. Normal texture and turgor  Musculoskeletal: No cyanosis, clubbing or joint deformity.    Psychiatric: Normal mood and affect; sleepy, but responds appropriately to questions     Medications:    Scheduled Meds:  ??? sodium chloride  250 mL Intravenous Once   ??? lactobacillus  1 capsule Oral BID WC   ??? silver nitrate applicators   Topical Once   ??? levETIRAcetam  500 mg Oral Nightly   ??? ampicillin-sulbactam  1.5 g Intravenous Q12H   ??? sodium chloride flush  10 mL Intravenous 2 times per day   ??? docusate sodium  100 mg Oral BID       Continuous Infusions:       PRN Meds:  fentanNYL, sodium chloride flush, acetaminophen, oxyCODONE-acetaminophen **OR** oxyCODONE-acetaminophen, ondansetron    Labs:  CBC:   Recent Labs      07/20/15   0357  07/21/15   0414  07/22/15   0522   WBC  14.0*  13.3*  10.1   HGB  12.0*  9.2*  6.2*   HCT  37.0*  26.8*  18.0*   MCV  90.0  87.9  89.1   PLT   125*  111*  100*     BMP:   Recent Labs      07/21/15   0055  07/21/15   0414  07/21/15   1200  07/22/15   0522   NA  136  135*  136  136   K  4.8  4.7  4.5  4.5   CL  100  98*  100  98*   CO2  25  25  26  26    PHOS  3.2   --   2.5  3.6   BUN  20  19  16   37*   CREATININE  2.8*  2.7*  2.1*  4.4*     LIVER PROFILE:   Recent Labs      07/20/15   0357   07/21/15   0414  07/21/15   1200  07/22/15   0522   AST  930*   < >  1005*  785*  575*   ALT  181*   < >  270*  202*  151*   BILIDIR  <0.2   --   <0.2   --   <0.2   BILITOT  0.3   < >  0.3  0.3  <0.2   ALKPHOS  64   < >  52  44  50    < > = values in this interval not displayed.     PT/INR: No results for input(s): PROTIME, INR in the last 72 hours.  APTT:   Recent Labs      07/21/15   0414  07/21/15   0802  07/21/15   1252   APTT  77.3*  95.9*  137.5*     UA:  No results for input(s): NITRITE, COLORU, PHUR, LABCAST, WBCUA, RBCUA, MUCUS, TRICHOMONAS, YEAST, BACTERIA, CLARITYU, SPECGRAV, LEUKOCYTESUR, UROBILINOGEN, BILIRUBINUR, BLOODU, GLUCOSEU, AMORPHOUS in the last 72 hours.    Invalid input(s): KETONESU  No results for input(s): PH, PCO2, PO2 in the last 72 hours.    Cultures:  Blood (2/24): NGTD  Nasal MRSA: Negative    Chest imaging:  Chest imaging was reviewed by me and showed:  No new images    Assessment:   ?? Rhabdomyolysis  ?? Compartment Syndrome  ?? Acute kidney injury  ?? Seizure disorder  ?? Acute blood loss anemia  ?? Drug Overdose    Plan:    Rhabdomyolysis  - Initial CK level of >300K  - Daily CK levels    Compartment syndrome  - S/p fasciotomy to the RLE and RUE on 2/24  - Per orthopedic and vascular surgery  - Received 3 days of unasyn. Will discontinue     Acute kidney injury  - CRRT stopped yesterday. HD per nephrology. Remains anuric    Seizure disorder  - On keppra    Acute blood loss anemia  - May be due to blood loss from dressing change. Receiving 1 unit PRBCs. Check post-transfusion H/H      Prophylaxis  DVT- SCDs, no chemoprophylaxis given anemia      Carley Hammed, MD  [facility] Pulmonary, Critical Care and Sleep

## 2015-07-22 NOTE — Care Coordination-Inpatient (Signed)
Received order for ltac referral.  Met with patient and discussed ltac.  Offered choices of [facility].  Patient prefers [facility] due to location for family/friends visiting.  Referral made.  Will follow to assist with discharge.  Candiss Norse Ritamarie Arkin RN, 07/22/2015, 10:58 AM. [phone]

## 2015-07-22 NOTE — Progress Notes (Signed)
0800 Patient asleep in bed, awakens easily to verbal stimuli. Flat affect. C/o Right leg and arm pain with any touch to assess or repositioning of extremities. IVFs infusing per pump without difficulty. Foley with scant amount of dark brown/black urine with sediment noted.   0830 Assessment completed and documented. Wound vac dressing occlusive and patent.   0850 1 unit PRBC transfusion initiated. Dr Pasty Arch and Dr Odis Luster rounding on patient, new orders noted.   2956 Tolerating PRBC transfusion. Medicated with Percocet 1 tab PO per PRN order for Right Leg pain.   2130 Patient asleep at present time. Meal tray remains untouched at this time.  1130 PRBC transfused, no reaction noted. Patient dozing easily awakens to verbal stimuli. Foley with scant dark urine with sediment noted in tubing.  1200 Assessment unchanged. Resting quietly in bed.   1300 M. Brockerstette, NP, in to see patient and changed dressing to Right Forearm. Staples intact to RIght Forearm, W/D dressing intact.  1414 Additional unit of PRBC transfusion started. Patient still reluctant to eat, tolerating PO ensure supplement and liquids. Wound vac dressing intact and patent, canister changed, draining sero sanguanious.  1530 Medicated with PRN pain medication 1 tab PO for c/o right leg pain. Patient transferred x4 staff to speciality dolphin mattress utilizing Maxi slide, patient tolerated fair. Wound vac patent, dressings occlusive. Foley with scant dark amber/brown urine noted in tubing. IVFs infusing per pump without difficulty, tolerating PRBC transfusion.   1640 Assessment unchanged. Mother in to visit, updated on patient's condition and treatment plan. Feeding patient some spaghetti at present time.  1730 2nd unit of PRBC infused, no reaction noted. Mother at bedside, patient tolerating PO intake to present time. Dr Yolanda Manges contacted and informed of need for tunnel catheter for long term dialysis.  1850 Dialysis RN at bedside preparing to start  Dialysis. Patient resting quietly in bed.   Electronically signed by Boyd Kerbs, RN on 07/22/2015 at 6:59 PM

## 2015-07-22 NOTE — Progress Notes (Signed)
VASCULAR  POD#4  ??  Eating. Tolerating VAC  ??  VSS afeb  Palpable R DP pulse  Thigh/calf soft  4/5 dorsiflexion R foot  VAC in place  ??  A/P:  S/P R thigh and calf fasciotomies   Continue VAC with M-W-F changes.   Will arrange for [facility].  ??   Dolores Hoose

## 2015-07-22 NOTE — Plan of Care (Signed)
Problem: Falls - Risk of  Goal: Absence of falls  Outcome: Ongoing  Falling star program remains in place. Call light and personal belongings within reach. Frequent visual monitoring continues.  Toileting program inplace. Patient assisted in turning/repositioning at least once every 2 hours, and on a prn basis.   Electronically signed by Boyd Kerbs, RN on 07/22/2015 at 10:25 AM           Problem: Risk for Impaired Skin Integrity  Goal: Tissue integrity - skin and mucous membranes  Structural intactness and normal physiological function of skin and  mucous membranes.   Outcome: Ongoing  Monitoring patient skin integrity for skin breakdown, turning and repositioning with pillow support q2h per protocol. Patient encouraged and assisted with turning and repositioning self.   Electronically signed by Boyd Kerbs, RN on 07/22/2015 at 10:25 AM           Problem: Infection - Central Venous Catheter-Associated Bloodstream Infection:  Goal: Will show no infection signs and symptoms  Will show no infection signs and symptoms   Outcome: Ongoing  Site remains free of signs/symptoms for infection. No drainage, swelling, redness, pain, or warmth noted. Dressing changes continue per protocol; and on an as needed basis. Patient educated on proper Central Venous Catheter hygiene care. Electronically signed by Boyd Kerbs, RN on 07/22/2015 at 10:26 AM        Problem: Urinary Elimination:  Goal: Signs and symptoms of infection will decrease  Signs and symptoms of infection will decrease   Outcome: Ongoing  Foley catheter noted to be intact and secured with use of stat-lock.  No signs of infection noted to urine or peri area; including an absence of urine odor/discoloration, or peri area swelling/redness/tenderness/drainage. Peri care completed per shift and prn basis. Foley bag suspended from bed per protocol.  Continued monitoring for risks of infection remains in place. Will re-assess clinical need for continued foley  catheterization; Will discontinue per protocol/physician order.   Electronically signed by Boyd Kerbs, RN on 07/22/2015 at 10:26 AM        Problem: Pain:  Goal: Pain level will decrease  Pain level will decrease   Outcome: Ongoing  Continuing to monitor pain and discomfort.  Monitoring pain level on scale of 0-10. Non- pharmacological measures encouraged to reduce discomfort/pain.  PRN pain meds administeration continues when/if applicable as ordered by physician.   Electronically signed by Boyd Kerbs, RN on 07/22/2015 at 10:26 AM           Problem: Nutrition  Goal: Optimal nutrition therapy  No present signs of nutritional deficits. Patient denies oral sores/missing teeth/swallowing difficulties.  Patient denies change in appetite or weight. Patient refusing meal intake at present time, non-selective meal tray ordered. Will continue to monitor and encourage PO intake.   Electronically signed by Boyd Kerbs, RN on 07/22/2015 at 10:27 AM

## 2015-07-22 NOTE — Progress Notes (Addendum)
Hospitalist ICU Progress Note    CC: Non-traumatic rhabdomyolysis    Hosp course: 29yo AAM with hx of siezures and drug abuse. Found down at home, likely overdose of heroin - was down for about 12 hours, then had severe rhabdo and severe compartment syndrome of R side - has fasciotomies of R leg and R arm. Pt CPK was initially 270K. Also had shock liver. CRRT started on 2/25. Has R IJ vascath. Will transition to HD today - not making any urine at present. Has 3 incisions on legs. CPK down to 31K today.    Admit date: 07/18/2015  Days in hospital:  4    24 Hour Events: wound vac placed    Subjective: likely HD today - CRRT stopped yesterday - creatinine up to 4.4    ROS:  A comprehensive review of systems was negative except for: difficult ROS due to failure to make eye contact and not talking  .    Objective:    Visit Vitals   ??? BP 139/66   ??? Pulse 95   ??? Temp 99.2 ??F (37.3 ??C) (Oral)   ??? Resp 15   ??? Ht  (1.93 m)   ??? Wt 189 lb 2.5 oz (85.8 kg)   ??? SpO2 100%   ??? BMI 23.02 kg/m2       Gen: acutely ill appearing  HEENT: NC/AT, moist mucous membranes, no oropharyngeal erythema or exudate    Neck: supple, trachea midline, no anterior cervical or SC LAD  Heart:  Normal s1/s2, RRR, no murmurs, gallops, or rubs. R leg edema  Lungs:  diminished bilaterally, no wheeze, no rales, no rhonchi, no crackles, no use of accessory muscles  Abd: bowel sounds present, soft, nontender, nondistended, no masses  Extrem:  No clubbing, cyanosis,  R leg edema, peripheral pulses 2+, brisk capillary refill  Skin: no rashes or lesions, normal color/perfusion  Psych:  A & O x3    Neuro: grossly intact, moves all four extremities.        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  Acute rhabdo - improving with HD  2.  Acute renal failure - dialysis dependent for now - will need tunneled catheter  3.  Compartment syndrome of R arm and R leg - wound vac placed to leg  4.  Acute blood loss anemia - will get 1u pRBC for hgb drop - blood loss likely from wound vac  5.  Opiate abuse - pt snorts heroin apparently - on prn pi meds for wound care and pain        Prognosis:  Fair    Code status:  FULL  DVT prophylaxis:  Lovenox   SQ Heparin   SCDs because wound bleeding  warfarin/oral direct thrombin inhibitor  Encourage ambulation    GI prophylaxis:  PPI/H2blocker   not indicated    Disposition  transfer to monitored bed    Medications:  Scheduled Meds:  ??? sodium chloride  250 mL Intravenous Once   ??? lactobacillus  1 capsule Oral BID WC   ??? silver nitrate applicators   Topical Once   ??? levETIRAcetam  500 mg Oral Nightly   ??? ampicillin-sulbactam  1.5 g Intravenous Q12H   ??? sodium chloride flush  10 mL Intravenous 2 times per day   ??? docusate sodium  100 mg Oral BID       PRN Meds:  fentanNYL, sodium chloride flush, acetaminophen, oxyCODONE-acetaminophen **OR** oxyCODONE-acetaminophen, ondansetron    IV:  ??? sodium chloride           Intake/Output Summary (Last 24 hours) at 07/22/15 0852  Last data filed at 07/22/15 0734   Gross per 24 hour   Intake           2301.7 ml   Output             2093 ml   Net            208.7 ml       Results:  CBC: Recent Labs      07/20/15   0357  07/21/15   0414  07/22/15   0522   WBC  14.0*  13.3*  10.1   HGB  12.0*  9.2*  6.2*   HCT  37.0*  26.8*  18.0*   MCV  90.0  87.9  89.1   PLT  125*  111*  100*     BMP: Recent Labs      07/21/15   0055  07/21/15   0414  07/21/15   1200  07/22/15   0522   NA  136  135*  136  136   K  4.8   4.7  4.5  4.5   CL  100  98*  100  98*   CO2  25  25  26  26    PHOS  3.2   --   2.5  3.6   BUN  20  19  16   37*   CREATININE  2.8*  2.7*  2.1*  4.4*     Mag: No results for input(s): MAG in the last 72 hours.  LIVER PROFILE:   Recent Labs      07/20/15   0357   07/21/15   0414  07/21/15   1200  07/22/15   0522   AST  930*   < >  1005*  785*  575*   ALT  181*   < >  270*  202*  151*   BILIDIR  <0.2   --   <0.2   --   <0.2   BILITOT  0.3   < >  0.3  0.3  <0.2   ALKPHOS  64   < >  52  44  50    < > = values in this interval not displayed.     PT/INR: No results for input(s): PROTIME, INR in the last 72 hours.  APTT:   Recent Labs      07/21/15   0414  07/21/15   0802  07/21/15   1252   APTT  77.3*  95.9*  137.5*     UA:No results for input(s): NITRITE, COLORU, PHUR, LABCAST, WBCUA, RBCUA, MUCUS, TRICHOMONAS, YEAST, BACTERIA, CLARITYU, SPECGRAV, LEUKOCYTESUR, UROBILINOGEN, BILIRUBINUR, BLOODU, GLUCOSEU, AMORPHOUS in the last 72 hours.    Invalid input(s): KETONESU    ABG: Lab Results   Component Value Date    PHART 7.339 07/19/2015  PCO2ART 38.3 07/19/2015    PO2ART 238.0 07/19/2015    HCO3ART 20.1 07/19/2015    BEART -4.8 07/19/2015    TCO2ART 21.2 07/19/2015    O2SATART 99.6 07/19/2015       Lab Results   Component Value Date    CALCIUM 7.6 (L) 07/22/2015    PHOS 3.6 07/22/2015             Electronically signed by Markus Jarvis, MD on 07/22/2015 at 8:52 AM

## 2015-07-22 NOTE — Progress Notes (Signed)
To bedside for scheduled first HD tx. Report received from ICU RN Lawson Fiscal. Accessed RIJ temp cath per protocol and tx initiated. Net UF goal set for .5L         07/22/15 1900   Vital Signs   BP (!) 147/68   Temp 99.8 ??F (37.7 ??C)   Weight 185 lb 10 oz (84.2 kg)   Weight Method Actual;Bed scale   Percent Weight Change -1.86        07/22/15 2300   Post-Hemodialysis Assessment   Post-Treatment Procedures Blood returned;Catheter capped, clamped and heparinized x 2 ports   Machine Disinfection Process Acid/Vinegar Clean;Bleach;Exterior Engineer, agricultural Volume (ml) 400 ml   Total Liters Processed (l/min) 70 l/min   Dialyzer Clearance Lightly streaked   Duration of Treatment (minutes) 225 minutes   Heparin amount administered during treatment (units) 0 units   Hemodialysis Intake (ml) 1700 ml   Hemodialysis Output (ml) 2200 ml   NET Removed (ml) 500 ml   Tolerated Treatment Good          07/22/15 2300   Vital Signs   BP (!) 152/69   Temp 99.8 ??F (37.7 ??C)   Weight 184 lb 8.4 oz (83.7 kg)   Weight Method Actual   Percent Weight Change -0.59

## 2015-07-22 NOTE — Progress Notes (Signed)
Nephrology Progress Note  [phone]  [phone]   [email or web address]    Patient:  Roy Weaver   DOB: July 28, 1985    CC:  Found down    Subjective:  -pt seen and examined  -PMSHx and meds reviewed  -No family at bedside    Started on CRRT  for hyperkalemia post op and -taken to OR for fasciotomy right forearm 07/19/2015  CRRT stopped yesterday    CK trending down  BP stable  Remains oliguric    ROS:    pain controlled neg chest pain/SOB Receiving PRBC        Meds:  Current Facility-Administered Medications   Medication Dose Route Frequency Provider Last Rate Last Dose   ??? 0.9 % sodium chloride bolus  250 mL Intravenous Once Siddharth K Mushrif, MD 20 mL/hr at 07/22/15 0823 250 mL at 07/22/15 0823   ??? levETIRAcetam (KEPPRA) tablet 500 mg  500 mg Oral Nightly Mukti Patel-Chamberlin, MD       ??? fentaNYL (SUBLIMAZE) injection 50 mcg  50 mcg Intravenous Q4H PRN Loann Quill, NP   50 mcg at 07/21/15 1233   ??? sodium chloride flush 0.9 % injection 10 mL  10 mL Intravenous 2 times per day Filbert Berthold, MD   10 mL at 07/22/15 8657   ??? sodium chloride flush 0.9 % injection 10 mL  10 mL Intravenous PRN Filbert Berthold, MD       ??? acetaminophen (TYLENOL) tablet 650 mg  650 mg Oral Q4H PRN Filbert Berthold, MD       ??? oxyCODONE-acetaminophen (PERCOCET) 5-325 MG per tablet 1 tablet  1 tablet Oral Q4H PRN Filbert Berthold, MD   1 tablet at 07/22/15 8469    Or   ??? oxyCODONE-acetaminophen (PERCOCET) 5-325 MG per tablet 2 tablet  2 tablet Oral Q4H PRN Filbert Berthold, MD   2 tablet at 07/21/15 1523   ??? docusate sodium (COLACE) capsule 100 mg  100 mg Oral BID Filbert Berthold, MD   100 mg at 07/22/15 6295   ??? ondansetron (ZOFRAN) injection 4 mg  4 mg Intravenous Q6H PRN Filbert Berthold, MD           Vitals:  Visit Vitals   ??? BP 137/65   ??? Pulse 98   ??? Temp 99.7 ??F (37.6 ??C) (Oral)   ??? Resp 14   ??? Ht  (1.93 m)   ??? Wt 189 lb 2.5 oz (85.8 kg)   ??? SpO2 99%   ??? BMI 23.02 kg/m2       Physical Exam:  Gen: Resting in bed, NAD.    HEENT: MMM, OP  clear.  R IJ Vasc cath in place  CV: RRR no m/r/g.  No S3.  Lungs: CTA-B, no resp distress  Abd: S/NT +BS  Ext: No edema, no cyanosis-dressing to right arm intact, RLE dressing C/D/I  Skin: Warm.  No rashes appreciated.    Labs:  BMP:    Lab Results   Component Value Date    NA 136 07/22/2015    K 4.5 07/22/2015    CL 98 07/22/2015    CO2 26 07/22/2015    BUN 37 07/22/2015    LABALBU 2.2 07/22/2015    CREATININE 4.4 07/22/2015    CREATININE 1.0 08/17/2011    CALCIUM 7.6 07/22/2015    GFRAA 19 07/22/2015    GFRAA 157 08/19/2011    LABGLOM 16 07/22/2015    GLUCOSE 101 07/22/2015  Assessment/Plan:  AKI: due to rhabdomyolysis-initiated on CRRT  due to hyperkalemia CRRT stopped 2/27  -hemodynamically stable  -will do IHD today Will need TDC placement Vasc surgery aware    FEN: hyperkalemia: 2/2 rhabdo corrected    Rhabdomyolysis:  With compartment syndrome S/P fasciotomies CK is improving 31 K today    Transamnitis: ischemic injury, improving    Anemia receiving PRBC    compartment syndrome right: s/p fasciotomy  -per orthopedic surgery    Polysubstance abuse: per primary-SW referral when stable        Earl Gala, MD  07/22/2015

## 2015-07-22 NOTE — Progress Notes (Signed)
Physical Therapy  Progress Note  Subjective:  Pt reports extreme pain in his RLE/RUE this afternoon (10/10).  Nurse requesting assist to transfer pt to a different bed.    Objective:  Pt required total assist x 4 to move from bed > bed using Maxi-Slide.  Prior to transfer, pt had completed LLE quad sets x 10, and LLE ankle pumps x 10 with PT.  He remains NWB RUE/RLE, which significantly limits his independence with all mobility tasks.    Assessment:  Pt continues to function well-below his baseline (Independent with mobility tasks).  Continue to assess for D/C plan pending pt's progress; will likely require continued therapy upon D/C.  Time In: 1545  Time Coded Treatment: 15 min.   Total Time: 20 min.   Electronically signed by Renita Papa, PT [phone] on 07/22/2015 at 4:20 PM

## 2015-07-23 ENCOUNTER — Inpatient Hospital Stay: Admit: 2015-07-23 | Primary: Internal Medicine

## 2015-07-23 ENCOUNTER — Encounter: Primary: Internal Medicine

## 2015-07-23 LAB — CBC WITH AUTO DIFFERENTIAL
Basophils %: 0.1 %
Basophils Absolute: 0 10*3/uL (ref 0.0–0.2)
Eosinophils %: 1.2 %
Eosinophils Absolute: 0.1 10*3/uL (ref 0.0–0.6)
Hematocrit: 22.8 % — ABNORMAL LOW (ref 40.5–52.5)
Hemoglobin: 7.4 g/dL — ABNORMAL LOW (ref 13.5–17.5)
Lymphocytes %: 11.4 %
Lymphocytes Absolute: 1 10*3/uL (ref 1.0–5.1)
MCH: 29.7 pg (ref 26.0–34.0)
MCHC: 32.4 g/dL (ref 31.0–36.0)
MCV: 91.9 fL (ref 80.0–100.0)
MPV: 7.5 fL (ref 5.0–10.5)
Monocytes %: 8.8 %
Monocytes Absolute: 0.8 10*3/uL (ref 0.0–1.3)
Neutrophils %: 78.5 %
Neutrophils Absolute: 6.9 10*3/uL (ref 1.7–7.7)
Platelets: 78 10*3/uL — ABNORMAL LOW (ref 135–450)
RBC: 2.49 M/uL — ABNORMAL LOW (ref 4.20–5.90)
RDW: 14.4 % (ref 12.4–15.4)
WBC: 8.8 10*3/uL (ref 4.0–11.0)

## 2015-07-23 LAB — HEPATIC FUNCTION PANEL
ALT: 104 U/L — ABNORMAL HIGH (ref 10–40)
AST: 434 U/L — ABNORMAL HIGH (ref 15–37)
Albumin: 2.1 g/dL — ABNORMAL LOW (ref 3.4–5.0)
Alkaline Phosphatase: 56 U/L (ref 40–129)
Bilirubin, Direct: <0.2 mg/dL (ref 0.0–0.3)
Total Bilirubin: 0.3 mg/dL (ref 0.0–1.0)
Total Protein: 4.7 g/dL — ABNORMAL LOW (ref 6.4–8.2)

## 2015-07-23 LAB — HEPATITIS PANEL, ACUTE
Hep A IgM: NONREACTIVE
Hep B Core Ab, IgM: NONREACTIVE
Hep B S Ag Interp: NONREACTIVE
Hep C Ab Interp: REACTIVE — AB

## 2015-07-23 LAB — CULTURE, BLOOD 2: Culture, Blood 2: NO GROWTH

## 2015-07-23 LAB — BASIC METABOLIC PANEL
Anion Gap: 10 (ref 3–16)
BUN: 34 mg/dL — ABNORMAL HIGH (ref 7–20)
CO2: 23 mmol/L (ref 21–32)
Calcium: 7.7 mg/dL — ABNORMAL LOW (ref 8.3–10.6)
Chloride: 102 mmol/L (ref 99–110)
Creatinine: 4.6 mg/dL — ABNORMAL HIGH (ref 0.9–1.3)
GFR African American: 18 — AB (ref >60)
GFR Non-African American: 15 — AB (ref >60)
Glucose: 100 mg/dL — ABNORMAL HIGH (ref 70–99)
Potassium: 4.7 mmol/L (ref 3.5–5.1)
Sodium: 135 mmol/L — ABNORMAL LOW (ref 136–145)

## 2015-07-23 LAB — PROTIME-INR
INR: 0.86 (ref 0.85–1.15)
Protime: 9.7 s (ref 9.6–13.0)

## 2015-07-23 LAB — CULTURE BLOOD #1: Blood Culture, Routine: NO GROWTH

## 2015-07-23 LAB — POCT GLUCOSE: POC Glucose: 108 mg/dl — ABNORMAL HIGH (ref 70–99)

## 2015-07-23 LAB — MAGNESIUM: Magnesium: 2.4 mg/dL (ref 1.80–2.40)

## 2015-07-23 LAB — PHOSPHORUS: Phosphorus: 3 mg/dL (ref 2.5–4.9)

## 2015-07-23 LAB — CK: Total CK: 19047 U/L — ABNORMAL HIGH (ref 39–308)

## 2015-07-23 MED ORDER — HEPARIN SODIUM (PORCINE) 1000 UNIT/ML IJ SOLN
1000 UNIT/ML | INTRAMUSCULAR | Status: DC | PRN
Start: 2015-07-23 — End: 2015-07-29
  Administered 2015-07-23: 04:00:00 3000 [IU]
  Administered 2015-07-26: 06:00:00 3200 [IU]
  Administered 2015-07-27: 18:00:00 3000 [IU]

## 2015-07-23 MED ORDER — MIDAZOLAM HCL 2 MG/2ML IJ SOLN
2 | INTRAMUSCULAR | Status: AC
Start: 2015-07-23 — End: 2015-07-23

## 2015-07-23 MED ORDER — HEPARIN (PORCINE) IN NACL 2-0.9 UNIT/ML-% IJ SOLN
2-0.9 | INTRAMUSCULAR | Status: AC
Start: 2015-07-23 — End: 2015-07-23

## 2015-07-23 MED ORDER — FENTANYL CITRATE (PF) 100 MCG/2ML IJ SOLN
100 | INTRAMUSCULAR | Status: AC
Start: 2015-07-23 — End: 2015-07-23

## 2015-07-23 MED ORDER — MIDAZOLAM HCL 2 MG/2ML IJ SOLN
2 MG/ML | Freq: Every day | INTRAMUSCULAR | Status: AC | PRN
Start: 2015-07-23 — End: 2015-07-23
  Administered 2015-07-23: 16:00:00 1 via INTRAVENOUS

## 2015-07-23 MED FILL — FENTANYL CITRATE (PF) 100 MCG/2ML IJ SOLN: 100 MCG/2ML | INTRAMUSCULAR | Qty: 2

## 2015-07-23 MED FILL — DOK 100 MG PO CAPS: 100 MG | ORAL | Qty: 1

## 2015-07-23 MED FILL — OXYCODONE-ACETAMINOPHEN 5-325 MG PO TABS: 5-325 MG | ORAL | Qty: 1

## 2015-07-23 MED FILL — OXYCODONE-ACETAMINOPHEN 5-325 MG PO TABS: 5-325 MG | ORAL | Qty: 2

## 2015-07-23 MED FILL — HEPARIN (PORCINE) IN NACL 2-0.9 UNIT/ML-% IJ SOLN: INTRAMUSCULAR | Qty: 500

## 2015-07-23 MED FILL — MIDAZOLAM HCL 2 MG/2ML IJ SOLN: 2 MG/ML | INTRAMUSCULAR | Qty: 2

## 2015-07-23 MED FILL — LEVETIRACETAM 500 MG PO TABS: 500 MG | ORAL | Qty: 1

## 2015-07-23 NOTE — Plan of Care (Signed)
Patient admitted to room 1310 from ICU.Placed on tele monitor, NS 86. Oriented to room, call light. Plan of care reviewed with patient. Pt is resting in bed  no s/s of distress noted. Assessment completed,safety precautions in place; call light and bedside table within reach. Pt encouraged to call for needs. Will continue to monitor.

## 2015-07-23 NOTE — Progress Notes (Signed)
[facility]   Progress Note      S/P :  SUBJECTIVE  In bed. Alert and oriented. . Pain is   described in right leg more than right arm and with the intensity of moderate. Pain is described as aching.       OBJECTIVE              Physical                      VITALS:    Visit Vitals   ??? BP (!) 161/78   ??? Pulse 88   ??? Temp 99.5 ??F (37.5 ??C) (Oral)   ??? Resp 13   ??? Ht  (1.93 m)   ??? Wt 187 lb 13.3 oz (85.2 kg)   ??? SpO2 97%   ??? BMI 22.86 kg/m2                       MUSCULOSKELETAL:  right hand NVI. Wiggles fingers to command. Radial pulses are palpable.   Minimal right hand swelling noted                NEUROLOGIC:                                  Sensory:  Touch:  Right Upper Extremity:  normal                                                 Surgical wound appears clean and dry with Kerlix right forearm. Pt reprots dressing was changed today per nurse. Nurse confirms.     Data       CBC:   Lab Results   Component Value Date    WBC 8.8 07/23/2015    RBC 2.49 07/23/2015    HGB 7.4 07/23/2015    HCT 22.8 07/23/2015    MCV 91.9 07/23/2015    MCH 29.7 07/23/2015    MCHC 32.4 07/23/2015    RDW 14.4 07/23/2015    PLT 78 07/23/2015    MPV 7.5 07/23/2015        WBC:    Lab Results   Component Value Date    WBC 8.8 07/23/2015        Hemoglobin/Hematocrit:    Lab Results   Component Value Date    HGB 7.4 07/23/2015    HCT 22.8 07/23/2015        PT/INR:    Lab Results   Component Value Date    PROTIME 9.7 07/23/2015    INR 0.86 07/23/2015              Current Inpatient Medications             Current Facility-Administered Medications: levETIRAcetam (KEPPRA) tablet 500 mg, 500 mg, Oral, Q24H  heparin (porcine) injection 3,000 Units, 3,000 Units, Intercatheter, PRN  fentaNYL (SUBLIMAZE) injection 50 mcg, 50 mcg, Intravenous, Q4H PRN  sodium chloride flush 0.9 % injection 10 mL, 10 mL, Intravenous, 2 times per day  sodium chloride flush 0.9 % injection 10 mL, 10 mL, Intravenous, PRN  acetaminophen (TYLENOL) tablet 650  mg, 650 mg, Oral, Q4H PRN  oxyCODONE-acetaminophen (PERCOCET) 5-325 MG per tablet 1 tablet, 1 tablet, Oral, Q4H PRN **OR** oxyCODONE-acetaminophen (PERCOCET)  5-325 MG per tablet 2 tablet, 2 tablet, Oral, Q4H PRN  docusate sodium (COLACE) capsule 100 mg, 100 mg, Oral, BID  ondansetron (ZOFRAN) injection 4 mg, 4 mg, Intravenous, Q6H PRN    ASSESSMENT AND PLAN      Right forearm compartment syndrome. Post fasciotomy, stable  I will remove staples and vessel loop of rigth forearm on Friday. Wound then to close by secondary intention with wet to dry.   Plan FU Dr Su Hoff in 10 days after surgery.     Jackelyn Poling Hana Trippett  07/23/2015  4:32 PM

## 2015-07-23 NOTE — Progress Notes (Signed)
Physical Therapy  Facility/Department: [facility]  Daily Treatment Note  NAME: Roy Weaver  DOB: January 12, 1986  MRN: [id number]    Date of Service: 07/23/2015    Patient Diagnosis(es):   Patient Active Problem List    Diagnosis Date Noted   ??? Acute blood loss anemia    ??? Traumatic rhabdomyolysis (HCC)    ??? Seizure disorder (HCC)    ??? Acute respiratory failure with hypoxia (HCC) 07/19/2015   ??? Non-traumatic rhabdomyolysis    ??? AKI (acute kidney injury) (HCC)    ??? Traumatic compartment syndrome (HCC)    ??? 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 Post-ictal state (HCC) 07/12/2015   ??? Breakthrough seizure (HCC)    ??? Visit for suture removal 03/25/2015   ??? Seizure (HCC) 04/10/2014   ??? Aspiration pneumonia of right lung (HCC) 03/30/2014   ??? 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 Seizures (HCC) 02/14/2014   ??? Partial symptomatic epilepsy with complex partial seizures, intractable, without status epilepticus (HCC)    ??? Noncompliance with medication regimen    ??? Partial epilepsy with impairment of consciousness (HCC) 11/07/2013   ??? Delirium    ??? Drug use    ??? Recurrent seizures (HCC) 09/14/2013   ??? History of nonadherence to medical treatment 09/14/2013   ??? Migraine 05/03/2013   ??? Seizures (HCC) 04/09/2013       Past Medical History   Diagnosis Date   ??? AKI (acute kidney injury) (HCC)    ??? Migraine    ??? Psychiatric problem    ??? Seizures (HCC)    ??? Traumatic rhabdomyolysis (HCC)      History reviewed. No pertinent past surgical history.    Restrictions  Restrictions/Precautions  Restrictions/Precautions: Fall Risk, Weight Bearing  Lower Extremity Weight Bearing Restrictions  Right Lower Extremity Weight Bearing: Weight Bearing As Tolerated  Upper Extremity Weight Bearing Restrictions  Right Upper Extremity Weight Bearing: Non Weight Bearing  Position Activity Restriction  Other position/activity restrictions:   Fasciotomy R Forearm, R Thigh, and R Calf  regions.  Heroin Abuse.   Spoke with Dr. Yolanda Manges at bedside, advised PT that pt is WBAT R LE.  (Need to check with Dr. Su Hoff to clarify if able to wgt bear R UE, currently NWB order).   Subjective   General  Chart Reviewed: Yes  Additional Pertinent Hx: 30 y/o male admit 07/18/15 with Heroin Overdose, Traumatic Rhabdo (down at least 12 hr, found bathroom floor), R UE/LE Compartment Syndromes, AKI.      07/18/15 S/P R Forearm Fasciotomy of Flexor Compartment and R Thigh/Calf Fasciotomies.  PMH as noted including Heroin Abuse.   Response To Previous Treatment: Patient with no complaints from previous session.  Family / Caregiver Present: No  Referring Practitioner: Dr. Su Hoff., Dr. Yolanda Manges.  Subjective  Subjective: Pt agreeable to PT Rx.   Pain Screening  Patient Currently in Pain: Yes  Pain Assessment  Pain Assessment: 0-10  Pain Level: 10  Pain Type: Surgical pain  Pain Location: Arm;Leg  Pain Orientation: Right  Vital Signs  Patient Currently in Pain: Yes       Orientation  Orientation  Overall Orientation Status: Within Functional Limits  Objective   Bed Mobility  Supine to Sit: Dependent/Total (Assist x 3. )  Sit to Supine: Dependent/Total (Assist x 3. )  Transfers  Bed to Chair: Unable to assess  Comment: Currently pt requiring assist x 3 for bed mob.  Comment: Assist nursing with dressing changes R UE/LE.                Assessment   Body structures, Functions, Activity limitations: Decreased functional mobility ;Decreased ROM;Decreased strength;Decreased endurance  Assessment: 30 y/o male admit 07/18/15 with Heroin Overdose, Traumatic Rhabdo (down at least 12 hr), AKI and R UE/LE Compartment Syndromes.  S/P R UE/LE Fasciotomies 07/18/15, currently undergoing CRRT for AKI.  PTA pt living with parent, 2 story house with steps to enter/2nd floor bed/bath.  Per SW, anticipate d/c LTAC setting with cont PT.   Prognosis: Fair  Patient Education: Role of PT, POC  REQUIRES PT FOLLOW UP: Yes  Activity  Tolerance  Activity Tolerance: Patient limited by pain  PT Equipment Recommendations  Other: Will monitor for potential equipt needs.        Discharge Recommendations:  Continue to assess pending progress    G-Code       Goals  Short term goals  Time Frame for Short term goals: Upon d/c acute care setting.   Short term goal 1: Pt participating in approp ROM/Strength Exs.   Short term goal 2: Bed Mob Mod x 2.   Short term goal 3: Transfers with assist device/lift equipt Mod/Max x 2.   Patient Goals   Patient goals : Return home.     Plan    Plan  Times per week: 3-5x week as approp while in acute care setting.   Current Treatment Recommendations: Strengthening, ROM, Building services engineer, Teacher, early years/pre, Patent attorney, Patient/Caregiver Education & Training  Safety Devices  Type of devices: Bed alarm in place, Call light within reach, Left in bed, Nurse notified     Therapy Time   Individual Concurrent Group Co-treatment   Time In 1200         Time Out 1230         Minutes 30                 Dorena Bodo, PT

## 2015-07-23 NOTE — Progress Notes (Signed)
Hospitalist ICU Progress Note    CC: Non-traumatic rhabdomyolysis    Hosp course: 30yo AAM with hx of siezures and drug abuse. Found down at home, likely overdose of heroin - was down for about 12 hours, then had severe rhabdo and severe compartment syndrome of R side - has fasciotomies of R leg and R arm. Pt CPK was initially 270K. Also had shock liver. CRRT started on 2/25. Has R IJ vascath. Will have TDC placed for iHD Has 3 incisions on legs. CPK trending down.      Admit date: 07/18/2015  Days in hospital:  5    24 Hour Events: Wound vac being changed    Subjective: Wound vac being changed.  Wounds look good.  Granulation tissue forming.  Plan for [facility] today.     ROS:  A comprehensive review of systems was negative except for: difficult ROS due to failure to make eye contact and not talking  .    Objective:    Visit Vitals   ??? BP (!) 147/69   ??? Pulse 88   ??? Temp 100 ??F (37.8 ??C) (Rectal)   ??? Resp 15   ??? Ht  (1.93 m)   ??? Wt 187 lb 13.3 oz (85.2 kg)   ??? SpO2 95%   ??? BMI 22.86 kg/m2       Gen: NAD  HEENT: NC/AT, moist mucous membranes, no oropharyngeal erythema or exudate    Neck: supple, trachea midline, no anterior cervical or SC LAD  Heart:  Normal s1/s2, RRR, no murmurs, gallops, or rubs. R leg edema  Lungs:  diminished bilaterally, no wheeze, no rales, no rhonchi, no crackles, no use of accessory muscles  Abd: bowel sounds present, soft, nontender, nondistended, no masses  Extrem:  No clubbing, cyanosis,  R leg edema, peripheral pulses 2+, brisk capillary refill  Skin: no rashes or lesions, normal color/perfusion  Psych:  A & O x3    Neuro: grossly intact, moves all four extremities.        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  Acute rhabdo - improving with HD  2.  Acute renal failure - dialysis dependent for now - [facility] today  3.  Compartment syndrome of R arm and R leg - wound vac placed to leg  4.  Acute blood loss anemia - received 1u pRBC for hgb drop - blood loss likely from wound vac  5.  Opiate abuse - pt snorts heroin apparently - on prn pi meds for wound care and pain        Prognosis:  Fair    Code status:  FULL  DVT prophylaxis:  Lovenox   SQ Heparin   SCDs because wound bleeding  warfarin/oral direct thrombin inhibitor  Encourage ambulation    GI prophylaxis:  PPI/H2blocker   not indicated    Disposition  transfer to monitored bed    Medications:  Scheduled Meds:  ???  levETIRAcetam  500 mg Oral Q24H   ??? sodium chloride flush  10 mL Intravenous 2 times per day   ??? docusate sodium  100 mg Oral BID       PRN Meds:  heparin (porcine), fentanNYL, sodium chloride flush, acetaminophen, oxyCODONE-acetaminophen **OR** oxyCODONE-acetaminophen, ondansetron    IV:         Intake/Output Summary (Last 24 hours) at 07/23/15 0934  Last data filed at 07/23/15 0800   Gross per 24 hour   Intake           3002.3 ml   Output             3167 ml   Net           -164.7 ml       Results:  CBC:   Recent Labs      07/21/15   0414  07/22/15   0522  07/22/15   1305  07/22/15   1745  07/23/15   0424   WBC  13.3*  10.1   --    --   8.8   HGB  9.2*  6.2*  6.6*  7.5*  7.4*   HCT  26.8*  18.0*  19.4*  21.6*  22.8*   MCV  87.9  89.1   --    --   91.9   PLT  111*  100*   --    --   78*     BMP:   Recent Labs      07/21/15   1200  07/22/15   0522  07/23/15   0424   NA  136  136  135*   K  4.5  4.5  4.7   CL  100  98*  102   CO2  26  26  23    PHOS  2.5  3.6  3.0   BUN  16  37*  34*   CREATININE  2.1*   4.4*  4.6*     Mag: No results for input(s): MAG in the last 72 hours.  LIVER PROFILE:   Recent Labs      07/21/15   0414  07/21/15   1200  07/22/15   0522  07/23/15   0424   AST  1005*  785*  575*  434*   ALT  270*  202*  151*  104*   BILIDIR  <0.2   --   <0.2  <0.2   BILITOT  0.3  0.3  <0.2  0.3   ALKPHOS  52  44  50  56     PT/INR:   Recent Labs      07/23/15   0900   PROTIME  9.7   INR  0.86     APTT:   Recent Labs      07/21/15   0414  07/21/15   0802  07/21/15   1252   APTT  77.3*  95.9*  137.5*     UA:No results for input(s): NITRITE, COLORU, PHUR, LABCAST, WBCUA, RBCUA, MUCUS, TRICHOMONAS, YEAST, BACTERIA, CLARITYU, SPECGRAV, LEUKOCYTESUR, UROBILINOGEN, BILIRUBINUR, BLOODU, GLUCOSEU, AMORPHOUS in the last 72 hours.    Invalid input(s): KETONESU    ABG:   Lab Results   Component Value Date    PHART 7.339 07/19/2015    PCO2ART 38.3 07/19/2015    PO2ART 238.0 07/19/2015    HCO3ART 20.1 07/19/2015    BEART -4.8 07/19/2015    TCO2ART 21.2 07/19/2015    O2SATART 99.6  07/19/2015       Lab Results   Component Value Date    CALCIUM 7.7 (L) 07/23/2015    PHOS 3.0 07/23/2015             Electronically signed by Despina Hidden, MD on 07/23/2015 at 9:34 AM

## 2015-07-23 NOTE — Progress Notes (Signed)
1610: received call from wound care nurse asking RN to pre-medicate pt with Percocet for wound vac change. RN called IR to try to get time for [facility] placement - unable to get time from that department. RN called dialysis to see what time pt was scheduled for HD today - per [facility] RN, pt is not on schedule for today.    0800: assessment as noted. Pt in bed, eyes closed. Alert to voice. Oriented x4. No acute distress. No complaints. Pt pre-medicated for wound vac change. Dolphin bed in place. Pt educated on being NPO until after dialysis line is placed. IR called and asked if they could do it at 0900, informed them that wound care was on their way for vac change, and that would take a couple hours. Will plan on around 1130. Pt appears comfortable in bed. Will continue to monitor. Surgery rounds complete.    0815: received call from IR [facility] will not be able to be placed until this afternoon.    9604: wound care nurses bedside. Informed them that pt received Percocet this morning and that pt is NPO for [facility] placement.     1040: wound care recently finished with vac change. Pt to IR. RN escorted pt to department. Report given to Trey Paula, RN in IR. Including that RN has not been able to obtain consent due to helping with wound vac change. This RN was informed that IR would obtain necessary consent.    1135: received report from [place], RN in IR.     1147: pt returned to room 1310 from IR. Nephrologist bedside - rounds complete - see orders.    1200: assessment as noted. Pt c/o pain - medicated with PRN Percocet. PT bedside. Pt was sat EOB for 2-3 minutes with assistance of 2 RNs and PT - pt was max assist to dependent for this activity. Returned to lying in bed, repositioned. Will continue to monitor.     1330: dressing on right forearm changed per this RN. Assessment as noted. Pt tolerated dressing change fairly well. Will monitor.     1400: RN to room to answer call light. Pt said he is ready to eat now. Renal diet menu  given. Educated pt on how to order meals, and try line times. He v/u - meal ordered. Will assist pt with set-up on arrival if needed.     1700: assessment as noted. Pt comfortable in bed. No distress. Wound vac canister changed at this time due to being full. Output documented. Will continue to monitor.     1810: spoke with attending physician regarding platelet count and removing old vas cath. Per MD, keep vas cath in place at this time due to low platelets.     1933: report given, handoff completed bedside.     Electronically signed by Anise Salvo, BSN, RN, CCRN on 07/23/2015 at 7:34 PM

## 2015-07-23 NOTE — Progress Notes (Signed)
Pulmonary Progress Note    CC: Respiratory failure; rhabdomyolysis; compartment syndrome    Subjective:  Received 2 unit PRBCs yesterday. Had HD catheter placed today.     ROS:  No SOB  No vomiting  No abdominal pain     Intake/Output Summary (Last 24 hours) at 07/23/15 0805  Last data filed at 07/23/15 0400   Gross per 24 hour   Intake           3102.3 ml   Output             3157 ml   Net            -54.7 ml         PHYSICAL EXAM:  Blood pressure (!) 149/66, pulse 87, temperature 99.9 ??F (37.7 ??C), temperature source Rectal, resp. rate 17, height  (1.93 m), weight 187 lb 13.3 oz (85.2 kg), SpO2 96 %.on RA    Gen: Well developed; well nourished  Eyes: No scleral icterus. No conjunctival injection.   ENT: External appearance of ears and nose normal.  Neck: Trachea midline. No obvious mass. No visible thyroid enlargement    Respiratory: Clear to auscultation bilaterally on anterior exam, no accessory muscle use  Cardiovascular: Regular rate and rhythm, no appreciable murmurs. No edema.   Skin: Warm and dry. RUE wrapped in kerlix. Wound VAC to RLE. Normal texture and turgor  Musculoskeletal: No cyanosis, clubbing or joint deformity.    Psychiatric: Normal mood and affect; sleepy, but responds appropriately to questions     Medications:    Scheduled Meds:  ??? levETIRAcetam  500 mg Oral Q24H   ??? sodium chloride flush  10 mL Intravenous 2 times per day   ??? docusate sodium  100 mg Oral BID       Continuous Infusions:       PRN Meds:  heparin (porcine), fentanNYL, sodium chloride flush, acetaminophen, oxyCODONE-acetaminophen **OR** oxyCODONE-acetaminophen, ondansetron    Labs:  CBC:   Recent Labs      07/21/15   0414  07/22/15   0522  07/22/15   1305  07/22/15   1745  07/23/15   0424   WBC  13.3*  10.1   --    --   8.8   HGB  9.2*  6.2*  6.6*  7.5*  7.4*   HCT  26.8*  18.0*  19.4*  21.6*  22.8*   MCV  87.9  89.1   --    --   91.9   PLT  111*  100*   --    --   78*     BMP:   Recent Labs      07/21/15   1200  07/22/15    0522  07/23/15   0424   NA  136  136  135*   K  4.5  4.5  4.7   CL  100  98*  102   CO2  PHOS  2.5  3.6  3.0   BUN  16  37*  34*   CREATININE  2.1*  4.4*  4.6*     LIVER PROFILE:   Recent Labs      07/21/15   0414  07/21/15   1200  07/22/15   0522  07/23/15   0424   AST  1005*  785*  575*  434*   ALT  270*  202*  151*  104*   BILIDIR  <  0.2   --   <0.2  <0.2   BILITOT  0.3  0.3  <0.2  0.3   ALKPHOS  52  44  50  56     PT/INR: No results for input(s): PROTIME, INR in the last 72 hours.  APTT:   Recent Labs      07/21/15   0414  07/21/15   0802  07/21/15   1252   APTT  77.3*  95.9*  137.5*     UA:  No results for input(s): NITRITE, COLORU, PHUR, LABCAST, WBCUA, RBCUA, MUCUS, TRICHOMONAS, YEAST, BACTERIA, CLARITYU, SPECGRAV, LEUKOCYTESUR, UROBILINOGEN, BILIRUBINUR, BLOODU, GLUCOSEU, AMORPHOUS in the last 72 hours.    Invalid input(s): KETONESU  No results for input(s): PH, PCO2, PO2 in the last 72 hours.    Cultures:  Blood (2/24): NGTD  Nasal MRSA: Negative    Chest imaging:  Chest imaging was reviewed by me and showed:  No new images    Assessment:   ?? Rhabdomyolysis  ?? Compartment Syndrome  ?? Acute kidney injury  ?? Seizure disorder  ?? Acute blood loss anemia  ?? Thrombocytopenia  ?? Drug Overdose    Plan:    Rhabdomyolysis  - Initial CK level of >300K  - CK levels have improved    Compartment syndrome  - S/p fasciotomy to the RLE and RUE on 2/24  - Per orthopedic and vascular surgery  - Received 3 days of unasyn    Acute kidney injury  - HD per nephrology. Remains anuric    Seizure disorder  - On keppra    Acute blood loss anemia  - May be due to blood loss from dressing change. Receiving 2 unit PRBCs on 2/28      Prophylaxis  DVT- SCDs, no chemoprophylaxis given anemia    Thank you for allowing me to participate in the care of this patient. Will sign off. Please call with any questions or concerns.    Carley Hammed, MD  [facility] Pulmonary, Critical Care and Sleep

## 2015-07-23 NOTE — Pre Sedation (Signed)
Sedation Pre-Procedure Note    Patient Name: Roy Weaver   Date of Birth:03-17-1986  Room/Bed: K1W-1310/1310-01  Medical Record Number: [id number]  Date: 07/23/2015   Time: 10:53 AM       Indication:  Renal failure    Consent: I have discussed with the patient and/or the patient representative the indication, alternatives, and the possible risks and/or complications of the planned procedure and the anesthesia methods. The patient and/or patient representative appear to understand and agree to proceed.    Vital Signs:   Vitals:    07/23/15 0900   BP: (!) 147/69   Pulse: 88   Resp: 15   Temp:    SpO2: 95%       Past Medical History:   has a past medical history of AKI (acute kidney injury) (HCC); Migraine; Psychiatric problem; Seizures (HCC); and Traumatic rhabdomyolysis (HCC).    Past Surgical History:   has no past surgical history on file.    Medications:   Scheduled Meds:   ??? levETIRAcetam  500 mg Oral Q24H   ??? sodium chloride flush  10 mL Intravenous 2 times per day   ??? docusate sodium  100 mg Oral BID     Continuous Infusions:    PRN Meds: heparin (porcine), fentanNYL, sodium chloride flush, acetaminophen, oxyCODONE-acetaminophen **OR** oxyCODONE-acetaminophen, ondansetron  Home Meds:   Prior to Admission medications    Medication Sig Start Date End Date Taking? Authorizing Provider   levETIRAcetam (KEPPRA) 1000 MG tablet Take 1 tablet by mouth 2 times daily 06/27/15  Yes San Jetty, MD     Coumadin Use Last 7 Days:  no  Antiplatelet drug therapy use last 7 days: no  Other anticoagulant use last 7 days: no  Additional Medication Information:        Pre-Sedation Documentation and Exam:   I have reviewed the patient???s history and review of systems.    Mallampati Airway Assessment:  dentition not prohibitive    Prior History of Anesthesia Complications:   none    ASA Classification:  Class 3 ??? A patient with severe systemic disease that limits activity but is not incapacitating    Sedation/ Anesthesia Plan:    intravenous sedation    Medications Planned:   fentanyl and versed intravenously    Patient is an appropriate candidate for plan of sedation: yes    Electronically signed by Myrlene Broker, MD on 07/23/2015 at 10:53 AM

## 2015-07-23 NOTE — Brief Op Note (Signed)
Brief Postoperative Note    Roy Weaver  Date of Birth:  Apr 19, 1986  [id number]    Pre-operative Diagnosis: renal failure    Post-operative Diagnosis: Same    Procedure: [place] and fluoro RIJ tunneled HD catheter placement.    Anesthesia: Moderate Sedation    Surgeons/Assistants: Dr. Radene Ou    Estimated Blood Loss: less than 10ml     Complications: None    Specimens: Was Not Obtained    Findings: RIJ 19cm tip to cuff HD catheter placed with tip in right atrium. Aspirated, flushed and locked with heparin. Okay to use.    Electronically signed by Myrlene Broker, MD on 07/23/2015 at 11:32 AM

## 2015-07-23 NOTE — Other (Addendum)
Patient Acct Nbr:  [id number]  Primary AUTH/CERT:   [id number]   Primary Insurance Company Name:   [employer] HE[employer]/HMO  Primary Insurance Plan Name:  [facility] COMMUNITY PLAN M/CAID HMO  Primary Insurance Group Number:  [id number]  Primary Insurance Plan Type: N  Primary Insurance Policy Number:  [id number]

## 2015-07-23 NOTE — Care Coordination-Inpatient (Signed)
[facility]omy Continence Nurse  Follow-up Progress Note       NAME:  Roy Weaver  MEDICAL RECORD NUMBER:  [id number]  AGE:  30 y.o.   GENDER:  male  DOB:  July 28, 1985  TODAY'S DATE:  07/23/2015    Subjective:   Follow to change right leg surgical wound dressings    NWPT dressing removed.   Surgical wounds remain clean with granulation tissue, exposed fascia, muscle, and ligaments.   Dr. Sherrilyn Rist at bedside to see surgical wounds.   NO active bleeding noted, but there is copious amounts serosanguinous drainage.   ALL 3 wounds bridged together and 2 trac pads used with "Y-connector"   Dressing change difficult due surgical wound locations and patient pain.   Dressing change took 2 WOC nurses and bedside nurse 1 hour and 30 minutes to complete with adequate seal. Leak detector checked with adequate.   Will continue to follow. See wound details below.        Wound Identification:  Wound Type: non-healing surgical  Contributing Factors: chronic pressure, decreased mobility, shear force, anticoagulation therapy, malnutrition, poor hygiene, non-adherence and substance abuse        Patient Goal of Care:   Wound Healing   Odor Control   Palliative Care   Pain Control    Other:     Objective:      Visit Vitals   ??? BP (!) 160/84   ??? Pulse 86   ??? Temp 99.5 ??F (37.5 ??C) (Oral)   ??? Resp 13   ??? Ht  (1.93 m)   ??? Wt 187 lb 13.3 oz (85.2 kg)   ??? SpO2 96%   ??? BMI 22.86 kg/m2     Braden Risk Score: Braden Scale Score: 13 at risk   Assessment:   Measurements:  Negative Pressure Wound Therapy Leg Right;Lateral;Medial;Lower (Active)   Wound Type Surgical 07/23/2015 12:01 PM   Assessment Clean;Drainage;Fragile;Painful;Pink;Red;Swelling;White 07/23/2015 12:01 PM   Peri-wound Assessment Dry;Intact 07/23/2015 12:01 PM   Shape see below  07/21/2015  4:22 PM   Unit Type KCI vac  07/23/2015 12:01 PM   Dressing Type Black foam 07/23/2015 12:01 PM   Number of pieces used 12 07/23/2015 12:01 PM   Cycle  Continuous;On 07/23/2015 12:01 PM   Target Pressure (mmHg) Other (Comment) 07/23/2015 12:01 PM   Canister changed? No 07/23/2015  4:00 AM   Dressing Status Changed 07/23/2015 12:01 PM   Dressing Changed Changed/New 07/23/2015 12:01 PM   Drainage Amount Large 07/23/2015 12:01 PM   Drainage Description Serosanguinous 07/23/2015 12:01 PM   Dressing Change Due 07/25/15 07/23/2015 12:01 PM   Odor None 07/23/2015 12:01 PM   Output (ml) 200 ml 07/23/2015 12:00 PM   Number of days:1       Incision 07/18/15 Leg Right;Lateral (Active)   Assessment UTA 07/23/2015 12:00 PM   Peri-wound Assessment Clean;Dry;Intact 07/23/2015 12:00 PM   Wound Length (cm) 46 cm 07/21/2015  1:42 PM   Wound Width (cm) 14.3 cm 07/21/2015  1:42 PM   Depth (cm)  3 07/21/2015  1:42 PM   Drainage Amount None 07/23/2015  4:00 AM   Drainage Description Serosanguinous;Sanguinous 07/21/2015  1:42 PM   Odor None 07/21/2015  1:42 PM   Dressing/Treatment Vacuum dressing 07/23/2015 12:01 PM   Dressing Changed Changed/New 07/23/2015 12:01 PM   Dressing Status Changed 07/23/2015 12:01 PM   Dressing Change Due 07/25/15 07/23/2015 12:01 PM   Number of days:4       Incision  07/18/15 Arm Right;Lower (Active)   Assessment Clean;Dry;Red;Swelling 07/23/2015  1:28 PM   Peri-wound Assessment Clean;Dry;Intact;Pink 07/23/2015  1:28 PM   Closure Staples 07/23/2015  1:28 PM   Drainage Amount Moderate 07/23/2015  1:28 PM   Drainage Description Serosanguinous 07/23/2015  1:28 PM   Odor None 07/23/2015  1:28 PM   Dressing/Treatment Moist to dry 07/23/2015  1:28 PM   Dressing Changed Changed/New 07/23/2015  1:28 PM   Dressing Status Changed;Clean;Dry;Intact 07/23/2015  1:28 PM   Dressing Change Due 07/24/15 07/23/2015  1:28 PM   Number of days:4       Incision 07/21/15 Leg Right;Lower (Active)   Assessment Painful;UTA 07/23/2015 12:00 PM   Wound Length (cm) 22 cm 07/21/2015  1:42 PM   Wound Width (cm) 6.5 cm 07/21/2015  1:42 PM   Depth (cm)  0.9 07/21/2015  1:42 PM   Drainage Amount Moderate 07/21/2015  1:42 PM   Drainage Description  Serosanguinous 07/21/2015  1:42 PM   Odor None 07/21/2015  1:42 PM   Dressing/Treatment Vacuum dressing 07/23/2015 12:01 PM   Dressing Changed Changed/New 07/23/2015 12:01 PM   Dressing Status Changed 07/23/2015 12:01 PM   Dressing Change Due 07/25/15 07/23/2015 12:01 PM   Number of days:1       Incision 07/21/15 Leg Right;Medial (Active)   Assessment Painful;UTA 07/23/2015 12:00 PM   Peri-wound Assessment Intact 07/23/2015  4:00 AM   Wound Length (cm) 29.5 cm 07/21/2015  1:42 PM   Wound Width (cm) 10 cm 07/21/2015  1:42 PM   Depth (cm)  2.5 07/21/2015  1:42 PM   Drainage Amount Copious 07/21/2015  1:42 PM   Drainage Description Sanguinous 07/21/2015  1:42 PM   Odor None 07/21/2015  1:42 PM   Dressing/Treatment Vacuum dressing 07/23/2015 12:01 PM   Dressing Changed Changed/New 07/23/2015 12:01 PM   Dressing Status Changed 07/23/2015 12:01 PM   Dressing Change Due 07/25/15 07/23/2015 12:01 PM   Number of days:1       Response to treatment:  Poorly tolerated by patient., With complaints of pain.     Pain Assessment:  Severity:  10 / 10  Quality of pain: aching, tender,   Wound Pain Timing/Severity: constant  Premedicated: Yes  Plan:   Plan of Care: Incision 07/18/15 Leg Right;Lateral-Dressing/Treatment: Vacuum dressing  Incision 07/18/15 Arm Right;Lower-Dressing/Treatment: Moist to dry  Incision 07/21/15 Leg Right;Lower-Dressing/Treatment: Vacuum dressing  Incision 07/21/15 Leg Right;Medial-Dressing/Treatment: Vacuum dressing     1. Implement Braden prevention orders. See orders for prevention order details   2. Implement wound care orders. See wound care orders for dressing change instructions   Right leg: NWPT   3. Will continue to follow and change NWPT dressing on Friday       Specialty Bed Required : Yes   []  Low Air Loss   []  Pressure Redistribution  [x]  Fluid Immersion: Dolphin   []  Bariatric  []  Total Pressure Relief  []  Other:     Current Diet: DIET RENAL;  Dietary Nutrition Supplements: Renal Oral Supplement  Dietician consult:  Yes  wound healing     Discharge Plan:  Placement for patient upon discharge: intermediate care facility   Patient appropriate for Outpatient Wound Care Center: Yes    Referrals:  []  Social Worker  []  Home Health Care  []  Supplies  []  Other    Patient/Caregiver Teaching:  Level of patient/caregiver understanding able to: Spoke with patient and bedside nurse Irving Burton about plan of care   []  Indicates understanding       [  x] Needs reinforcement  []  Unsuccessful      []  Verbal Understanding  []  Demonstrated understanding       []  No evidence of learning  []  Refused teaching         []  N/A       Electronically signed by Rebbeca Paul, RN, CWOCN on 07/23/2015 at 1:37 PM

## 2015-07-23 NOTE — Progress Notes (Signed)
VASCULAR  POD#4  ????  Eating. Tolerating VAC  ????  VSS afeb  Palpable R DP pulse  Thigh/calf soft  4/5 dorsiflexion R foot  VAC in place  ????  A/P:   S/P R thigh and calf fasciotomies   Continue VAC with M-W-F changes.   Will arrange for [facility] today by IR.   Begin PT/OT with weight bearing as tolerated.  ????   Dolores Hoose

## 2015-07-23 NOTE — Progress Notes (Signed)
Nephrology Progress Note  [phone]  [phone]   [email or web address]    Patient:  Roy Weaver   DOB: 10-Jan-1986    CC:  Found down    Subjective:  -pt seen and examined  -PMSHx and meds reviewed  -Mother at bedside    Started on CRRT  for hyperkalemia post op and -taken to OR for fasciotomy right forearm 07/19/2015  CRRT stopped 07/21/15  TDC placed in IR this AM  Remains oliguric    ROS:    pain controlled neg chest pain/SOB         Meds:  Current Facility-Administered Medications   Medication Dose Route Frequency Provider Last Rate Last Dose   ??? levETIRAcetam (KEPPRA) tablet 500 mg  500 mg Oral Q24H Markus Jarvis, MD   500 mg at 07/22/15 2345   ??? heparin (porcine) injection 3,000 Units  3,000 Units Intercatheter PRN Earl Gala, MD   3,000 Units at 07/22/15 2308   ??? fentaNYL (SUBLIMAZE) injection 50 mcg  50 mcg Intravenous Q4H PRN Loann Quill, NP   50 mcg at 07/23/15 0910   ??? sodium chloride flush 0.9 % injection 10 mL  10 mL Intravenous 2 times per day Filbert Berthold, MD   10 mL at 07/23/15 0911   ??? sodium chloride flush 0.9 % injection 10 mL  10 mL Intravenous PRN Filbert Berthold, MD       ??? acetaminophen (TYLENOL) tablet 650 mg  650 mg Oral Q4H PRN Filbert Berthold, MD       ??? oxyCODONE-acetaminophen (PERCOCET) 5-325 MG per tablet 1 tablet  1 tablet Oral Q4H PRN Filbert Berthold, MD   1 tablet at 07/22/15 2130    Or   ??? oxyCODONE-acetaminophen (PERCOCET) 5-325 MG per tablet 2 tablet  2 tablet Oral Q4H PRN Filbert Berthold, MD   2 tablet at 07/23/15 0807   ??? docusate sodium (COLACE) capsule 100 mg  100 mg Oral BID Filbert Berthold, MD   100 mg at 07/22/15 2130   ??? ondansetron (ZOFRAN) injection 4 mg  4 mg Intravenous Q6H PRN Filbert Berthold, MD           Vitals:  Visit Vitals   ??? BP 134/66  Comment: LOC = drowsy; c/o procedural discomfort   ??? Pulse 83  Comment: LOC = drowsy; c/o procedural discomfort   ??? Temp 100 ??F (37.8 ??C) (Rectal)   ??? Resp 13  Comment: LOC = drowsy; c/o procedural discomfort   ??? Ht  (1.93  m)   ??? Wt 187 lb 13.3 oz (85.2 kg)   ??? SpO2 97%  Comment: LOC = drowsy; c/o procedural discomfort   ??? BMI 22.86 kg/m2       Physical Exam:  Gen: Resting in bed, NAD.    HEENT: MMM, OP clear.  R IJ TDC in place  CV: RRR no m/r/g.  No S3.  Lungs: CTA-B, no resp distress  Abd: S/NT +BS  Ext: No edema, no cyanosis-dressing to right arm intact, RLE dressing C/D/I  Skin: Warm.  No rashes appreciated.    Labs:  BMP:    Lab Results   Component Value Date    NA 135 07/23/2015    K 4.7 07/23/2015    CL 102 07/23/2015    CO2 23 07/23/2015    BUN 34 07/23/2015    LABALBU 2.1 07/23/2015    CREATININE 4.6 07/23/2015    CREATININE 1.0 08/17/2011  CALCIUM 7.7 07/23/2015    GFRAA 18 07/23/2015    GFRAA 157 08/19/2011    LABGLOM 15 07/23/2015    GLUCOSE 100 07/23/2015       Assessment/Plan:  AKI: due to rhabdomyolysis-initiated on CRRT  due to hyperkalemia CRRT stopped 2/27  -hemodynamically stable  -TDC placed HD tomorrow Minimal UOP will remove Foley    FEN: hyperkalemia: 2/2 rhabdo corrected    Rhabdomyolysis:  With compartment syndrome S/P fasciotomies CK is improving 19 K today    Transamnitis: ischemic injury, improving    Anemia s/p PRBC HGB stable 7.4 gm    compartment syndrome right: s/p fasciotomy  -per orthopedic surgery    Polysubstance abuse        Earl Gala, MD  07/23/2015

## 2015-07-23 NOTE — Plan of Care (Signed)
Problem: Falls - Risk of  Intervention: Fall risk assessment  Morse score = 60  Risks: wound vac on RLE; wound vac cords/wires; PRN pain meds; pain; unfamiliar environment; generalized weakness; CVU monitor wires; bilateral heel protectors;   Intervention: Fall precautions  Bed locked and in low position. Bed alarm on.  Call light, bedside table and personal belongings within reach.  Room uncluttered.  Non-skid footwear used for out of bed.   Falling star light on, fall bracelet on.   Pt educated on fall precautions.         Goal: Absence of falls  Outcome: Ongoing  No falls since admission  Pt, as of today, able to weight bear as tolerated on RLE.    Problem: Risk for Impaired Skin Integrity  Goal: Tissue integrity - skin and mucous membranes  Structural intactness and normal physiological function of skin and  mucous membranes.   Outcome: Ongoing  Skin and mucous membranes as documented  Pt with fasciotomy incisions on RLE (x3) and RUE  Wound vac on RLE incisions      Intervention: SKIN ASSESSMENT  Skin assessment as documented  Intervention: PRESSURE ULCER PREVENTION  Pt on a Dolphin bed  Bilateral heel protectors in place  Turn/reposition q2 hours or more frequently for his comfort      Intervention: TURN PATIENT  Turn/reposition q2 hours or as needed for comfort      Problem: Infection - Central Venous Catheter-Associated Bloodstream Infection:  Goal: Will show no infection signs and symptoms  Will show no infection signs and symptoms   Outcome: Ongoing  No s/s of infection at vas cath site  Pt now with new vas cath site after getting tunneled dialysis catheter placed    Problem: Urinary Elimination:  Goal: Signs and symptoms of infection will decrease  Signs and symptoms of infection will decrease   Outcome: Completed Date Met:  07/23/15  No s/s of urinary infection  Pt with minimal urine output  Goal: Complications related to the disease process, condition or treatment will be avoided or  minimized  Complications related to the disease process, condition or treatment will be avoided or minimized   Outcome: Completed Date Met:  07/23/15    Problem: Pain:  Goal: Pain level will decrease  Pain level will decrease   Outcome: Ongoing  Pt pre-medicated for dressing changes  Received Fentanyl during vac change  Requested pain med after [facility] placement  Goal: Control of acute pain  Control of acute pain   Outcome: Ongoing  Acute pain controlled with PRN pain meds as requested, as able to give  Also use repositioning to help with pain control    Problem: Nutrition  Goal: Optimal nutrition therapy  Outcome: Ongoing  Pt has been NPO this shift until recently  Pt drowsy following TDC placement  PO intake encouraged

## 2015-07-23 NOTE — Care Coordination-Inpatient (Signed)
Negative Pressure    NAME:  Roy Weaver  DATE OF BIRTH:  11/07/1985  MEDICAL RECORD NUMBER:  [id number]  DATE:  07/18/2015    ???  Removed old wound/ulcer Negative pressure therapy dressing if indicated     and cleansed wound gently with normal saline.   ??? Applied Negative Pressure to Right leg surgical wound(s)/ulcer(s).  ???  Applied skin barrier prep to peri-wound.   ???  Cut strips of plastic drape to picture frame wound so that peri-wound is     covered with the drape.   ???  If bridging dressing to less prominent site, cover any intact skin that will come in contact with the Negative Pressure Therapy sponge, gauze or channel drain with plastic drape.  The sponge should never touch intact skin.   ???  Cut sponge, gauze or channel drain to size which will fit into the wound/ulcer bed without being forced.   ???  Be sure the sponge is large enough to hold the entire round plastic flange which is attached to the tubing.  Never allow flange to be larger than the sponge or it will produce suction damaging intact skin.   ???  If bridging the dressing away from the primary site, be sure the bridge leads to a piece of sponge large enough to hold the entire flange without allowing any of the flange to overlap onto intact skin.   ???  Covered sponge, gauze or channel drain with plastic drape.   ???  Cut a hole in this plastic drape directly over the sponge the same size as the plastic drain tubing.   ???  Removed plastic liner from flange and apply it directly over the hole you cut.   ???  Removed the plastic cover from the flange.   ???  Attached the tubing to the wound/ulcer Negative Pressure Therapy and turn it on to be sure a vacuum is created and that there are no leaks.   ???  If air leaks occur, use plastic drape to patch them.   ???  Secured Negative Pressure Therapy dressing with ace wrap loosely if located on an extremity. Maintain tubing outside of ace wrap. Tubing must not exert pressure on  intact skin.    Applied per Manufacturer Guidelines      Electronically signed by Rebbeca Paul, RN on 07/23/2015 at 2:57 PM

## 2015-07-23 NOTE — Care Coordination-Inpatient (Signed)
Nursing stated that patient's mother wants [facility] ltac over [employer].  Contacted patient's mother and confirmed that she prefers [facility].  Met with patient and again discussed ltac.  Patient is in agreement now with [facility].  Referral made to [facility].  [facility] requested to withdraw their ltac precert.  [facility] will initiate insurance precert today.  Will continue to follow to assist with discharge.  Candiss Norse Porshe Fleagle RN, 07/23/2015, 10:49 AM. [phone]

## 2015-07-23 NOTE — Progress Notes (Signed)
2000: HD in progress at bedside. Assessment completed. A/O x4 VSS on RA. Drowsy at this time. SR on monitor. Poor appetite. Denies needs.  2130: PRN Percocet given for 10/10 pain  2330: Wound vac alarming high leak- tegaderm applied to all wounds for vacuum seal. Vac draining large amount of serosanguinous fluid, but continues to alarm high leak every few minutes. Call to placed to supervisor for new vac from SPD.   0000: Assessment unchanged. Wound vac continues to drain and alarm despite multiple reinforcements.   0030: Wound vac and canister changed.  0100: Leak alarm persists with small drainage on new vac. Call to Florentina Addison, NP- coming to assess.  0130: Vac suctioned reduced to 100 mmHg, leak alarm continues. Placed on sleep mode.  6: Page to hospitalist NP to see if still coming to evaluate wound vac.  0325: Transferred to CVU 1310. Report given to Marian Sorrow, Charity fundraiser. Transported in stable condition with all belongings by 2 RNs.

## 2015-07-24 LAB — BASIC METABOLIC PANEL
Anion Gap: 13 (ref 3–16)
BUN: 61 mg/dL — ABNORMAL HIGH (ref 7–20)
CO2: 20 mmol/L — ABNORMAL LOW (ref 21–32)
Calcium: 7.9 mg/dL — ABNORMAL LOW (ref 8.3–10.6)
Chloride: 100 mmol/L (ref 99–110)
Creatinine: 8 mg/dL (ref 0.9–1.3)
GFR African American: 10 — AB (ref >60)
GFR Non-African American: 8 — AB (ref >60)
Glucose: 105 mg/dL — ABNORMAL HIGH (ref 70–99)
Potassium: 5.7 mmol/L — ABNORMAL HIGH (ref 3.5–5.1)
Sodium: 133 mmol/L — ABNORMAL LOW (ref 136–145)

## 2015-07-24 LAB — MAGNESIUM: Magnesium: 2.6 mg/dL — ABNORMAL HIGH (ref 1.80–2.40)

## 2015-07-24 LAB — CBC WITH AUTO DIFFERENTIAL
Basophils %: 0.1 %
Basophils Absolute: 0 10*3/uL (ref 0.0–0.2)
Eosinophils %: 2 %
Eosinophils Absolute: 0.2 10*3/uL (ref 0.0–0.6)
Hematocrit: 26 % — ABNORMAL LOW (ref 40.5–52.5)
Hemoglobin: 8.6 g/dL — ABNORMAL LOW (ref 13.5–17.5)
Lymphocytes %: 12 %
Lymphocytes Absolute: 1.1 10*3/uL (ref 1.0–5.1)
MCH: 30.1 pg (ref 26.0–34.0)
MCHC: 32.9 g/dL (ref 31.0–36.0)
MCV: 91.5 fL (ref 80.0–100.0)
MPV: 8.1 fL (ref 5.0–10.5)
Monocytes %: 11.3 %
Monocytes Absolute: 1.1 10*3/uL (ref 0.0–1.3)
Neutrophils %: 74.6 %
Neutrophils Absolute: 7 10*3/uL (ref 1.7–7.7)
Platelets: 95 10*3/uL — ABNORMAL LOW (ref 135–450)
RBC: 2.85 M/uL — ABNORMAL LOW (ref 4.20–5.90)
RDW: 14.5 % (ref 12.4–15.4)
WBC: 9.4 10*3/uL (ref 4.0–11.0)

## 2015-07-24 LAB — HEPATIC FUNCTION PANEL
ALT: 83 U/L — ABNORMAL HIGH (ref 10–40)
AST: 332 U/L — ABNORMAL HIGH (ref 15–37)
Albumin: 2.2 g/dL — ABNORMAL LOW (ref 3.4–5.0)
Alkaline Phosphatase: 55 U/L (ref 40–129)
Bilirubin, Direct: <0.2 mg/dL (ref 0.0–0.3)
Total Bilirubin: 0.3 mg/dL (ref 0.0–1.0)
Total Protein: 4.8 g/dL — ABNORMAL LOW (ref 6.4–8.2)

## 2015-07-24 LAB — PHOSPHORUS: Phosphorus: 4.5 mg/dL (ref 2.5–4.9)

## 2015-07-24 LAB — POCT GLUCOSE
POC Glucose: 98 mg/dl (ref 70–99)
POC Glucose: 143 mg/dl — ABNORMAL HIGH (ref 70–99)

## 2015-07-24 LAB — CK: Total CK: 14415 U/L — ABNORMAL HIGH (ref 39–308)

## 2015-07-24 MED ORDER — OXYCODONE-ACETAMINOPHEN 5-325 MG PO TABS
5-325 MG | ORAL_TABLET | ORAL | 0 refills | Status: DC | PRN
Start: 2015-07-24 — End: 2015-07-28

## 2015-07-24 MED ORDER — LEVETIRACETAM 500 MG PO TABS
500 MG | ORAL_TABLET | ORAL | 3 refills | Status: DC
Start: 2015-07-24 — End: 2015-10-13

## 2015-07-24 MED ORDER — DSS 100 MG PO CAPS
100 MG | ORAL_CAPSULE | Freq: Two times a day (BID) | ORAL | 0 refills | Status: DC
Start: 2015-07-24 — End: 2016-03-14

## 2015-07-24 MED ORDER — SODIUM CHLORIDE 0.9 % IV SOLN
0.9 % | INTRAVENOUS | Status: AC
Start: 2015-07-24 — End: 2015-07-24
  Administered 2015-07-24: 17:00:00 100

## 2015-07-24 MED FILL — LEVETIRACETAM 500 MG PO TABS: 500 MG | ORAL | Qty: 1

## 2015-07-24 MED FILL — OXYCODONE-ACETAMINOPHEN 5-325 MG PO TABS: 5-325 MG | ORAL | Qty: 2

## 2015-07-24 MED FILL — DOK 100 MG PO CAPS: 100 MG | ORAL | Qty: 1

## 2015-07-24 MED FILL — NORMAL SALINE FLUSH 0.9 % IV SOLN: 0.9 % | INTRAVENOUS | Qty: 10

## 2015-07-24 MED FILL — SODIUM CHLORIDE 0.9 % IV SOLN: 0.9 % | INTRAVENOUS | Qty: 2000

## 2015-07-24 NOTE — Plan of Care (Signed)
Problem: Falls - Risk of  Intervention: Fall risk assessment  Morse Fall score=60  Intervention: Fall precautions  Bed in low and locked position with alarm activated.  Call light within reach, uses appropriately for assistance.    Goal: Absence of falls  Outcome: Met This Shift  Pt free of falls during this shift.  Fall risk assessment completed and protocol in place.  Q1h visual checks completed.  Will monitor.    Problem: Infection - Central Venous Catheter-Associated Bloodstream Infection:  Goal: Will show no infection signs and symptoms  Will show no infection signs and symptoms   Outcome: Ongoing    Problem: Pain:  Goal: Pain level will decrease  Pain level will decrease   Outcome: Ongoing  Pt has had no c/o pain thus far through the shift.  Pt alert and oriented, able to recognize and verbalize pain appropriately.  Will monitor.  Goal: Control of acute pain  Control of acute pain   Outcome: Ongoing  PRN medications available for pain if needed.  Pt repositioned frequently to assist with pain.  Will monitor.

## 2015-07-24 NOTE — Progress Notes (Signed)
Occupational Therapy  Facility/Department: [facility]  Daily Treatment Note  NAME: Roy Weaver  DOB: 21-Aug-1985  MRN: [id number]     Patient with diagnoses of Heroin Overdose, Traumatic Rhabdomyolysis, R UE/LE Compartment Syndromes, AKI  on CRRT,  07/18/15 R Forearm Fasciotomy of Flexor Compartment and R Thigh/Calf Fasciotomies, Acute toxic encephalopathy demonstrating decreased functional status and is indicated for skilled OT services. Will continue to assess for discharge planning. Recommend continued OT in LTAC setting  and eventual advancement to a SNF vs Inpatient Rehab Program.    Date of Service: 07/24/2015    Patient Diagnosis(es):   Patient Active Problem List    Diagnosis Date Noted   ??? Acute blood loss anemia    ??? Traumatic rhabdomyolysis (HCC)    ??? Seizure disorder (HCC)    ??? Acute respiratory failure with hypoxia (HCC) 07/19/2015   ??? Non-traumatic rhabdomyolysis    ??? AKI (acute kidney injury) (HCC)    ??? Traumatic compartment syndrome (HCC)    ??? 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 Post-ictal state (HCC) 07/12/2015   ??? Breakthrough seizure (HCC)    ??? Visit for suture removal 03/25/2015   ??? Seizure (HCC) 04/10/2014   ??? Aspiration pneumonia of right lung (HCC) 03/30/2014   ??? 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 Seizures (HCC) 02/14/2014   ??? Partial symptomatic epilepsy with complex partial seizures, intractable, without status epilepticus (HCC)    ??? Noncompliance with medication regimen    ??? Partial epilepsy with impairment of consciousness (HCC) 11/07/2013   ??? Delirium    ??? Drug use    ??? Recurrent seizures (HCC) 09/14/2013   ??? History of nonadherence to medical treatment 09/14/2013   ??? Migraine 05/03/2013   ??? Seizures (HCC) 04/09/2013       Restrictions  Restrictions/Precautions  Restrictions/Precautions: Fall Risk, Weight Bearing  Lower Extremity Weight Bearing Restrictions  Right Lower Extremity Weight Bearing: Weight Bearing  As Tolerated  Upper Extremity Weight Bearing Restrictions  Right Upper Extremity Weight Bearing: Non Weight Bearing  Position Activity Restriction  Other position/activity restrictions:   Fasciotomy R Forearm, R Thigh, and R Calf regions.  Heroin Abuse.  Dr. Yolanda Manges at bedside, advised that pt is WBAT R LE.  (Need to check with Dr. Su Hoff to clarify if able to wgt bear R UE, currently NWB order).   Subjective   General  Chart Reviewed: Yes  Patient assessed for rehabilitation services?: Yes  Additional Pertinent Hx:  Seizure, Aspirative Pneumonia, Delirium, Migraine, AKI, Active Smoker ~ 1 PPD  Referring Practitioner: Filbert Berthold, MD  Diagnosis: Heroin Overdose, Traumatic Rhabdomyolysis, R UE/LE Compartment Syndromes, AKI  on CRRT,  07/18/15 R Forearm Fasciotomy of Flexor Compartment and R Thigh/Calf Fasciotomies, Acute toxic encephalopathy  Pain Assessment  Patient Currently in Pain: Yes  Pain Assessment: 0-10   Pain Level: 4  Pain Type: Acute pain  Pain Location: Arm;Leg  Pain Orientation: Right  Orientation  Within Functional Limits     Objective    ADL  Feeding: Setup;Minimal assistance  Grooming: Dependent/Total  UE Bathing: Dependent/Total  LE Bathing: Dependent/Total  UE Dressing: Dependent/Total  LE Dressing: Dependent/Total  Toileting: Dependent/Total     Standing Balance  Sit to stand: Unable to assess  Stand to sit: Unable to assess  Bed mobility  Rolling to Left: Dependent/Total  Rolling to Right: Dependent/Total  Supine to Sit: Unable to assess  Sit to Supine: Unable to  assess  Transfers  Stand Step Transfers: Unable to assess  Sit to stand: Unable to assess  Stand to sit: Unable to assess  Transfer Comments: Not assess due to medical status      Cognition  Overall Cognitive Status: WFL      LUE PROM (degrees)  LUE PROM: WFL  RUE PROM (degrees)  RUE General PROM: Limited due to discomfort associated with faciotomy  Right Hand PROM (degrees)  Right Hand PROM: WFL  Right Hand AROM (degrees)  Right Hand  General AROM: Limited Actively to approximately 50% closure, marked swelling dorsum of hand      Assessment   Performance deficits / Impairments: Decreased functional mobility ;Decreased ADL status;Decreased ROM;Decreased strength;Decreased endurance;Decreased balance  Assessment: Patient with diagnoses of Heroin Overdose, Traumatic Rhabdomyolysis, R UE/LE Compartment Syndromes, AKI  on CRRT,  07/18/15 R Forearm Fasciotomy of Flexor Compartment and R Thigh/Calf Fasciotomies, Acute toxic encephalopathy demonstrating decreased functional status and is indicated for skilled OT services  Treatment Diagnosis: Weakness, Decreased ADLs and Mobility and Decreased functional use of RUE  Prognosis: Fair  Patient Education: Review of exercises to be done by patient for BUEs  Discharge Recommendations: Continue to assess pending progress;Patient would benefit from continued therapy after discharge (Will continue to assess for discharge planning. Recommend continued OT in LTAC setting  and eventual advancement to a SNF vs Inpatient Rehab Program..)  REQUIRES OT FOLLOW UP: Yes  Activity Tolerance  Activity Tolerance: Treatment limited secondary to medical complications (free text);Patient limited by pain;Patient limited by fatigue  Activity Tolerance: On Room Chiropractor Devices in place: Yes  Type of devices: Call light within reach;Bed alarm in place;Left in bed;Nurse notified  Restraints  Initially in place: No         Discharge Recommendations:  Continue to assess pending progress, Patient would benefit from continued therapy after discharge (Will continue to assess for discharge planning. Recommend continued OT in LTAC setting  and eventual advancement to a SNF vs Inpatient Rehab Program..)     Plan   Plan  Times per week: 3 to 5 times per week while in hospital  Specific instructions for Next Treatment: CoTx, WBAT RLE, NWB RUE, Wound Vac RLE, Faciotomies RUE / RLE, Strict I/O, CRRT with Right IJ  VasCath  Current Treatment Recommendations: Strengthening, ROM, Functional Mobility Training, Teaching laboratory technician, Patent attorney, Equities trader, Self-Care / ADL  Plan Comment: See last progress note for discharge status    G-Code  OT G-codes  Functional Limitation: Self care  Self Care Current Status [phone]): At least 80 percent but less than 100 percent impaired, limited or restricted  Self Care Goal Status ([id number]): At least 60 percent but less than 80 percent impaired, limited or restricted    Goals  Short term goals  Time Frame for Short term goals: 2 Weeks  All goals ongoing  Short term goal 1: Mod A of 2 for basic stand pivot transfer  Short term goal 2: Patient tolerate sitting EOB 4 to 6 minutes with assist of 1-2  Short term goal 3: Patient be able to feed self 25% of meal after set up  Short term goal 4: Patient tolerate Ther Ex Program to RUE, per physician guidelines  Long term goals  Time Frame for Long term goals : TBD  Patient Goals   Patient goals : Be able to return home       Therapy Time   Individual Concurrent Group  Co-treatment   Time In 1615         Time Out 1700         Minutes [address] [place], OTR/L  [place] [id number]

## 2015-07-24 NOTE — Progress Notes (Signed)
VASCULAR  POD#5  ????  Eating. Tolerating VAC. THDC placed yesterday.  ????  VSS afeb  Palpable R DP pulse  Thigh/calf soft  4/5 dorsiflexion R foot  VAC in place  ????  A/P:   S/P R thigh and calf fasciotomies   Continue VAC with M-W-F changes.   Will remove temporary line left in place yesterday.   Continue PT/OT with weight bearing as tolerated.   OK for LTAC transfer prn.  ????   Roy HooseGreg Herberta Weaver

## 2015-07-24 NOTE — Progress Notes (Signed)
Physical Therapy  Facility/Department: [facility]  Daily Treatment Note  NAME: Roy Weaver  DOB: [birth year]  MRN: [id number]    Date of Service: 07/24/2015    Patient Diagnosis(es):   Patient Active Problem List    Diagnosis Date Noted   ??? Acute blood loss anemia    ??? Traumatic rhabdomyolysis (HCC)    ??? Seizure disorder (HCC)    ??? Acute respiratory failure with hypoxia (HCC) 07/19/2015   ??? Non-traumatic rhabdomyolysis    ??? AKI (acute kidney injury) (HCC)    ??? Traumatic compartment syndrome (HCC)    ??? 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 Post-ictal state (HCC) 07/12/2015   ??? Breakthrough seizure (HCC)    ??? Visit for suture removal 03/25/2015   ??? Seizure (HCC) 04/10/2014   ??? Aspiration pneumonia of right lung (HCC) 03/30/2014   ??? 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 Seizures (HCC) 02/14/2014   ??? Partial symptomatic epilepsy with complex partial seizures, intractable, without status epilepticus (HCC)    ??? Noncompliance with medication regimen    ??? Partial epilepsy with impairment of consciousness (HCC) 11/07/2013   ??? Delirium    ??? Drug use    ??? Recurrent seizures (HCC) 09/14/2013   ??? History of nonadherence to medical treatment 09/14/2013   ??? Migraine 05/03/2013   ??? Seizures (HCC) 04/09/2013       Past Medical History   Diagnosis Date   ??? AKI (acute kidney injury) (HCC)    ??? Hepatitis C 07/23/2015   ??? Heroin abuse    ??? Migraine    ??? Psychiatric problem    ??? Seizures (HCC)    ??? Traumatic rhabdomyolysis (HCC)      History reviewed. No pertinent past surgical history.    Restrictions  Restrictions/Precautions  Restrictions/Precautions: Fall Risk, Weight Bearing  Lower Extremity Weight Bearing Restrictions  Right Lower Extremity Weight Bearing: Weight Bearing As Tolerated  Upper Extremity Weight Bearing Restrictions  Right Upper Extremity Weight Bearing: Non Weight Bearing  Position Activity Restriction  Other position/activity restrictions:    Fasciotomy R Forearm, R Thigh, and R Calf regions.  Heroin Abuse.   Spoke with Dr. Yolanda MangesZenni at bedside, advised PT that pt is WBAT R LE.  (Need to check with Dr. Su HoffArebi to clarify if able to wgt bear R UE, currently NWB order).   Subjective   General  Chart Reviewed: Yes  Additional Pertinent Hx: 30 y/o male admit 07/18/15 with Heroin Overdose, Traumatic Rhabdo (down at least 12 hr, found bathroom floor), R UE/LE Compartment Syndromes, AKI.      07/18/15 S/P R Forearm Fasciotomy of Flexor Compartment and R Thigh/Calf Fasciotomies.  PMH as noted including Heroin Abuse.   Response To Previous Treatment: Patient with no complaints from previous session.  Family / Caregiver Present: No  Referring Practitioner: Dr. Su HoffArebi., Dr. Yolanda MangesZenni.  Subjective  Subjective: Pt agreeable to PT Rx.   Pain Screening  Patient Currently in Pain: Yes  Pain Assessment  Pain Type: Surgical pain  Pain Location: Arm;Leg  Pain Orientation: Right  Vital Signs  Patient Currently in Pain: Yes       Orientation  Orientation  Overall Orientation Status: Within Functional Limits  Objective      Transfers  Bed to Chair: Unable to assess           Exercises  Comments: Initiated AROM Exs B UEs,  L LE.  Isometrics R LE,  Ankle Pumps.                         Assessment   Body structures, Functions, Activity limitations: Decreased functional mobility ;Decreased ROM;Decreased strength;Decreased endurance  Assessment: 30 y/o male admit 07/18/15 with Heroin Overdose, Traumatic Rhabdo (down at least 12 hr), AKI and R UE/LE Compartment Syndromes.  S/P R UE/LE Fasciotomies 07/18/15, currently undergoing CRRT for AKI.  PTA pt living with parent, 2 story house with steps to enter/2nd floor bed/bath.  Per SW, anticipate d/c LTAC setting with cont PT.   Prognosis: Fair  Patient Education: Role of PT, POC  REQUIRES PT FOLLOW UP: Yes  Activity Tolerance  Activity Tolerance: Patient limited by pain  Activity Tolerance: Deferred EOB, OOB attempt activities due to upcoming HD at  bedside this AM.    PT Equipment Recommendations  Other: Will monitor for potential equipt needs.        Discharge Recommendations:  Continue to assess pending progress    G-Code       Goals  Short term goals  Time Frame for Short term goals: Upon d/c acute care setting.   Short term goal 1: Pt participating in approp ROM/Strength Exs.   Short term goal 2: Bed Mob Mod x 2.   Short term goal 3: Transfers with assist device/lift equipt Mod/Max x 2.   Patient Goals   Patient goals : Return home.     Plan    Plan  Times per week: 3-5x week as approp while in acute care setting.   Current Treatment Recommendations: Strengthening, ROM, Building services engineer, Teacher, early years/pre, Patent attorney, Patient/Caregiver Education & Training  Safety Devices  Type of devices: Bed alarm in place, Call light within reach, Left in bed, Nurse notified     Therapy Time   Individual Concurrent Group Co-treatment   Time In 0915         Time Out 0945         Minutes 30                 Dorena Bodo, PT

## 2015-07-24 NOTE — Progress Notes (Signed)
Hospitalist ICU Progress Note    CC: Non-traumatic rhabdomyolysis    Hosp course: 29yo AAM with hx of siezures and drug abuse. Found down at home, likely overdose of heroin - was down for about 12 hours, then had severe rhabdo and severe compartment syndrome of R side - has fasciotomies of R leg and R arm. Pt CPK was initially 270K. Also had shock liver. CRRT started on 2/25. Has R IJ vascath. Will have TDC placed for iHD Has 3 incisions on legs. CPK trending down.      Admit date: 07/18/2015  Days in hospital:  6    24 Hour Events: [facility] catheter placed     Subjective: Doing ok.  Affect remains flat but answering questions appropriately.  Updated patient on plan of care.     ROS:  A comprehensive review of systems was negative except for: difficult ROS due to failure to make eye contact and not talking  .    Objective:    Visit Vitals   ??? BP 135/73   ??? Pulse 84   ??? Temp 99.1 ??F (37.3 ??C) (Oral)   ??? Resp 12   ??? Ht  (1.93 m)   ??? Wt 182 lb 5.1 oz (82.7 kg)   ??? SpO2 94%   ??? BMI 22.19 kg/m2       Gen: NAD  HEENT: NC/AT, moist mucous membranes, no oropharyngeal erythema or exudate    Neck: supple, trachea midline, no anterior cervical or SC LAD  Heart:  Normal s1/s2, RRR, no murmurs, gallops, or rubs. R leg edema  Lungs:  diminished bilaterally, no wheeze, no rales, no rhonchi, no crackles, no use of accessory muscles  Abd: bowel sounds present, soft, nontender, nondistended, no masses  Extrem:  No clubbing, cyanosis,  R leg edema, peripheral pulses 2+, brisk capillary refill  Skin: no rashes or lesions, normal color/perfusion  Psych:  A & O x3    Neuro: grossly intact, moves all four extremities.        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  Acute rhabdo - improving with HD  2.  Acute renal failure - dialysis dependent for now - [facility] placed. Possible Tues-thurs- sat schedule  3.  Compartment syndrome of R arm and R leg - wound vac placed to leg - MWF changes  4.  Acute blood loss anemia - received 1u pRBC for hgb drop - blood loss likely from wound vac - stable  5.  Opiate abuse - pt snorts heroin apparently - on prn pi meds for wound care and pain        Prognosis:  Fair    Code status:  FULL  DVT prophylaxis:  Lovenox   SQ Heparin   SCDs because wound bleeding  warfarin/oral direct thrombin inhibitor  Encourage ambulation    GI prophylaxis:  PPI/H2blocker   not indicated    Disposition  transfer to monitored  bed    Medications:  Scheduled Meds:  ??? levETIRAcetam  500 mg Oral Q24H   ??? sodium chloride flush  10 mL Intravenous 2 times per day   ??? docusate sodium  100 mg Oral BID       PRN Meds:  heparin (porcine), fentanNYL, sodium chloride flush, acetaminophen, oxyCODONE-acetaminophen **OR** oxyCODONE-acetaminophen, ondansetron    IV:         Intake/Output Summary (Last 24 hours) at 07/24/15 0806  Last data filed at 07/24/15 0430   Gross per 24 hour   Intake             1810 ml   Output             1130 ml   Net              680 ml       Results:  CBC:   Recent Labs      07/22/15   0522   07/22/15   1745  07/23/15   0424  07/24/15   0428   WBC  10.1   --    --   8.8  9.4   HGB  6.2*   < >  7.5*  7.4*  8.6*   HCT  18.0*   < >  21.6*  22.8*  26.0*   MCV  89.1   --    --   91.9  91.5   PLT  100*   --    --   78*  95*    < > = values in this interval not displayed.     BMP:   Recent Labs      07/22/15   0522  07/23/15   0424  07/24/15   0428   NA  136  135*  133*   K  4.5  4.7  5.7*   CL  98*  102   100   CO2  26  23  20*   PHOS  3.6  3.0  4.5   BUN  37*  34*  61*   CREATININE  4.4*  4.6*  8.0*     Mag: No results for input(s): MAG in the last 72 hours.  LIVER PROFILE:   Recent Labs      07/22/15   0522  07/23/15   0424  07/24/15   0428   AST  575*  434*  332*   ALT  151*  104*  83*   BILIDIR  <0.2  <0.2  <0.2   BILITOT  <0.2  0.3  0.3   ALKPHOS  50  56  55     PT/INR:   Recent Labs      07/23/15   0900   PROTIME  9.7   INR  0.86     APTT:   Recent Labs      07/21/15   1252   APTT  137.5*     UA:No results for input(s): NITRITE, COLORU, PHUR, LABCAST, WBCUA, RBCUA, MUCUS, TRICHOMONAS, YEAST, BACTERIA, CLARITYU, SPECGRAV, LEUKOCYTESUR, UROBILINOGEN, BILIRUBINUR, BLOODU, GLUCOSEU, AMORPHOUS in the last 72 hours.    Invalid input(s): KETONESU    ABG:   Lab Results   Component Value Date    PHART 7.339 07/19/2015    PCO2ART 38.3 07/19/2015    PO2ART 238.0 07/19/2015    HCO3ART 20.1 07/19/2015    BEART -4.8 07/19/2015    TCO2ART 21.2 07/19/2015    O2SATART 99.6 07/19/2015  Lab Results   Component Value Date    CALCIUM 7.9 (L) 07/24/2015    PHOS 4.5 07/24/2015             Electronically signed by Despina HiddenSRIKANTH Tamerra Merkley, MD on 07/24/2015 at 8:06 AM

## 2015-07-24 NOTE — Plan of Care (Signed)
Problem: Falls - Risk of  Goal: Absence of falls  Outcome: Ongoing  Pt remains free of falls. Fall risk protocol in place. Bed locked in lowest position. Call light in reach. Pt instructed to call for assistance, verbalizes understanding. Will continue to monitor.        Problem: Pain:  Goal: Pain level will decrease  Pain level will decrease   Outcome: Ongoing  Pt alert and oriented. Pt able to communicate present pain and use the pain scale appropriately. Nonpharmacological pain reducers and pain medication offered as needed. Will cont to monitor.        Problem: Nutrition  Goal: Optimal nutrition therapy  Outcome: Ongoing  Will encourage food and fluids. Has supplement and will encourage this.

## 2015-07-24 NOTE — Other (Signed)
Tolerated tx. well.  Net UF 0.5L x 3.45 hrs.  Right TDC working well.  First day use today of TDC.   Copy of runsheets to chart for EMR scanning.         07/24/15 1149 07/24/15 1548   Vital Signs   BP 137/79 131/73   Weight 176 lb 5.9 oz (80 kg) 175 lb 4.3 oz (79.5 kg)   Post-Hemodialysis Assessment   Total Liters Processed (l/min) --  69 l/min   Hemodialysis Intake (ml) --  700 ml   Hemodialysis Output (ml) --  1200 ml   NET Removed (ml) --  500 ml

## 2015-07-24 NOTE — Progress Notes (Signed)
Nephrology Progress Note  [phone]  [phone]   [email or web address]    Patient:  Roy Weaver   DOB: 1985/09/08    CC:  AKI    Subjective:  Pt remains in the CVICU.  HD later today.    ROS:   Complaints of right leg pain.  No chest pain.    SHx:  No visitors this morning.    Meds:  Current Facility-Administered Medications   Medication Dose Route Frequency Provider Last Rate Last Dose   ??? levETIRAcetam (KEPPRA) tablet 500 mg  500 mg Oral Q24H Markus Jarvis, MD   500 mg at 07/23/15 2257   ??? heparin (porcine) injection 3,000 Units  3,000 Units Intercatheter PRN Earl Gala, MD   3,000 Units at 07/22/15 2308   ??? fentaNYL (SUBLIMAZE) injection 50 mcg  50 mcg Intravenous Q4H PRN Loann Quill, NP   50 mcg at 07/23/15 0910   ??? sodium chloride flush 0.9 % injection 10 mL  10 mL Intravenous 2 times per day Filbert Berthold, MD   10 mL at 07/24/15 0857   ??? sodium chloride flush 0.9 % injection 10 mL  10 mL Intravenous PRN Filbert Berthold, MD       ??? acetaminophen (TYLENOL) tablet 650 mg  650 mg Oral Q4H PRN Filbert Berthold, MD       ??? oxyCODONE-acetaminophen (PERCOCET) 5-325 MG per tablet 1 tablet  1 tablet Oral Q4H PRN Filbert Berthold, MD   1 tablet at 07/22/15 2130    Or   ??? oxyCODONE-acetaminophen (PERCOCET) 5-325 MG per tablet 2 tablet  2 tablet Oral Q4H PRN Filbert Berthold, MD   2 tablet at 07/24/15 0055   ??? docusate sodium (COLACE) capsule 100 mg  100 mg Oral BID Filbert Berthold, MD   100 mg at 07/24/15 0858   ??? ondansetron (ZOFRAN) injection 4 mg  4 mg Intravenous Q6H PRN Filbert Berthold, MD           Vitals:  Visit Vitals   ??? BP 124/81   ??? Pulse 90   ??? Temp 99.1 ??F (37.3 ??C) (Oral)   ??? Resp 14   ??? Ht  (1.93 m)   ??? Wt 182 lb 5.1 oz (82.7 kg)   ??? SpO2 99%   ??? BMI 22.19 kg/m2       Physical Exam:  Gen: Resting in bed, NAD.  In CVICU.  HEENT: MMM, OP clear.  CV: RRR no m/r/g.  No S3.  Lungs: CTA-B  Abd: S/NT +BS  Ext: RLE fasciotomy with woundVAC.  Skin: Warm.  No rashes appreciated.  Access: Right IJ tunneled HD  catheter c/d/i.    Labs:  CBC:   Lab Results   Component Value Date    WBC 9.4 07/24/2015    RBC 2.85 07/24/2015    HGB 8.6 07/24/2015    HCT 26.0 07/24/2015    MCV 91.5 07/24/2015    MCH 30.1 07/24/2015    MCHC 32.9 07/24/2015    RDW 14.5 07/24/2015    PLT 95 07/24/2015    MPV 8.1 07/24/2015     BMP:    Lab Results   Component Value Date    NA 133 07/24/2015    K 5.7 07/24/2015    CL 100 07/24/2015    CO2 20 07/24/2015    BUN 61 07/24/2015    LABALBU 2.2 07/24/2015    CREATININE 8.0 07/24/2015    CREATININE 1.0 08/17/2011  CALCIUM 7.9 07/24/2015    GFRAA 10 07/24/2015    GFRAA 157 08/19/2011    LABGLOM 8 07/24/2015    GLUCOSE 105 07/24/2015       Assessment/Plan:    AKI: due to rhabdomyolysis-initiated on CRRT due to hyperkalemia CRRT stopped 2/27  -hemodynamically stable  -TDC placed, plan for HD today, continue Tues/Thurs/Sat  ??  FEN: hyperkalemia: 2/2 rhabdo, HD today.    Rhabdomyolysis: With compartment syndrome S/P fasciotomies CK is improving.  ??  Transamnitis: ischemic injury, improving  ??  Anemia s/p PRBC HGB up to 8.6.  ??  compartment syndrome right: s/p fasciotomy  -per orthopedic surgery  ??  Polysubstance abuse  ??  Corky Downs, MD, FASN, FACP, RPVI  07/24/2015

## 2015-07-24 NOTE — Progress Notes (Signed)
2045-shift assessment as noted.  Pt updated on POC for night shift and all questions answered.  Foley catheter discontinued.  Peri care performed.  PM medications administered.  IV dressing reinforced.  Pt turned and repositioned.  2300-reassessment as noted.  Pt repositioned.  Wound vac leak alarming, dressing reinforced.    0055-Pt c/o R leg pain, rating 10/10.  PRN Percocet administered.  0125-Pain reassessment completed, pt asleep with respirations >10bpm.  0200-Pt turned and repositioned.  0224-Wound vac drainage cannister changed.  0243-Wound vac leak alarming, dressing reinforced.  0430-reassessment as noted.  Pt bathed and full linen change completed.  R forearm dressing change completed.  Pt turned and repositioned.  Daily weight obtained.    0600-Pt in bed with eyes closed.  No signs of distress noted.  Will monitor.  0730-End of shift bedside handoff completed with oncoming RN, Gerre Sculleri N.    Electronically signed by Dannielle HuhAshley Christine Leonia Heatherly, RN on 07/24/2015 at 7:35 AM

## 2015-07-24 NOTE — Care Coordination-Inpatient (Signed)
[facility] [place] [facility].   [facility] on [facility]. Per Edwena BundeGwen, [facility] requested a fax number from insurance to send updated clinicals. Fax number never provided.   LS[facility] provided [facility] contact person Toniann Fail([phone]- [phone]; [phone] fax)  Requested Dedra SkeensGwen keep LSW updated regarding precert.     Electronically signed by Henderson NewcomerMargaret A Walda Hertzog, MSW, LSW on 07/24/2015 at 3:42 PM

## 2015-07-25 LAB — BASIC METABOLIC PANEL
Anion Gap: 12 (ref 3–16)
BUN: 50 mg/dL — ABNORMAL HIGH (ref 7–20)
CO2: 22 mmol/L (ref 21–32)
Calcium: 7.8 mg/dL — ABNORMAL LOW (ref 8.3–10.6)
Chloride: 99 mmol/L (ref 99–110)
Creatinine: 6.7 mg/dL (ref 0.9–1.3)
GFR African American: 12 — AB (ref >60)
GFR Non-African American: 10 — AB (ref >60)
Glucose: 107 mg/dL — ABNORMAL HIGH (ref 70–99)
Potassium: 5.4 mmol/L — ABNORMAL HIGH (ref 3.5–5.1)
Sodium: 133 mmol/L — ABNORMAL LOW (ref 136–145)

## 2015-07-25 LAB — CBC WITH AUTO DIFFERENTIAL
Basophils %: 0.4 %
Basophils Absolute: 0 10*3/uL (ref 0.0–0.2)
Eosinophils %: 3.1 %
Eosinophils Absolute: 0.3 10*3/uL (ref 0.0–0.6)
Hematocrit: 24.6 % — ABNORMAL LOW (ref 40.5–52.5)
Hemoglobin: 8.2 g/dL — ABNORMAL LOW (ref 13.5–17.5)
Lymphocytes %: 13.5 %
Lymphocytes Absolute: 1.3 10*3/uL (ref 1.0–5.1)
MCH: 30 pg (ref 26.0–34.0)
MCHC: 33.3 g/dL (ref 31.0–36.0)
MCV: 90.2 fL (ref 80.0–100.0)
MPV: 8.3 fL (ref 5.0–10.5)
Monocytes %: 14.5 %
Monocytes Absolute: 1.4 10*3/uL — ABNORMAL HIGH (ref 0.0–1.3)
Neutrophils %: 68.5 %
Neutrophils Absolute: 6.4 10*3/uL (ref 1.7–7.7)
Platelets: 98 10*3/uL — ABNORMAL LOW (ref 135–450)
RBC: 2.73 M/uL — ABNORMAL LOW (ref 4.20–5.90)
RDW: 14.8 % (ref 12.4–15.4)
WBC: 9.4 10*3/uL (ref 4.0–11.0)

## 2015-07-25 LAB — HEPATIC FUNCTION PANEL
ALT: 71 U/L — ABNORMAL HIGH (ref 10–40)
AST: 254 U/L — ABNORMAL HIGH (ref 15–37)
Albumin: 2.1 g/dL — ABNORMAL LOW (ref 3.4–5.0)
Alkaline Phosphatase: 47 U/L (ref 40–129)
Bilirubin, Direct: <0.2 mg/dL (ref 0.0–0.3)
Total Bilirubin: 0.3 mg/dL (ref 0.0–1.0)
Total Protein: 4.5 g/dL — ABNORMAL LOW (ref 6.4–8.2)

## 2015-07-25 LAB — CK: Total CK: 10956 U/L — ABNORMAL HIGH (ref 39–308)

## 2015-07-25 LAB — PHOSPHORUS: Phosphorus: 5.1 mg/dL — ABNORMAL HIGH (ref 2.5–4.9)

## 2015-07-25 LAB — MAGNESIUM: Magnesium: 2.3 mg/dL (ref 1.80–2.40)

## 2015-07-25 MED FILL — FENTANYL CITRATE (PF) 100 MCG/2ML IJ SOLN: 100 MCG/2ML | INTRAMUSCULAR | Qty: 2

## 2015-07-25 MED FILL — NORMAL SALINE FLUSH 0.9 % IV SOLN: 0.9 % | INTRAVENOUS | Qty: 10

## 2015-07-25 MED FILL — OXYCODONE-ACETAMINOPHEN 5-325 MG PO TABS: 5-325 MG | ORAL | Qty: 2

## 2015-07-25 MED FILL — LEVETIRACETAM 500 MG PO TABS: 500 MG | ORAL | Qty: 1

## 2015-07-25 MED FILL — DOK 100 MG PO CAPS: 100 MG | ORAL | Qty: 1

## 2015-07-25 MED FILL — TYLENOL 325 MG PO TABS: 325 MG | ORAL | Qty: 2

## 2015-07-25 NOTE — Plan of Care (Signed)
Problem: Nutrition  Goal: Optimal nutrition therapy  Nutrition Problem: Increased nutrient needs  Intervention: Food and/or Nutrient Delivery: Continue current diet, Modify current ONS (change supplement to Ensure Clear bid)  Nutritional Goals: Pt to tolerate & consume >/=50% of meals & ONS  Outcome: Ongoing

## 2015-07-25 NOTE — Plan of Care (Signed)
Problem: Falls - Risk of  Intervention: Fall risk assessment  Morse fall score=60.  Intervention: Fall precautions  Bed in low and locked position with alarm activated; 3/4 side rails up.  Bedside table and call light within reach.  Pt alert and oriented, uses call light appropriately for assistance.    Intervention: Occupational psychologist assistance provided q2h.    Goal: Absence of falls  Outcome: Met This Shift  Fall risk assessment completed and documented.  Protocol in place.      Problem: Risk for Impaired Skin Integrity  Goal: Tissue integrity - skin and mucous membranes  Structural intactness and normal physiological function of skin and  mucous membranes.   Outcome: Ongoing  Intervention: SKIN ASSESSMENT  Skin assessment completed and documented.  No signs of new skin breakdown.  Will monitor.  Intervention: PRESSURE ULCER PREVENTION  Bilateral heel protectors in place.  Preventative sacral mepilex border intact and in place.    Intervention: TURN PATIENT  Pt turned and repositioned q2h and upon request.        Problem: Infection - Central Venous Catheter-Associated Bloodstream Infection:  Goal: Will show no infection signs and symptoms  Will show no infection signs and symptoms   Outcome: Ongoing  No s/s at [place] site.  Will monitor.    Problem: Pain:  Goal: Pain level will decrease  Pain level will decrease   Outcome: Ongoing  Goal: Control of acute pain  Control of acute pain   Outcome: Ongoing  Pain being controlled with PRN medications; PO Tylenol and PO Percocet.  Pt has verbalized these medications are appropriately controlling his pain.    Problem: Nutrition  Goal: Optimal nutrition therapy  Outcome: Ongoing  Pt has decreased appetite.  Able to eat approx. 25% of meals.  Oral PO intake is adequate.  Educated about the importance of nutrition and drinking the nutritional supplements that are offered.  Will monitor.

## 2015-07-25 NOTE — Progress Notes (Signed)
Nutrition Assessment    Type and Reason for Visit: Reassess    Nutrition Recommendations:   Monitor po  Discontinue Nepro per pt request  Add Ensure Clear bid  Refer to Md for possible intervention r/t no record of BM since admission  Monitor labs, bowels, skin, wt    Malnutrition Assessment:  ?? Malnutrition Status: At risk for malnutrition  ?? Context: Acute illness or injury  ?? Findings of the 6 clinical characteristics of malnutrition (Minimum of 2 out of 6 clinical characteristics is required to make the diagnosis of moderate or severe Protein Calorie Malnutrition based on AND/ASPEN Guidelines):  1. Energy Intake-Less than or equal to 75% (per flowsheet records), greater than or equal to 5 days    2. Weight Loss-No significant weight loss,    3. Fat Loss-Unable to assess,    4. Muscle Loss-Unable to assess,    5. Fluid Accumulation-Unable to assess,    6. Grip Strength-Not measured    Nutrition Diagnosis:   ?? Problem: Increased nutrient needs  ?? Etiology: related to Increased demand for energy/nutrients due to    ??? Signs and symptoms:  as evidenced by Presence of wounds, Known losses from dialysis    Nutrition Assessment:  ?? Subjective Assessment: Pt remains in the CVICU. Hemodialysis continues with improvement in Rhabdomyolysis noted. Diet orders remains as renal. Intake 0-75 %. Pt reported not liking the Nepro. Agreed to try Ensure Clear bid to start. Wound vac continues to right lower extremity (pt s/p fasciotomy to right upper & lower extremities). Noted no record of BM. Will refer to MD for intervention.   ?? Nutrition-Focused Physical Findings: No BM per records. Will refer to MD for possible intervention. Noted that pt is + 6.9 liters. Noted + 2 edema to right upper extremity & + 3 edema to right lower extremity.  ?? Wound Type: Multiple, Surgical Wound  ?? Current Nutrition Therapies:  ?? Oral Diet Orders: Renal   ?? Oral Diet intake:  (variable at 0-75 %. Noted that some meals brought in by  family)  ?? Oral Nutrition Supplement (ONS) Orders: Renal Supplement  ?? ONS intake: Refused  ?? Anthropometric Measures:  ?? Ht: 6\' 4"  (193 cm)   ?? Current Body Wt: 179 lb (81.2 kg)  ?? Admission Body Wt: 176 lb (79.8 kg)  ?? Ideal Body Wt: 202 lb (91.6 kg), % Ideal Body    ?? BMI Classification: BMI 18.5 - 24.9 Normal Weight  ?? Comparative Standards (Estimated Nutrition Needs):  ?? Estimated Daily Total Kcal: 2150-2580  ?? Estimated Daily Protein (g): 103--129 (on HD and surgical wounds)    Estimated Intake vs Estimated Needs: Intake Less Than Needs    Nutrition Risk Level: High    Nutrition Interventions:   Continue current diet, Modify current ONS (change supplement to Ensure Clear bid)  Continued Inpatient Monitoring, Education not appropriate at this time    Nutrition Evaluation:   ?? Evaluation: Progressing toward goals (slowly)   ?? Goals: Pt to tolerate & consume >/=50% of meals & ONS    ?? Monitoring: Meal Intake, Supplement Intake, Wound Healing, Ascites/Edema, Weight, Pertinent Labs, Diarrhea, Constipation    See Adult Nutrition Doc Flowsheet for more detail.     Electronically signed by Franne Griponi Kandis Henry, RD, LD on 07/25/15 at 7:23 PM    Contact Number: [phone]

## 2015-07-25 NOTE — Progress Notes (Signed)
Rounding with Dr Yolanda MangesZenni. Wound vac was off. Did restart. Did have small leak present. Did apply plastic covering dressing to site around the lower suction port. Did begin continuous pumping at 150 no leak noted. Did then call SPD and request new vacuum and supplies for dressing change.

## 2015-07-25 NOTE — Progress Notes (Signed)
Hospitalist ICU Progress Note    CC: Non-traumatic rhabdomyolysis    Hosp course: 30yo AAM with hx of siezures and drug abuse. Found down at home, likely overdose of heroin - was down for about 12 hours, then had severe rhabdo and severe compartment syndrome of R side - has fasciotomies of R leg and R arm. Pt CPK was initially 270K. Also had shock liver. CRRT started on 2/25. Has R IJ vascath. Will have TDC placed for iHD Has 3 incisions on legs. CPK trending down.      Admit date: 07/18/2015  Days in hospital:  7    24 Hour Events: Awaiting precert    Subjective: Didn't eat much this AM.  Plan for HD again today.      ROS:  A comprehensive review of systems was negative except for: difficult ROS due to failure to make eye contact and not talking  .    Objective:    Visit Vitals   ??? BP 140/70   ??? Pulse 99   ??? Temp 98.8 ??F (37.1 ??C) (Oral)   ??? Resp 16   ??? Ht 6\' 4"  (1.93 m)   ??? Wt 179 lb 0.2 oz (81.2 kg)   ??? SpO2 97%   ??? BMI 21.79 kg/m2       Gen: NAD  HEENT: NC/AT, moist mucous membranes, no oropharyngeal erythema or exudate    Neck: supple, trachea midline, no anterior cervical or SC LAD  Heart:  Normal s1/s2, RRR, no murmurs, gallops, or rubs. R leg edema  Lungs:  diminished bilaterally, no wheeze, no rales, no rhonchi, no crackles, no use of accessory muscles  Abd: bowel sounds present, soft, nontender, nondistended, no masses  Extrem:  No clubbing, cyanosis,  R leg edema, peripheral pulses 2+, brisk capillary refill  Skin: no rashes or lesions, normal color/perfusion  Psych:  A & O x3    Neuro: grossly intact, moves all four extremities.        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  Acute rhabdo - improving with HD  2.  Acute renal failure - dialysis [facility] Possible Tues-thurs- sat schedule  3.  Compartment syndrome of R arm and R leg - wound vac placed to leg - MWF changes  4.  Acute blood loss anemia - received 1u pRBC for hgb drop - blood loss likely from wound vac - stable  5.  Opiate abuse - pt snorts heroin apparently - on prn pi meds for wound care and pain    Stable for DC when Precert obtained.     Prognosis:  Fair    Code status:  FULL  DVT prophylaxis: []  Lovenox  []  SQ Heparin  [x]  SCDs because wound bleeding []  warfarin/oral direct thrombin inhibitor []  Encourage ambulation    GI prophylaxis: []  PPI/H2blocker  [x]  not indicated    Disposition  transfer to monitored bed  Medications:  Scheduled Meds:  ??? levETIRAcetam  500 mg Oral Q24H   ??? sodium chloride flush  10 mL Intravenous 2 times per day   ??? docusate sodium  100 mg Oral BID       PRN Meds:  heparin (porcine), fentanNYL, sodium chloride flush, acetaminophen, oxyCODONE-acetaminophen **OR** oxyCODONE-acetaminophen, ondansetron    IV:         Intake/Output Summary (Last 24 hours) at 07/25/15 0933  Last data filed at 07/25/15 0800   Gross per 24 hour   Intake             2850 ml   Output             2300 ml   Net              550 ml       Results:  CBC:   Recent Labs      07/23/15   0424  07/24/15   0428  07/25/15   0343   WBC  8.8  9.4  9.4   HGB  7.4*  8.6*  8.2*   HCT  22.8*  26.0*  24.6*   MCV  91.9  91.5  90.2   PLT  78*  95*  98*     BMP:   Recent Labs      07/23/15   0424  07/24/15   0428  07/25/15   0343   NA  135*  133*  133*   K  4.7  5.7*  5.4*   CL  102  100  99   CO2  23  20*  22   PHOS  3.0  4.5  5.1*   BUN  34*  61*  50*   CREATININE  4.6*  8.0*  6.7*     Mag: No results for input(s): MAG in the  last 72 hours.  LIVER PROFILE:   Recent Labs      07/23/15   0424  07/24/15   0428  07/25/15   0343   AST  434*  332*  254*   ALT  104*  83*  71*   BILIDIR  <0.2  <0.2  <0.2   BILITOT  0.3  0.3  0.3   ALKPHOS  56  55  47     PT/INR:   Recent Labs      07/23/15   0900   PROTIME  9.7   INR  0.86     APTT:   No results for input(s): APTT in the last 72 hours.  UA:No results for input(s): NITRITE, COLORU, PHUR, LABCAST, WBCUA, RBCUA, MUCUS, TRICHOMONAS, YEAST, BACTERIA, CLARITYU, SPECGRAV, LEUKOCYTESUR, UROBILINOGEN, BILIRUBINUR, BLOODU, GLUCOSEU, AMORPHOUS in the last 72 hours.    Invalid input(s): KETONESU    ABG:   Lab Results   Component Value Date    PHART 7.339 07/19/2015    PCO2ART 38.3 07/19/2015    PO2ART 238.0 07/19/2015    HCO3ART 20.1 07/19/2015    BEART -4.8 07/19/2015    TCO2ART 21.2 07/19/2015    O2SATART 99.6 07/19/2015       Lab Results   Component Value Date    CALCIUM 7.8 (L) 07/25/2015    PHOS 5.1 (H) 07/25/2015             Electronically signed by Despina Hidden, MD on 07/25/2015 at 9:33 AM

## 2015-07-25 NOTE — Care Coordination-Inpatient (Signed)
[employer] case manager available to assist with discharge if needed  is Taya at phone [phone] (secure and confidential). Davonna BellingNancy J Annya Lizana RN, 07/25/2015, 11:45 AM. [phone]

## 2015-07-25 NOTE — Progress Notes (Signed)
[facility]   Progress Note      S/P :  SUBJECTIVE  Alert and oriented. Pain is   described in right forearm and with the intensity of moderate. Pain is described as aching, throbbing.       OBJECTIVE              Physical                      VITALS:    Visit Vitals   ??? BP 140/70   ??? Pulse 99   ??? Temp 98.8 ??F (37.1 ??C) (Oral)   ??? Resp 16   ??? Ht  (1.93 m)   ??? Wt 179 lb 0.2 oz (81.2 kg)   ??? SpO2 97%   ??? BMI 21.79 kg/m2                       MUSCULOSKELETAL:  right hand NVI. Wiggles fingers to command. Radial pulses are palpable.Moderate swelling right hand and fingers limits ROM. Swelling a bit worse than yesterday but arm is lying flat.                    NEUROLOGIC:                                  Sensory:  Touch:  Right Upper Extremity:  normal                                                 Surgical wound appears clean and dry right forearm. I removed right forearm staples and vessel loop at direction of Dr Su Hoff for today. Small bleeding began but well controlled with pressure. Wound is clean and gaping about 1 cm proximal to distal . No purulence, no necrosis noted. Moderate swelling of right forearm but no induration noted.     Data       CBC:   Lab Results   Component Value Date    WBC 9.4 07/25/2015    RBC 2.73 07/25/2015    HGB 8.2 07/25/2015    HCT 24.6 07/25/2015    MCV 90.2 07/25/2015    MCH 30.0 07/25/2015    MCHC 33.3 07/25/2015    RDW 14.8 07/25/2015    PLT 98 07/25/2015    MPV 8.3 07/25/2015        WBC:    Lab Results   Component Value Date    WBC 9.4 07/25/2015        Hemoglobin/Hematocrit:    Lab Results   Component Value Date    HGB 8.2 07/25/2015    HCT 24.6 07/25/2015        PT/INR:    Lab Results   Component Value Date    PROTIME 9.7 07/23/2015    INR 0.86 07/23/2015              Current Inpatient Medications             Current Facility-Administered Medications: levETIRAcetam (KEPPRA) tablet 500 mg, 500 mg, Oral, Q24H  heparin (porcine) injection 3,000 Units, 3,000 Units,  Intercatheter, PRN  fentaNYL (SUBLIMAZE) injection 50 mcg, 50 mcg, Intravenous, Q4H PRN  sodium chloride flush 0.9 % injection 10 mL, 10 mL, Intravenous,  2 times per day  sodium chloride flush 0.9 % injection 10 mL, 10 mL, Intravenous, PRN  acetaminophen (TYLENOL) tablet 650 mg, 650 mg, Oral, Q4H PRN  oxyCODONE-acetaminophen (PERCOCET) 5-325 MG per tablet 1 tablet, 1 tablet, Oral, Q4H PRN **OR** oxyCODONE-acetaminophen (PERCOCET) 5-325 MG per tablet 2 tablet, 2 tablet, Oral, Q4H PRN  docusate sodium (COLACE) capsule 100 mg, 100 mg, Oral, BID  ondansetron (ZOFRAN) injection 4 mg, 4 mg, Intravenous, Q6H PRN    ASSESSMENT AND PLAN      Rhabdomyolysis  Drug abuse  Right forearm compartment syndrome, post fasciotomy 7 days ago per Dr Su HoffARebi  RIght leg fasciotomy per vascular, management per vascular  Plan daily and prn wet to dry right forearm dressing as ordered.   FU Dr Su HoffArebi next week for evaluation of wound at office. If remains inpt I will see here  Elevate right hand on pillows to reduce swelling.     Jackelyn PolingMargaret M Caliegh Middlekauff  07/25/2015  10:18 AM

## 2015-07-25 NOTE — Care Coordination-Inpatient (Signed)
[facility]  I escalated [facility] precert request this am.

## 2015-07-25 NOTE — Progress Notes (Signed)
Critical  Fibrinogen level 87 called to Dr Corinda GublerVerma. No new orders.

## 2015-07-25 NOTE — Care Coordination-Inpatient (Signed)
Updated patient and his mother regarding ltac denial and p2p appeal.   I will continue to follow to assist with discharge.  Davonna BellingNancy J Justun Anaya RN, 07/25/2015, 3:06 PM. [phone]

## 2015-07-25 NOTE — Progress Notes (Signed)
To bedside for scheduled HD Tx. Accessed RIJ tunnel per protocol and tx initiated. Net UF goal set for .5L       07/25/15 1945   Vitals   Temp 98.5 ??F (36.9 ??C)   Pulse 95   Resp 14   BP (!) 121/59   MAP (mmHg) 73   Level of Consciousness 1   MEWS Score 1   Height and Weight   Weight 179 lb 0.2 oz (81.2 kg)   Weight Method Actual;Bed scale   BMI (Calculated) 21.8         Excellent fx of tunnel cath. Net UF removed .5 liters without difficulty. Cath capped and heparinized per protocol. Report rendered to Tobi Bastosnna and [facility]ES charting placed into chart for scanning to the EMR.        07/25/15 2330   Vital Signs   BP 117/64   Temp 98.5 ??F (36.9 ??C)   Weight 177 lb 14.6 oz (80.7 kg)   Weight Method Bed scale   Percent Weight Change -0.62

## 2015-07-25 NOTE — Care Coordination-Inpatient (Addendum)
[facility] Wound Ostomy Continence Nurse  Follow-up Progress Note       NAME:  Roy Weaver  MEDICAL RECORD NUMBER:  [id number]  AGE:  30 y.o.   GENDER:  male  DOB:  1985/08/03  TODAY'S DATE:  07/25/2015    Subjective:   Follow up to change NWPT dressing to right leg wounds.   Patient pre-medicated with PO pain medication.   Dressings removed. Dressing removal painful and patient yelling "MY LEG!"   Wounds assessed. Copious serosanguinous drainage with granulation tissue, exposed muscle, fascia, and ligaments.   IV Fentanyl given by bedside nurse during dressing change due to excoriating pain.   ALL 3 wounds bridged together. Eakin ring used to seal right groin area. Leak detector checked with adequate seal.   Dressing change took 2 WOC nurses and bedside nurse 1 hours and 20 minutes to complete.   Difficult dressing change due to copious drainage, pain, and wound location.     See wound details, measurements, photos of dressing, and plan of care below.   Will continue to follow.     Wound Identification:  Wound Type: non-healing surgical  Contributing Factors: edema, chronic pressure, decreased mobility, shear force, malnutrition, poor hygiene and substance abuse        Patient Goal of Care:  [x]  Wound Healing  []  Odor Control  []  Palliative Care  []  Pain Control   []  Other:     Objective:      Visit Vitals   ??? BP (!) 148/84   ??? Pulse 84   ??? Temp 98.6 ??F (37 ??C) (Oral)   ??? Resp 11   ??? Ht 6\' 4"  (1.93 m)   ??? Wt 179 lb 0.2 oz (81.2 kg)   ??? SpO2 100%   ??? BMI 21.79 kg/m2     Braden Risk Score: Braden Scale Score: 13 at risk   Assessment:   Measurements:  Negative Pressure Wound Therapy Leg Right;Lateral;Medial;Lower (Active)   Wound Type Surgical 07/25/2015 12:31 PM   Assessment Fragile;Red;Pink;Swelling;Yellow;Fibrin;Drainage 07/25/2015 12:31 PM   Peri-wound Assessment Dry;Intact 07/25/2015 12:31 PM   Shape see below  07/21/2015  4:22 PM   Unit Type KCI vac  07/25/2015 12:31 PM   Dressing Type Black  foam 07/25/2015 12:31 PM   Number of pieces used 8 07/25/2015 12:31 PM   Cycle Continuous;On 07/25/2015 12:31 PM   Target Pressure (mmHg) 175 07/25/2015 12:31 PM   Canister changed? No 07/25/2015 12:31 PM   Dressing Status Changed 07/25/2015 12:31 PM   Dressing Changed Changed/New 07/25/2015 12:31 PM   Drainage Amount Copious 07/25/2015 12:31 PM   Drainage Description Serosanguinous 07/25/2015 12:31 PM   Dressing Change Due 07/28/15 07/25/2015 12:31 PM   Odor None 07/25/2015 12:31 PM   Output (ml) 25 ml 07/25/2015 10:15 AM   Number of days:3      07/25/15 Right lateral thigh and medial thigh with bridge in between wounds to connect   2 trac pads used to apply enough negative pressure and manage drainage.      Incision 07/18/15 Leg Right;Lateral (Active)   Assessment Painful;UTA 07/25/2015 12:30 PM   Peri-wound Assessment Clean;Dry;Intact 07/24/2015  4:15 PM   Wound Length (cm) 46 cm 07/21/2015  1:42 PM   Wound Width (cm) 14.3 cm 07/21/2015  1:42 PM   Depth (cm)  3 07/21/2015  1:42 PM   Drainage Amount None 07/23/2015  4:00 AM   Drainage Description Serosanguinous;Sanguinous 07/21/2015  1:42 PM   Odor None 07/21/2015  1:42 PM   Dressing/Treatment Vacuum dressing 07/25/2015 12:31 PM   Dressing Changed Changed/New 07/25/2015 12:31 PM   Dressing Status Changed 07/25/2015 12:31 PM   Dressing Change Due 07/28/15 07/25/2015 12:31 PM   Number of days:6       Incision 07/18/15 Arm Right;Lower (Active)   Assessment Painful;Intact;Bleeding;Swelling 07/25/2015 12:30 PM   Peri-wound Assessment Painful;Intact 07/25/2015 12:30 PM   Closure Staples 07/25/2015  8:05 AM   Drainage Amount Moderate 07/25/2015 12:30 PM   Drainage Description Other (Comment) 07/25/2015 12:30 PM   Odor None 07/25/2015 12:30 PM   Dressing/Treatment Moist to dry;Ace Wrap 07/25/2015 12:30 PM   Dressing Changed Changed/New 07/25/2015 12:30 PM   Dressing Status Changed;Clean;Dry;Intact 07/25/2015 12:30 PM   Dressing Change Due 07/26/15 07/25/2015 12:30 PM   Number of days:6       Incision 07/21/15 Leg Right;Lower (Active)    Assessment Painful;UTA 07/25/2015 12:30 PM   Wound Length (cm) 22 cm 07/21/2015  1:42 PM   Wound Width (cm) 6.5 cm 07/21/2015  1:42 PM   Depth (cm)  0.9 07/21/2015  1:42 PM   Drainage Amount Moderate 07/21/2015  1:42 PM   Drainage Description Serosanguinous 07/21/2015  1:42 PM   Odor None 07/21/2015  1:42 PM   Dressing/Treatment Vacuum dressing 07/25/2015 12:31 PM   Dressing Changed Changed/New 07/25/2015 12:31 PM   Dressing Status Changed 07/25/2015 12:31 PM   Dressing Change Due 07/28/15 07/25/2015 12:31 PM   Number of days:4      07/25/15 Right lateral thigh and calf NWPT dressing with bridge in between thigh and calf       Incision 07/21/15 Leg Right;Medial (Active)   Assessment Painful;UTA 07/25/2015 12:30 PM   Peri-wound Assessment UTA 07/25/2015 12:30 PM   Wound Length (cm) 29.5 cm 07/21/2015  1:42 PM   Wound Width (cm) 10 cm 07/21/2015  1:42 PM   Depth (cm)  2.5 07/21/2015  1:42 PM   Drainage Amount Copious 07/21/2015  1:42 PM   Drainage Description Sanguinous 07/21/2015  1:42 PM   Odor None 07/21/2015  1:42 PM   Dressing/Treatment Vacuum dressing 07/25/2015 12:31 PM   Dressing Changed Changed/New 07/25/2015 12:31 PM   Dressing Status Changed 07/25/2015 12:31 PM   Dressing Change Due 07/28/15 07/25/2015 12:31 PM   Number of days:4     07/25/15 Right medial thigh and groin NWPT dressing   Eakin ring used to seal groin     Response to treatment:  Poorly tolerated by patient., With complaints of pain.     Pain Assessment:  Severity:  10 / 10  Quality of pain: sharp, aching, burning, throbbing, shooting, tender  Wound Pain Timing/Severity: constant  Premedicated: Yes  Plan:   Plan of Care: Incision 07/18/15 Leg Right;Lateral-Dressing/Treatment: Vacuum dressing  Incision 07/18/15 Arm Right;Lower-Dressing/Treatment: Moist to dry, Ace Wrap  Incision 07/21/15 Leg Right;Lower-Dressing/Treatment: Vacuum dressing  Incision 07/21/15 Leg Right;Medial-Dressing/Treatment: Vacuum dressing     1. Implement Braden prevention orders. See orders for prevention  order details   2. Implement wound care orders. See wound care orders for dressing change instructions   Right leg: NWPT dressing to ALL 3 wounds    Right arm: Moistened Aquacel Ag dressing over open wound secure with dry dressing and ace wrap from hands to elbow   3. Bedside nurse to change NWPT cannister when 3/4 full. DO NOT ALLOW cannister to overfill which will cause backflow into the wound and compromise the NWPT dressing   4. NO Tegaderm on dressing use  wound vac drape    5. If patient goes to [facility]) send patient with the NWPT dressing and machine intact. Bedside nurse to Inform WOC nurse and Supply Chain that the NWPT machine was sent with the patient.   6. If the patient goes to ECF NWPT dressing has to be removed and replaced with moist gauze dressing secured with ABD and dry kerlix.   7. Extra canisters and wound vac drape ordered by bedside nurse    8. Will continue to follow while inpatient.      Specialty Bed Required : Yes    Low Air Loss    Pressure Redistribution   Fluid Immersion: Dolphin    Bariatric   Total Pressure Relief   Other:     Current Diet: DIET RENAL;  Dietary Nutrition Supplements: Renal Oral Supplement  Dietician consult:  Yes    Discharge Plan:  Placement for patient upon discharge: intermediate care facility   Patient appropriate for Outpatient Wound Care Center: Yes    Referrals:   Social Worker   Home Health Care   Supplies   Other    Patient/Caregiver Teaching:  Level of patient/caregiver understanding able to: Spoke with patient and bedside nurse about plan of care    Indicates understanding        Needs reinforcement   Unsuccessful       Verbal Understanding   Demonstrated understanding        No evidence of learning   Refused teaching          N/A       Electronically signed by Annye English' Laural Benes, RN, CWOCN on 07/25/2015 at 2:14 PM

## 2015-07-25 NOTE — Progress Notes (Signed)
Nephrology Progress Note  [phone]  [phone]   [email or web address]    Patient:  Roy Weaver   DOB: 07-31-1985    CC:  AKI    Subjective:  Pt remains in the CVICU.  HD again later today.  No issues with HD yesterday.    ROS:   Limited d/t pt factors this morning.  He does deny pain.    SHx:  No visitors this morning.    Meds:  Current Facility-Administered Medications   Medication Dose Route Frequency Provider Last Rate Last Dose   ??? levETIRAcetam (KEPPRA) tablet 500 mg  500 mg Oral Q24H Markus JarvisJamelle Renee Bowers, MD   500 mg at 07/25/15 0016   ??? heparin (porcine) injection 3,000 Units  3,000 Units Intercatheter PRN Earl Galaahir Sajjad, MD   3,000 Units at 07/22/15 2308   ??? fentaNYL (SUBLIMAZE) injection 50 mcg  50 mcg Intravenous Q4H PRN Loann QuillJulie E Lurie, NP   50 mcg at 07/23/15 0910   ??? sodium chloride flush 0.9 % injection 10 mL  10 mL Intravenous 2 times per day Filbert BertholdSameh M Arebi, MD   10 mL at 07/24/15 2146   ??? sodium chloride flush 0.9 % injection 10 mL  10 mL Intravenous PRN Filbert BertholdSameh M Arebi, MD       ??? acetaminophen (TYLENOL) tablet 650 mg  650 mg Oral Q4H PRN Filbert BertholdSameh M Arebi, MD   650 mg at 07/24/15 2146   ??? oxyCODONE-acetaminophen (PERCOCET) 5-325 MG per tablet 1 tablet  1 tablet Oral Q4H PRN Filbert BertholdSameh M Arebi, MD   1 tablet at 07/22/15 2130    Or   ??? oxyCODONE-acetaminophen (PERCOCET) 5-325 MG per tablet 2 tablet  2 tablet Oral Q4H PRN Filbert BertholdSameh M Arebi, MD   2 tablet at 07/24/15 1225   ??? docusate sodium (COLACE) capsule 100 mg  100 mg Oral BID Filbert BertholdSameh M Arebi, MD   100 mg at 07/24/15 2146   ??? ondansetron (ZOFRAN) injection 4 mg  4 mg Intravenous Q6H PRN Filbert BertholdSameh M Arebi, MD           Vitals:  Visit Vitals   ??? BP 140/70   ??? Pulse 99   ??? Temp 98.8 ??F (37.1 ??C) (Oral)   ??? Resp 16   ??? Ht 6\' 4"  (1.93 m)   ??? Wt 179 lb 0.2 oz (81.2 kg)   ??? SpO2 97%   ??? BMI 21.79 kg/m2       Physical Exam:  Gen: Resting in bed, NAD.  In CVICU.  HEENT: MMM, OP clear.  CV: RRR no m/r/g.  No S3.  Lungs: CTA-B  Abd: S/NT +BS  Ext: RLE fasciotomy with  woundVAC.  Skin: Warm.  No rashes appreciated.  Access: Right IJ tunneled HD catheter c/d/i.    Labs:  CBC:   Lab Results   Component Value Date    WBC 9.4 07/25/2015    RBC 2.73 07/25/2015    HGB 8.2 07/25/2015    HCT 24.6 07/25/2015    MCV 90.2 07/25/2015    MCH 30.0 07/25/2015    MCHC 33.3 07/25/2015    RDW 14.8 07/25/2015    PLT 98 07/25/2015    MPV 8.3 07/25/2015     BMP:    Lab Results   Component Value Date    NA 133 07/25/2015    K 5.4 07/25/2015    CL 99 07/25/2015    CO2 22 07/25/2015    BUN 50 07/25/2015    LABALBU 2.1 07/25/2015  CREATININE 6.7 07/25/2015    CREATININE 1.0 08/17/2011    CALCIUM 7.8 07/25/2015    GFRAA 12 07/25/2015    GFRAA 157 08/19/2011    LABGLOM 10 07/25/2015    GLUCOSE 107 07/25/2015       Assessment/Plan:    AKI: due to rhabdomyolysis-initiated on CRRT due to hyperkalemia CRRT stopped 2/27  -hemodynamically stable  -TDC placed, plan for HD again today to get him on a MWF schedule.  -Watch for signs of renal recovery at [facility].  ??  FEN: hyperkalemia improving.  HD today.    Rhabdomyolysis: With compartment syndrome S/P fasciotomies CK is improving.  ??  Transamnitis: ischemic injury, improving  ??  Anemia s/p PRBC, hgb in the 8's now.  ??  compartment syndrome right: s/p fasciotomy  -per orthopedic surgery and Dr. Yolanda Manges  ??  Polysubstance abuse  ??  Corky Downs, MD, FASN, FACP, RPVI  07/25/2015

## 2015-07-25 NOTE — Progress Notes (Signed)
Occupational Therapy  Facility/Department: [facility] CV[facility]  Daily Treatment Note  NAME: Roy Weaver  DOB: 03-Mar-1986  MRN: [id number]     Patient with diagnoses of Heroin Overdose, Traumatic Rhabdomyolysis, R UE/LE Compartment Syndromes, AKI  on CRRT,  07/18/15 R Forearm Fasciotomy of Flexor Compartment and R Thigh/Calf Fasciotomies, Acute toxic encephalopathy demonstrating decreased functional status and is indicated for skilled OT services. Will continue to assess for discharge planning. Recommend continued OT in LTAC setting  and eventual advancement to a SNF vs Inpatient Rehab Program.    Date of Service: 07/25/2015    Patient Diagnosis(es):   Patient Active Problem List    Diagnosis Date Noted   ??? Acute blood loss anemia    ??? Traumatic rhabdomyolysis (HCC)    ??? Seizure disorder (HCC)    ??? Acute respiratory failure with hypoxia (HCC) 07/19/2015   ??? Non-traumatic rhabdomyolysis    ??? AKI (acute kidney injury) (HCC)    ??? Traumatic compartment syndrome (HCC)    ??? 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 Post-ictal state (HCC) 07/12/2015   ??? Breakthrough seizure (HCC)    ??? Visit for suture removal 03/25/2015   ??? Seizure (HCC) 04/10/2014   ??? Aspiration pneumonia of right lung (HCC) 03/30/2014   ??? 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 Seizures (HCC) 02/14/2014   ??? Partial symptomatic epilepsy with complex partial seizures, intractable, without status epilepticus (HCC)    ??? Noncompliance with medication regimen    ??? Partial epilepsy with impairment of consciousness (HCC) 11/07/2013   ??? Delirium    ??? Drug use    ??? Recurrent seizures (HCC) 09/14/2013   ??? History of nonadherence to medical treatment 09/14/2013   ??? Migraine 05/03/2013   ??? Seizures (HCC) 04/09/2013       Restrictions  Restrictions/Precautions  Restrictions/Precautions: Fall Risk, Weight Bearing  Lower Extremity Weight Bearing Restrictions  Right Lower Extremity Weight Bearing: Weight Bearing  As Tolerated  Upper Extremity Weight Bearing Restrictions  Right Upper Extremity Weight Bearing: Non Weight Bearing  Position Activity Restriction  Other position/activity restrictions:   Fasciotomy R Forearm, R Thigh, and R Calf regions.  Heroin Abuse.  Dr. Yolanda Manges at bedside, advised that pt is WBAT R LE.  (Need to check with Dr. Su Hoff to clarify if able to wgt bear R UE, currently NWB order).   Subjective   General  Chart Reviewed: Yes  Patient assessed for rehabilitation services?: Yes  Additional Pertinent Hx:  Seizure, Aspirative Pneumonia, Delirium, Migraine, AKI, Active Smoker ~ 1 PPD  Referring Practitioner: Filbert Berthold, MD  Diagnosis: Heroin Overdose, Traumatic Rhabdomyolysis, R UE/LE Compartment Syndromes, AKI  on CRRT,  07/18/15 R Forearm Fasciotomy of Flexor Compartment and R Thigh/Calf Fasciotomies, Acute toxic encephalopathy  Pain Assessment  Patient Currently in Pain: Yes  Pain Assessment: 0-10  Pain Level: 4  Pain Type: Acute pain  Pain Location: Leg  Pain Orientation: Right  Vital Signs  Patient Currently in Pain: Yes   Orientation  Orientation  Overall Orientation Status: Within Functional Limits  Objective    ADL  Feeding: Setup;Minimal assistance  Grooming: Dependent/Total  UE Bathing: Dependent/Total  LE Bathing: Dependent/Total  UE Dressing: Dependent/Total  Toileting: Dependent/Total     Standing Balance  Sit to stand: Unable to assess  Stand to sit: Unable to assess  Bed mobility  Rolling to Left: Dependent/Total  Rolling to Right: Dependent/Total  Supine to Sit: Unable to  assess  Sit to Supine: Unable to assess  Transfers  Stand Step Transfers: Unable to assess  Sit to stand: Unable to assess  Stand to sit: Unable to assess  Transfer Comments: Not assessed due to medical status      Cognition  Overall Cognitive Status: WFL      Type of ROM/Therapeutic Exercise  Type of ROM/Therapeutic Exercise: AAROM;PROM;AROM  Comment: Focus on BUE Ther Ex and particularly with RUE.  Patient again  instructed in need to work with fisting exercises for closure and to decrease swelling in hand.  Patient to also work on finger wrist extension to maintain excurion of flexor compartment for extension.  LUE PROM (degrees)  LUE PROM: WFL  RUE PROM (degrees)  RUE General PROM: Limited due to discomfort associated with faciotomy  Right Hand PROM (degrees)  Right Hand PROM: WFL  Right Hand AROM (degrees)  Right Hand General AROM: Limited Actively to approximately 50% closure, marked swelling dorsum of hand     Assessment   Performance deficits / Impairments: Decreased functional mobility ;Decreased ADL status;Decreased ROM;Decreased strength;Decreased endurance;Decreased balance  Assessment: Patient with diagnoses of Heroin Overdose, Traumatic Rhabdomyolysis, R UE/LE Compartment Syndromes, AKI  on CRRT,  07/18/15 R Forearm Fasciotomy of Flexor Compartment and R Thigh/Calf Fasciotomies, Acute toxic encephalopathy demonstrating decreased functional status and is indicated for skilled OT services  Treatment Diagnosis: Weakness, Decreased ADLs and Mobility and Decreased functional use of RUE  Prognosis: Fair  Patient Education: Review of exercises to be done by patient for BUEs  Discharge Recommendations: Continue to assess pending progress;Patient would benefit from continued therapy after discharge (Will continue to assess for discharge planning. Recommend continued OT in LTAC setting  and eventual advancement to a SNF vs Inpatient Rehab Program..)  REQUIRES OT FOLLOW UP: Yes  Activity Tolerance  Activity Tolerance: Treatment limited secondary to medical complications (free text);Patient limited by pain;Patient limited by fatigue  Activity Tolerance: On Room Chiropractor Devices in place: Yes  Type of devices: Call light within reach;Bed alarm in place;Left in bed;Nurse notified  Restraints  Initially in place: No         Discharge Recommendations:  Continue to assess pending progress, Patient would benefit  from continued therapy after discharge (Will continue to assess for discharge planning. Recommend continued OT in LTAC setting  and eventual advancement to a SNF vs Inpatient Rehab Program..)     Plan   Plan  Times per week: 3 to 5 times per week while in hospital  Specific instructions for Next Treatment: CoTx, WBAT RLE, NWB RUE, Wound Vac RLE, Faciotomies RUE / RLE, Strict I/O, CRRT with Right IJ VasCath  Current Treatment Recommendations: Strengthening, ROM, Functional Mobility Training, Teaching laboratory technician, Patent attorney, Equities trader, Self-Care / ADL  Plan Comment: See last progress note for discharge status    G-Code  OT G-codes  Self Care Current Status [phone]): At least 80 percent but less than 100 percent impaired, limited or restricted  Self Care Goal Status (U0454): At least 60 percent but less than 80 percent impaired, limited or restricted    Goals  Short term goals  Time Frame for Short term goals: 2 Weeks  All goals ongoing  Short term goal 1: Mod A of 2 for basic stand pivot transfer  Short term goal 2: Patient tolerate sitting EOB 4 to 6 minutes with assist of 1-2  Short term goal 3: Patient be able to feed self 25%  of meal after set up  Short term goal 4: Patient tolerate Ther Ex Program to RUE, per physician guidelines  Long term goals  Time Frame for Long term goals : TBD  Patient Goals   Patient goals : Be able to return home       Therapy Time   Individual Concurrent Group Co-treatment   Time In 1401         Time Out 1435         Minutes [address]               Ledford Goodson David Tavionna Grout, OTR/L  [place] [phone]

## 2015-07-25 NOTE — Progress Notes (Signed)
VASCULAR   POD#6  ????  Eating. Tolerating VAC but leaking today.  ????  VSS afeb  Palpable R DP pulse  Thigh/calf soft  4/5 dorsiflexion R foot  VAC in place with leak.  ????  A/P:   S/P R thigh and calf fasciotomies   Continue VAC with M-W-F changes.   Continue PT/OT with weight bearing as tolerated.   OK for LTAC transfer prn with office f/u in 2-3 weeks  ????   Dolores HooseGreg Shaunie Boehm

## 2015-07-25 NOTE — Care Coordination-Inpatient (Signed)
Negative Pressure    NAME:  Roy Weaver  DATE OF BIRTH:  02/15/1986  MEDICAL RECORD NUMBER:  [id number]  DATE:  07/18/2015    ??? [x]  Removed old wound/ulcer Negative pressure therapy dressing if indicated     and cleansed wound gently with normal saline.   ??? Applied Negative Pressure to Right leg wound(s)/ulcer(s).  ??? [x]  Applied skin barrier prep to peri-wound.   ??? [x]  Cut strips of plastic drape to picture frame wound so that peri-wound is     covered with the drape.   ??? [x]  If bridging dressing to less prominent site, cover any intact skin that will come in contact with the Negative Pressure Therapy sponge, gauze or channel drain with plastic drape.  The sponge should never touch intact skin.   ??? [x]  Cut sponge, gauze or channel drain to size which will fit into the wound/ulcer bed without being forced.   ??? [x]  Be sure the sponge is large enough to hold the entire round plastic flange which is attached to the tubing.  Never allow flange to be larger than the sponge or it will produce suction damaging intact skin.   ??? [x]  If bridging the dressing away from the primary site, be sure the bridge leads to a piece of sponge large enough to hold the entire flange without allowing any of the flange to overlap onto intact skin.   ??? [x]  Covered sponge, gauze or channel drain with plastic drape.   ??? [x]  Cut a hole in this plastic drape directly over the sponge the same size as the plastic drain tubing.   ??? [x]  Removed plastic liner from flange and apply it directly over the hole you cut.   ??? [x]  Removed the plastic cover from the flange.   ??? [x]  Attached the tubing to the wound/ulcer Negative Pressure Therapy and turn it on to be sure a vacuum is created and that there are no leaks.   ??? [x]  If air leaks occur, use plastic drape to patch them.   ??? []  Secured Negative Pressure Therapy dressing with ace wrap loosely if located on an extremity. Maintain tubing outside of ace wrap. Tubing must not exert pressure on intact  skin.    Applied per Manufacturer Guidelines      Electronically signed by Rebbeca PaulFarsha' Detre' Kyvon Hu, RN on 07/25/2015 at 3:05 PM

## 2015-07-25 NOTE — Care Coordination-Inpatient (Signed)
[facility]  Dr. Sherrilyn RistKesari is willing to do p2p appeal.  Contacted p2p line at [phone] and left p2p request with Dr. Kerrin ChampagneKesari's phone # and availability.

## 2015-07-25 NOTE — Plan of Care (Signed)
Problem: Risk for Impaired Skin Integrity  Goal: Tissue integrity - skin and mucous membranes  Structural intactness and normal physiological function of skin and  mucous membranes.   Outcome: Ongoing  Turn and reposition at least q 2hrs. Monitor for any redness or skin breakdown    Problem: Pain:  Goal: Pain level will decrease  Pain level will decrease   Outcome: Ongoing  Pt alert and oriented. Pt able to communicate present pain and use the pain scale appropriately. Nonpharmacological pain reducers and pain medication offered as needed. Will cont to monitor.

## 2015-07-26 LAB — CBC WITH AUTO DIFFERENTIAL
Bands Relative: 2 % (ref 0–7)
Basophils %: 0 %
Basophils Absolute: 0 10*3/uL (ref 0.0–0.2)
Eosinophils %: 4 %
Eosinophils Absolute: 0.5 10*3/uL (ref 0.0–0.6)
Hematocrit: 24.6 % — ABNORMAL LOW (ref 40.5–52.5)
Hemoglobin: 8.4 g/dL — ABNORMAL LOW (ref 13.5–17.5)
Lymphocytes %: 15 %
Lymphocytes Absolute: 1.7 10*3/uL (ref 1.0–5.1)
MCH: 30.7 pg (ref 26.0–34.0)
MCHC: 34 g/dL (ref 31.0–36.0)
MCV: 90.2 fL (ref 80.0–100.0)
MPV: 9.3 fL (ref 5.0–10.5)
Metamyelocytes Relative: 4 % — AB
Monocytes %: 8 %
Monocytes Absolute: 0.9 10*3/uL (ref 0.0–1.3)
Myelocyte Percent: 3 % — AB
Neutrophils %: 64 %
Neutrophils Absolute: 8.2 10*3/uL — ABNORMAL HIGH (ref 1.7–7.7)
PLATELET SLIDE REVIEW: DECREASED
Platelets: 104 10*3/uL — ABNORMAL LOW (ref 135–450)
RBC: 2.73 M/uL — ABNORMAL LOW (ref 4.20–5.90)
RDW: 15.1 % (ref 12.4–15.4)
WBC: 11.3 10*3/uL — ABNORMAL HIGH (ref 4.0–11.0)

## 2015-07-26 LAB — BASIC METABOLIC PANEL
Anion Gap: 13 (ref 3–16)
BUN: 42 mg/dL — ABNORMAL HIGH (ref 7–20)
CO2: 20 mmol/L — ABNORMAL LOW (ref 21–32)
Calcium: 7.9 mg/dL — ABNORMAL LOW (ref 8.3–10.6)
Chloride: 101 mmol/L (ref 99–110)
Creatinine: 5.4 mg/dL (ref 0.9–1.3)
GFR African American: 15 — AB (ref >60)
GFR Non-African American: 13 — AB (ref >60)
Glucose: 108 mg/dL — ABNORMAL HIGH (ref 70–99)
Potassium: 5.7 mmol/L — ABNORMAL HIGH (ref 3.5–5.1)
Sodium: 134 mmol/L — ABNORMAL LOW (ref 136–145)

## 2015-07-26 LAB — HEPATIC FUNCTION PANEL
ALT: 61 U/L — ABNORMAL HIGH (ref 10–40)
AST: 189 U/L — ABNORMAL HIGH (ref 15–37)
Albumin: 2.1 g/dL — ABNORMAL LOW (ref 3.4–5.0)
Alkaline Phosphatase: 44 U/L (ref 40–129)
Bilirubin, Direct: <0.2 mg/dL (ref 0.0–0.3)
Total Bilirubin: 0.5 mg/dL (ref 0.0–1.0)
Total Protein: 4.6 g/dL — ABNORMAL LOW (ref 6.4–8.2)

## 2015-07-26 LAB — PHOSPHORUS: Phosphorus: 5.6 mg/dL — ABNORMAL HIGH (ref 2.5–4.9)

## 2015-07-26 LAB — CK: Total CK: 8139 U/L — ABNORMAL HIGH (ref 39–308)

## 2015-07-26 LAB — MAGNESIUM: Magnesium: 2.2 mg/dL (ref 1.80–2.40)

## 2015-07-26 MED ORDER — SODIUM CHLORIDE 0.9 % IV SOLN
0.9 % | INTRAVENOUS | Status: AC
Start: 2015-07-26 — End: 2015-07-26

## 2015-07-26 MED FILL — DOK 100 MG PO CAPS: 100 MG | ORAL | Qty: 1

## 2015-07-26 MED FILL — OXYCODONE-ACETAMINOPHEN 5-325 MG PO TABS: 5-325 MG | ORAL | Qty: 2

## 2015-07-26 MED FILL — HEPARIN SODIUM (PORCINE) 1000 UNIT/ML IJ SOLN: 1000 UNIT/ML | INTRAMUSCULAR | Qty: 10

## 2015-07-26 MED FILL — LEVETIRACETAM 500 MG PO TABS: 500 MG | ORAL | Qty: 1

## 2015-07-26 MED FILL — SODIUM CHLORIDE 0.9 % IV SOLN: 0.9 % | INTRAVENOUS | Qty: 2000

## 2015-07-26 NOTE — Progress Notes (Signed)
Hospitalist ICU Progress Note    CC: Non-traumatic rhabdomyolysis    Hosp course: 29yo AAM with hx of siezures and drug abuse. Found down at home, likely overdose of heroin - was down for about 12 hours, then had severe rhabdo and severe compartment syndrome of R side - has fasciotomies of R leg and R arm. Pt CPK was initially 270K. Also had shock liver. CRRT started on 2/25, now getting HD. Has R IJ vascath. Will have TDC placed for iHD Has 3 incisions on legs. CPK trending down.      Admit date: 07/18/2015  Days in hospital:  8    24 Hour Events:  precert denied    Subjective: Pt S/E. No new complaints.       ROS:  A comprehensive review of systems was negative except for: difficult ROS due to failure to make eye contact and not talking  .    Objective:    Visit Vitals   ??? BP 130/60   ??? Pulse 93   ??? Temp 98.4 ??F (36.9 ??C) (Oral)   ??? Resp 13   ??? Ht  (1.88 m)   ??? Wt 177 lb 7.5 oz (80.5 kg)   ??? SpO2 97%   ??? BMI 22.79 kg/m2       Gen: NAD  HEENT: NC/AT, moist mucous membranes, no oropharyngeal erythema or exudate    Neck: supple, trachea midline, no anterior cervical or SC LAD  Heart:  Normal s1/s2, RRR, no murmurs, gallops, or rubs. R leg edema  Lungs:  diminished bilaterally, no wheeze, no rales, no rhonchi, no crackles, no use of accessory muscles  Abd: bowel sounds present, soft, nontender, nondistended, no masses  Extrem:  No clubbing, cyanosis,  R leg edema, peripheral pulses 2+, brisk capillary refill  Skin: no rashes or lesions, normal color/perfusion  Psych:  A & O x3    Neuro: grossly intact, moves all four extremities.        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  Acute rhabdo - improving with HD. CK down trending, 8139 today  2.  Acute renal failure - dialysis dependent for now - [facility] placed. Possible Tues-thurs- sat schedule  3.  Compartment syndrome of R arm and R leg - wound vac placed to leg - MWF changes  4.  Acute blood loss anemia - received 1u pRBC for hgb drop - blood loss likely from wound vac - stable  5.  Opiate abuse - pt snorts heroin apparently - on prn pi meds for wound care and pain    Stable for DC when Precert obtained.      Prognosis:  Fair    Code status:  FULL  DVT prophylaxis:  Lovenox   SQ Heparin   SCDs because wound bleeding  warfarin/oral direct thrombin inhibitor  Encourage ambulation    GI prophylaxis:  PPI/H2blocker   not indicated    Disposition  transfer to monitored bed    Medications:  Scheduled Meds:  ??? levETIRAcetam  500 mg Oral Q24H   ??? sodium chloride flush  10 mL Intravenous 2 times per day   ??? docusate sodium  100 mg Oral BID       PRN Meds:  heparin (porcine), fentanNYL, sodium chloride flush, acetaminophen, oxyCODONE-acetaminophen **OR** oxyCODONE-acetaminophen, ondansetron    IV:         Intake/Output Summary (Last 24 hours) at 07/26/15 0813  Last data filed at 07/26/15 0421   Gross per 24 hour   Intake             1430 ml   Output             2525 ml   Net            -1095 ml       Results:  CBC:   Recent Labs      07/24/15   0428  07/25/15   0343  07/26/15   0348   WBC  9.4  9.4  11.3*   HGB  8.6*  8.2*  8.4*   HCT  26.0*  24.6*  24.6*   MCV  91.5  90.2  90.2   PLT  95*  98*  104*     BMP:   Recent Labs      07/24/15   0428  07/25/15   0343  07/26/15   0348   NA  133*  133*  134*   K  5.7*  5.4*  5.7*   CL  100  99  101   CO2  20*  22  20*   PHOS  4.5  5.1*  5.6*   BUN  61*  50*  42*   CREATININE  8.0*  6.7*  5.4*     Mag: No results  for input(s): MAG in the last 72 hours.  LIVER PROFILE:   Recent Labs      07/24/15   0428  07/25/15   0343  07/26/15   0348   AST  332*  254*  189*   ALT  83*  71*  61*   BILIDIR  <0.2  <0.2  <0.2   BILITOT  0.3  0.3  0.5   ALKPHOS  55  47  44     PT/INR:   Recent Labs      07/23/15   0900   PROTIME  9.7   INR  0.86     APTT:   No results for input(s): APTT in the last 72 hours.  UA:No results for input(s): NITRITE, COLORU, PHUR, LABCAST, WBCUA, RBCUA, MUCUS, TRICHOMONAS, YEAST, BACTERIA, CLARITYU, SPECGRAV, LEUKOCYTESUR, UROBILINOGEN, BILIRUBINUR, BLOODU, GLUCOSEU, AMORPHOUS in the last 72 hours.    Invalid input(s): KETONESU    ABG:   Lab Results   Component Value Date    PHART 7.339 07/19/2015    PCO2ART 38.3 07/19/2015    PO2ART 238.0 07/19/2015    HCO3ART 20.1 07/19/2015    BEART -4.8 07/19/2015    TCO2ART 21.2 07/19/2015    O2SATART 99.6 07/19/2015       Lab Results   Component Value Date    CALCIUM 7.9 (L) 07/26/2015    PHOS 5.6 (H) 07/26/2015       Electronically signed by Brent GeneralAmy R Piers Baade, DO on 07/26/2015 at 8:13 AM

## 2015-07-26 NOTE — Progress Notes (Signed)
VASCULAR   POD#7  ????  Eating. VAC changed with significant pain yesterday.  ????  VSS afeb  Palpable R DP pulse  Thigh/calf soft  4/5 dorsiflexion R foot  VAC in place - no leak.  ????  A/P:   S/P R thigh and calf fasciotomies   Continue VAC with M-W-F changes.   Continue PT/OT with weight bearing as tolerated.   [facility]. P2[facility] in process. I support LTAC - unclear as to why it has been denied from my standpoint.  ????   Dolores HooseGreg Shantaya Bluestone

## 2015-07-26 NOTE — Progress Notes (Signed)
Nephrology Progress Note  [id number]  [phone]   [email or web address]    Patient:  Roy Weaver   DOB: 1986/01/19    CC:  AKI    Subjective:  Pt remains in the CVICU.   No issues with HD yesterday.    ROS:   No CP or SOB  No new c/o    SHx:  No visitors this morning.    Meds:  Current Facility-Administered Medications   Medication Dose Route Frequency Provider Last Rate Last Dose   ??? levETIRAcetam (KEPPRA) tablet 500 mg  500 mg Oral Q24H Markus Jarvis, MD   500 mg at 07/25/15 2330   ??? heparin (porcine) injection 3,000 Units  3,000 Units Intercatheter PRN Earl Gala, MD   3,200 Units at 07/26/15 0040   ??? fentaNYL (SUBLIMAZE) injection 50 mcg  50 mcg Intravenous Q4H PRN Loann Quill, NP   50 mcg at 07/25/15 1049   ??? sodium chloride flush 0.9 % injection 10 mL  10 mL Intravenous 2 times per day Filbert Berthold, MD   10 mL at 07/26/15 0847   ??? sodium chloride flush 0.9 % injection 10 mL  10 mL Intravenous PRN Filbert Berthold, MD       ??? acetaminophen (TYLENOL) tablet 650 mg  650 mg Oral Q4H PRN Filbert Berthold, MD   650 mg at 07/24/15 2146   ??? oxyCODONE-acetaminophen (PERCOCET) 5-325 MG per tablet 1 tablet  1 tablet Oral Q4H PRN Filbert Berthold, MD   1 tablet at 07/22/15 2130    Or   ??? oxyCODONE-acetaminophen (PERCOCET) 5-325 MG per tablet 2 tablet  2 tablet Oral Q4H PRN Filbert Berthold, MD   2 tablet at 07/26/15 0845   ??? docusate sodium (COLACE) capsule 100 mg  100 mg Oral BID Filbert Berthold, MD   100 mg at 07/26/15 0845   ??? ondansetron (ZOFRAN) injection 4 mg  4 mg Intravenous Q6H PRN Filbert Berthold, MD           Vitals:  Visit Vitals   ??? BP 130/63   ??? Pulse 98   ??? Temp 98.4 ??F (36.9 ??C) (Oral)   ??? Resp 15   ??? Ht  (1.88 m)   ??? Wt 177 lb 7.5 oz (80.5 kg)   ??? SpO2 97%   ??? BMI 22.79 kg/m2       Physical Exam:  Gen: Resting in bed, NAD.  In CVICU.  HEENT: MMM, OP clear.  CV: RRR no m/r/g.  No S3.  Lungs: CTA-B  Abd: S/NT +BS  Ext: RLE fasciotomy with woundVAC.  Skin: Warm.  No rashes appreciated.  Access: Right IJ  tunneled HD catheter c/d/i.    Labs:  CBC:   Lab Results   Component Value Date    WBC 11.3 07/26/2015    RBC 2.73 07/26/2015    HGB 8.4 07/26/2015    HCT 24.6 07/26/2015    MCV 90.2 07/26/2015    MCH 30.7 07/26/2015    MCHC 34.0 07/26/2015    RDW 15.1 07/26/2015    PLT 104 07/26/2015    MPV 9.3 07/26/2015     BMP:    Lab Results   Component Value Date    NA 134 07/26/2015    K 5.7 07/26/2015    CL 101 07/26/2015    CO2 20 07/26/2015    BUN 42 07/26/2015    LABALBU 2.1 07/26/2015    CREATININE 5.4 07/26/2015  CREATININE 1.0 08/17/2011    CALCIUM 7.9 07/26/2015    GFRAA 15 07/26/2015    GFRAA 157 08/19/2011    LABGLOM 13 07/26/2015    GLUCOSE 108 07/26/2015       Assessment/Plan:    AKI: due to rhabdomyolysis-initiated on CRRT due to hyperkalemia CRRT stopped 2/27  -hemodynamically stable  -TDC placed, plan for HD  on a MWF schedule.  -[facility]  ??    Rhabdomyolysis: With compartment syndrome S/P fasciotomies CK is improving.  ??  Transamnitis: ischemic injury, improving  ??  Anemia s/p PRBC, hgb in the 8's now.  ??  Compartment syndrome right: s/p fasciotomy  -per orthopedic surgery and Dr. Yolanda MangesZenni  ??  Polysubstance abuse  ??  Venita LickStanca Zoriah Pulice, MD

## 2015-07-26 NOTE — Plan of Care (Signed)
Problem: Falls - Risk of  Goal: Absence of falls  Outcome: Ongoing  No falls occurred this shift. Morse falls score 60. Fall risk precautions in place. Pt in bed. Bed low and locked. Side rails up x3. Bed/chair alarm activated. Sound com dome light on. Armband on. Bedside table & Call light within reach. Hourly visual monitoring in place. Pt understands to call RN for assistance when needed. Will continue to monitor.         Problem: Risk for Impaired Skin Integrity  Goal: Tissue integrity - skin and mucous membranes  Structural intactness and normal physiological function of skin and  mucous membranes.   Outcome: Ongoing  No new skin breakdown noted. Will monitor.         Problem: Infection - Central Venous Catheter-Associated Bloodstream Infection:  Goal: Will show no infection signs and symptoms  Will show no infection signs and symptoms   Outcome: Ongoing  No s/s of infection noted. VS stable on room air. Surgical sites on R legs are covered with black foam dressing and R forearm incision is covered with wet to dry dressing. Will continue to monitor.         Problem: Pain:  Goal: Pain level will decrease  Pain level will decrease   Outcome: Ongoing  Pt able to communicate pain by using numerical scale. Pharmacological and non-pharmacological intervention done. Will continue to monitor.         Comments:   Electronically signed by Bryn GullingAnal M Linah Klapper, RN on 07/26/2015 at 3:54 AM

## 2015-07-26 NOTE — Progress Notes (Signed)
1400: assessment as noted. Pt alert to voice, oriented x4. No acute distress. No complaints. Pt appears depressed with flat affect. Comfortable in bed at this time. Will continue to monitor.     1600: assessment as noted. Pt unhappy that unit is out of apple juice at this time - states he needs it since he is about to urinate and will then be thirsty. Informed pt that RN would order more juice, but that it would have to be delivered. Also educated pt on importance of eating food and drinking his supplements. He v/u. Pt comfortable in bed, will monitor.     1635: wound vac alarming that canister is empty. There is output noted in canister, then there are bubbles on top of the fluid. Canister changed, output documented.

## 2015-07-27 ENCOUNTER — Inpatient Hospital Stay: Admit: 2015-07-27 | Primary: Internal Medicine

## 2015-07-27 LAB — CBC WITH AUTO DIFFERENTIAL
Basophils %: 0.3 %
Basophils Absolute: 0 10*3/uL (ref 0.0–0.2)
Eosinophils %: 2.9 %
Eosinophils Absolute: 0.4 10*3/uL (ref 0.0–0.6)
Hematocrit: 24.1 % — ABNORMAL LOW (ref 40.5–52.5)
Hemoglobin: 8.1 g/dL — ABNORMAL LOW (ref 13.5–17.5)
Lymphocytes %: 10.2 %
Lymphocytes Absolute: 1.3 10*3/uL (ref 1.0–5.1)
MCH: 30.2 pg (ref 26.0–34.0)
MCHC: 33.5 g/dL (ref 31.0–36.0)
MCV: 90.1 fL (ref 80.0–100.0)
MPV: 9.2 fL (ref 5.0–10.5)
Monocytes %: 15.2 %
Monocytes Absolute: 2 10*3/uL — ABNORMAL HIGH (ref 0.0–1.3)
Neutrophils %: 71.4 %
Neutrophils Absolute: 9.3 10*3/uL — ABNORMAL HIGH (ref 1.7–7.7)
Platelets: 137 10*3/uL (ref 135–450)
RBC: 2.67 M/uL — ABNORMAL LOW (ref 4.20–5.90)
RDW: 15.9 % — ABNORMAL HIGH (ref 12.4–15.4)
WBC: 13.1 10*3/uL — ABNORMAL HIGH (ref 4.0–11.0)

## 2015-07-27 LAB — HEPATIC FUNCTION PANEL
ALT: 51 U/L — ABNORMAL HIGH (ref 10–40)
AST: 138 U/L — ABNORMAL HIGH (ref 15–37)
Albumin: 2.2 g/dL — ABNORMAL LOW (ref 3.4–5.0)
Alkaline Phosphatase: 45 U/L (ref 40–129)
Bilirubin, Direct: <0.2 mg/dL (ref 0.0–0.3)
Total Bilirubin: 0.3 mg/dL (ref 0.0–1.0)
Total Protein: 4.9 g/dL — ABNORMAL LOW (ref 6.4–8.2)

## 2015-07-27 LAB — BASIC METABOLIC PANEL
Anion Gap: 17 — ABNORMAL HIGH (ref 3–16)
BUN: 80 mg/dL — ABNORMAL HIGH (ref 7–20)
CO2: 20 mmol/L — ABNORMAL LOW (ref 21–32)
Calcium: 8 mg/dL — ABNORMAL LOW (ref 8.3–10.6)
Chloride: 92 mmol/L — ABNORMAL LOW (ref 99–110)
Creatinine: 8.4 mg/dL (ref 0.9–1.3)
GFR African American: 9 — AB (ref >60)
GFR Non-African American: 8 — AB (ref >60)
Glucose: 113 mg/dL — ABNORMAL HIGH (ref 70–99)
Potassium: 7.1 mmol/L (ref 3.5–5.1)
Sodium: 129 mmol/L — ABNORMAL LOW (ref 136–145)

## 2015-07-27 LAB — MAGNESIUM: Magnesium: 2.9 mg/dL — ABNORMAL HIGH (ref 1.80–2.40)

## 2015-07-27 LAB — PHOSPHORUS: Phosphorus: 9.1 mg/dL — ABNORMAL HIGH (ref 2.5–4.9)

## 2015-07-27 LAB — CK: Total CK: 6142 U/L — ABNORMAL HIGH (ref 39–308)

## 2015-07-27 MED ORDER — CALCIUM GLUCONATE 10 % IV SOLN
10 % | Freq: Once | INTRAVENOUS | Status: AC
Start: 2015-07-27 — End: 2015-07-27
  Administered 2015-07-27: 13:00:00 1 g via INTRAVENOUS

## 2015-07-27 MED ORDER — MEGESTROL ACETATE 40 MG/ML PO SUSP
40 MG/ML | Freq: Every day | ORAL | Status: DC
Start: 2015-07-27 — End: 2015-07-29
  Administered 2015-07-28: 20:00:00 200 mg via ORAL

## 2015-07-27 MED ORDER — SODIUM CHLORIDE 0.9 % IV SOLN
0.9 % | INTRAVENOUS | Status: AC
Start: 2015-07-27 — End: 2015-07-27
  Administered 2015-07-27: 14:00:00 1000

## 2015-07-27 MED ORDER — INSULIN REGULAR HUMAN 100 UNIT/ML IJ SOLN
100 UNIT/ML | Freq: Once | INTRAMUSCULAR | Status: AC
Start: 2015-07-27 — End: 2015-07-27
  Administered 2015-07-27: 13:00:00 10 [IU] via INTRAVENOUS

## 2015-07-27 MED ORDER — SODIUM BICARBONATE 8.4 % IV SOLN
8.4 % | Freq: Once | INTRAVENOUS | Status: AC
Start: 2015-07-27 — End: 2015-07-27
  Administered 2015-07-27: 13:00:00 50 meq via INTRAVENOUS

## 2015-07-27 MED FILL — DOK 100 MG PO CAPS: 100 MG | ORAL | Qty: 1

## 2015-07-27 MED FILL — ONDANSETRON HCL 4 MG/2ML IJ SOLN: 4 MG/2ML | INTRAMUSCULAR | Qty: 2

## 2015-07-27 MED FILL — HUMULIN R 100 UNIT/ML IJ SOLN: 100 UNIT/ML | INTRAMUSCULAR | Qty: 0.1

## 2015-07-27 MED FILL — LEVETIRACETAM 500 MG PO TABS: 500 MG | ORAL | Qty: 1

## 2015-07-27 MED FILL — FENTANYL CITRATE (PF) 100 MCG/2ML IJ SOLN: 100 MCG/2ML | INTRAMUSCULAR | Qty: 2

## 2015-07-27 MED FILL — SODIUM CHLORIDE 0.9 % IV SOLN: 0.9 % | INTRAVENOUS | Qty: 2000

## 2015-07-27 MED FILL — OXYCODONE-ACETAMINOPHEN 5-325 MG PO TABS: 5-325 MG | ORAL | Qty: 2

## 2015-07-27 MED FILL — SODIUM BICARBONATE 8.4 % IV SOLN: 8.4 % | INTRAVENOUS | Qty: 50

## 2015-07-27 MED FILL — CALCIUM GLUCONATE 10 % IV SOLN: 10 % | INTRAVENOUS | Qty: 10

## 2015-07-27 MED FILL — HEPARIN SODIUM (PORCINE) 1000 UNIT/ML IJ SOLN: 1000 UNIT/ML | INTRAMUSCULAR | Qty: 10

## 2015-07-27 NOTE — Progress Notes (Signed)
2235: dressing change completed. Damp to dry dressing. Kerlix wrapped on entire right leg with chuck pads underneath for drainage. Pt did not tolerate well and was screaming throughout the process.    2300: At this time pt is resting comfortably. He states he is still in pain but it is better. I offered him pain meds as soon as they were due just before midnight. Will monitor for need at that time.

## 2015-07-27 NOTE — Progress Notes (Addendum)
1925 Report received from [place], [place]. Wound vac continuing to alarm. [facility]ay    2015: Shay returned call and I notified her the wound vac was still alarming. Instructed to take vac off and start a wet to dry dressing. Pt immediately given percocet (see Mar). Will give fentanyl once dressing change is started.

## 2015-07-27 NOTE — Progress Notes (Signed)
VASCULAR     POD#8  ????  Eating. VAC changed with significant pain yesterday. Not very conversant - has a "faraway look".  ????  VSS afeb  Palpable R DP pulse  Thigh/calf soft  4-5/5 dorsiflexion R foot - improved  VAC in place - no leak but sponges are soft  ????  A/P:   S/P R thigh and calf fasciotomies   Continue VAC with M-W-F changes. Will need to change to multiple canisters to suck adequately.   Continue PT/OT with weight bearing as tolerated.   [facility] P2P in process. I support LTAC - unclear as to why it has been denied from my standpoint.  ????   Dolores HooseGreg Leanard Dimaio

## 2015-07-27 NOTE — Progress Notes (Signed)
0745: RN left message for dietician regarding further education on renal diet, including juice that has potassium in it. As pt only likes to drink juice and not eat or drink Nepro shakes.     0800: assessment as noted. Pt in bed with eyes closed, opens them randomly to look around room. Responds to RN questions, however mumbles at times and gets frustrated that RN cannot understand him. He is oriented x4. Wound vac running with no issues at this time. Breakfast tray is bedside, RN informed pt that his meal was here, pt did not turn head towards tray. Pt educated on medications given at this time (insulin, calcium and sodium bicarb) and their purpose (help lower potassium which is critically high). Also informed pt that he would be receiving HD today. Pt comfortable in dolphin bed when RN left room. Will continue to monitor. Attending physician rounds complete - see orders.     0820: CXR performed. HD nurse arrived for emergent HD run - report given, including meds that pt has or is receiving at this time.     1650: assessment as noted.     1715: wound vac alarming. RN noted that foam was not sucked down. RN noted that disc was covered with Tegaderm. This RN was able to replace the covered disc with a new one. As of 1800, foam is sucking down, negative pressure slowly increasing to goal of 175. There has been ~37900mL of output during while vac is trying to reach goal pressure.     1830: pt comfortable in bed with eyes closed.     1845: RN called supervisor to see if there was a wound care resource that could be contacted regarding wound vac issue. RN was informed by supervisor that it was OK to call wound care nurse.     1855: spoke with Shay from wound care, received suggestions. RN wiped dressing with all-kare skin prep pads; also reinforced areas where RN thought there was a leak with wound vac drape.     1920: wound vac still unable to keep pressure, even with reducing target to 125 (OK per Shay).    1925: report  given, handoff completed bedside.     Electronically signed by Anise SalvoEmily D McQueen, BSN, RN, CCRN on 07/27/2015 at 7:28 PM

## 2015-07-27 NOTE — Progress Notes (Signed)
Alarm on wound vac saying clogged line. No kinks in tubing or clamped lines. Waiting for supplies from [employer] to do complete dressing change. Fentanyl ordered for dressing changes. Will administer prior.

## 2015-07-27 NOTE — Plan of Care (Signed)
Problem: Falls - Risk of  Intervention: Fall risk assessment  Morse score = 75  Risks: generalized weakness; wound vac; unfamiliar environment; PRN narcotics; CVU monitor wires; vas cath; impaired mobility; pt unaware of limitations  Intervention: Fall precautions  Bed locked and in low position. Bed alarm on.  Call light, bedside table and personal belongings within reach.  Room uncluttered.  Non-skid footwear used for out of bed, however pt has not been out of bed yet.  Falling star light on, fall bracelet on.   Pt educated on fall precautions.         Goal: Absence of falls  Outcome: Ongoing  No falls since admission    Problem: Risk for Impaired Skin Integrity  Goal: Tissue integrity - skin and mucous membranes  Structural intactness and normal physiological function of skin and  mucous membranes.   Outcome: Ongoing  Skin and mucous membranes intact except as noted  Pt with 4 fasciotomies, wound vac to 3 of them, w/d dressing to the 4th  Intervention: SKIN ASSESSMENT  Skin assessment as noted  Intervention: PRESSURE ULCER PREVENTION  Pt is on a dolphin specialty mattress  Bilateral heel protectors are in place  Pillow support  Intervention: TURN PATIENT  Pt is turned/repositioned q2 hours  Pt occasionally refuses - educated at those times about importance      Problem: Infection - Central Venous Catheter-Associated Bloodstream Infection:  Goal: Will show no infection signs and symptoms  Will show no infection signs and symptoms   Outcome: Ongoing  No s/s of infection at vas cath site    Problem: Pain:  Goal: Pain level will decrease  Pain level will decrease   Outcome: Ongoing  Pt has received pain medication x1 this shift    Problem: Infection - Surgical Site:  Intervention: Provide incision dressing management  Dressing on arm is changed daily  Wound vacs on RLE are changed q M/W/F      Problem: Nutrition  Goal: Optimal nutrition therapy  Nutrition Problem: Increased nutrient needs  Intervention: Food and/or  Nutrient Delivery: Continue current diet, Modify current ONS (change supplement to Ensure Clear bid)  Nutritional Goals: Pt to tolerate & consume >/=50% of meals & ONS   Outcome: Ongoing  Pt did not eat breakfast  Pt ate about 75% of lunch  Pt drank ensure clear supplement that was on lunch tray

## 2015-07-27 NOTE — Progress Notes (Signed)
Hospitalist ICU Progress Note    CC: Non-traumatic rhabdomyolysis    Hosp course: 30yo AAM with hx of seizures and drug abuse. Found down at home, likely overdose of heroin - was down for about 12 hours, then had severe rhabdo and severe compartment syndrome of R side - has fasciotomies of R leg and R arm. Pt CPK was initially 270K. Also had shock liver. CRRT started on 2/25, now getting HD. Has R IJ vascath. Had tunneled HD catheter placed on 3/1. Has 3 incisions on legs. CPK trending down.   Unclear why LTAC denied as pt has new HD plus large wounds requiring wound vac.    Admit date: 07/18/2015  Days in hospital:  9    24 Hour Events: pt refusing to eat    Subjective: does not make eye contact, will only drink apple juice or grape juice - will not drink nepro either    ROS:  Review of systems not obtained due to patient factors. pt won't respond.    Objective:    Visit Vitals   ??? BP 135/69   ??? Pulse 93   ??? Temp 98.7 ??F (37.1 ??C) (Oral)   ??? Resp 14   ??? Ht 6\' 2"  (1.88 m)   ??? Wt 177 lb 7.5 oz (80.5 kg)   ??? SpO2 98%   ??? BMI 22.79 kg/m2       Gen: NAD, thin  HEENT: NC/AT, moist mucous membranes, no oropharyngeal erythema or exudate    Neck: supple, trachea midline, no anterior cervical or SC LAD  Heart:  Normal s1/s2, RRR, no murmurs, gallops, or rubs. no leg edema  Lungs:  Mostly clear bilaterally, no wheeze, no rales, no rhonchi, no crackles, no use of accessory muscles  Abd: bowel sounds present, soft, nontender, nondistended, no masses  Extrem:  No clubbing, cyanosis,  no edema, peripheral pulses 2+, brisk capillary refill  Skin: no rashes or lesions, normal color/perfusion  Psych:  Alert, but  Unable to fully assess due to Patient refused exam.   Neuro: grossly intact, moves all four extremities.           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  Hyperkalemia - likely due to noncompliance with diet  2.  Acute renal failure - remains on HD - unclear if this will improve or not - may take time  3.  Acute rhabdomyolysis - due to overdose - caused renal failure - CPK down to 6000  4.  Compartment syndrome of R arm and R leg - had emergent fasciotomies and has wound vacs to lower extremity  5.  Severe prot cal malnutrition - start megace to encourage appetite  6.  Depression - pt refusing to eat  - will have psych consult for obvious depression and need for further counseling and start megace  7.  Acute blood loss anemia - from fasciotomy wounds in leg - holding heparin -  SCDs - hgb holding at present  8.  Thrombocytopenia - improving at present time  9.  Leukocytosis - unclear why WBC Increasing - will check CXR and U/A if able to make urine        Prognosis:  Good    Code status:  FULL  DVT prophylaxis:  Lovenox   SQ Heparin   SCDs because wound site bleeding  warfarin/oral direct thrombin inhibitor  Encourage ambulation    GI prophylaxis:  PPI/H2blocker   not indicated    Disposition  transfer to monitored bed    Medications:  Scheduled Meds:  ??? calcium gluconate IVPB  1 g Intravenous Once   ??? sodium bicarbonate  50 mEq Intravenous Once   ??? insulin regular  10 Units Intravenous Once   ??? levETIRAcetam  500 mg Oral Q24H   ??? sodium chloride flush  10 mL Intravenous 2 times per day   ??? docusate sodium  100 mg Oral BID       PRN Meds:  heparin (porcine), fentanNYL, sodium chloride flush, acetaminophen, oxyCODONE-acetaminophen **OR** oxyCODONE-acetaminophen, ondansetron    IV:          Intake/Output Summary (Last 24 hours) at 07/27/15 0751  Last data filed at 07/27/15 0157   Gross per 24 hour   Intake             1320 ml   Output             1450 ml   Net             -130 ml       Results:  CBC: Recent Labs      07/25/15   0343  07/26/15   0348  07/27/15   0416   WBC  9.4  11.3*  13.1*   HGB  8.2*  8.4*  8.1*   HCT  24.6*  24.6*  24.1*   MCV  90.2  90.2  90.1   PLT  98*  104*  137     BMP: Recent Labs      07/25/15   0343  07/26/15   0348  07/27/15   0416   NA  133*  134*  129*   K  5.4*  5.7*  7.1*   CL  99  101  92*   CO2  22  20*  20*   PHOS  5.1*  5.6*  9.1*   BUN  50*  42*  80*   CREATININE  6.7*  5.4*  8.4*     Mag: No results for input(s): MAG in the last 72 hours.  LIVER PROFILE:   Recent Labs      07/25/15   0343  07/26/15   0348  07/27/15   0416   AST  254*  189*  138*   ALT  71*  61*  51*   BILIDIR  <0.2  <0.2  <0.2   BILITOT  0.3  0.5  0.3   ALKPHOS  47  44  45     PT/INR: No results for input(s): PROTIME, INR in the last 72 hours.  APTT: No results for input(s): APTT in the last 72 hours.  UA:No results for input(s): NITRITE, COLORU, PHUR, LABCAST, WBCUA, RBCUA, MUCUS, TRICHOMONAS, YEAST, BACTERIA, CLARITYU, SPECGRAV, LEUKOCYTESUR, UROBILINOGEN, BILIRUBINUR, BLOODU, GLUCOSEU, AMORPHOUS in the last 72 hours.    Invalid input(s): KETONESU    ABG: Lab Results   Component Value Date    PHART 7.339 07/19/2015  PCO2ART 38.3 07/19/2015    PO2ART 238.0 07/19/2015    HCO3ART 20.1 07/19/2015    BEART -4.8 07/19/2015    TCO2ART 21.2 07/19/2015    O2SATART 99.6 07/19/2015       Lab Results   Component Value Date    CALCIUM 8.0 (L) 07/27/2015    PHOS 9.1 (H) 07/27/2015             Electronically signed by Markus Jarvis, MD on 07/27/2015 at 7:51 AM

## 2015-07-27 NOTE — Progress Notes (Addendum)
Called by nurse for hyperkalemia  Chart reviewed  AKI with rhabdomyolysis, oligoanuric  Initiated on HD and is on MWF schedule  Insulin/D50 and 1 amp of bicarb ordered  Emergent HD ordered  HD nurse aware

## 2015-07-27 NOTE — Progress Notes (Signed)
RN able to reseal dressing and reposition that allowed wound vac to work properly again. Another clear dressing added but did not have to do dressing change.

## 2015-07-27 NOTE — Progress Notes (Signed)
Starting wet to dry dressing change. Medicated with fentanyl

## 2015-07-27 NOTE — Progress Notes (Addendum)
Nephrology Progress Note  [phone]  [phone]   [email or web address]    Patient:  Roy Weaver   DOB: [birth year]    CC:  AKI    Subjective:  Pt remains in the CVICU, waiting for transfer out.     Had HD earlier today, well tolerated    ROS:   No CP or SOB  No new c/o    SHx:  No visitors     Meds:  Current Facility-Administered Medications   Medication Dose Route Frequency Provider Last Rate Last Dose   ??? megestrol (MEGACE) 40 MG/ML suspension 200 mg  200 mg Oral Daily Markus Jarvis, MD       ??? levETIRAcetam (KEPPRA) tablet 500 mg  500 mg Oral Q24H Markus Jarvis, MD   500 mg at 07/26/15 2220   ??? heparin (porcine) injection 3,000 Units  3,000 Units Intercatheter PRN Earl Gala, MD   3,000 Units at 07/27/15 1242   ??? fentaNYL (SUBLIMAZE) injection 50 mcg  50 mcg Intravenous Q4H PRN Loann Quill, NP   50 mcg at 07/27/15 0559   ??? sodium chloride flush 0.9 % injection 10 mL  10 mL Intravenous 2 times per day Filbert Berthold, MD   10 mL at 07/26/15 2221   ??? sodium chloride flush 0.9 % injection 10 mL  10 mL Intravenous PRN Filbert Berthold, MD       ??? acetaminophen (TYLENOL) tablet 650 mg  650 mg Oral Q4H PRN Filbert Berthold, MD   650 mg at 07/24/15 2146   ??? oxyCODONE-acetaminophen (PERCOCET) 5-325 MG per tablet 1 tablet  1 tablet Oral Q4H PRN Filbert Berthold, MD   1 tablet at 07/22/15 2130    Or   ??? oxyCODONE-acetaminophen (PERCOCET) 5-325 MG per tablet 2 tablet  2 tablet Oral Q4H PRN Filbert Berthold, MD   2 tablet at 07/26/15 2305   ??? docusate sodium (COLACE) capsule 100 mg  100 mg Oral BID Filbert Berthold, MD   100 mg at 07/26/15 2220   ??? ondansetron (ZOFRAN) injection 4 mg  4 mg Intravenous Q6H PRN Filbert Berthold, MD   4 mg at 07/26/15 2312       Vitals:  Visit Vitals   ??? BP 118/67   ??? Pulse 100   ??? Temp 98 ??F (36.7 ??C)   ??? Resp 12   ??? Ht  (1.88 m)   ??? Wt 178 lb 2.1 oz (80.8 kg)   ??? SpO2 98%   ??? BMI 22.87 kg/m2       Physical Exam:  Gen: Resting in bed, NAD.  HEENT: MMM, OP clear.  CV: RRR no m/r/g.  No  S3.  Lungs: CTA-B, normal respiratory effort  Abd: S/NT +BS, no mass  Ext: RLE fasciotomy with woundVAC.  Skin: Warm.  No rashes appreciated.  Access: Right IJ tunneled HD catheter c/d/i.    Labs:  CBC:   Lab Results   Component Value Date    WBC 13.1 07/27/2015    RBC 2.67 07/27/2015    HGB 8.1 07/27/2015    HCT 24.1 07/27/2015    MCV 90.1 07/27/2015    MCH 30.2 07/27/2015    MCHC 33.5 07/27/2015    RDW 15.9 07/27/2015    PLT 137 07/27/2015    MPV 9.2 07/27/2015     BMP:    Lab Results   Component Value Date    NA 129 07/27/2015    K  7.1 07/27/2015    CL 92 07/27/2015    CO2 20 07/27/2015    BUN 80 07/27/2015    LABALBU 2.2 07/27/2015    CREATININE 8.4 07/27/2015    CREATININE 1.0 08/17/2011    CALCIUM 8.0 07/27/2015    GFRAA 9 07/27/2015    GFRAA 157 08/19/2011    LABGLOM 8 07/27/2015    GLUCOSE 113 07/27/2015       Assessment/Plan:    AKI: due to rhabdomyolysis-initiated on CRRT due to hyperkalemia CRRT stopped 2/27  -hemodynamically stable, now on intermittent HD  -access:TDC   - plan was for MWF schedule; had emergent HD today for hyperkalemia; will schedule again for tomorrow , to keep MWF schedule  -Watch for signs of renal recovery   ??    Rhabdomyolysis: With compartment syndrome S/P fasciotomies   ??- CPK trending down  ??  Anemia s/p PRBC transfusion; hgb in the 8's now.  ??  Compartment syndrome right: s/p fasciotomy  -per orthopedic surgery and Dr. Yolanda MangesZenni  ??  Polysubstance abuse    HYPERKALEMIA- required emergent HD today    ??  Venita LickStanca Karaline Buresh, MD

## 2015-07-27 NOTE — Other (Signed)
07/27/15 0850 07/27/15 1227   Observations & Evaluations   Pulse 95 100   Vital Signs   BP 139/70 118/67   Temp 98.8 ??F (37.1 ??C) 98 ??F (36.7 ??C)   Weight 178 lb 9.2 oz (81 kg) 178 lb 2.1 oz (80.8 kg)   Tolerated tx well, kept even. No meds given. Copy of record in chart

## 2015-07-28 LAB — BASIC METABOLIC PANEL
Anion Gap: 16 (ref 3–16)
BUN: 67 mg/dL — ABNORMAL HIGH (ref 7–20)
CO2: 21 mmol/L (ref 21–32)
Calcium: 7.9 mg/dL — ABNORMAL LOW (ref 8.3–10.6)
Chloride: 97 mmol/L — ABNORMAL LOW (ref 99–110)
Creatinine: 7.5 mg/dL (ref 0.9–1.3)
GFR African American: 11 — AB (ref >60)
GFR Non-African American: 9 — AB (ref >60)
Glucose: 115 mg/dL — ABNORMAL HIGH (ref 70–99)
Potassium: 6.7 mmol/L (ref 3.5–5.1)
Sodium: 134 mmol/L — ABNORMAL LOW (ref 136–145)

## 2015-07-28 LAB — CBC WITH AUTO DIFFERENTIAL
Basophils %: 0.5 %
Basophils Absolute: 0.1 10*3/uL (ref 0.0–0.2)
Eosinophils %: 3 %
Eosinophils Absolute: 0.3 10*3/uL (ref 0.0–0.6)
Hematocrit: 22.5 % — ABNORMAL LOW (ref 40.5–52.5)
Hemoglobin: 7.3 g/dL — ABNORMAL LOW (ref 13.5–17.5)
Lymphocytes %: 13.1 %
Lymphocytes Absolute: 1.5 10*3/uL (ref 1.0–5.1)
MCH: 30 pg (ref 26.0–34.0)
MCHC: 32.4 g/dL (ref 31.0–36.0)
MCV: 92.5 fL (ref 80.0–100.0)
MPV: 9.5 fL (ref 5.0–10.5)
Monocytes %: 13.5 %
Monocytes Absolute: 1.6 10*3/uL — ABNORMAL HIGH (ref 0.0–1.3)
Neutrophils %: 69.9 %
Neutrophils Absolute: 8.1 10*3/uL — ABNORMAL HIGH (ref 1.7–7.7)
Platelets: 158 10*3/uL (ref 135–450)
RBC: 2.43 M/uL — ABNORMAL LOW (ref 4.20–5.90)
RDW: 16.9 % — ABNORMAL HIGH (ref 12.4–15.4)
WBC: 11.6 10*3/uL — ABNORMAL HIGH (ref 4.0–11.0)

## 2015-07-28 LAB — PREPARE RBC (CROSSMATCH): Dispense Status Blood Bank: TRANSFUSED

## 2015-07-28 LAB — HEPATIC FUNCTION PANEL
ALT: 49 U/L — ABNORMAL HIGH (ref 10–40)
AST: 117 U/L — ABNORMAL HIGH (ref 15–37)
Albumin: 2.1 g/dL — ABNORMAL LOW (ref 3.4–5.0)
Alkaline Phosphatase: 44 U/L (ref 40–129)
Bilirubin, Direct: <0.2 mg/dL (ref 0.0–0.3)
Total Bilirubin: 0.3 mg/dL (ref 0.0–1.0)
Total Protein: 4.9 g/dL — ABNORMAL LOW (ref 6.4–8.2)

## 2015-07-28 LAB — TYPE AND SCREEN
ABO/Rh: O POS
Antibody Screen: NEGATIVE

## 2015-07-28 LAB — MAGNESIUM: Magnesium: 2.5 mg/dL — ABNORMAL HIGH (ref 1.80–2.40)

## 2015-07-28 LAB — CK: Total CK: 3651 U/L — ABNORMAL HIGH (ref 39–308)

## 2015-07-28 LAB — HEPATITIS B SURFACE ANTIBODY: Hep B S Ab: 489.9 m[IU]/mL

## 2015-07-28 LAB — PHOSPHORUS: Phosphorus: 7.6 mg/dL — ABNORMAL HIGH (ref 2.5–4.9)

## 2015-07-28 LAB — HEPATITIS B SURFACE ANTIGEN: Hep B S Ag Interp: NONREACTIVE

## 2015-07-28 MED ORDER — SODIUM CHLORIDE 0.9 % IV BOLUS
0.9 % | Freq: Once | INTRAVENOUS | Status: AC
Start: 2015-07-28 — End: 2015-07-29
  Administered 2015-07-28: 250 mL via INTRAVENOUS

## 2015-07-28 MED ORDER — INSULIN REGULAR HUMAN 100 UNIT/ML IJ SOLN
100 UNIT/ML | Freq: Once | INTRAMUSCULAR | Status: AC
Start: 2015-07-28 — End: 2015-07-28
  Administered 2015-07-28: 12:00:00 10 [IU] via INTRAVENOUS

## 2015-07-28 MED ORDER — DEXTROSE 50 % IV SOLN
50 % | Freq: Once | INTRAVENOUS | Status: AC
Start: 2015-07-28 — End: 2015-07-28
  Administered 2015-07-28: 12:00:00 25 g via INTRAVENOUS

## 2015-07-28 MED ORDER — DEXTROSE 5 % IV SOLN
5 % | Freq: Once | INTRAVENOUS | Status: AC
Start: 2015-07-28 — End: 2015-07-28
  Administered 2015-07-28: 12:00:00 1 g via INTRAVENOUS

## 2015-07-28 MED ORDER — MEGESTROL ACETATE 40 MG/ML PO SUSP
40 MG/ML | Freq: Every day | ORAL | 3 refills | Status: DC
Start: 2015-07-28 — End: 2016-03-17

## 2015-07-28 MED ORDER — SODIUM BICARBONATE 8.4 % IV SOLN
8.4 % | Freq: Once | INTRAVENOUS | Status: DC
Start: 2015-07-28 — End: 2015-07-28

## 2015-07-28 MED ORDER — ONDANSETRON HCL 4 MG/2ML IJ SOLN
4 MG/2ML | Freq: Four times a day (QID) | INTRAMUSCULAR | Status: DC | PRN
Start: 2015-07-28 — End: 2015-10-13

## 2015-07-28 MED ORDER — NORMAL SALINE FLUSH 0.9 % IV SOLN
0.9 % | Freq: Two times a day (BID) | INTRAVENOUS | Status: DC
Start: 2015-07-28 — End: 2015-10-13

## 2015-07-28 MED ORDER — GLUCAGON HCL RDNA (DIAGNOSTIC) 1 MG IJ SOLR
1 MG | INTRAMUSCULAR | Status: DC | PRN
Start: 2015-07-28 — End: 2015-07-29

## 2015-07-28 MED ORDER — NORMAL SALINE FLUSH 0.9 % IV SOLN
0.9 % | INTRAVENOUS | Status: DC | PRN
Start: 2015-07-28 — End: 2015-10-13

## 2015-07-28 MED ORDER — OXYCODONE-ACETAMINOPHEN 5-325 MG PO TABS
5-325 MG | ORAL_TABLET | ORAL | 0 refills | Status: DC
Start: 2015-07-28 — End: 2015-10-13

## 2015-07-28 MED ORDER — FENTANYL CITRATE (PF) 100 MCG/2ML IJ SOLN
100 MCG/2ML | INTRAMUSCULAR | Status: DC | PRN
Start: 2015-07-28 — End: 2015-10-13

## 2015-07-28 MED ORDER — SODIUM CHLORIDE 0.9 % IV SOLN
0.9 % | INTRAVENOUS | Status: AC
Start: 2015-07-28 — End: 2015-07-28

## 2015-07-28 MED ORDER — DEXTROSE 50 % IV SOLN
50 % | INTRAVENOUS | Status: DC | PRN
Start: 2015-07-28 — End: 2015-07-29

## 2015-07-28 MED ORDER — GLUCOSE 40 % PO GEL
40 % | ORAL | Status: DC | PRN
Start: 2015-07-28 — End: 2015-07-29

## 2015-07-28 MED ORDER — DEXTROSE 5 % IV SOLN
5 % | INTRAVENOUS | Status: DC | PRN
Start: 2015-07-28 — End: 2015-07-29

## 2015-07-28 MED FILL — OXYCODONE-ACETAMINOPHEN 5-325 MG PO TABS: 5-325 MG | ORAL | Qty: 2

## 2015-07-28 MED FILL — LEVETIRACETAM 500 MG PO TABS: 500 MG | ORAL | Qty: 1

## 2015-07-28 MED FILL — FENTANYL CITRATE (PF) 100 MCG/2ML IJ SOLN: 100 MCG/2ML | INTRAMUSCULAR | Qty: 2

## 2015-07-28 MED FILL — DOK 100 MG PO CAPS: 100 MG | ORAL | Qty: 1

## 2015-07-28 MED FILL — SODIUM CHLORIDE 0.9 % IV SOLN: 0.9 % | INTRAVENOUS | Qty: 250

## 2015-07-28 MED FILL — DEXTROSE 50 % IV SOLN: 50 % | INTRAVENOUS | Qty: 50

## 2015-07-28 MED FILL — SODIUM CHLORIDE 0.9 % IV SOLN: 0.9 % | INTRAVENOUS | Qty: 2000

## 2015-07-28 MED FILL — CALCIUM GLUCONATE 10 % IV SOLN: 10 % | INTRAVENOUS | Qty: 10

## 2015-07-28 MED FILL — NORMAL SALINE FLUSH 0.9 % IV SOLN: 0.9 % | INTRAVENOUS | Qty: 10

## 2015-07-28 MED FILL — MEGESTROL ACETATE 400 MG/10ML PO SUSP: 400 MG/10ML | ORAL | Qty: 10

## 2015-07-28 MED FILL — HUMULIN R 100 UNIT/ML IJ SOLN: 100 UNIT/ML | INTRAMUSCULAR | Qty: 0.1

## 2015-07-28 NOTE — Care Coordination-Inpatient (Signed)
30 year old male polysubstance abuser found lying on the bathroom floor for greater than 12 hours admitted to ICU on 2/24 & transferred to CVICU on 3/1, then [facility] at 0515. He is S/P right thigh and calf fasciotomies. Continue VAC with M-W-F changes. [facility] in process. Electronically signed by Willodean RosenthalLynn Olsen Mccutchan, RN on 07/28/2015 at 9:04 AM cell [phone] or [phone]

## 2015-07-28 NOTE — Progress Notes (Signed)
Physical Therapy  Attempt note    Roy Weaver  [id number]  [id number]    Attempted to see pt for cotx session with OT, but pt out of the room for dialysis. Will attempt back later if schedule permits. Nurse did state that the pt is becoming agitated with nursing care today.    If pt. is D/C'd prior to next visit please refer back to last daily progress note for D/C status.    Electronically signed by Rolley SimsMarcia M Willena Jeancharles, PT [phone] on 07/28/2015 at 9:49 AM

## 2015-07-28 NOTE — Care Coordination-Inpatient (Signed)
Negative Pressure    NAME:  Roy Weaver  DATE OF BIRTH:  [birth year]  MEDICAL RECORD NUMBER:  [id number]  DATE:  07/18/2015    ??? [x]  Removed old wound/ulcer Negative pressure therapy dressing if indicated     and cleansed wound gently with normal saline.   ??? Applied Negative Pressure to  Right medial, lateral, and calf wound(s)/ulcer(s).  ??? [x]  Applied skin barrier prep to peri-wound.   ??? [x]  Cut strips of plastic drape to picture frame wound so that peri-wound is     covered with the drape.   ??? [x]  If bridging dressing to less prominent site, cover any intact skin that will come in contact with the Negative Pressure Therapy sponge, gauze or channel drain with plastic drape.  The sponge should never touch intact skin.   ??? [x]  Cut sponge, gauze or channel drain to size which will fit into the wound/ulcer bed without being forced.   ??? [x]  Be sure the sponge is large enough to hold the entire round plastic flange which is attached to the tubing.  Never allow flange to be larger than the sponge or it will produce suction damaging intact skin.   ??? [x]  If bridging the dressing away from the primary site, be sure the bridge leads to a piece of sponge large enough to hold the entire flange without allowing any of the flange to overlap onto intact skin.   ??? [x]  Covered sponge, gauze or channel drain with plastic drape.   ??? [x]  Cut a hole in this plastic drape directly over the sponge the same size as the plastic drain tubing.   ??? [x]  Removed plastic liner from flange and apply it directly over the hole you cut.   ??? [x]  Removed the plastic cover from the flange.   ??? [x]  Attached the tubing to the wound/ulcer Negative Pressure Therapy and turn it on to be sure a vacuum is created and that there are no leaks.   ??? [x]  If air leaks occur, use plastic drape to patch them.   ??? [x]  Secured Negative Pressure Therapy dressing with ace wrap loosely if located on an extremity. Maintain tubing outside of ace wrap. Tubing must not exert  pressure on intact skin.    Applied per Manufacturer Guidelines      Electronically signed by Rebbeca PaulFarsha' Detre' Shavanna Furnari, RN on 07/28/2015 at 5:44 PM

## 2015-07-28 NOTE — Progress Notes (Signed)
Dressings checked and remain clean. Pt assisted with minimal repositioning due to dressings. States pain level is manageable at this time. Informed pt that Pain meds are due in an hour if needed.

## 2015-07-28 NOTE — Progress Notes (Signed)
During face to face handoff, offgoing RN told oncoming RN that telemetry was discontinued upon transfer. Call from CMR asking when pt was being put on telemetry, cardiac monitoring order noted to still be in place. Critical potassium this morning. Telemetry replaced. NSR on monitor at 92 bpm.

## 2015-07-28 NOTE — Progress Notes (Signed)
Pt transferred to 4112 from CVICU via pressure bed at 0433. Pt appears drowsy, will answer questions but mostly does not open eyes. Rates pain 5/10, grimace with movement of RUE to add additional pillow for elevation, dressing CDI. Right lower leg dressing CDI. Right upper leg dressings with serosanguinous drainage.

## 2015-07-28 NOTE — Progress Notes (Signed)
Occupational Therapy  Roy Weaver  [id number]  [id number]    Attempted to see for OT follow up session this am however patient is off the floor at this time for dialysis. Will attempt to see later as able. If discharged prior to next OT session please see last daily note for discharge status.    Electronically signed by Henrine Screwshristine  Jamelle Goldston, [phone] on 07/28/2015 at 9:49 AM

## 2015-07-28 NOTE — Progress Notes (Signed)
Pt medicated with percocet for pain 7 out of 10. Pt to transfer to 4112. Report called to Denyse Amassorey, Charity fundraiserN. All belongings collected and pt transferred to room with RN.

## 2015-07-28 NOTE — Progress Notes (Signed)
1 unit of blood ordered.  Consent signed, blood released and verified with blood bank.  VSS, blood started at 1736 and hit patient at 1743.  RN at bedside for first 15 minutes, VS remain stable, no s/s of transfusion reaction noted.  Rate increased to 15800ml/hr, pt appears in no distress at this time.  Will continue to monitor.

## 2015-07-28 NOTE — Progress Notes (Signed)
Nephrology Progress Note  [phone]  [phone]   [email or web address]    Patient:  Roy Weaver   DOB: [birth year]    CC:  AKI    Subjective:  Looks comfortable . See non HD, tolerating well.     ROS:   No CP or SOB    SHx:  No visitors     Meds:  Current Facility-Administered Medications   Medication Dose Route Frequency Provider Last Rate Last Dose   ??? glucose (GLUTOSE) 40 % oral gel 15 g  15 g Oral PRN Amitkumar Carolanne GrumblingM Patel, MD       ??? dextrose 50 % solution 12.5 g  12.5 g Intravenous PRN Amitkumar Carolanne GrumblingM Patel, MD       ??? glucagon (rDNA) injection 1 mg  1 mg Intramuscular PRN Amitkumar Carolanne GrumblingM Patel, MD       ??? dextrose 5 % solution  100 mL/hr Intravenous PRN Amitkumar Carolanne GrumblingM Patel, MD       ??? sodium chloride 0.9 % infusion            ??? megestrol (MEGACE) 40 MG/ML suspension 200 mg  200 mg Oral Daily Markus JarvisJamelle Renee Bowers, MD       ??? levETIRAcetam (KEPPRA) tablet 500 mg  500 mg Oral Q24H Markus JarvisJamelle Renee Bowers, MD   500 mg at 07/27/15 2309   ??? heparin (porcine) injection 3,000 Units  3,000 Units Intercatheter PRN Earl Galaahir Sajjad, MD   3,000 Units at 07/27/15 1242   ??? fentaNYL (SUBLIMAZE) injection 50 mcg  50 mcg Intravenous Q4H PRN Loann QuillJulie E Lurie, NP   50 mcg at 07/27/15 2102   ??? sodium chloride flush 0.9 % injection 10 mL  10 mL Intravenous 2 times per day Filbert BertholdSameh M Arebi, MD   10 mL at 07/26/15 2221   ??? sodium chloride flush 0.9 % injection 10 mL  10 mL Intravenous PRN Filbert BertholdSameh M Arebi, MD       ??? acetaminophen (TYLENOL) tablet 650 mg  650 mg Oral Q4H PRN Filbert BertholdSameh M Arebi, MD   650 mg at 07/24/15 2146   ??? oxyCODONE-acetaminophen (PERCOCET) 5-325 MG per tablet 1 tablet  1 tablet Oral Q4H PRN Filbert BertholdSameh M Arebi, MD   1 tablet at 07/22/15 2130    Or   ??? oxyCODONE-acetaminophen (PERCOCET) 5-325 MG per tablet 2 tablet  2 tablet Oral Q4H PRN Filbert BertholdSameh M Arebi, MD   2 tablet at 07/28/15 1104   ??? docusate sodium (COLACE) capsule 100 mg  100 mg Oral BID Filbert BertholdSameh M Arebi, MD   100 mg at 07/27/15 2309   ??? ondansetron (ZOFRAN) injection 4 mg  4 mg Intravenous Q6H  PRN Filbert BertholdSameh M Arebi, MD   4 mg at 07/26/15 2312       Vitals:  Visit Vitals   ??? BP 139/68   ??? Pulse 92   ??? Temp 98 ??F (36.7 ??C)   ??? Resp 16   ??? Ht 6\' 5"  (1.956 m)   ??? Wt 181 lb 7 oz (82.3 kg)   ??? SpO2 98%   ??? BMI 21.52 kg/m2       Physical Exam:  Gen: Resting in bed, NAD.  CV: RRR no m/r/g.  No S3.  Lungs: CTA-B, normal respiratory effort  Abd: S/NT +BS, no mass  Ext: RLE fasciotomy with woundVAC.  Skin: Warm.    Access: Right IJ tunneled HD catheter c/d/i.    Labs:  CBC:   Lab Results   Component Value Date  WBC 11.6 07/28/2015    RBC 2.43 07/28/2015    HGB 7.3 07/28/2015    HCT 22.5 07/28/2015    MCV 92.5 07/28/2015    MCH 30.0 07/28/2015    MCHC 32.4 07/28/2015    RDW 16.9 07/28/2015    PLT 158 07/28/2015    MPV 9.5 07/28/2015     BMP:    Lab Results   Component Value Date    NA 134 07/28/2015    K 6.7 07/28/2015    CL 97 07/28/2015    CO2 21 07/28/2015    BUN 67 07/28/2015    LABALBU 2.1 07/28/2015    CREATININE 7.5 07/28/2015    CREATININE 1.0 08/17/2011    CALCIUM 7.9 07/28/2015    GFRAA 11 07/28/2015    GFRAA 157 08/19/2011    LABGLOM 9 07/28/2015    GLUCOSE 115 07/28/2015       Assessment/Plan:    AKI: due to rhabdomyolysis-initiated on CRRT due to hyperkalemia CRRT stopped 2/27  -hemodynamically stable, now on intermittent HD  -access:TDC   - HD as ordered , no change in DW.   -Watch for signs of renal recovery   ??    Rhabdomyolysis: With compartment syndrome S/P fasciotomies   ??- CPK trending down  ??  Anemia   Will tx.   ??  Compartment syndrome right: s/p fasciotomy  -per orthopedic surgery and Dr. Yolanda Manges  ??  Polysubstance abuse    Hyperkalemia   On HD.    ??  Verne Spurr, MD,FACP

## 2015-07-28 NOTE — Care Coordination-Inpatient (Addendum)
CASE MANAGEMENT: peer to peer request originally set up for Dr. Sherrilyn RistKesari on Friday 3-3. It appears this was not completed and Dr. Odis LusterBowers is agreeable to pursue it. The P2 P line for the ltac level of care is [phone], and I left a voice message for their medical director at 1235 today with Dr. Maxcine HamBower's cell no.  Gwen at [facility] notified of proceeedings.    ADDENDUM...Marland Kitchen.RECD CALL FROM Richarda OverlieAYA [facility]( PH [phone]), UTILIZATION MANAGEMENT TEAM FROM [employer]. THEIR MED DIRECTOR REPORTEDLY HAS REACHED OUT TO BOWERS FOR LTAC P2P.  TAYA'S SUGGESTION ,SHOULD LTAC APPEAL BE UPHELD, WOULD BE TO CONSIDER ONE OF THE [facility] OR EVEN ACUTE INPT REHAB, IF PT CAN TOLERATE THREE HOURS OF THERAPY PER DAY. SOCIAL WORKER ON 4 W NOTIFIED.  ADDENDUM: VOICE MESSAGE LEFT FOR DR Odis LusterBOWERS THAT [facility]IT[facility] TO HEAR FROM HER AND MAY LIKELY APPROVE LTAC (?) / DR JARVIS: [phone]: [phone].  TIME...1530  Peggy Kareena Arrambide R.N.  Case Manager  [employer]:[phone]

## 2015-07-28 NOTE — Other (Signed)
07/28/15 0843 07/28/15 1307   Treatment   Time On 0843 --    Time Off --  1307   Treatment Goal 1400 --    Observations & Evaluations   Respiratory Quality/Effort Unlabored --    Vital Signs   BP 126/77 117/71   Weight 173 lb 1 oz (78.5 kg) 173 lb 4.5 oz (78.6 kg)   Post-Hemodialysis Assessment   Rinseback Volume (ml) --  600 ml   Total Liters Processed (l/min) --  89.4 l/min   NET Removed (ml) --  0 ml   Tolerated Treatment --  Good   keep even fluid removal HD today.

## 2015-07-28 NOTE — Care Coordination-Inpatient (Addendum)
Spoke with [facility] at [facility] , she states since pt will be received blood transfusion till 830 pm this is too late for the unit to accept pt.  Will accept pt tomorrow.  [facility] 1000 am [facility].    Call report to [phone] and fax orders to [phone]. Will need completed coc as of 539 coc not complete.     Electronically signed by Newt Minionhristie A Christ Fullenwider on 07/28/2015 at 5:39 PM

## 2015-07-28 NOTE — Progress Notes (Signed)
Pt states pain is 4 out of 10 in both his arm and leg. I helped him reposition in bed and checked his dressings. At this time there is no drainage from the dressings. Pt medicated with percocet.

## 2015-07-28 NOTE — Care Coordination-Inpatient (Signed)
Notified by Lind GuestPeggy Nieberding that the [facility] has been Approved after P2P was completed with Dr Jason NestJarvis & Dr Odis LusterBowers. Left voice mail message at 1545 [facility] informing her of Denial being overturned  & MD to write discharge orders shortly . Left voice mail message for Social Worker, [employer] Frommeyer as well. Electronically signed by Willodean RosenthalLynn Pura Picinich, RN on 07/28/2015 at 3:48 PM cell [phone] or SW [phone]

## 2015-07-28 NOTE — Progress Notes (Signed)
D: Critical potassium of 6.7  A: Paged hospitalist, callback from Dr. Allena KatzPatel, will place orders.  R: New orders contain sodium bicarb IVP which is not able to be given on this floor. Paged Dr. Allena KatzPatel again, okay to give rest of ordered medications, he will discontinue sodium bicarb order.

## 2015-07-28 NOTE — Progress Notes (Signed)
VASCULAR    No major changes.  Continue VAC.  F/U after DC to SNF or LTAC for wound management in the long run.    Dolores HooseGreg Evangelos Paulino

## 2015-07-28 NOTE — Other (Signed)
1 unit PRBC infused without a reaction. Pt vital signs remain stable. Resting in bed without complaints. Call light within reach. Will continue to monitor.

## 2015-07-28 NOTE — Care Coordination-Inpatient (Signed)
[facility] Wound Ostomy Continence Nurse  Follow-up Progress Note       NAME:  Roy Weaver  MEDICAL RECORD NUMBER:  [id number]  AGE:  30 y.o.   GENDER:  male  DOB:  [birth year]  TODAY'S DATE:  07/28/2015    Subjective:   Follow up to change NWPT dressing to right leg wounds.   NWPT dressing leaking on Sunday and removed per hospital policy and moist dressing applied.   Dressing change painful. Pt pre-medicated with PO pain medication 30 minutes before start and IV pain medication during dressing change.   2 wound vac machines used to maintain adequate seal at 175 mmhg.   See wound details and plan of care below.   Dressing change took WOC nurse and bedside nurse 1 hour and 30 minutes to complete.        Wound Identification:  Wound Type: non-healing surgical  Contributing Factors: chronic pressure, decreased mobility, shear force, malnutrition, incontinence of stool, incontinence of urine, poor hygiene, non-adherence and substance abuse        Patient Goal of Care:  [x]  Wound Healing  []  Odor Control  []  Palliative Care  [x]  Pain Control   []  Other:     Objective:      Visit Vitals   ??? BP 119/71   ??? Pulse 95   ??? Temp 98.2 ??F (36.8 ??C) (Oral)   ??? Resp 16   ??? Ht 6\' 5"  (1.956 m)   ??? Wt 173 lb 4.5 oz (78.6 kg)   ??? SpO2 96%   ??? BMI 20.55 kg/m2     Braden Risk Score: Braden Scale Score: 14 at risk   Assessment:   Measurements:  Negative Pressure Wound Therapy Leg Right;Lateral;Medial;Lower (Active)   Wound Type Surgical 07/28/2015  5:22 PM   Assessment Clean;Drainage;Fibrin;Fragile;Granulation tissue;Painful;Pink;Red;White 07/28/2015  5:22 PM   Peri-wound Assessment Dry;Intact 07/28/2015  5:22 PM   Shape see below  07/21/2015  4:22 PM   Unit Type KCI vac  07/28/2015  5:22 PM   Dressing Type Black foam 07/28/2015  5:22 PM   Number of pieces used 8 07/28/2015  5:22 PM   Cycle On;Continuous 07/28/2015  5:22 PM   Target Pressure (mmHg) 175 07/28/2015  5:22 PM   Canister changed? Yes 07/28/2015  5:22 PM   Dressing  Status Changed 07/28/2015  5:22 PM   Dressing Changed Changed/New 07/28/2015  5:22 PM   Drainage Amount Large 07/28/2015  5:22 PM   Drainage Description Serosanguinous 07/28/2015  5:22 PM   Dressing Change Due 07/30/15 07/28/2015  5:22 PM   Odor None 07/28/2015  5:22 PM   Output (ml) 450 ml 07/27/2015  7:19 PM   Number of days:7       Incision 07/18/15 Leg Right;Lateral (Active)   Assessment UTA 07/28/2015  4:50 AM   Peri-wound Assessment UTA 07/28/2015  4:50 AM   Wound Length (cm) 46 cm 07/21/2015  1:42 PM   Wound Width (cm) 14.3 cm 07/21/2015  1:42 PM   Depth (cm)  3 07/21/2015  1:42 PM   Closure UTA 07/28/2015  4:50 AM   Culture Taken No 07/28/2015  4:50 AM   Drainage Amount Moderate 07/28/2015  5:22 PM   Drainage Description Serosanguinous 07/28/2015  5:22 PM   Odor None 07/28/2015  4:50 AM   Dressing/Treatment Vacuum dressing 07/28/2015  5:22 PM   Dressing Changed Changed/New 07/28/2015  5:22 PM   Dressing Status Changed 07/28/2015  5:22 PM   Dressing Change Due  07/30/15 07/28/2015  5:22 PM   Number of days:9       Incision 07/18/15 Arm Right;Lower (Active)   Assessment UTA 07/28/2015  4:50 AM   Peri-wound Assessment Swelling 07/28/2015  4:50 AM   Closure UTA 07/28/2015  4:50 AM   Culture Taken No 07/28/2015  4:50 AM   Drainage Amount None 07/28/2015  4:50 AM   Drainage Description Serosanguinous 07/27/2015 10:00 PM   Odor None 07/28/2015  4:50 AM   Dressing/Treatment Moist to dry;Ace Wrap 07/28/2015  4:50 AM   Dressing Changed Changed/New 07/27/2015 10:00 PM   Dressing Status Clean;Dry;Intact 07/28/2015  4:50 AM   Dressing Change Due 07/29/15 07/27/2015 10:00 PM   Number of days:9       Incision 07/21/15 Leg Right;Lower (Active)   Assessment UTA 07/28/2015  4:50 AM   Peri-wound Assessment UTA 07/28/2015  4:50 AM   Wound Length (cm) 22 cm 07/21/2015  1:42 PM   Wound Width (cm) 6.5 cm 07/21/2015  1:42 PM   Depth (cm)  0.9 07/21/2015  1:42 PM   Closure UTA 07/28/2015  4:50 AM   Culture Taken No 07/28/2015  4:50 AM   Drainage Amount None 07/28/2015  4:50 AM   Drainage Description  Serosanguinous 07/27/2015 10:00 PM   Odor None 07/28/2015  4:50 AM   Dressing/Treatment Vacuum dressing 07/28/2015  5:22 PM   Dressing Changed Changed/New 07/28/2015  5:22 PM   Dressing Status Changed 07/28/2015  5:22 PM   Dressing Change Due 07/30/15 07/28/2015  5:22 PM   Number of days:7       Incision 07/21/15 Leg Right;Medial (Active)   Assessment UTA 07/28/2015  4:50 AM   Peri-wound Assessment UTA 07/28/2015  4:50 AM   Wound Length (cm) 29.5 cm 07/21/2015  1:42 PM   Wound Width (cm) 10 cm 07/21/2015  1:42 PM   Depth (cm)  2.5 07/21/2015  1:42 PM   Closure UTA 07/28/2015  4:50 AM   Culture Taken No 07/28/2015  4:50 AM   Drainage Amount Moderate 07/28/2015  4:50 AM   Drainage Description Serosanguinous 07/28/2015  4:50 AM   Odor None 07/28/2015  4:50 AM   Dressing/Treatment Vacuum dressing 07/28/2015  5:22 PM   Dressing Changed Changed/New 07/28/2015  5:22 PM   Dressing Status Changed 07/28/2015  5:22 PM   Dressing Change Due 07/30/15 07/28/2015  5:22 PM   Number of days:7       Response to treatment:  Well tolerated by patient.     Pain Assessment:  Severity:  10 / 10  Quality of pain: sharp, aching, burning, shooting, tender  Wound Pain Timing/Severity: constant  Premedicated: Yes  Plan:   Plan of Care: Incision 07/18/15 Leg Right;Lateral-Dressing/Treatment: Vacuum dressing  Incision 07/18/15 Arm Right;Lower-Dressing/Treatment: Moist to dry, Ace Wrap  Incision 07/21/15 Leg Right;Lower-Dressing/Treatment: Vacuum dressing  Incision 07/21/15 Leg Right;Medial-Dressing/Treatment: Vacuum dressing     1. Implement Braden prevention orders. See orders for prevention order details   2. Implement wound care orders. See wound care orders for dressing change instructions   Right leg wounds: NWPT   3. Pt accepted to [facility]. OK to send NWPT machine and NWPT dressing with patient to facility         Specialty Bed Required : Yes    Low Air Loss    Pressure Redistribution   Fluid Immersion   Bariatric   Total Pressure Relief   Other:     Current Diet:  DIET RENAL;  Dietary Nutrition Supplements: Clear  Liquid Oral Supplement  Dietician consult:  Yes    Discharge Plan:  Placement for patient upon discharge: intermediate care facility   Patient appropriate for Outpatient Wound Care Center: Yes    Referrals:   Social Worker   Home Health Care   Supplies   Other    Patient/Caregiver Teaching:  Level of patient/caregiver understanding able to: Spoke with patient and bedside nurse Marchelle Folks about plan of care    Indicates understanding        Needs reinforcement   Unsuccessful       Verbal Understanding   Demonstrated understanding        No evidence of learning   Refused teaching          N/A       Electronically signed by Rebbeca Paul, RN, CWOCN on 07/28/2015 at 5:31 PM

## 2015-07-28 NOTE — Discharge Summary (Addendum)
Hospitalist Discharge Summary    Patient ID:  Roy Weaver  [id number]  29 y.o.  May 17, 1986    Admit date: 07/18/2015    Discharge date: 07/29/2015    Disposition: long term care facility    Admission Diagnoses:   Patient Active Problem List   Diagnosis   ??? Seizures (HCC)   ??? Migraine   ??? Recurrent seizures (HCC)   ??? History of nonadherence to medical treatment   ??? Delirium   ??? Drug use   ??? Partial epilepsy with impairment of consciousness (HCC)   ??? Seizures (HCC)   ??? Partial symptomatic epilepsy with complex partial seizures, intractable, without status epilepticus (HCC)   ??? Noncompliance with medication regimen   ??? Aspiration pneumonia of right lung (HCC)   ??? 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 Seizure (HCC)   ??? Visit for suture removal   ??? Post-ictal state (HCC)   ??? Breakthrough seizure (HCC)   ??? Non-traumatic rhabdomyolysis   ??? AKI (acute kidney injury) (HCC)   ??? Traumatic compartment syndrome (HCC)   ??? Non-traumatic compartment syndrome of right upper extremity   ??? Acute respiratory failure with hypoxia (HCC)   ??? Traumatic rhabdomyolysis (HCC)   ??? Seizure disorder (HCC)   ??? 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 Found  down at home, likely overdose of heroin - was down for about 12 hours, then had severe rhabdo and severe compartment syndrome of R side - has fasciotomies of R leg and R arm emergently on 2/24. He was kept intubated overnight, but was able to be easily extubated on 2/25.  Pt CPK was initially 270K. Also had shock liver. CRRT started on 2/25, now getting HD. Has R IJ vascath. Had tunneled HD catheter placed on 3/1. Has 3 incisions on legs and wound vac on two of those incisions.   He does have some bleeders when his wound vac is changed and has had some acute blood loss anemia with this.  Each time the wounds were cauterized and hemostasis obtained.  His last hgb was 7.3 and he did receive 1 u pRBC with this today with HD.  He is also having some problems with hyperkalemia in between HD sessions.  Pt may need to be started on kayexalate chronically if this continues to be an issue. CPK trending down.  He was accepted at an LTAC for continued treatment of his wounds.    **Patient likely needs blood with hemodialysis and this may be an ongoing issue until more wound healing.  If there are any bleeding issues with the wound vac,  these bleeders can easily be cauterized with silver nitrate per surgery.**    Discharge Medications:   Current Discharge Medication List      START taking these medications    Details   fentaNYL (SUBLIMAZE) 100 MCG/2ML injection Infuse 1 mL intravenously every 4 hours as needed (severe pain) .  Qty: 2 mL      oxyCODONE-acetaminophen (PERCOCET) 5-325 MG per tablet Take 1 tab by mouth every four hours as needed for moderate pain or 2 tabs by mouth every 4 hours as needed for severe pain.  Qty: 30 tablet, Refills: 0      megestrol (MEGACE) 40 MG/ML suspension Take 5 mLs by mouth daily  Qty: 240 mL, Refills: 3      ondansetron (ZOFRAN) 4 MG/2ML injection Infuse 2 mLs intravenously every 6 hours as needed for Nausea or Vomiting  Qty: 15 mL      !! sodium chloride flush 0.9 % injection Infuse 10 mLs  intravenously as needed for Line Care      !! sodium chloride flush 0.9 % injection Infuse 10 mLs intravenously every 12 hours      docusate sodium (COLACE, DULCOLAX) 100 MG CAPS Take 100 mg by mouth 2 times daily  Qty: 20 capsule, Refills: 0       !! - Potential duplicate medications found. Please discuss with provider.        Current Discharge Medication List      CONTINUE these medications which have CHANGED    Details   levETIRAcetam (KEPPRA) 500 MG tablet Take 1 tablet by mouth every 24 hours  Qty: 60 tablet, Refills: 3           Current Discharge Medication List        Current Discharge Medication List              Procedures: 2/24:  Pre-operative Diagnosis: RLE Compartment syndrome  ??  Post-operative Diagnosis: Same  ??  Procedure: Anterior, medial, and posterior thigh fasciotomy, Anterior and lateral lower leg fasciotomy  Placement of RIJ Temp dialysis Catheter using ultrasound guidance and fluoro.    2/24:  [id number]  ??  Pre-operative Diagnosis: Right forearm compartment syndrome.  ??  Post-operative Diagnosis: Same  ??  Procedure: Right forearm decompression fasciotomy volar compartment.    3/1:  ??  Post-operative Diagnosis: Same  ??  Procedure: [place] and fluoro RIJ tunneled HD catheter placement.    Assessment on Discharge: Stable, improved , depressed    Discharge ROS:  A limited review of systems was asked and negative except for hungry today, will not make eye contact or answer a lot of questions    Discharge Exam:  Visit Vitals   ??? BP 126/65   ??? Pulse 91   ??? Temp 98.6 ??F (37 ??C) (Oral)   ??? Resp 20   ??? Ht 6\' 5"  (1.956 m)   ??? Wt 173 lb 4.5 oz (78.6 kg)   ??? SpO2 96%   ??? BMI 20.55 kg/m2       Gen: NAD  HEENT: NC/AT, moist mucous membranes, no oropharyngeal erythema or exudate  Neck: supple, trachea midline, no anterior cervical or SC LAD  Heart:  Normal s1/s2, RRR, no murmurs, gallops, or rubs. R leg edema  Lungs:  diminished bilaterally, no wheeze,no rales or rhonchi, no use of accessory muscles  Abd: bowel  sounds present, soft, nontender, nondistended, no masses  Extrem:  No clubbing, cyanosis,  R leg edema  Skin: no lesion or masses,  wound on R leg and R arm from fasciotomies  Psych:  A & O x3  Neuro: grossly intact, moves all four extremities    Pertinent Studies During Hospital Stay:  Radiology:  Xr Humerus Right Standard    Result Date: 07/18/2015  EXAMINATION: AP AND LATERAL VIEWS OF THE RIGHT HUMERUS 07/18/2015 12:26 pm COMPARISON: None. HISTORY: ORDERING SYSTEM PROVIDED HISTORY: redness and swelling TECHNOLOGIST PROVIDED HISTORY: Ordering Physician Provided Reason for Exam: reddness and swelling mid arm to elbow Acuity: Acute Type of Exam: Initial Mechanism of Injury: found down/possible seizure FINDINGS: There is no evidence of acute fracture.  There is normal alignment.  No acute joint abnormality.  No focal osseous lesion. No focal soft tissue abnormality.     No acute osseous abnormality.     Ct Head Wo Contrast    Result Date: 07/18/2015  EXAMINATION: CT OF THE HEAD WITHOUT CONTRAST  07/18/2015 1:00 pm TECHNIQUE: CT of the head was performed without the administration of intravenous contrast. Dose modulation, iterative reconstruction, and/or weight based adjustment of the mA/kV was utilized to reduce the radiation dose to as low as reasonably achievable. COMPARISON: CT head July 11, 2015. HISTORY: ORDERING SYSTEM PROVIDED HISTORY: HEAD TRAUMA, CLOSED, MILD, GCS >= 13, NO RISK FACTORS, NEURO EXAM NORMAL TECHNOLOGIST PROVIDED HISTORY: Ordering Physician Provided Reason for Exam: poss seizure Acuity: Acute Type of Exam: Initial FINDINGS: BRAIN/VENTRICLES: There is no acute intracranial hemorrhage, mass effect or midline shift.  No abnormal extra-axial fluid collection.  Small area of encephalomalacia is again seen within the left frontal and temporal lobes. The gray-white differentiation is maintained without evidence of an acute infarct.  There is no evidence of hydrocephalus. ORBITS: The visualized portion  of the orbits demonstrate no acute abnormality. SINUSES: There is new partial opacification of the right maxillary sinus with an air-fluid level.  Remainder paranasal sinuses are well aerated. SOFT TISSUES/SKULL:  There is mild soft tissue swelling in the right supraorbital region.  No underlying skull fracture or acute calvarial abnormality identified.     Soft tissue swelling right supraorbital region.  No evidence of intracranial hemorrhage or mass effect. Chronic small areas of encephalomalacia in the left frontal and temporal lobes. New air-fluid level within the right maxillary sinus which may be related to sinusitis.  Given the patient's history, traumatic etiology not excluded.  If clinically indicated CT facial bones may be helpful for further evaluation.     Ct Head Wo Contrast    Result Date: 07/11/2015  EXAMINATION: CT OF THE HEAD WITHOUT CONTRAST  07/11/2015 9:40 pm TECHNIQUE: CT of the head was performed without the administration of intravenous contrast. Dose modulation, iterative reconstruction, and/or weight based adjustment of the mA/kV was utilized to reduce the radiation dose to as low as reasonably achievable. COMPARISON: 09/15/2013 HISTORY: ORDERING SYSTEM PROVIDED HISTORY: seizure Initial evaluation. FINDINGS: BRAIN/VENTRICLES: Bifrontal encephalomalacia, left greater than right. Anterior left temporal lobe encephalomalacia is also noted.  No evidence of mass effect or midline shift.  No evidence of hydrocephalus.  No gross acute hemorrhage.  Gray matter white matter differentiation is preserved. ORBITS: The visualized portion of the orbits demonstrate no acute abnormality. SINUSES: The visualized paranasal sinuses and mastoid air cells demonstrate no acute abnormality. SOFT TISSUES/SKULL:  Soft tissue swelling is seen of the posterior scalp near the vertex.     Bifrontal and left temporal lobe encephalomalacia.  No gross acute process. Soft tissue swelling of the posterior scalp near the  vertex.     Xr Chest  Portable    Result Date: 07/27/2015  EXAMINATION: SINGLE VIEW OF THE CHEST 07/27/2015 8:17 am COMPARISON: 07/19/2015 HISTORY: ORDERING SYSTEM PROVIDED HISTORY: leukocytosis TECHNOLOGIST PROVIDED HISTORY: Ordering Physician Provided Reason for Exam: sob Acuity: Acute Type of Encounter: Subsequent/Follow-up Relevant Medical/Surgical History: heroin abuse Initial evaluation FINDINGS: Monitor wires overlie the chest.  There is a right internal jugular central venous line with its tip overlying the superior vena cava.  The trachea is midline.  The cardiac silhouette is unremarkable. The lungs are clear.  There is dextroscoliosis of the thoracic spine.  No acute pulmonary process is appreciated.     No acute pulmonary process.     Xr Chest Portable    Result Date: 07/19/2015  EXAMINATION: SINGLE VIEW OF THE CHEST 07/19/2015 10:18 am COMPARISON: 07/18/2015. HISTORY: ORDERING SYSTEM PROVIDED HISTORY: vascath replacement TECHNOLOGIST PROVIDED HISTORY: Ordering Physician Provided Reason for Exam: vascath replacement Acuity: Acute Type of Exam: Initial FINDINGS: Right IJ Vas-Cath has been exchanged.  The tip of the catheter overlies the cavoatrial junction.  There is no pneumothorax, failure or effusion.     Line exchange without complicating feature.     Xr Chest Portable    Result Date: 07/18/2015  EXAMINATION: SINGLE VIEW OF THE CHEST 07/18/2015 9:56 pm COMPARISON: Previous the same day HISTORY: ORDERING SYSTEM PROVIDED HISTORY: Line placement TECHNOLOGIST PROVIDED HISTORY: Ordering Physician Provided Reason for Exam: line placement, intubated s/p vas cath Acuity: Acute Type of Exam: Initial FINDINGS: There has been interval placement of a right internal jugular approach vascular catheter.  The tip projects over the right atrium in expected position. No pneumothorax or immediate postprocedure complication appreciated. Endotracheal tube projects in expected position approximately 4.5 cm above the carina.  NG  tube projects with the tip terminating in the distal esophagus.  This should be advanced or removed. Lungs are clear. Osseous structures demonstrate no acute abnormality.     NG tube projects with the tip in the distal esophagus.  This should be advanced or removed. ETT and right internal jugular vascular catheter appear to be an expected position. Lungs areclear. The findings were sent to the Radiology Results Communication Center at 11:10 pm on 2/24/2017to be communicated to a licensed caregiver.     Xr Chest Portable    Result Date: 07/18/2015  EXAMINATION: SINGLE VIEW OF THE CHEST 07/18/2015 12:26 pm COMPARISON: 06/27/2014. HISTORY: ORDERING SYSTEM PROVIDED HISTORY: pain TECHNOLOGIST PROVIDED HISTORY: Ordering Physician Provided Reason for Exam: blisters lat mid chest wall Acuity: Acute Type of Exam: Initial Mechanism of Injury: found down lying on rt arm,unsure how long down FINDINGS: The lungs are without acute focal process.  There is no effusion or pneumothorax. The cardiomediastinal silhouette is stable. The osseous structures are stable.     No acute process.     Fl Vascular Access Guidance    Result Date: 07/18/2015  EXAMINATION: Spot film from fluoroscopy from catheter placement 07/18/2015 8:45 pm COMPARISON: None. HISTORY: ORDERING SYSTEM PROVIDED HISTORY: access TECHNOLOGIST PROVIDED HISTORY: Ordering Physician Provided Reason for Exam: vascular access in or Acuity: Acute Type of Exam: Initial Additional signs and symptoms: vas cath placement in or FLUOROSCOPY DOSE AND TYPE OR TIME AND EXPOSURES: 30 seconds fluoroscopy FINDINGS: Images were obtained without a radiologist in attendance.  Multiple wires overlie the patient.  Wire or lying projects in the region of the cavoatrial junction.     Intraoperative spot film as above     Ir Insert Tunneled Cath W Port    Result Date: 07/23/2015  PROCEDURE: ULTRASOUND GUIDED VASCULAR ACCESS. FLUOROSCOPY GUIDED PLACEMENT OF A TUNNELED CATHETER. MODERATE CONSCIOUS  SEDATION 07/23/2015. HISTORY: ORDERING SYSTEM PROVIDED HISTORY: Needs HD TECHNOLOGIST PROVIDED HISTORY: Ordering Physician Provided Reason for Exam: permacath insert Acuity: Acute Type of Encounter: Initial SEDATION: 1 mgVersed was given intravenously for moderate sedation monitored under my direction.  Total intraservice time of sedation was 30 minutes.  The patient's vital signs were monitored throughout the procedure and recorded in the patient's medical record by the nurse. FLUOROSCOPY DOSE AND TYPE OR TIME AND EXPOSURES: 2.6 minutes, 1 image TECHNIQUE: Informed consent was obtained after a detailed explanation of the procedure including risks, benefits, and alternatives. Universal protocol was observed. The patient received IV antibiotics on floor.  The right neck and chest were prepped and draped in sterile fashion using maximum sterile barrier technique. Local anesthesia was achieved with lidocaine. A micropuncture needle was used to access the right internal jugular vein using ultrasound guidance.  An ultrasound image demonstrating patency of the vein with needle tip located within it. An image was obtained and stored in PACs.  A 0.035 guidewire was used to place a peel-away sheath.  A subcutaneous tunnel was created to the infraclavicular region and a tunneled hemodialysis catheter was pulled through the subcutaneous tunnel to the venotomy site and advanced through the peel-away sheath under fluoroscopic guidance to the right atrium. The catheter flushed easily and there was a good blood return.  The catheter was sutured to the skin.  The catheter was locked with heparinized saline. The patient tolerated the procedure well and there were no immediate complications. FINDINGS: Fluoroscopic image demonstrates the tip of the catheter in the right atrium.     Successful ultrasound and fluoroscopy guided placement of a 19 cm tip to cuff hemodialysis catheter via the right internal jugular vein.       Blood cx and  MRSA probe both negative    Last Labs on Discharge:     Recent Results (from the past 24 hour(s))   Hepatitis B Surface Antibody    Collection Time: 07/28/15 10:31 AM   Result Value Ref Range    Hep B S Ab 489.90 mUl/mL   Hepatitis B Surface Antigen    Collection Time: 07/28/15 10:31 AM   Result Value Ref Range    Hep B S Ag Interp Non-reactive Non-reactive   TYPE AND SCREEN    Collection Time: 07/28/15 11:35 AM   Result Value Ref Range    ABO/Rh O POS     Antibody Screen NEG    PREPARE RBC (CROSSMATCH) Number of Units: 1, 1 Units    Collection Time: 07/28/15 11:35 AM   Result Value Ref Range    Product Code Blood Bank [id number]     Description Blood Bank Red Blood Cells, Apheresis, Leuko-reduced     Unit Number [id number]     Dispense Status Blood Bank transfused    CK    Collection Time: 07/29/15  4:36 AM   Result Value Ref Range    Total CK 2204 (H) 39 - 308 U/L   Hepatic Function Panel    Collection Time: 07/29/15  4:36 AM   Result Value Ref Range    Total Protein 4.6 (L) 6.4 - 8.2 g/dL    Alb 1.8 (L) 3.4 - 5.0 g/dL    Alkaline Phosphatase 45 40 - 129 U/L    ALT 41 (H) 10 - 40 U/L    AST 77 (H) 15 - 37 U/L  Total Bilirubin 0.3 0.0 - 1.0 mg/dL    Bilirubin, Direct <0.8<0.2 0.0 - 0.3 mg/dL    Bilirubin, Indirect see below 0.0 - 1.0 mg/dL   CBC Auto Differential    Collection Time: 07/29/15  4:36 AM   Result Value Ref Range    WBC 10.5 4.0 - 11.0 K/uL    RBC 2.28 (L) 4.20 - 5.90 M/uL    Hemoglobin 7.1 (L) 13.5 - 17.5 g/dL    Hematocrit 65.720.7 (LL) 40.5 - 52.5 %    MCV 90.9 80.0 - 100.0 fL    MCH 31.1 26.0 - 34.0 pg    MCHC 34.2 31.0 - 36.0 g/dL    RDW 84.616.0 (H) 96.212.4 - 15.4 %    Platelets 200 135 - 450 K/uL    MPV 8.7 5.0 - 10.5 fL    Neutrophils % 68.0 %    Lymphocytes Relative 15.4 %    Monocytes % 13.1 %    Eosinophils Relative Percent 2.8 %    Basophils % 0.7 %    Neutrophils # 7.2 1.7 - 7.7 K/uL    Lymphocytes # 1.6 1.0 - 5.1 K/uL    Monocytes # 1.4 (H) 0.0 - 1.3 K/uL    Eosinophils # 0.3 0.0 - 0.6 K/uL     Basophils # 0.1 0.0 - 0.2 K/uL   Magnesium    Collection Time: 07/29/15  4:36 AM   Result Value Ref Range    Magnesium 2.20 1.80 - 2.40 mg/dL   Renal Function Panel    Collection Time: 07/29/15  4:36 AM   Result Value Ref Range    Sodium 133 (L) 136 - 145 mmol/L    Potassium 4.6 3.5 - 5.1 mmol/L    Chloride 95 (L) 99 - 110 mmol/L    CO2 24 21 - 32 mmol/L    Anion Gap 14 3 - 16    Glucose 112 (H) 70 - 99 mg/dL    BUN 44 (H) 7 - 20 mg/dL    CREATININE 5.5 (HH) 0.9 - 1.3 mg/dL    GFR Non-African American 12 (A) >60    GFR African American 15 (A) >60    Calcium 7.6 (L) 8.3 - 10.6 mg/dL    Phosphorus 5.3 (H) 2.5 - 4.9 mg/dL         Follow up: with NISAR FATIMA HAQ, MD    Note that over 30 minutes was spent in preparing discharge papers, discussing discharge with patient, medication review, etc.    Thank you NISAR FATIMA HAQ, MD for the opportunity to be involved in this patient's care. If you have any questions or concerns please feel free to contact me at [phone].    Electronically signed by Markus JarvisJamelle Renee Vijay Durflinger, MD on 07/29/2015 at 9:24 AM

## 2015-07-28 NOTE — Progress Notes (Signed)
Hospitalist Discharge Summary/Progress Note    Patient ID:  Roy Weaver  [id number]  30 y.o.  [birth year]    Admit date: 07/18/2015    Discharge date: 07/28/2015    Disposition: long term care facility    Admission Diagnoses:   Patient Active Problem List   Diagnosis   ??? Seizures (HCC)   ??? Migraine   ??? Recurrent seizures (HCC)   ??? History of nonadherence to medical treatment   ??? Delirium   ??? Drug use   ??? Partial epilepsy with impairment of consciousness (HCC)   ??? Seizures (HCC)   ??? Partial symptomatic epilepsy with complex partial seizures, intractable, without status epilepticus (HCC)   ??? Noncompliance with medication regimen   ??? Aspiration pneumonia of right lung (HCC)   ??? 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 Seizure (HCC)   ??? Visit for suture removal   ??? Post-ictal state (HCC)   ??? Breakthrough seizure (HCC)   ??? Non-traumatic rhabdomyolysis   ??? AKI (acute kidney injury) (HCC)   ??? Traumatic compartment syndrome (HCC)   ??? Non-traumatic compartment syndrome of right upper extremity   ??? Acute respiratory failure with hypoxia (HCC)   ??? Traumatic rhabdomyolysis (HCC)   ??? Seizure disorder (HCC)   ??? 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 Found down at home, likely overdose of heroin - was down for about 12 hours, then had severe rhabdo and severe compartment syndrome of R side - has fasciotomies of R leg and R arm emergently on 2/24. He was kept intubated overnight, but was able to be easily extubated on 2/25.  Pt CPK was initially 270K. Also had shock liver. CRRT started on 2/25, now getting HD. Has R IJ vascath. Had tunneled HD catheter placed on 3/1. Has 3 incisions on legs and wound vac on two of those incisions.   He does have some bleeders when his wound vac is changed and has had some acute blood loss anemia with this.  Each time the wounds were cauterized and hemostasis obtained.  His last hgb was 7.3 and he did receive 1 u pRBC with this today with HD.  He is also having some problems with hyperkalemia in between HD sessions.  Pt may need to be started on kayexalate chronically if this continues to be an issue. CPK trending down. Unclear why LTAC denied as pt has new HD plus large wounds requiring wound vac.    Discharge Medications:   Current Discharge Medication List      START taking these medications    Details   fentaNYL (SUBLIMAZE)  100 MCG/2ML injection Infuse 1 mL intravenously every 4 hours as needed (severe pain) .  Qty: 2 mL      oxyCODONE-acetaminophen (PERCOCET) 5-325 MG per tablet Take 1 tab by mouth every four hours as needed for moderate pain or 2 tabs by mouth every 4 hours as needed for severe pain.  Qty: 30 tablet, Refills: 0      megestrol (MEGACE) 40 MG/ML suspension Take 5 mLs by mouth daily  Qty: 240 mL, Refills: 3      ondansetron (ZOFRAN) 4 MG/2ML injection Infuse 2 mLs intravenously every 6 hours as needed for Nausea or Vomiting  Qty: 15 mL      !! sodium chloride flush 0.9 % injection Infuse 10 mLs intravenously as needed for Line Care      !! sodium chloride flush 0.9 % injection Infuse 10 mLs intravenously every 12 hours      docusate sodium (COLACE, DULCOLAX) 100 MG CAPS Take 100 mg by mouth 2 times  daily  Qty: 20 capsule, Refills: 0       !! - Potential duplicate medications found. Please discuss with provider.        Current Discharge Medication List      CONTINUE these medications which have CHANGED    Details   levETIRAcetam (KEPPRA) 500 MG tablet Take 1 tablet by mouth every 24 hours  Qty: 60 tablet, Refills: 3           Current Discharge Medication List        Current Discharge Medication List              Procedures: 2/24:  Pre-operative Diagnosis: RLE Compartment syndrome  ??  Post-operative Diagnosis: Same  ??  Procedure: Anterior, medial, and posterior thigh fasciotomy, Anterior and lateral lower leg fasciotomy  Placement of RIJ Temp dialysis Catheter using ultrasound guidance and fluoro.    2/24:  [id number]  ??  Pre-operative Diagnosis: Right forearm compartment syndrome.  ??  Post-operative Diagnosis: Same  ??  Procedure: Right forearm decompression fasciotomy volar compartment.    3/1:  ??  Post-operative Diagnosis: Same  ??  Procedure: [place] and fluoro RIJ tunneled HD catheter placement.    Assessment on Discharge: Stable, improved , depressed    Discharge ROS:  A limited review of systems was asked and negative except for hungry today, will not make eye contact or answer a lot of questions    Discharge Exam:  Visit Vitals   ??? BP (!) 121/57   ??? Pulse 103   ??? Temp 98.5 ??F (36.9 ??C) (Oral)   ??? Resp 18   ??? Ht 6\' 5"  (1.956 m)   ??? Wt 173 lb 4.5 oz (78.6 kg)   ??? SpO2 96%   ??? BMI 20.55 kg/m2       Gen: NAD  HEENT: NC/AT, moist mucous membranes, no oropharyngeal erythema or exudate  Neck: supple, trachea midline, no anterior cervical or SC LAD  Heart:  Normal s1/s2, RRR, no murmurs, gallops, or rubs. R leg edema  Lungs:  diminished bilaterally, no wheeze,no rales or rhonchi, no use of accessory muscles  Abd: bowel sounds present, soft, nontender, nondistended, no masses  Extrem:  No clubbing, cyanosis,  R leg edema  Skin: no lesion or masses, wound on R leg and R arm from fasciotomies  Psych:  A & O x3  Neuro:  grossly intact, moves all four extremities    Pertinent Studies During Hospital Stay:  Radiology:  Xr Humerus Right  Standard    Result Date: 07/18/2015  EXAMINATION: AP AND LATERAL VIEWS OF THE RIGHT HUMERUS 07/18/2015 12:26 pm COMPARISON: None. HISTORY: ORDERING SYSTEM PROVIDED HISTORY: redness and swelling TECHNOLOGIST PROVIDED HISTORY: Ordering Physician Provided Reason for Exam: reddness and swelling mid arm to elbow Acuity: Acute Type of Exam: Initial Mechanism of Injury: found down/possible seizure FINDINGS: There is no evidence of acute fracture.  There is normal alignment.  No acute joint abnormality.  No focal osseous lesion. No focal soft tissue abnormality.     No acute osseous abnormality.     Ct Head Wo Contrast    Result Date: 07/18/2015  EXAMINATION: CT OF THE HEAD WITHOUT CONTRAST  07/18/2015 1:00 pm TECHNIQUE: CT of the head was performed without the administration of intravenous contrast. Dose modulation, iterative reconstruction, and/or weight based adjustment of the mA/kV was utilized to reduce the radiation dose to as low as reasonably achievable. COMPARISON: CT head July 11, 2015. HISTORY: ORDERING SYSTEM PROVIDED HISTORY: HEAD TRAUMA, CLOSED, MILD, GCS >= 13, NO RISK FACTORS, NEURO EXAM NORMAL TECHNOLOGIST PROVIDED HISTORY: Ordering Physician Provided Reason for Exam: poss seizure Acuity: Acute Type of Exam: Initial FINDINGS: BRAIN/VENTRICLES: There is no acute intracranial hemorrhage, mass effect or midline shift.  No abnormal extra-axial fluid collection.  Small area of encephalomalacia is again seen within the left frontal and temporal lobes. The gray-white differentiation is maintained without evidence of an acute infarct.  There is no evidence of hydrocephalus. ORBITS: The visualized portion of the orbits demonstrate no acute abnormality. SINUSES: There is new partial opacification of the right maxillary sinus with an air-fluid level.  Remainder paranasal sinuses are well aerated. SOFT  TISSUES/SKULL:  There is mild soft tissue swelling in the right supraorbital region.  No underlying skull fracture or acute calvarial abnormality identified.     Soft tissue swelling right supraorbital region.  No evidence of intracranial hemorrhage or mass effect. Chronic small areas of encephalomalacia in the left frontal and temporal lobes. New air-fluid level within the right maxillary sinus which may be related to sinusitis.  Given the patient's history, traumatic etiology not excluded.  If clinically indicated CT facial bones may be helpful for further evaluation.     Ct Head Wo Contrast    Result Date: 07/11/2015  EXAMINATION: CT OF THE HEAD WITHOUT CONTRAST  07/11/2015 9:40 pm TECHNIQUE: CT of the head was performed without the administration of intravenous contrast. Dose modulation, iterative reconstruction, and/or weight based adjustment of the mA/kV was utilized to reduce the radiation dose to as low as reasonably achievable. COMPARISON: 09/15/2013 HISTORY: ORDERING SYSTEM PROVIDED HISTORY: seizure Initial evaluation. FINDINGS: BRAIN/VENTRICLES: Bifrontal encephalomalacia, left greater than right. Anterior left temporal lobe encephalomalacia is also noted.  No evidence of mass effect or midline shift.  No evidence of hydrocephalus.  No gross acute hemorrhage.  Gray matter white matter differentiation is preserved. ORBITS: The visualized portion of the orbits demonstrate no acute abnormality. SINUSES: The visualized paranasal sinuses and mastoid air cells demonstrate no acute abnormality. SOFT TISSUES/SKULL:  Soft tissue swelling is seen of the posterior scalp near the vertex.     Bifrontal and left temporal lobe encephalomalacia.  No gross acute process. Soft tissue swelling of the posterior scalp near the vertex.     Xr Chest Portable    Result Date: 07/27/2015  EXAMINATION: SINGLE VIEW OF THE CHEST 07/27/2015 8:17 am COMPARISON: 07/19/2015 HISTORY: ORDERING SYSTEM PROVIDED HISTORY: leukocytosis TECHNOLOGIST  PROVIDED HISTORY: Ordering Physician Provided Reason for Exam: sob Acuity: Acute Type  of Encounter: Subsequent/Follow-up Relevant Medical/Surgical History: heroin abuse Initial evaluation FINDINGS: Monitor wires overlie the chest.  There is a right internal jugular central venous line with its tip overlying the superior vena cava.  The trachea is midline.  The cardiac silhouette is unremarkable. The lungs are clear.  There is dextroscoliosis of the thoracic spine.  No acute pulmonary process is appreciated.     No acute pulmonary process.     Xr Chest Portable    Result Date: 07/19/2015  EXAMINATION: SINGLE VIEW OF THE CHEST 07/19/2015 10:18 am COMPARISON: 07/18/2015. HISTORY: ORDERING SYSTEM PROVIDED HISTORY: vascath replacement TECHNOLOGIST PROVIDED HISTORY: Ordering Physician Provided Reason for Exam: vascath replacement Acuity: Acute Type of Exam: Initial FINDINGS: Right IJ Vas-Cath has been exchanged.  The tip of the catheter overlies the cavoatrial junction.  There is no pneumothorax, failure or effusion.     Line exchange without complicating feature.     Xr Chest Portable    Result Date: 07/18/2015  EXAMINATION: SINGLE VIEW OF THE CHEST 07/18/2015 9:56 pm COMPARISON: Previous the same day HISTORY: ORDERING SYSTEM PROVIDED HISTORY: Line placement TECHNOLOGIST PROVIDED HISTORY: Ordering Physician Provided Reason for Exam: line placement, intubated s/p vas cath Acuity: Acute Type of Exam: Initial FINDINGS: There has been interval placement of a right internal jugular approach vascular catheter.  The tip projects over the right atrium in expected position. No pneumothorax or immediate postprocedure complication appreciated. Endotracheal tube projects in expected position approximately 4.5 cm above the carina.  NG tube projects with the tip terminating in the distal esophagus.  This should be advanced or removed. Lungs are clear. Osseous structures demonstrate no acute abnormality.     NG tube projects with the tip  in the distal esophagus.  This should be advanced or removed. ETT and right internal jugular vascular catheter appear to be an expected position. Lungs areclear. The findings were sent to the Radiology Results Communication Center at 11:10 pm on 2/24/2017to be communicated to a licensed caregiver.     Xr Chest Portable    Result Date: 07/18/2015  EXAMINATION: SINGLE VIEW OF THE CHEST 07/18/2015 12:26 pm COMPARISON: 06/27/2014. HISTORY: ORDERING SYSTEM PROVIDED HISTORY: pain TECHNOLOGIST PROVIDED HISTORY: Ordering Physician Provided Reason for Exam: blisters lat mid chest wall Acuity: Acute Type of Exam: Initial Mechanism of Injury: found down lying on rt arm,unsure how long down FINDINGS: The lungs are without acute focal process.  There is no effusion or pneumothorax. The cardiomediastinal silhouette is stable. The osseous structures are stable.     No acute process.     Fl Vascular Access Guidance    Result Date: 07/18/2015  EXAMINATION: Spot film from fluoroscopy from catheter placement 07/18/2015 8:45 pm COMPARISON: None. HISTORY: ORDERING SYSTEM PROVIDED HISTORY: access TECHNOLOGIST PROVIDED HISTORY: Ordering Physician Provided Reason for Exam: vascular access in or Acuity: Acute Type of Exam: Initial Additional signs and symptoms: vas cath placement in or FLUOROSCOPY DOSE AND TYPE OR TIME AND EXPOSURES: 30 seconds fluoroscopy FINDINGS: Images were obtained without a radiologist in attendance.  Multiple wires overlie the patient.  Wire or lying projects in the region of the cavoatrial junction.     Intraoperative spot film as above     Ir Insert Tunneled Cath W Port    Result Date: 07/23/2015  PROCEDURE: ULTRASOUND GUIDED VASCULAR ACCESS. FLUOROSCOPY GUIDED PLACEMENT OF A TUNNELED CATHETER. MODERATE CONSCIOUS SEDATION 07/23/2015. HISTORY: ORDERING SYSTEM PROVIDED HISTORY: Needs HD TECHNOLOGIST PROVIDED HISTORY: Ordering Physician Provided Reason for Exam: permacath insert Acuity: Acute Type of  Encounter: Initial  SEDATION: 1 mgVersed was given intravenously for moderate sedation monitored under my direction.  Total intraservice time of sedation was 30 minutes.  The patient's vital signs were monitored throughout the procedure and recorded in the patient's medical record by the nurse. FLUOROSCOPY DOSE AND TYPE OR TIME AND EXPOSURES: 2.6 minutes, 1 image TECHNIQUE: Informed consent was obtained after a detailed explanation of the procedure including risks, benefits, and alternatives. Universal protocol was observed. The patient received IV antibiotics on floor.  The right neck and chest were prepped and draped in sterile fashion using maximum sterile barrier technique. Local anesthesia was achieved with lidocaine. A micropuncture needle was used to access the right internal jugular vein using ultrasound guidance.  An ultrasound image demonstrating patency of the vein with needle tip located within it. An image was obtained and stored in PACs.  A 0.035 guidewire was used to place a peel-away sheath.  A subcutaneous tunnel was created to the infraclavicular region and a tunneled hemodialysis catheter was pulled through the subcutaneous tunnel to the venotomy site and advanced through the peel-away sheath under fluoroscopic guidance to the right atrium. The catheter flushed easily and there was a good blood return.  The catheter was sutured to the skin.  The catheter was locked with heparinized saline. The patient tolerated the procedure well and there were no immediate complications. FINDINGS: Fluoroscopic image demonstrates the tip of the catheter in the right atrium.     Successful ultrasound and fluoroscopy guided placement of a 19 cm tip to cuff hemodialysis catheter via the right internal jugular vein.       Blood cx and MRSA probe both negative    Last Labs on Discharge:     Recent Results (from the past 24 hour(s))   CK    Collection Time: 07/28/15  4:29 AM   Result Value Ref Range    Total CK 3651 (H) 39 - 308 U/L    Hepatic Function Panel    Collection Time: 07/28/15  4:29 AM   Result Value Ref Range    Total Protein 4.9 (L) 6.4 - 8.2 g/dL    Alb 2.1 (L) 3.4 - 5.0 g/dL    Alkaline Phosphatase 44 40 - 129 U/L    ALT 49 (H) 10 - 40 U/L    AST 117 (H) 15 - 37 U/L    Total Bilirubin 0.3 0.0 - 1.0 mg/dL    Bilirubin, Direct <1.6 0.0 - 0.3 mg/dL    Bilirubin, Indirect see below 0.0 - 1.0 mg/dL   CBC Auto Differential    Collection Time: 07/28/15  4:29 AM   Result Value Ref Range    WBC 11.6 (H) 4.0 - 11.0 K/uL    RBC 2.43 (L) 4.20 - 5.90 M/uL    Hemoglobin 7.3 (L) 13.5 - 17.5 g/dL    Hematocrit 10.9 (L) 40.5 - 52.5 %    MCV 92.5 80.0 - 100.0 fL    MCH 30.0 26.0 - 34.0 pg    MCHC 32.4 31.0 - 36.0 g/dL    RDW 60.4 (H) 54.0 - 15.4 %    Platelets 158 135 - 450 K/uL    MPV 9.5 5.0 - 10.5 fL    Path Consult No     Neutrophils % 69.9 %    Lymphocytes Relative 13.1 %    Monocytes % 13.5 %    Eosinophils Relative Percent 3.0 %    Basophils % 0.5 %    Neutrophils # 8.1 (  H) 1.7 - 7.7 K/uL    Lymphocytes # 1.5 1.0 - 5.1 K/uL    Monocytes # 1.6 (H) 0.0 - 1.3 K/uL    Eosinophils # 0.3 0.0 - 0.6 K/uL    Basophils # 0.1 0.0 - 0.2 K/uL   Basic Metabolic Panel    Collection Time: 07/28/15  4:29 AM   Result Value Ref Range    Sodium 134 (L) 136 - 145 mmol/L    Potassium 6.7 (HH) 3.5 - 5.1 mmol/L    Chloride 97 (L) 99 - 110 mmol/L    CO2 21 21 - 32 mmol/L    Anion Gap 16 3 - 16    Glucose 115 (H) 70 - 99 mg/dL    BUN 67 (H) 7 - 20 mg/dL    CREATININE 7.5 (HH) 0.9 - 1.3 mg/dL    GFR Non-African American 9 (A) >60    GFR African American 11 (A) >60    Calcium 7.9 (L) 8.3 - 10.6 mg/dL   Magnesium    Collection Time: 07/28/15  4:29 AM   Result Value Ref Range    Magnesium 2.50 (H) 1.80 - 2.40 mg/dL   Phosphorus    Collection Time: 07/28/15  4:29 AM   Result Value Ref Range    Phosphorus 7.6 (H) 2.5 - 4.9 mg/dL   Hepatitis B Surface Antibody    Collection Time: 07/28/15 10:31 AM   Result Value Ref Range    Hep B S Ab 489.90 mUl/mL   Hepatitis B Surface  Antigen    Collection Time: 07/28/15 10:31 AM   Result Value Ref Range    Hep B S Ag Interp Non-reactive Non-reactive   TYPE AND SCREEN    Collection Time: 07/28/15 11:35 AM   Result Value Ref Range    ABO/Rh O POS     Antibody Screen NEG    PREPARE RBC (CROSSMATCH) Number of Units: 1, 1 Units    Collection Time: 07/28/15 11:35 AM   Result Value Ref Range    Product Code Blood Bank [id number]     Description Blood Bank Red Blood Cells, Apheresis, Leuko-reduced     Unit Number [id number]     Dispense Status Blood Bank selected          Follow up: with NISAR FATIMA HAQ, MD    Note that over 30 minutes was spent in preparing discharge papers, discussing discharge with patient, medication review, etc.    Thank you NISAR FATIMA HAQ, MD for the opportunity to be involved in this patient's care. If you have any questions or concerns please feel free to contact me at [phone].    Electronically signed by Markus Jarvis, MD on 07/28/2015 at 5:13 PM

## 2015-07-28 NOTE — Plan of Care (Signed)
Problem: Pain:  Goal: Control of acute pain  Control of acute pain   Outcome: Ongoing  Pt rating pain 5/10 upon transfer to floor at 0435. Denied any issues with pain during medication administration at 0640. Pt verbalized understanding to call RN if pain medication needed.

## 2015-07-28 NOTE — Other (Signed)
Pt went to dialysis from 0900 to 1430.  Upon returning, extensive wound vac dressings were placed on patient from 1530 to 1700. 1 unit of blood transfusing now.  Vitals are stable and patient is tolerating well.   Pt has a discharge order in and is scheduled for a 1000 am pickup on 07/29/15 and [place] [facility].

## 2015-07-29 LAB — CBC WITH AUTO DIFFERENTIAL
Basophils %: 0.7 %
Basophils Absolute: 0.1 10*3/uL (ref 0.0–0.2)
Eosinophils %: 2.8 %
Eosinophils Absolute: 0.3 10*3/uL (ref 0.0–0.6)
Hematocrit: 20.7 % — CL (ref 40.5–52.5)
Hemoglobin: 7.1 g/dL — ABNORMAL LOW (ref 13.5–17.5)
Lymphocytes %: 15.4 %
Lymphocytes Absolute: 1.6 10*3/uL (ref 1.0–5.1)
MCH: 31.1 pg (ref 26.0–34.0)
MCHC: 34.2 g/dL (ref 31.0–36.0)
MCV: 90.9 fL (ref 80.0–100.0)
MPV: 8.7 fL (ref 5.0–10.5)
Monocytes %: 13.1 %
Monocytes Absolute: 1.4 10*3/uL — ABNORMAL HIGH (ref 0.0–1.3)
Neutrophils %: 68 %
Neutrophils Absolute: 7.2 10*3/uL (ref 1.7–7.7)
Platelets: 200 10*3/uL (ref 135–450)
RBC: 2.28 M/uL — ABNORMAL LOW (ref 4.20–5.90)
RDW: 16 % — ABNORMAL HIGH (ref 12.4–15.4)
WBC: 10.5 10*3/uL (ref 4.0–11.0)

## 2015-07-29 LAB — RENAL FUNCTION PANEL
Anion Gap: 14 (ref 3–16)
BUN: 44 mg/dL — ABNORMAL HIGH (ref 7–20)
CO2: 24 mmol/L (ref 21–32)
Calcium: 7.6 mg/dL — ABNORMAL LOW (ref 8.3–10.6)
Chloride: 95 mmol/L — ABNORMAL LOW (ref 99–110)
Creatinine: 5.5 mg/dL (ref 0.9–1.3)
GFR African American: 15 — AB (ref >60)
GFR Non-African American: 12 — AB (ref >60)
Glucose: 112 mg/dL — ABNORMAL HIGH (ref 70–99)
Phosphorus: 5.3 mg/dL — ABNORMAL HIGH (ref 2.5–4.9)
Potassium: 4.6 mmol/L (ref 3.5–5.1)
Sodium: 133 mmol/L — ABNORMAL LOW (ref 136–145)

## 2015-07-29 LAB — HEPATIC FUNCTION PANEL
ALT: 41 U/L — ABNORMAL HIGH (ref 10–40)
AST: 77 U/L — ABNORMAL HIGH (ref 15–37)
Albumin: 1.8 g/dL — ABNORMAL LOW (ref 3.4–5.0)
Alkaline Phosphatase: 45 U/L (ref 40–129)
Bilirubin, Direct: <0.2 mg/dL (ref 0.0–0.3)
Total Bilirubin: 0.3 mg/dL (ref 0.0–1.0)
Total Protein: 4.6 g/dL — ABNORMAL LOW (ref 6.4–8.2)

## 2015-07-29 LAB — CK: Total CK: 2204 U/L — ABNORMAL HIGH (ref 39–308)

## 2015-07-29 LAB — HEPATITIS B CORE ANTIBODY, TOTAL: Hep B Core Total Ab: NEGATIVE

## 2015-07-29 LAB — MAGNESIUM: Magnesium: 2.2 mg/dL (ref 1.80–2.40)

## 2015-07-29 MED FILL — DOK 100 MG PO CAPS: 100 MG | ORAL | Qty: 1

## 2015-07-29 MED FILL — SODIUM CHLORIDE 0.9 % IV SOLN: 0.9 % | INTRAVENOUS | Qty: 2000

## 2015-07-29 MED FILL — OXYCODONE-ACETAMINOPHEN 5-325 MG PO TABS: 5-325 MG | ORAL | Qty: 2

## 2015-07-29 MED FILL — MEGESTROL ACETATE 400 MG/10ML PO SUSP: 400 MG/10ML | ORAL | Qty: 10

## 2015-07-29 MED FILL — LEVETIRACETAM 500 MG PO TABS: 500 MG | ORAL | Qty: 1

## 2015-07-29 MED FILL — NORMAL SALINE FLUSH 0.9 % IV SOLN: 0.9 % | INTRAVENOUS | Qty: 10

## 2015-07-29 NOTE — Other (Signed)
[id number] Critical Hematocrit=20.7 Hemoglobin=7.1.   0544 Dr Marney DoctorKeagan notified. No new orders received at this time.

## 2015-07-29 NOTE — Care Coordination-Inpatient (Signed)
[facility] Ostomy Continence Nurse  Follow-up Progress Note       NAME:  Roy Weaver  MEDICAL RECORD NUMBER:  [id number]  AGE:  30 y.o.   GENDER:  male  DOB:  April 11, 1986  TODAY'S DATE:  07/29/2015    Subjective:   Called to patients room due NWPT alarms   NWPT cannister full and drainage from wound backing up into NWPT dressing.   Multiple attempts were made with wound vac drape, trac pad exchange, adding more black foam to correct right upper thigh and bridge together calf wound seal. High leak noted with compressed dressing with pressures maintaining from 125-150 mmhg. Vernona Rieger [facility] nurse called to assess dressing for   Right medial dressing seal on second NWPT machine adequate with pressures at 175 mmhg.  Spoke with [facility] nurse at [facility] about right upper thigh and calf dressing. WOC nurse at [facility] asked if she wanted right lateral leg dressings removed and moist dressing applied. WOC nurse agreed to sending patient connected to NWPT machines and she would try to troubleshooting dressing on arrival to facility.  Pt transported to [place] with 2 NWPT machines [employer] rep and supply chain notified. Serial #: [id number] and [id number]    Spoke with bedside nurse Consuella Lose and patient about plan of care.       Wound Identification:  Wound Type: non-healing surgical  Contributing Factors: edema, chronic pressure, decreased mobility, shear force, malnutrition, incontinence of stool, incontinence of urine, non-adherence and substance abuse        Patient Goal of Care:   Wound Healing   Odor Control   Palliative Care   Pain Control    Other:     Objective:      Visit Vitals   ??? BP 126/65   ??? Pulse 91   ??? Temp 98.6 ??F (37 ??C) (Oral)   ??? Resp 20   ??? Ht  (1.956 m)   ??? Wt 173 lb 4.5 oz (78.6 kg)   ??? SpO2 95%   ??? BMI 20.55 kg/m2     Braden Risk Score: Braden Scale Score: 15 at risk   Assessment:   Measurements:  Negative Pressure Wound Therapy Leg Right;Lateral;Medial;Lower (Active)    Wound Type Surgical 07/28/2015  7:45 PM   Assessment UTA 07/28/2015  6:53 PM   Peri-wound Assessment UTA 07/28/2015  6:53 PM   Shape see below  07/21/2015  4:22 PM   Unit Type KCL vac 07/28/2015  7:45 PM   Dressing Type Other (Comment) 07/28/2015  6:53 PM   Number of pieces used 8 07/28/2015  5:22 PM   Cycle On;Continuous 07/28/2015  7:45 PM   Target Pressure (mmHg) 175 07/28/2015  7:45 PM   Canister changed? Yes 07/28/2015  5:22 PM   Dressing Status Intact 07/28/2015  7:45 PM   Dressing Changed Changed/New 07/28/2015  5:22 PM   Drainage Amount Large 07/28/2015  6:53 PM   Drainage Description Serosanguinous 07/28/2015  6:53 PM   Dressing Change Due 07/28/15 07/28/2015  6:53 PM   Odor None 07/28/2015  6:53 PM   Output (ml) 450 ml 07/29/2015  3:22 AM   Number of days:7       Response to treatment:  Poorly tolerated by patient., With complaints of pain.     Pain Assessment:  Severity:  10 / 10  Quality of pain: sharp, burning, shooting, tender  Wound Pain Timing/Severity: constant  Premedicated: Yes  Plan:   Plan of Care: Incision 07/18/15 Leg  Right;Lateral-Dressing/Treatment: Vacuum dressing  Incision 07/18/15 Arm Right;Lower-Dressing/Treatment: Moist to dry, Ace Wrap  Incision 07/21/15 Leg Right;Lower-Dressing/Treatment: Vacuum dressing  Incision 07/21/15 Leg Right;Medial-Dressing/Treatment: Vacuum dressing     1. Implement Braden prevention orders. See orders for prevention order details   2. Implement wound care orders. See wound care orders for dressing change instructions   Right lateral/ medial thigh and calf: NWPT   3. [facility] now by ambulance      Specialty Bed Required : Yes   []  Low Air Loss   []  Pressure Redistribution  [x]  Fluid Immersion: Dolphin   []  Bariatric  []  Total Pressure Relief  []  Other:     Current Diet: DIET RENAL;  Dietary Nutrition Supplements: Clear Liquid Oral Supplement  Dietician consult:  Yes    Discharge Plan:  Placement for patient upon discharge: intermediate care facility   Patient  appropriate for Outpatient Wound Care Center: Yes    Referrals:  []  Social Worker  []  Home Health Care  []  Supplies  []  Other    Patient/Caregiver Teaching:  Level of patient/caregiver understanding able to: Spoke with bedside Consuella LoseElaine about plan of care   []  Indicates understanding       [x]  Needs reinforcement  []  Unsuccessful      []  Verbal Understanding  []  Demonstrated understanding       []  No evidence of learning  []  Refused teaching         []  N/A       Electronically signed by Rebbeca PaulFarsha' Detre' Earle Troiano, RN, CWOCN on 07/29/2015 at 10:54 AM

## 2015-07-29 NOTE — Progress Notes (Addendum)
Report called [facility]. [facility].

## 2015-09-22 ENCOUNTER — Encounter: Admit: 2015-09-22 | Primary: Internal Medicine

## 2015-09-22 ENCOUNTER — Inpatient Hospital Stay
Admit: 2015-09-22 | Discharge: 2015-09-23 | Disposition: A | Discharge status: Home / Self Care | Payer: PRIVATE HEALTH INSURANCE | Attending: Emergency Medicine

## 2015-09-22 DIAGNOSIS — R569 Unspecified convulsions: Secondary | ICD-10-CM

## 2015-09-22 LAB — CBC WITH AUTO DIFFERENTIAL
Basophils %: 0.4 %
Basophils Absolute: 0 10*3/uL (ref 0.0–0.2)
Eosinophils %: 0.2 %
Eosinophils Absolute: 0 10*3/uL (ref 0.0–0.6)
Hematocrit: 33.9 % — ABNORMAL LOW (ref 40.5–52.5)
Hemoglobin: 11.3 g/dL — ABNORMAL LOW (ref 13.5–17.5)
Lymphocytes %: 14.7 %
Lymphocytes Absolute: 1 10*3/uL (ref 1.0–5.1)
MCH: 29.8 pg (ref 26.0–34.0)
MCHC: 33.2 g/dL (ref 31.0–36.0)
MCV: 89.5 fL (ref 80.0–100.0)
MPV: 7.8 fL (ref 5.0–10.5)
Monocytes %: 7 %
Monocytes Absolute: 0.5 10*3/uL (ref 0.0–1.3)
Neutrophils %: 77.7 %
Neutrophils Absolute: 5.1 10*3/uL (ref 1.7–7.7)
Platelets: 275 10*3/uL (ref 135–450)
RBC: 3.78 M/uL — ABNORMAL LOW (ref 4.20–5.90)
RDW: 13.9 % (ref 12.4–15.4)
WBC: 6.5 10*3/uL (ref 4.0–11.0)

## 2015-09-22 LAB — COMPREHENSIVE METABOLIC PANEL
ALT: 12 U/L (ref 10–40)
AST: 18 U/L (ref 15–37)
Albumin/Globulin Ratio: 1.2 (ref 1.1–2.2)
Albumin: 4.2 g/dL (ref 3.4–5.0)
Alkaline Phosphatase: 78 U/L (ref 40–129)
Anion Gap: 18 — ABNORMAL HIGH (ref 3–16)
BUN: 6 mg/dL — ABNORMAL LOW (ref 7–20)
CO2: 22 mmol/L (ref 21–32)
Calcium: 9.6 mg/dL (ref 8.3–10.6)
Chloride: 106 mmol/L (ref 99–110)
Creatinine: 0.7 mg/dL — ABNORMAL LOW (ref 0.9–1.3)
GFR African American: >60 (ref >60)
GFR Non-African American: >60 (ref >60)
Globulin: 3.4 g/dL
Glucose: 94 mg/dL (ref 70–99)
Potassium: 3.5 mmol/L (ref 3.5–5.1)
Sodium: 146 mmol/L — ABNORMAL HIGH (ref 136–145)
Total Bilirubin: 0.3 mg/dL (ref 0.0–1.0)
Total Protein: 7.6 g/dL (ref 6.4–8.2)

## 2015-09-22 LAB — LEVETIRACETAM LEVEL: Levetiracetam Lvl: <2 ug/mL — ABNORMAL LOW (ref 6.0–46.0)

## 2015-09-22 LAB — LACTIC ACID: Lactic Acid: 2 mmol/L (ref 0.4–2.0)

## 2015-09-22 LAB — CK: Total CK: 322 U/L — ABNORMAL HIGH (ref 39–308)

## 2015-09-22 MED ORDER — LORAZEPAM 2 MG/ML IJ SOLN
2 MG/ML | Freq: Once | INTRAMUSCULAR | Status: AC
Start: 2015-09-22 — End: 2015-09-22
  Administered 2015-09-22: 20:00:00 2 mg via INTRAMUSCULAR

## 2015-09-22 MED ORDER — LEVETIRACETAM IN NACL 500 MG/100ML IV SOLN
500 MG/100ML | Freq: Once | INTRAVENOUS | Status: AC
Start: 2015-09-22 — End: 2015-09-22
  Administered 2015-09-22: 22:00:00 500 mg via INTRAVENOUS

## 2015-09-22 MED ORDER — SODIUM CHLORIDE 0.9 % IV BOLUS
0.9 % | Freq: Once | INTRAVENOUS | Status: AC
Start: 2015-09-22 — End: 2015-09-22
  Administered 2015-09-22: 21:00:00 1000 mL via INTRAVENOUS

## 2015-09-22 MED FILL — LEVETIRACETAM IN NACL 500 MG/100ML IV SOLN: 500 MG/100ML | INTRAVENOUS | Qty: 100

## 2015-09-22 MED FILL — LORAZEPAM 2 MG/ML IJ SOLN: 2 MG/ML | INTRAMUSCULAR | Qty: 1

## 2015-09-22 MED FILL — SODIUM CHLORIDE 0.9 % IV SOLN: 0.9 % | INTRAVENOUS | Qty: 1000

## 2015-09-22 NOTE — ED Notes (Signed)
Bed: B-07  Expected date:   Expected time:   [facility]  Comments:  2919m Seizure       Charleston RopesCrystal Speier, RN  09/22/15 [phone]

## 2015-09-22 NOTE — ED Provider Notes (Signed)
[facility] EMERGENCY DEPT  eMERGENCY dEPARTMENT eNCOUnter        Pt Name: Roy Weaver  MRN: [id number]  Birthdate 1986/05/16  Date of evaluation: 09/22/2015  Provider: Cindee Salt, PA  PCP: Baldwin Jamaica, MD  ED Attending: San Jetty    CHIEF COMPLAINT       Chief Complaint   Patient presents with   ??? Seizures     found by home health RN. EMS states postictle on arrival       HISTORY OF PRESENT ILLNESS   (Location/Symptom, Timing/Onset, Context/Setting, Quality, Duration, Modifying Factors, Severity)  Note limiting factors.     Roy Weaver is a 30 y.o. male history of heroin abuse and seizures on Keppra presents to the ED via squad for evaluation of seizure.  HPI comes from nurse who states that home health nurse came to see patient for the first time today after he was discharged from [facility] rehab, found to be post ictal.  And states that he has had a seizure today and yesterday otherwise has not had one since recent hospitalization.  I reviewed prior records, he was recently admitted February, found down, admitted with AKI, rhabdomyolysis, compartment syndrome R forearm, leg s/p decompression on 07/18/15. He was admited to rehab care at [facility].  She denies fevers, chills, cough, abdominal pain, nausea vomiting diarrhea.  No dysuria or increased urgency frequency or hematuria.  He is post ictal and states that he does not feel good otherwise has no specific complaints.    Nursing Notes were all reviewed and agreed with or any disagreements were addressed  in the HPI.    REVIEW OF SYSTEMS    (2-9 systems for level 4, 10 or more for level 5)     Review of Systems   Constitutional: Negative for chills, fatigue and fever.   HENT: Negative for congestion, drooling, ear pain, facial swelling, hearing loss and sore throat.    Eyes: Negative for pain.   Respiratory: Negative for cough and shortness of breath.    Cardiovascular: Negative for chest pain.   Gastrointestinal: Negative for abdominal pain,  diarrhea, nausea and vomiting.   Genitourinary: Negative for dysuria.   Musculoskeletal: Negative for back pain, neck pain and neck stiffness.   Skin: Negative for rash.   Neurological: Positive for seizures. Negative for dizziness and headaches.   Psychiatric/Behavioral: Negative for confusion.       Positives and Pertinent negatives as per HPI.  Except as noted above in the ROS, all other systems were reviewed and negative.       PAST MEDICAL HISTORY     Past Medical History:   Diagnosis Date   ??? AKI (acute kidney injury) (HCC)    ??? Hepatitis C 07/23/2015   ??? Heroin abuse    ??? Migraine    ??? Psychiatric problem    ??? Seizures (HCC)    ??? Traumatic rhabdomyolysis (HCC)          SURGICAL HISTORY     History reviewed. No pertinent surgical history.      CURRENT MEDICATIONS       Discharge Medication List as of 09/22/2015  8:42 PM      CONTINUE these medications which have NOT CHANGED    Details   fentaNYL (SUBLIMAZE) 100 MCG/2ML injection Infuse 1 mL intravenously every 4 hours as needed (severe pain) ., Disp-2 mLPrint      oxyCODONE-acetaminophen (PERCOCET) 5-325 MG per tablet Take 1 tab by mouth every four  hours as needed for moderate pain or 2 tabs by mouth every 4 hours as needed for severe pain., Disp-30 tablet, R-0NO PRINT      megestrol (MEGACE) 40 MG/ML suspension Take 5 mLs by mouth daily, Disp-240 mL, R-3DC to SNF      ondansetron (ZOFRAN) 4 MG/2ML injection Infuse 2 mLs intravenously every 6 hours as needed for Nausea or Vomiting, Disp-15 mLDC to SNF      !! sodium chloride flush 0.9 % injection Infuse 10 mLs intravenously as needed for Line CareDC to SNF      !! sodium chloride flush 0.9 % injection Infuse 10 mLs intravenously every 12 hoursDC to SNF      levETIRAcetam (KEPPRA) 500 MG tablet Take 1 tablet by mouth every 24 hours, Disp-60 tablet, R-3Normal      docusate sodium (COLACE, DULCOLAX) 100 MG CAPS Take 100 mg by mouth 2 times daily, Disp-20 capsule, R-0Print       !! - Potential duplicate medications  found. Please discuss with provider.            ALLERGIES     Review of patient's allergies indicates no known allergies.    FAMILY HISTORY       Family History   Problem Relation Age of Onset   ??? Diabetes Father           SOCIAL HISTORY       Social History     Social History   ??? Marital status: Single     Spouse name: N/A   ??? Number of children: 0   ??? Years of education: 50     Social History Main Topics   ??? Smoking status: Current Some Day Smoker     Packs/day: 1.00     Years: 2.00     Types: Cigarettes   ??? Smokeless tobacco: Former Neurosurgeon     Quit date: 10/04/2013   ??? Alcohol use 0.0 oz/week     0 Standard drinks or equivalent per week      Comment: occasioanly   ??? Drug use: Yes     Special: Marijuana, IV      Comment: smokes once every 3 weeks used heroin an hour ago    ??? Sexual activity: Not Currently     Other Topics Concern   ??? None     Social History Narrative    ** Merged History Encounter **            SCREENINGS             PHYSICAL EXAM    (up to 7 for level 4, 8 or more for level 5)   ED Triage Vitals   BP Temp Temp Source Pulse Resp SpO2 Height Weight   09/22/15 1459 09/22/15 1459 09/22/15 1459 09/22/15 1459 09/22/15 1459 09/22/15 1459 -- 09/22/15 1459   116/74 99.9 ??F (37.7 ??C) Oral 120 12 99 %  158 lb 15.2 oz (72.1 kg)       Physical Exam   Constitutional: He is oriented to person, place, and time. He appears well-developed. No distress.   HENT:   Head: Normocephalic and atraumatic.   Eyes: EOM are normal. Pupils are equal, round, and reactive to light. Right eye exhibits no discharge. Left eye exhibits no discharge.   Neck: Normal range of motion. Neck supple.   Pulmonary/Chest: No stridor. No respiratory distress.   Musculoskeletal: Normal range of motion.   Neurological: He is alert and oriented to person, place,  and time. He exhibits normal muscle tone.   No gross facial drooping. Moves all 4 extremities spontaneously.   Skin: Skin is warm and dry. He is not diaphoretic. No pallor.   Psychiatric:  He has a normal mood and affect. His behavior is normal.   Nursing note and vitals reviewed.      DIAGNOSTIC RESULTS   LABS:    Labs Reviewed   CBC WITH AUTO DIFFERENTIAL - Abnormal; Notable for the following:        Result Value    RBC 3.78 (*)     Hemoglobin 11.3 (*)     Hematocrit 33.9 (*)     All other components within normal limits   COMPREHENSIVE METABOLIC PANEL - Abnormal; Notable for the following:     Sodium 146 (*)     Anion Gap 18 (*)     BUN 6 (*)     CREATININE 0.7 (*)     All other components within normal limits   LEVETIRACETAM LEVEL - Abnormal; Notable for the following:     Levetiracetam Lvl <2.0 (*)     All other components within normal limits   CK - Abnormal; Notable for the following:     Total CK 322 (*)     All other components within normal limits   CULTURE BLOOD #1   CULTURE BLOOD #2   LACTIC ACID, PLASMA       All other labs were within normal range or not returned as of this dictation.    EKG: All EKG's are interpreted by the Emergency Department Physician who either signs or Co-signs this chart in the absence of a cardiologist.  Please see their note for interpretation of EKG.      RADIOLOGY:   Non-plain film images such as CT, Ultrasound and MRI are read by the radiologist. Plain radiographic images are visualized and preliminarily interpreted by the  ED Provider with the below findings:        Interpretation per the Radiologist below, if available at the time of this note:    XR Chest Portable   Final Result   1.  No acute abnormality.           No results found.      PROCEDURES   Unless otherwise noted below, none     Procedures    CRITICAL CARE TIME   N/A    CONSULTS:  None      EMERGENCY DEPARTMENT COURSE and DIFFERENTIAL DIAGNOSIS/MDM:   Vitals:    Vitals:    09/22/15 1743 09/22/15 1801 09/22/15 1821 09/22/15 2000   BP: 103/72 119/85 131/83 121/68   Pulse:    98   Resp:    16   Temp:       TempSrc:       SpO2:    98%   Weight:           Patient was given the following  medications:  Medications   0.9 % sodium chloride bolus (0 mLs Intravenous Stopped 09/22/15 1845)   LORazepam (ATIVAN) injection 2 mg (2 mg Intramuscular Given 09/22/15 1558)   levetiracetam (KEPPRA) 500 mg/100 mL IVPB (0 mg Intravenous Stopped 09/22/15 1835)       DDX: Electrolyte abnormality, Meningitis, Head injury, Drug overdose, Drug withdrawal, Status Epilepticus, Breakthrough Seizure, Intracranial Bleed, Brain Tumor, Hypoglycemia, neck fracture or other orthopedic fractures, posterior shoulder dislocation.    30 year old male with history of seizures on Keppra seen and evaluated as detailed  above for reported breakthrough seizure. Has been seen multiple times int he ED for seizures suspected d/t noncompliance. Pulse is 120, temperature 99.9 at triage. Poct ictal state upon arrival via squad. He does have multiple fasciotomy wounds on his lower extremities however there is no erythema foul drainage or evidence of infection.  Lungs are clear on exam and chest x-ray without infiltrate.  No abdominal tenderness. Work up in the emergency department reveals keppra levels are subtherapeutic, he was given a loading dose 500 mg IV.  Otherwise no evidence of infection, no leukocytosis or elevated lactate.  His CK is 322, which has improved since he was admitted last month for traumatic rhabdo levels greater than 10,000.  Feel that the patient had a breakthrough seizure due to subtherapeutic levels of his anticonvulsant medication, stressed the importance of compliance with Keppra as prescribed. Pulse rechecked to be 98 prior to d/c. At this time I believe patient's presentation does not warrant further workup with labs or imaging in the emergency department and is stable for discharge home. Patient will be discharged with instructions to follow up outpatient for reevaluation in the next few days, and to return to the emergency department for any worsening symptoms or further concerns. They verbalized understanding and were  discharged in stable condition.    The patient tolerated their visit well.  They were seen and evaluated by the attending physician, No att. providers found who agreed with the assessment and plan.  The patient and / or the family were informed of the results of any tests, a time was given to answer questions, a plan was proposed and they agreed with plan.        FINAL IMPRESSION      1. Seizures (HCC)    2. Seizure secondary to subtherapeutic anticonvulsant medication [facility](HCC)          DISPOSITION/PLAN   DISPOSITION     PATIENT REFERRED TO:  [name], MD  [address]  [place] [id number]  [phone]            DISCHARGE MEDICATIONS:  Discharge Medication List as of 09/22/2015  8:42 PM        Continue Keppra as prescribed 500 mg daily    DISCONTINUED MEDICATIONS:  Discharge Medication List as of 09/22/2015  8:42 PM                 (Please note that portions of this note were completed with a voice recognition program.  Efforts were made to edit the dictations but occasionally words are mis-transcribed.)    Cindee SaltGitte S Rasheena Talmadge, PA (electronically signed)            Cindee SaltGitte S Cloe Sockwell, PA  09/23/15 1037

## 2015-09-22 NOTE — ED Notes (Signed)
Attempt to call residents home and contact numbers but no answer.      Charleston Ropesrystal Zoya Sprecher, RN  09/22/15 [phone]

## 2015-09-22 NOTE — ED Notes (Signed)
Pt presented to ED via EMS. EMS states home health RN went to pt house for first visit and states pt was having seizure. EMS states pt was postictal on arrival. EMS states pt has been non-compliant. Pt states hx seizures. Pt denies any miss seizure medication.      Charleston Ropesrystal Jamee Pacholski, RN  09/22/15 1505

## 2015-09-22 NOTE — Progress Notes (Signed)
Culture reviewed, no further treatment needed

## 2015-09-22 NOTE — ED Provider Notes (Signed)
I independently performed a history and physical on Roy Weaver.   All diagnostic, treatment, and disposition decisions were made by myself in conjunction with the advanced practice provider.     Briefly, this is a 30 y.o. male here for seizure. Patient is on Keppra, but does have a frequent history of being here for noncompliance with his medications.    On exam, patient has a normal physical examination, and was mildly postictal when he first arrived.  The patient's Keppra level was 0, and we did reload him with Keppra here in the ED.  Patient was discharged in the care of his grandmother and sent home.    For further details of Roy Weaver's emergency department encounter, please see documentation by advanced practice provider [facility].       San JettyFrederick Lareen Mullings, MD  09/23/15 [phone]

## 2015-09-22 NOTE — ED Notes (Signed)
Spoke with pt mother Wynona CanesChristine.  Stated would come and pick pt up.     Philip AspenJulianne Tiwanda Threats, RN  09/22/15 1958

## 2015-09-22 NOTE — ED Notes (Signed)
Assumed care of pt.  Pt awaiting further evaluation.     Philip AspenJulianne Minerva Bluett, RN  09/22/15 [phone]

## 2015-09-22 NOTE — Progress Notes (Signed)
Preliminary result. Will await final result

## 2015-09-22 NOTE — ED Notes (Signed)
Attempt to start IV. Pt confused and slightly combative. Security at bedside. Pt presents with word salad. MD to room.     Charleston Ropesrystal Severa Jeremiah, RN  09/22/15 [phone]

## 2015-09-24 ENCOUNTER — Encounter: Admit: 2015-09-24 | Payer: PRIVATE HEALTH INSURANCE | Attending: Internal Medicine | Primary: Internal Medicine

## 2015-09-24 LAB — EKG 12-LEAD
Atrial Rate: 118 {beats}/min
P Axis: 78 degrees
P-R Interval: 190 ms
Q-T Interval: 256 ms
QRS Duration: 82 ms
QTc Calculation (Bazett): 358 ms
R Axis: 90 degrees
T Axis: -44 degrees
Ventricular Rate: 118 {beats}/min

## 2015-09-24 NOTE — Progress Notes (Signed)
Subjective:      Patient ID: Roy Weaver is a 30 y.o. male.    HPI Comments: 09/26/14 Patient presents with:  Seizures: 1 month follow up . One seizure on weekend when he overslept and missed a dose!   Not smoked in a mth ! Chantix helps!      Last seen 08/29/14   Follow-Up from Hospital  Was in ER 4/3 and  Again 4/4 for seizures           Review of Systems   Constitutional: Negative for chills, fatigue and fever.        All vaccinations complete    HENT: Negative.    Eyes: Negative.         Glasses ; Eye ex 2/14    Respiratory: Negative for cough, chest tightness, shortness of breath and wheezing.         Not smoked in a mth!  Smoked  1 ppd ; Not a heavy drinker ; Marijuana occ     No Asthma    Cardiovascular: Negative.         Dad has HTN    Gastrointestinal: Negative.  Negative for blood in stool, constipation and diarrhea.        No FH of ca colon    Endocrine:        Dad has Diabetes    Genitourinary: Negative for dysuria, frequency and urgency.   Musculoskeletal: Negative.    Skin: Negative for rash.   Neurological: Positive for seizures. Negative for dizziness, tremors, weakness, light-headedness and headaches.   Psychiatric/Behavioral: Negative for behavioral problems and sleep disturbance.       Objective:   Physical Exam       Assessment:             Plan:

## 2015-09-27 LAB — CULTURE BLOOD #1: Blood Culture, Routine: NO GROWTH

## 2015-09-27 LAB — CULTURE, BLOOD 2: Culture, Blood 2: NO GROWTH

## 2015-10-03 ENCOUNTER — Ambulatory Visit
Admit: 2015-10-03 | Discharge: 2015-10-03 | Payer: PRIVATE HEALTH INSURANCE | Attending: Internal Medicine | Primary: Internal Medicine

## 2015-10-03 DIAGNOSIS — M792 Neuralgia and neuritis, unspecified: Secondary | ICD-10-CM

## 2015-10-03 MED ORDER — GABAPENTIN 100 MG PO CAPS
100 MG | ORAL_CAPSULE | Freq: Three times a day (TID) | ORAL | 3 refills | Status: DC
Start: 2015-10-03 — End: 2015-10-08

## 2015-10-03 NOTE — Progress Notes (Signed)
Subjective:      Patient ID: Roy Weaver is a 30 y.o. male.    HPI Comments: 10/03/15 Patient presents with:  Follow-Up from Hospital: Unable to get patient weight due to right foot pain  Foot Pain  Numbness: right hand numbness.            Last seen  In office  09/26/14       Has had multiple ER visits b/e seizures mostly due to noncompliance . Also has h/o substance abuse     He suffered injuries in Dec , req surgery in Dec 2016 . Being foll By Wound Centre .Was Seen By Dr Glean Hessajan / Neurology but does not follow up with him as advised    He insists he has numbness in his rt foot + pain . Gabapentin does not help and he wants "strong medication "        Foot Pain   Associated symptoms include arthralgias, myalgias and numbness. Pertinent negatives include no chills, coughing, fatigue, fever, headaches, rash or weakness.       Review of Systems   Constitutional: Negative for chills, fatigue and fever.        All vaccinations complete    HENT: Negative.    Eyes: Negative.         Glasses ; Eye ex 2/14    Respiratory: Negative for cough, chest tightness, shortness of breath and wheezing.         Not smoked in a mth!  Smoked  1 ppd ; Not a heavy drinker ; Marijuana occ     No Asthma    Cardiovascular: Negative.         Dad has HTN    Gastrointestinal: Negative.  Negative for blood in stool, constipation and diarrhea.        No FH of ca colon    Endocrine:        Dad has Diabetes    Genitourinary: Negative for dysuria, frequency and urgency.   Musculoskeletal: Positive for arthralgias, gait problem and myalgias.   Skin: Negative for rash.   Neurological: Positive for seizures, speech difficulty and numbness. Negative for dizziness, tremors, weakness, light-headedness and headaches.   Psychiatric/Behavioral: Positive for behavioral problems. Negative for sleep disturbance.       Objective:   Physical Exam   Constitutional: He is oriented to person, place, and time.   Eyes: Conjunctivae are normal.   Neck: Neck supple.    Cardiovascular: Regular rhythm and normal heart sounds.    Pulmonary/Chest: Breath sounds normal.   Musculoskeletal: Normal range of motion. He exhibits no tenderness or deformity.   Neurological: He is alert and oriented to person, place, and time. He displays no atrophy. No cranial nerve deficit or sensory deficit. He exhibits normal muscle tone. Gait normal.   No appretiable sensory or motor loss   Skin: Skin is warm.   Long surgical incission scar right leg;healing well         Assessment:   Roy Weaver was seen today for follow-up from hospital, foot pain and numbness.    Diagnoses and all orders for this visit:    Neurogenic pain of foot, right  -     gabapentin (NEURONTIN) 100 MG capsule; Take 3 capsules by mouth 3 times daily  -     [place]- Rajan, Raelene BottVijay MD (Inpatient and Outpatient EMG)  -     EMG  Drug-seeking behavior        Seizure disorder (HCC)  Take med reg   - levETIRAcetam (KEPPRA) 1000 MG tablet; Take 1 tablet by mouth 2 times daily                  Plan:      Self Management Goals    Know which medication is for what condition:   Know side effects of medications, and discuss with doctor   Discuss side effects and instructions on new medications. Barriers to medication compliance addressed.  All patient questions answered.  Pt voiced understanding.  Know correct dose/frequency of medications  Take medications at the same time each day  Stay current on medication refills  If taking OTC's check with MD/pharmacy first about interactions    Systolic BP < or equal to 140  Diastolic BP < or equal to 85  Quota system,X  number of cigarettes per day  Nicoderm and or Chantix  Current Flu and Pneumonia Vax  Set targets for weight loss 4 lbs per month  Exercise 3-5 times per week

## 2015-10-08 ENCOUNTER — Encounter

## 2015-10-08 MED ORDER — GABAPENTIN 300 MG PO CAPS
300 | ORAL_CAPSULE | Freq: Three times a day (TID) | ORAL | 1 refills | Status: DC
Start: 2015-10-08 — End: 2016-03-17

## 2015-10-13 ENCOUNTER — Ambulatory Visit
Admit: 2015-10-13 | Discharge: 2015-10-13 | Payer: PRIVATE HEALTH INSURANCE | Attending: Clinical Neurophysiology | Primary: Internal Medicine

## 2015-10-13 DIAGNOSIS — G40219 Localization-related (focal) (partial) symptomatic epilepsy and epileptic syndromes with complex partial seizures, intractable, without status epilepticus: Secondary | ICD-10-CM

## 2015-10-13 MED ORDER — LEVETIRACETAM 500 MG PO TABS
500 | ORAL_TABLET | Freq: Two times a day (BID) | ORAL | 2 refills | Status: DC
Start: 2015-10-13 — End: 2016-01-31

## 2015-10-13 NOTE — Addendum Note (Signed)
Addended by: Salvadore FarberAJAN, Taquilla Downum on: 10/13/2015 10:32 AM     Modules accepted: Orders

## 2015-10-13 NOTE — Patient Instructions (Signed)
Please call with any questions or concerns:   [facility]  @ [phone].    LAB RESULTS:  Please obtain any labs or diagnostic tests as discussed today.   You may call the office to check the results.  Please allow  3 to 7 days for us to get these results.    MEDICATION LIST:  Please bring an accurate list of your medications to every visit.    APPOINTMENT CONFIRMATION:  We will call you the day before your scheduled appointment to confirm.   If we are unable to reach you, you MUST call back by the end of the day to confirm the appointment or we may be forced to cancel.              Stopping Smoking: Care Instructions  Your Care Instructions  Cigarette smokers crave the nicotine in cigarettes. Giving it up is much harder than simply changing a habit. Your body has to stop craving the nicotine. It is hard to quit, but you can do it. There are many tools that people use to quit smoking. You may find that combining tools works best for you.  There are several steps to quitting. First you get ready to quit. Then you get support to help you. After that, you learn new skills and behaviors to become a nonsmoker. For many people, a necessary step is getting and using medicine.  Your doctor will help you set up the plan that best meets your needs. You may want to attend a smoking cessation program to help you quit smoking. When you choose a program, look for one that has proven success. Ask your doctor for ideas. You will greatly increase your chances of success if you take medicine as well as get counseling or join a cessation program.  Some of the changes you feel when you first quit tobacco are uncomfortable. Your body will miss the nicotine at first, and you may feel short-tempered and grumpy. You may have trouble sleeping or concentrating. Medicine can help you deal with these symptoms. You may struggle with changing your smoking habits and rituals. The last step is the tricky one: Be  prepared for the smoking urge to continue for a time. This is a lot to deal with, but keep at it. You will feel better.  Follow-up care is a key part of your treatment and safety. Be sure to make and go to all appointments, and call your doctor if you are having problems. It???s also a good idea to know your test results and keep a list of the medicines you take.  How can you care for yourself at home?  ?? Ask your family, friends, and coworkers for support. You have a better chance of quitting if you have help and support.  ?? Join a support group, such as Nicotine Anonymous, for people who are trying to quit smoking.  ?? Consider signing up for a smoking cessation program, such as the American Lung Association's Freedom from Smoking program.  ?? Set a quit date. Pick your date carefully so that it is not right in the middle of a big deadline or stressful time. Once you quit, do not even take a puff. Get rid of all ashtrays and lighters after your last cigarette. Clean your house and your clothes so that they do not smell of smoke.  ?? Learn how to be a nonsmoker. Think about ways you can avoid those things that make you reach for   a cigarette.  ?? Avoid situations that put you at greatest risk for smoking. For some people, it is hard to have a drink with friends without smoking. For others, they might skip a coffee break with coworkers who smoke.  ?? Change your daily routine. Take a different route to work or eat a meal in a different place.  ?? Cut down on stress. Calm yourself or release tension by doing an activity you enjoy, such as reading a book, taking a hot bath, or gardening.  ?? Talk to your doctor or pharmacist about nicotine replacement therapy, which replaces the nicotine in your body. You still get nicotine but you do not use tobacco. Nicotine replacement products help you slowly reduce the amount of nicotine you need. These products come in several forms, many of them available over-the-counter:  ?? Nicotine  patches  ?? Nicotine gum and lozenges  ?? Nicotine inhaler  ?? Ask your doctor about bupropion (Wellbutrin) or varenicline (Chantix), which are prescription medicines. They do not contain nicotine. They help you by reducing withdrawal symptoms, such as stress and anxiety.  ?? Some people find hypnosis, acupuncture, and massage helpful for ending the smoking habit.  ?? Eat a healthy diet and get regular exercise. Having healthy habits will help your body move past its craving for nicotine.  ?? Be prepared to keep trying. Most people are not successful the first few times they try to quit. Do not get mad at yourself if you smoke again. Make a list of things you learned and think about when you want to try again, such as next week, next month, or next year.  Where can you learn more?  Go to [email or web address] and sign in to your MyChart account. Enter Y522 in the Search Health Information box to learn more about "Stopping Smoking: Care Instructions."     If you do not have an account, please click on the "Sign Up Now" link.  Current as of: Oct 17, 2014  Content Version: 11.2  ?? 2006-2017 [employer]. Care instructions adapted under license by [facility]. If you have questions about a medical condition or this instruction, always ask your healthcare professional. [employer] disclaims any warranty or liability for your use of this information.

## 2015-10-13 NOTE — Progress Notes (Signed)
Roy Weaver   Neurology followup    Subjective:   CC/HP  History was obtained from the patient and his mother  Patient has known partial complex seizures  Patient had a seizure   Patient states that he had missed a dose of medication about 3 months ago and after that had a seizure.  He injured his right leg at that time And needed surgery including a fasciotomy.  He complains of  some numbness in the right leg now  Patient now is back on medication taking Keppra 500 mg twice a day.  Patient is now off the Wellbutrin as well.  No side effects of medication.  No other neurological symptoms    REVIEW OF SYSTEMS    Constitutional:     Chills     Fatigue     Fevers     Malaise     Weight loss      Denies all of the above    Respiratory:     Cough      Shortness of breath          Denies all of the above     Cardiovascular:     Chest pain      Exertional chest pressure/discomfort            Palpitations      Syncope      Denies all of the above        Past Medical History:   Diagnosis Date   . AKI (acute kidney injury) (HCC)    . Hepatitis C 07/23/2015   . Heroin abuse    . Migraine    . Psychiatric problem    . Seizures (HCC)    . Traumatic rhabdomyolysis (HCC)      Family History   Problem Relation Age of Onset   . Diabetes Father      Social History     Social History   . Marital status: Single     Spouse name: N/A   . Number of children: 0   . Years of education: 37     Social History Main Topics   . Smoking status: Current Some Day Smoker     Packs/day: 1.00     Years: 2.00     Types: Cigarettes   . Smokeless tobacco: Former Neurosurgeon     Quit date: 10/04/2013   . Alcohol use 0.0 oz/week     0 Standard drinks or equivalent per week      Comment: occasioanly   . Drug use: Yes     Special: Marijuana, IV      Comment: smokes once every 3 weeks used heroin an hour ago    . Sexual activity: Not Currently     Other Topics Concern   . None     Social History Narrative    ** Merged History  Encounter **             Objective:  Exam:  BP 121/81  Pulse 89  Ht  (1.905 m)  Wt 159 lb (72.1 kg)  BMI 19.87 kg/m2  This is a well-nourished patient in no acute distress  Patient is awake, alert and oriented x3. Speech is normal.  Pupils are equal round reacting to light. Extraocular movements intact. Face symmetrical. Tongue midline.  Motor exam shows normal symmetrical strength. Deep tendon reflexes normal. Plantar reflexes downgoing.  Sensory exam Was difficult to perform in the right leg because of bandages.  Patient states he has numbness in the right leg Coordination normal. Gait Slightly impaired. No carotid bruit. No neck stiffness.        Data :  LABS:  General Labs:    CBC:   Lab Results   Component Value Date    WBC 6.5 09/22/2015    RBC 3.78 09/22/2015    HGB 11.3 09/22/2015    HCT 33.9 09/22/2015    MCV 89.5 09/22/2015    MCH 29.8 09/22/2015    MCHC 33.2 09/22/2015    RDW 13.9 09/22/2015    PLT 275 09/22/2015    MPV 7.8 09/22/2015     BMP:    Lab Results   Component Value Date    NA 146 09/22/2015    K 3.5 09/22/2015    CL 106 09/22/2015    CO2 22 09/22/2015    BUN 6 09/22/2015    LABALBU 4.2 09/22/2015    CREATININE 0.7 09/22/2015    CALCIUM 9.6 09/22/2015    GFRAA >60 09/22/2015    GFRAA 157 08/19/2011    LABGLOM >60 09/22/2015    GLUCOSE 94 09/22/2015     Impression :  Partial complex seizures,  Breakthrough seizure when patient had missed 3 doses of medication  Still has some issues with compliance  Subjective numbness in the right leg,?  Neuropathy    Plan :  Continue Keppra 500 mg twice a day  I will get a Keppra level and see him back in 3 months  If the numbness in his right leg versus, he may need an EMG study  Again stressed the importance of taking medications regularly  Explained driving restrictions  Return in 3months    Please note a portion of  this chart was generated using dragon dictation software. Although every effort was made to ensure the accuracy of this automated  transcription, some errors in transcription may have occurred.

## 2015-10-14 NOTE — Telephone Encounter (Signed)
Spoke with patient referred to Dr Lodema HongSajjad [phone].

## 2015-10-14 NOTE — Telephone Encounter (Signed)
Patient is requesting a referral to pain management right foot pain.

## 2015-10-14 NOTE — Telephone Encounter (Signed)
F/u with Nephrology

## 2015-10-15 NOTE — Telephone Encounter (Signed)
error 

## 2016-01-13 ENCOUNTER — Encounter: Attending: Clinical Neurophysiology | Primary: Internal Medicine

## 2016-01-27 ENCOUNTER — Encounter: Admit: 2016-01-27 | Primary: Internal Medicine

## 2016-01-27 ENCOUNTER — Inpatient Hospital Stay
Admit: 2016-01-27 | Discharge: 2016-01-27 | Disposition: A | Discharge status: Home / Self Care | Attending: Emergency Medicine

## 2016-01-27 ENCOUNTER — Inpatient Hospital Stay
Admit: 2016-01-27 | Discharge: 2016-01-28 | Disposition: A | Discharge status: Home / Self Care | Attending: Emergency Medicine

## 2016-01-27 DIAGNOSIS — Z139 Encounter for screening, unspecified: Secondary | ICD-10-CM

## 2016-01-27 DIAGNOSIS — R569 Unspecified convulsions: Secondary | ICD-10-CM

## 2016-01-27 LAB — BASIC METABOLIC PANEL
Anion Gap: 15 (ref 3–16)
BUN: 12 mg/dL (ref 7–20)
CO2: 22 mmol/L (ref 21–32)
Calcium: 9.5 mg/dL (ref 8.3–10.6)
Chloride: 100 mmol/L (ref 99–110)
Creatinine: 0.8 mg/dL — ABNORMAL LOW (ref 0.9–1.3)
GFR African American: >60 (ref >60)
GFR Non-African American: >60 (ref >60)
Glucose: 87 mg/dL (ref 70–99)
Potassium: 4.1 mmol/L (ref 3.5–5.1)
Sodium: 137 mmol/L (ref 136–145)

## 2016-01-27 LAB — CBC
Hematocrit: 43 % (ref 40.5–52.5)
Hemoglobin: 14.7 g/dL (ref 13.5–17.5)
MCH: 30.5 pg (ref 26.0–34.0)
MCHC: 34.1 g/dL (ref 31.0–36.0)
MCV: 89.5 fL (ref 80.0–100.0)
MPV: 8.2 fL (ref 5.0–10.5)
Platelets: 240 10*3/uL (ref 135–450)
RBC: 4.81 M/uL (ref 4.20–5.90)
RDW: 13.3 % (ref 12.4–15.4)
WBC: 10.4 10*3/uL (ref 4.0–11.0)

## 2016-01-27 LAB — MAGNESIUM: Magnesium: 2.3 mg/dL (ref 1.80–2.40)

## 2016-01-27 MED ORDER — LORAZEPAM 2 MG/ML IJ SOLN
2 MG/ML | Freq: Once | INTRAMUSCULAR | Status: AC
Start: 2016-01-27 — End: 2016-01-27
  Administered 2016-01-27: 21:00:00 2 mg via INTRAVENOUS

## 2016-01-27 MED ORDER — TETANUS-DIPHTH-ACELL PERTUSSIS 5-2.5-18.5 LF-MCG/0.5 IM SUSP
Freq: Once | INTRAMUSCULAR | Status: AC
Start: 2016-01-27 — End: 2016-01-27
  Administered 2016-01-27: 21:00:00 0.5 mL via INTRAMUSCULAR

## 2016-01-27 MED ORDER — LEVETIRACETAM 500 MG/5ML IV SOLN
5005 MG/5ML | Freq: Once | INTRAVENOUS | Status: AC
Start: 2016-01-27 — End: 2016-01-27
  Administered 2016-01-27: 22:00:00 1500 mg via INTRAVENOUS

## 2016-01-27 MED ORDER — LORAZEPAM 2 MG/ML IJ SOLN
2 MG/ML | INTRAMUSCULAR | Status: DC | PRN
Start: 2016-01-27 — End: 2016-01-28

## 2016-01-27 MED FILL — LEVETIRACETAM 500 MG/5ML IV SOLN: 500 MG/5ML | INTRAVENOUS | Qty: 15

## 2016-01-27 MED FILL — BOOSTRIX 5-2.5-18.5 IM SUSP: INTRAMUSCULAR | Qty: 0.5

## 2016-01-27 NOTE — ED Notes (Signed)
Bed: A-16  Expected date: 01/27/16  Expected time:   Means of arrival: [facility]  Comments:  29 yr old/sz/fall/facial trauma     Cleone Slim, RN  01/27/16 [phone]

## 2016-01-27 NOTE — ED Notes (Signed)
cleaned dried blood from right eyebrow area and right side of face. Pt tolerated well.     Vance Gather, RN  01/27/16 2000

## 2016-01-27 NOTE — ED Notes (Signed)
Pt answered to his name he states he feels better      Janann Colonel, RN  01/27/16 [phone]

## 2016-01-27 NOTE — ED Notes (Signed)
Moving all extremities. Arouses to voice.     Vance Gather, RN  01/27/16 2222

## 2016-01-27 NOTE — ED Provider Notes (Addendum)
[facility] EMERGENCY DEPT  eMERGENCY dEPARTMENT eNCOUnter      Pt Name: Roy Weaver  MRN: [id number]  Birthdate 12-31-1985  Date of evaluation: 01/27/2016  Provider: Sheldon Silvan, MD    CHIEF COMPLAINT       Chief Complaint   Patient presents with   ??? Seizures         HISTORY OF PRESENT ILLNESS    Roy Weaver is a 30 y.o. male who presents to the emergency department With seizures. Patient said that he is out of his Keppra, has not taken this for the past several doses. Patient was here earlier today, his mother was concerned that he had a seizure.  Patient says that he woke her up from sleep, denied having a seizure at that time. He came to the emergency department by EMS, declined any workup. He had a normal mental status at that time, able to make decisions, discharged to home. This afternoon at 2 PM, patient says that he fell off the couch and hit his head on the tile floor, cutting above his right eyebrow. His mother was again concerned, Insisted he come back to the emergency department.  Patient adamantly denies that he had a seizure today at all.  Denies any pain in his laceration above his right eye.  Denies any pain in his lips or tongue.    Nursing Notes were reviewed.    REVIEW OF SYSTEMS       Review of Systems    10 point review of systems was performed and was negative except as specifically noted in the HPI.      PAST MEDICAL HISTORY     Past Medical History:   Diagnosis Date   ??? AKI (acute kidney injury) (HCC)    ??? Hepatitis C 07/23/2015   ??? Heroin abuse    ??? Migraine    ??? Psychiatric problem    ??? Seizures (HCC)    ??? Traumatic rhabdomyolysis (HCC)          SURGICAL HISTORY     No past surgical history on file.      CURRENT MEDICATIONS       Discharge Medication List as of 01/28/2016 12:31 AM      CONTINUE these medications which have NOT CHANGED    Details   levETIRAcetam (KEPPRA) 500 MG tablet Take 1 tablet by mouth 2 times daily, Disp-60 tablet, R-2Normal      gabapentin (NEURONTIN) 300 MG  capsule Take 1 capsule by mouth 3 times daily, Disp-90 capsule, R-1Normal      megestrol (MEGACE) 40 MG/ML suspension Take 5 mLs by mouth daily, Disp-240 mL, R-3DC to SNF      docusate sodium (COLACE, DULCOLAX) 100 MG CAPS Take 100 mg by mouth 2 times daily, Disp-20 capsule, R-0Print             ALLERGIES     Review of patient's allergies indicates no known allergies.    FAMILY HISTORY       Family History   Problem Relation Age of Onset   ??? Diabetes Father           SOCIAL HISTORY       Social History     Social History   ??? Marital status: Single     Spouse name: N/A   ??? Number of children: 0   ??? Years of education: 40     Social History Main Topics   ??? Smoking status: Current Some Day Smoker  Packs/day: 1.00     Years: 2.00     Types: Cigarettes   ??? Smokeless tobacco: Former Neurosurgeon     Quit date: 10/04/2013   ??? Alcohol use 0.0 oz/week     0 Standard drinks or equivalent per week      Comment: occasioanly   ??? Drug use: Yes     Special: Marijuana, IV      Comment: smokes once every 3 weeks used heroin an hour ago    ??? Sexual activity: Not Currently     Other Topics Concern   ??? Not on file     Social History Narrative    ** Merged History Encounter **            SCREENINGS    Glasgow Coma Scale  Eye Opening: Spontaneous  Best Verbal Response: Oriented  Best Motor Response: Obeys commands  Glasgow Coma Scale Score: 15        PHYSICAL EXAM    (up to 7 for level 4, 8 or more for level 5)   ED Triage Vitals   BP Temp Temp Source Pulse Resp SpO2 Height Weight   01/27/16 1627 01/27/16 1627 01/27/16 1627 01/27/16 1627 01/27/16 1627 01/27/16 1627 01/27/16 1627 01/27/16 1627   124/77 98.6 ??F (37 ??C) Oral 89 21 94 % 6\' 4"  (1.93 m) 169 lb 8.5 oz (76.9 kg)       Physical Exam  General appearance: Alert, cooperative, no distress, appears stated age.  Head:  Normocephalic, without obvious abnormality, atraumatic.  HEENT: Mucous membranes moist. There are small hemostatic lacerations on the lateral aspects of the tongue.  There are  hematomas of varying ages on the inner lower lip, left-sided lower lip swelling. There is a superficialLaceration with associated swelling above the Right eyebrow.   Neck: Full ROM, trachea midline, no JVD  Lungs: No respiratory distress  Cardiovasular: Perfusing extremities  Abdomen: Nontender, no guarding  Extremities: Atraumatic, full ROM  Skin: No rashes or lesions to exposed skin  Neurologic: Alert and oriented x3, motor grossly normal, clear speech    DIAGNOSTIC RESULTS     RADIOLOGY:   Non-plain film images such as CT, Ultrasound and MRI are read by the radiologist. Plain radiographic images are visualized and preliminarily interpreted by the emergency physician with the below findings:    CXR no acute abnormality    Interpretation per the Radiologist below, if available at the time of this note:    XR Chest Portable   Final Result   Stable negative chest.         CT Head WO Contrast   Final Result   No acute traumatic intracranial abnormality. 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 [place], [place] [address]   Phone [phone]   CBC    Narrative:  Performed at:  [facility]  [address] [place], [place] [address]   Phone [phone]   MAGNESIUM    Narrative:     Performed at:  [facility]  [address] [place], [place] [address]   Phone [phone]       All other labs were within normal range or not returned as of this dictation.    EMERGENCY  DEPARTMENT COURSE and DIFFERENTIAL DIAGNOSIS/MDM:   Vitals:    Vitals:    01/27/16 1850 01/27/16 2120 01/27/16 2150 01/27/16 2320   BP: 121/67 119/76 118/72 119/72   Pulse: 91 97 95 96   Resp: 18 18 19 19    Temp:       TempSrc:       SpO2: 98% 97% 97% 97%   Weight:       Height:           MDM  Patient presents with complaint of possible seizure, although he denies that he has had one.  He notes that he has not taken his Keppra for several days.  Patient is very upset that his mother keep sending him here and insists that he is not having seizures.  I asked to repair the laceration above his right eye and wash it out, patient declines this, Steri-Strips or any other measure.  I informed patient of the risk of infection, scar, he still declines.  My plan was to administer 20 mg/kg of Keppra and observe the patient. Obdtained CT, labs that are unremarkable.     While in the emergency department, patient had a 2-3 minute grand mal seizure.  He was hypoxic in the 70s, quickly returned With 5 L nasal cannula.  We administered 2 mg of lorazepam, which aborted the seizure. Patient unable to make decisions at this time, however because he declined admission before I will observe him until the lorazepam wears off and he is able to make decisions again.    Patient took several hours to become cogent again following the lorazepam.  He remains stable, still fatigued but making decisions appropriately.  He now wants me to fix his laceration above his right eye. I washed with normal saline, Used Dermabond to repair the Laceration.  This was well approximated.  Patient tolerated this well.  We have watched the patient for almost 8 hours and he has not had a repeat seizure, he wants to go home and Is willing to fill his Keppra prescription the morning. I feel that this is reasonable as we were able to Keppra load him here.    REASSESSMENT     ED Course       CONSULTS:  None     PROCEDURES:  Unless otherwise noted below, none      Procedures    FINAL IMPRESSION      1. Seizure [facility])          DISPOSITION/PLAN   DISPOSITION Decision to Discharge    PATIENT REFERRED TO:  Baldwin Jamaica, MD  [address] [place] [address]  [phone]    Schedule an appointment as soon as possible for a visit      [facility]  [address] [place] [address]  [phone]    If symptoms worsen      DISCHARGE MEDICATIONS:  Discharge Medication List as of 01/28/2016 12:31 AM             (  Please note that portions of this note were completed with a voice recognition program.  Efforts were made to edit the dictations but occasionally words are mis-transcribed.)    Sheldon Silvan, MD (electronically signed)  Attending Emergency Physician           Sheldon Silvan, MD  01/28/16 0020       Sheldon Silvan, MD  01/31/16 2317

## 2016-01-27 NOTE — ED Notes (Signed)
Oxygen turned off      Janann Colonel, RN  01/27/16 [phone]

## 2016-01-27 NOTE — ED Notes (Signed)
Dschaak evaluated patient. Patient refused to answer triage instructions.      Elayne SnareNoel Cassandra Harbold, RN  01/27/16 [phone]

## 2016-01-27 NOTE — ED Notes (Signed)
Pt had a seizure that lasted 2.30 mins md made aware meds ordered      Janann Colonel, RN  01/27/16 [phone]

## 2016-01-27 NOTE — ED Notes (Signed)
Able to wake up and ambulate with assistance. Gait unsteady. States old injuries to his right leg. States would like laceration to eyebrow fixed before discharge.     Vance Gather, RN  01/27/16 [phone]

## 2016-01-27 NOTE — Discharge Instructions (Signed)
Make sure to keep taking your prescribed medicines and follow-up with your primary care doctor as needed.  If you have multiple seizures come back to the ER so we can do some blood tests.

## 2016-01-27 NOTE — ED Notes (Signed)
D/C: Order noted for d/c. Pt confirmed d/c paperwork have correct name. Discharge and education instructions reviewed with patient. Teach-back successful.  Pt verbalized understanding and signed d/c papers. Pt denied questions at this time. No acute distress noted. Patient instructed to follow-up as noted - return to emergency department if symptoms worsen. Patient verbalized understanding. Discharged per EDMD with discharge instructions. Pt discharged and ambulated to lobby to call mother. Patient stable upon departure. Thanked patient for choosing [facility] for care.            Elayne Snare, RN  01/27/16 [phone]

## 2016-01-27 NOTE — ED Triage Notes (Signed)
Pt admitted to ED with reported seizures.  Pt states he fell off couch and hit his right eye.  Laceration cleaned.

## 2016-01-27 NOTE — ED Provider Notes (Signed)
CHIEF COMPLAINT  Other      HISTORY OF PRESENT ILLNESS  Roy Weaver is a 30 y.o. male who presents to the ED with no complaints currently.  He has a history of seizures and patient states that he was awoken by his mother this morning and told he was coming to the emergency department because he had a seizure.  He was completely awake and alert during this event and was forced down the stairs by his mother to be evaluated by EMS.  He is brought for evaluation.  He has not been confused or postictal since he has been here and denies having any headache, neck pain, nausea or vomiting, recent illness.  He missed a single dose of his seizure medications a couple of days ago but is not otherwise missed any doses.  He does not want any screening labs and at this time and did not have any urinary incontinence.   No other complaints, modifying factors or associated symptoms.     Nursing notes reviewed.   Past Medical History:   Diagnosis Date   ??? AKI (acute kidney injury) (HCC)    ??? Hepatitis C 07/23/2015   ??? Heroin abuse    ??? Migraine    ??? Psychiatric problem    ??? Seizures (HCC)    ??? Traumatic rhabdomyolysis (HCC)      No past surgical history on file.  Family History   Problem Relation Age of Onset   ??? Diabetes Father      Social History     Social History   ??? Marital status: Single     Spouse name: N/A   ??? Number of children: 0   ??? Years of education: 59     Occupational History   ??? Not on file.     Social History Main Topics   ??? Smoking status: Current Some Day Smoker     Packs/day: 1.00     Years: 2.00     Types: Cigarettes   ??? Smokeless tobacco: Former Neurosurgeon     Quit date: 10/04/2013   ??? Alcohol use 0.0 oz/week     0 Standard drinks or equivalent per week      Comment: occasioanly   ??? Drug use: Yes     Special: Marijuana, IV      Comment: smokes once every 3 weeks used heroin an hour ago    ??? Sexual activity: Not Currently     Other Topics Concern   ??? Not on file     Social History Narrative    ** Merged History  Encounter **          No current facility-administered medications for this encounter.      Current Outpatient Prescriptions   Medication Sig Dispense Refill   ??? levETIRAcetam (KEPPRA) 500 MG tablet Take 1 tablet by mouth 2 times daily 60 tablet 2   ??? gabapentin (NEURONTIN) 300 MG capsule Take 1 capsule by mouth 3 times daily 90 capsule 1   ??? megestrol (MEGACE) 40 MG/ML suspension Take 5 mLs by mouth daily 240 mL 3   ??? docusate sodium (COLACE, DULCOLAX) 100 MG CAPS Take 100 mg by mouth 2 times daily 20 capsule 0     No Known Allergies    REVIEW OF SYSTEMS  6 systems reviewed, pertinent positives per HPI otherwise noted to be negative    PHYSICAL EXAM  BP 135/69   Pulse 92   Temp 98.8 ??F (37.1 ??C) (Temporal)  Resp 16   SpO2 99%  GENERAL APPEARANCE: Awake and alert. Cooperative. No acute distress.  HEAD: Normocephalic. Atraumatic.  EYES: PERRL. EOM's grossly intact.   ENT: Mucous membranes are moist.  No significant signs of biting of the tongue or cheeks on exam.  NECK: Supple. Normal ROM.   CHEST: Equal symmetric chest rise.  LUNGS: Breathing is unlabored. Speaking comfortably in full sentences.   EXTREMITIES: . MAEE. No acute deformities. All extremities neurovascularly intact.  SKIN: Warm and dry.    NEUROLOGICAL: Alert and oriented.  Normal coordination. Gait normal.  Unremarkable exam.        RADIOLOGY  X-RAYS:  I have reviewed radiologic plain film image(s).  ALL OTHER NON-PLAIN FILM IMAGES SUCH AS CT, ULTRASOUND AND MRI HAVE BEEN READ BY THE RADIOLOGIST.  No orders to display              PROCEDURES    ED COURSE/MDM  Patient seen and evaluated.  Patient Has normal vital signs with no complaints whatsoever at this time.  He does not want any medical screening done other than the physical exam and would like to go home.  Given that he has no symptoms and no physical findings of any significant medical pathology I feel he is safe for discharge home.  I discussed results and plan of care with patient and family.   I do feel patient can be safely discharged to home. Recommend follow up with PCP in 2-3 days for re-evaluation. Reasons to RT ED discussed. Patient expresses understanding and is in agreement with plan.     Patient was given scripts for the following medications. I counseled patient how to take these medications.   New Prescriptions    No medications on file           CLINICAL IMPRESSION  1. Encounter for medical screening examination        Blood pressure 135/69, pulse 92, temperature 98.8 ??F (37.1 ??C), temperature source Temporal, resp. rate 16, SpO2 99 %.    DISPOSITION  Patient was discharged to home in good condition.    Disclaimer: All medical record entries made by [employer] dictation.      (Please note that this note was completed with a voice recognition program. Every attempt was made to edit the dictations, but inevitably there remain words that are mis-transcribed.)           Levin Baconyler G Erbie Arment, MD  01/27/16 [phone]

## 2016-01-27 NOTE — ED Triage Notes (Signed)
Patient arrived to ED via [facility]. EMS reports seizure. Patient yells that he did not have a seizure he was sleeping in bed his mom called 911 and there was nothing wrong with him. He walked down he steps without difficulty. Patient reports he does not need any treatment, denies complaints. Patient is alert and oriented x4. Patient ambulated in the hallway without difficulty.

## 2016-01-28 NOTE — ED Notes (Signed)
Wound cleaned and dermabond placed to eyebrow per MD. Pt tolerated well.     Vance GatherWilliam M Arbie Blankley, RN  01/28/16 Jacinta [id number]

## 2016-01-28 NOTE — Discharge Instructions (Signed)
Please follow up with your primary care provider. FILL YOUR PRESCRIPTION FOR KEPPRA IN THE MORNING! You will very likely have another seizure if you do not get your meds tomorrow. Call your primary care provider for a followup appointment.    Your laceration was glued at your visit today, this will fall off on its own. See a doctor if you experience worsening redness, pain, or any other symptom that concerns you around the site.           Seizure: Care Instructions  Your Care Instructions    Seizures are caused by abnormal patterns of electrical signals in the brain. They are different for each person.  Seizures can affect movement, speech, vision, or awareness. Some people have only slight shaking of a hand and do not pass out. Other people may pass out and have violent shaking of the whole body. Some people appear to stare into space. They are awake, but they can't respond normally. Later, they may not remember what happened.  You may need tests to identify the type and cause of the seizures.  A seizure may occur only once, or you may have them more than one time. Taking medicines as directed and following up with your doctor may help keep you from having more seizures.  The doctor has checked you carefully, but problems can develop later. If you notice any problems or new symptoms, get medical treatment right away.  Follow-up care is a key part of your treatment and safety. Be sure to make and go to all appointments, and call your doctor if you are having problems. It's also a good idea to know your test results and keep a list of the medicines you take.  How can you care for yourself at home?   Be safe with medicines. Take your medicines exactly as prescribed. Call your doctor if you think you are having a problem with your medicine.   Do not do any activity that could be dangerous to you or others until your doctor says it is safe to do so. For example, do not drive a car, operate machinery, swim, or climb  ladders.   Be sure that anyone treating you for any health problem knows that you have had a seizure and what medicines you are taking for it.   Identify and avoid things that may make you more likely to have a seizure. These may include lack of sleep, alcohol or drug use, stress, or not eating.   Make sure you go to your follow-up appointment.  When should you call for help?  Call 911 anytime you think you may need emergency care. For example, call if:   You have another seizure.   You have more than one seizure in 24 hours.   You have new symptoms, such as trouble walking, speaking, or thinking clearly.  Call your doctor now or seek immediate medical care if:   You are not acting normally.  Watch closely for changes in your health, and be sure to contact your doctor if you have any problems.  Where can you learn more?  Go to [email or web address] and sign in to your MyChart account. Enter [phone] in the Search Health Information box to learn more about "Seizure: Care Instructions."     If you do not have an account, please click on the "Sign Up Now" link.  Current as of: March 07, 2015  Content Version: 11.2   2006-2017 [employer]. Care instructions adapted under license by  [facility]. If you have questions about a medical condition or this instruction, always ask your healthcare professional. [employer] disclaims any warranty or liability for your use of this information.

## 2016-01-29 LAB — EKG 12-LEAD
Atrial Rate: 92 {beats}/min
Diagnosis: NORMAL
P Axis: 50 degrees
P-R Interval: 178 ms
Q-T Interval: 356 ms
QRS Duration: 88 ms
QTc Calculation (Bazett): 440 ms
R Axis: 82 degrees
T Axis: 45 degrees
Ventricular Rate: 92 {beats}/min

## 2016-01-31 ENCOUNTER — Emergency Department: Admit: 2016-02-01 | Primary: Internal Medicine

## 2016-01-31 ENCOUNTER — Inpatient Hospital Stay
Admit: 2016-01-31 | Discharge: 2016-02-01 | Disposition: A | Discharge status: Home / Self Care | Payer: PRIVATE HEALTH INSURANCE | Attending: Emergency Medicine

## 2016-01-31 DIAGNOSIS — R569 Unspecified convulsions: Secondary | ICD-10-CM

## 2016-01-31 NOTE — ED Provider Notes (Signed)
I independently examined and evaluated Roy Weaver.    In brief, 30 year old male with a known seizure disorder had a seizure at work today.  Patient denies any recent drug use, but states he is out of his Keppra    Focused exam revealed awake, alert, oriented ??3 in no acute distress.  Denies any chest pain or shortness of breath.  Cardiac regular rate and rhythm.  Lungs are clear to auscultation.  He is minimally tender in the lateral left ribs with no guarding and no rebound tenderness.  No crepitance step-offs or deformities are appreciated.    ED course: I will give him a oral Keppra load with 1000 mg by mouth now.  CBC, chem panel, chest x-ray and EKG are pending.  EKG shows normal sinus rhythm at 81 bpm there is left atrial enlargement.  Ventricular hypertrophy, some septal changes.  He does have some ST elevations in V2 ,V3.  No other acute changes.  Computer reads this as an acute MI.  However, I suspect it is probably not as patient is asymptomatic, however, will speak to the cardiologist.  In comparison to an EKG of 8/5./17.  Patient had some elevation in V3 prior.  Have spoken to cardiology.  They feel this is most likely early repolarization and it would not take the patient to Cath Lab or do anything different at this time.  I await labs for further care and final disposition.    All diagnostic, treatment, and disposition decisions were made by myself in conjunction with the advanced practice provider.    For all further details of the patient's emergency department visit, please see the advanced practice provider's documentation.    Comment: Please note this report has been produced using speech recognition software and may contain errors related to that system including errors in grammar, punctuation, and spelling, as well as words and phrases that may be inappropriate. If there are any questions or concerns please feel free to contact the dictating provider for clarification.       Malka So  Lennon Alstrom, MD  01/31/16 [id number]

## 2016-01-31 NOTE — Discharge Instructions (Signed)
Seizure: Care Instructions  Your Care Instructions    Seizures are caused by abnormal patterns of electrical signals in the brain. They are different for each person.  Seizures can affect movement, speech, vision, or awareness. Some people have only slight shaking of a hand and do not pass out. Other people may pass out and have violent shaking of the whole body. Some people appear to stare into space. They are awake, but they can't respond normally. Later, they may not remember what happened.  You may need tests to identify the type and cause of the seizures.  A seizure may occur only once, or you may have them more than one time. Taking medicines as directed and following up with your doctor may help keep you from having more seizures.  The doctor has checked you carefully, but problems can develop later. If you notice any problems or new symptoms, get medical treatment right away.  Follow-up care is a key part of your treatment and safety. Be sure to make and go to all appointments, and call your doctor if you are having problems. It's also a good idea to know your test results and keep a list of the medicines you take.  How can you care for yourself at home?   Be safe with medicines. Take your medicines exactly as prescribed. Call your doctor if you think you are having a problem with your medicine.   Do not do any activity that could be dangerous to you or others until your doctor says it is safe to do so. For example, do not drive a car, operate machinery, swim, or climb ladders.   Be sure that anyone treating you for any health problem knows that you have had a seizure and what medicines you are taking for it.   Identify and avoid things that may make you more likely to have a seizure. These may include lack of sleep, alcohol or drug use, stress, or not eating.   Make sure you go to your follow-up appointment.  When should you call for help?  Call 911 anytime you think you may need emergency care.  For example, call if:   You have another seizure.   You have more than one seizure in 24 hours.   You have new symptoms, such as trouble walking, speaking, or thinking clearly.  Call your doctor now or seek immediate medical care if:   You are not acting normally.  Watch closely for changes in your health, and be sure to contact your doctor if you have any problems.  Where can you learn more?  Go to [email or web address] and sign in to your MyChart account. Enter [id number] in the Search Health Information box to learn more about "Seizure: Care Instructions."     If you do not have an account, please click on the "Sign Up Now" link.  Current as of: March 07, 2015  Content Version: 11.3   2006-2017 [employer]. Care instructions adapted under license by [facility]. If you have questions about a medical condition or this instruction, always ask your healthcare professional. [employer] disclaims any warranty or liability for your use of this information.

## 2016-01-31 NOTE — ED Provider Notes (Signed)
[facility] ED  eMERGENCY dEPARTMENT eNCOUnter        Pt Name: Roy Weaver  MRN: [id number]  Birthdate 04/26/1986  Date of evaluation: 01/31/2016  Provider: Karsten Fells, PA-C  PCP: Baldwin Jamaica, MD  ED Attending: Malka So Lennon Alstrom, MD    CHIEF COMPLAINT       Chief Complaint   Patient presents with   ??? Seizures     Pt to ER, reported seizure at work, not sure if it was witnessed, pt with hx of seizures, takes keppra 500 qd. States he did not hit head       HISTORY OF PRESENT ILLNESS   (Location/Symptom, Timing/Onset, Context/Setting, Quality, Duration, Modifying Factors, Severity)  Note limiting factors.     Roy Weaver is a 30 y.o. male  who presents to the emergency department today for evaluation for a seizure.  Patient states that he had a seizure at work, he did not hit his head.  He feels that his seizure today was similar to his typical seizures.  He states that he has a prescription for Keppra however he states that he has not been taking his Keppra as he ran out of his prescription 2 days ago.  Upon arrival to the ED he is alert and oriented.  He denies a headache.  He denies any chest pain or shortness of breath.  He denies any abdominal pain, nausea, vomiting or diarrhea.  He denies any recent illnesses including cough or fever.  No numbness or tingling    Nursing Notes were all reviewed and agreed with or any disagreements were addressed  in the HPI.    REVIEW OF SYSTEMS    (2-9 systems for level 4, 10 or more for level 5)     Review of Systems   Constitutional: Negative for activity change, appetite change, chills and fever.   HENT: Negative for congestion and rhinorrhea.    Respiratory: Negative for cough and shortness of breath.    Cardiovascular: Negative for chest pain.   Gastrointestinal: Negative for abdominal pain, diarrhea, nausea and vomiting.   Genitourinary: Negative for difficulty urinating, dysuria and hematuria.   Neurological: Positive for seizures.  Negative for weakness and numbness.       Positives and Pertinent negatives as per HPI.  Except as noted above in the ROS, all other systems were reviewed and negative.       PAST MEDICAL HISTORY     Past Medical History:   Diagnosis Date   ??? AKI (acute kidney injury) (HCC)    ??? Hepatitis C 07/23/2015   ??? Heroin abuse    ??? Migraine    ??? Psychiatric problem    ??? Seizures (HCC)    ??? Traumatic rhabdomyolysis (HCC)          SURGICAL HISTORY     History reviewed. No pertinent surgical history.      CURRENT MEDICATIONS       Discharge Medication List as of 01/31/2016  9:49 PM      CONTINUE these medications which have NOT CHANGED    Details   gabapentin (NEURONTIN) 300 MG capsule Take 1 capsule by mouth 3 times daily, Disp-90 capsule, R-1Normal      megestrol (MEGACE) 40 MG/ML suspension Take 5 mLs by mouth daily, Disp-240 mL, R-3DC to SNF      docusate sodium (COLACE, DULCOLAX) 100 MG CAPS Take 100 mg by mouth 2 times daily, Disp-20 capsule, R-0Print  ALLERGIES     Review of patient's allergies indicates no known allergies.    FAMILY HISTORY       Family History   Problem Relation Age of Onset   ??? Diabetes Father           SOCIAL HISTORY       Social History     Social History   ??? Marital status: Single     Spouse name: N/A   ??? Number of children: 0   ??? Years of education: 19     Social History Main Topics   ??? Smoking status: Current Some Day Smoker     Packs/day: 1.00     Years: 2.00     Types: Cigarettes   ??? Smokeless tobacco: Former Neurosurgeon     Quit date: 10/04/2013   ??? Alcohol use 0.0 oz/week     0 Standard drinks or equivalent per week      Comment: occasioanly   ??? Drug use: Yes     Special: Marijuana, IV      Comment: smokes once every 3 weeks used heroin an hour ago    ??? Sexual activity: Not Currently     Other Topics Concern   ??? None     Social History Narrative    ** Merged History Encounter **            SCREENINGS             PHYSICAL EXAM    (up to 7 for level 4, 8 or more for level 5)   ED Triage  Vitals   BP Temp Temp Source Pulse Resp SpO2 Height Weight   01/31/16 2000 01/31/16 2000 01/31/16 2000 01/31/16 2000 01/31/16 2000 01/31/16 2000 -- 01/31/16 2000   129/73 98.7 ??F (37.1 ??C) Oral 81 18 97 %  169 lb (76.7 kg)       Physical Exam   Constitutional: He is oriented to person, place, and time. He appears well-developed and well-nourished.   HENT:   Head: Normocephalic and atraumatic.   Right Ear: External ear normal.   Left Ear: External ear normal.   Nose: Nose normal.   Eyes: Right eye exhibits no discharge. Left eye exhibits no discharge.   Neck: Normal range of motion. Neck supple. No tracheal deviation present.   Cardiovascular: Normal rate, regular rhythm and normal heart sounds.    No murmur heard.  Pulmonary/Chest: Effort normal and breath sounds normal. No respiratory distress. He has no wheezes.   Abdominal: Soft. Bowel sounds are normal. He exhibits no distension. There is no tenderness.   Musculoskeletal: Normal range of motion.   Neurological: He is alert and oriented to person, place, and time.   Skin: Skin is warm and dry. He is not diaphoretic.   Psychiatric: He has a normal mood and affect. His behavior is normal.   Nursing note and vitals reviewed.      DIAGNOSTIC RESULTS   LABS:    Labs Reviewed   CBC WITH AUTO DIFFERENTIAL - Abnormal; Notable for the following:        Result Value    RBC 4.17 (*)     Hemoglobin 12.4 (*)     Hematocrit 36.9 (*)     All other components within normal limits    Narrative:     Performed at:  [facility]  [address],  [place], [place] [address]   Phone [phone]   BASIC METABOLIC PANEL -  Abnormal; Notable for the following:     Glucose 100 (*)     CREATININE 0.7 (*)     All other components within normal limits    Narrative:     Performed at:  [facility]  [address]  [place], [place] [address]   Phone [phone]   LEVETIRACETAM LEVEL - Abnormal; Notable for the following:     Levetiracetam  Lvl <2.0 (*)     All other components within normal limits    Narrative:     Performed at:  [facility]  [address] [place], [place] [address]   Phone [phone]   TROPONIN    Narrative:     Performed at:  [facility]  [address]  [place], [place] [address]   Phone [phone]       All other labs were within normal range or not returned as of this dictation.    EKG: All EKG's are interpreted by the Emergency Department Physician who either signs or Co-signs this chart in the absence of a cardiologist.  Please see their note for interpretation of EKG.      RADIOLOGY:   Non-plain film images such as CT, Ultrasound and MRI are read by the radiologist. Plain radiographic images are visualized and preliminarily interpreted by the  ED Provider with the below findings:        Interpretation per the Radiologist below, if available at the time of this note:    XR Chest Standard TWO VW   Final Result   1.  No acute abnormality.           Xr Chest Standard Two Vw    Result Date: 01/31/2016  EXAMINATION: TWO VIEWS OF THE CHEST 01/31/2016 8:58 pm COMPARISON: 01/27/2016 HISTORY: ORDERING PHYSICIAN PROVIDED HISTORY: Chest Discomfort TECHNOLOGIST PROVIDED HISTORY: Technologist Provided Reason for Exam: Chest Discomfort Acuity: Acute Type of Encounter: Initial FINDINGS: The lungs are clear.  The cardiac silhouette is stable.  There is no pneumothorax or pleural effusion.  No change in the mild to moderate dextroscoliosis.     1.  No acute abnormality.         PROCEDURES   Unless otherwise noted below, none     Procedures    CRITICAL CARE TIME   N/A    CONSULTS:  None      EMERGENCY DEPARTMENT COURSE and DIFFERENTIAL DIAGNOSIS/MDM:   Vitals:    Vitals:    01/31/16 2000   BP: 129/73   Pulse: 81   Resp: 18   Temp: 98.7 ??F (37.1 ??C)   TempSrc: Oral   SpO2: 97%   Weight: 169 lb (76.7 kg)       Patient was given the following medications:  Medications   levETIRAcetam  (KEPPRA) tablet 1,000 mg (1,000 mg Oral Given 01/31/16 2101)       Patient presents to the emergency department today for evaluation for seizures.  Patient states that he has a history of seizure disorder, and he states that he ran out of his Keppra 2 days ago.  He states he does have the means to refill the prescription given to him.  He had a seizure at work which is typical for his seizures.  Upon arrival to the ED he has no complaints.  Physical exam is unremarkable.    EKG has some mild ST segment elevation but no other acute changes.  This  was discussed with the cardiology team by my attending, this does appear to be similar to previous EKGs, patient has no chest pain.  Chest x-ray is unremarkable.  Troponin is negative.  Patient was given 1 g of Keppra in the ED and will be discharged home with a prescription for Keppra.  Patient is to follow-up with his primary care physician within the next 2-3 days reevaluation.  He is to return to the ED for any new or worsening symptoms.  Patient voiced understanding and is agreeable with plan.  Stable for discharge.  Patient was observed in the emergency room for several hours, no seizure was noted.  Keppra level is low.  My suspicion is low at this time for status epilepticus, hypoglycemia, new onset diabetes, diabetic coma, acute renal failure, ACS, pneumonia, full effusion, pneumothorax or other emergent etiology.  Stable for discharge    The patient tolerated their visit well.  They were seen and evaluated by the attending physician, Malka SoGail A. Lennon AlstromBayliss, MD who agreed with the assessment and plan.  The patient and / or the family were informed of the results of any tests, a time was given to answer questions, a plan was proposed and they agreed with plan.        FINAL IMPRESSION      1. Seizure [facility](HCC)          DISPOSITION/PLAN   DISPOSITION Decision to Discharge    PATIENT REFERRED TO:  [name], MD  [address]  [place] [id number]  [phone]    Schedule an  appointment as soon as possible for a visit in 2 days      [facility]  [address]  [place] [id number]  [phone]    As needed, If symptoms worsen      DISCHARGE MEDICATIONS:  Discharge Medication List as of 01/31/2016  9:49 PM          DISCONTINUED MEDICATIONS:  Discharge Medication List as of 01/31/2016  9:49 PM                 (Please note that portions of this note were completed with a voice recognition program.  Efforts were made to edit the dictations but occasionally words are mis-transcribed.)    Karsten FellsEmily Presley Summerlin, PA-C (electronically signed)           Karsten FellsEmily Ardell Aaronson, PA-C  02/01/16 [phone]

## 2016-02-01 LAB — CBC WITH AUTO DIFFERENTIAL
Basophils %: 0.9 %
Basophils Absolute: 0 10*3/uL (ref 0.0–0.2)
Eosinophils %: 3.7 %
Eosinophils Absolute: 0.2 10*3/uL (ref 0.0–0.6)
Hematocrit: 36.9 % — ABNORMAL LOW (ref 40.5–52.5)
Hemoglobin: 12.4 g/dL — ABNORMAL LOW (ref 13.5–17.5)
Lymphocytes %: 32.4 %
Lymphocytes Absolute: 1.4 10*3/uL (ref 1.0–5.1)
MCH: 29.8 pg (ref 26.0–34.0)
MCHC: 33.7 g/dL (ref 31.0–36.0)
MCV: 88.4 fL (ref 80.0–100.0)
MPV: 7.7 fL (ref 5.0–10.5)
Monocytes %: 9.7 %
Monocytes Absolute: 0.4 10*3/uL (ref 0.0–1.3)
Neutrophils %: 53.3 %
Neutrophils Absolute: 2.2 10*3/uL (ref 1.7–7.7)
Platelets: 192 10*3/uL (ref 135–450)
RBC: 4.17 M/uL — ABNORMAL LOW (ref 4.20–5.90)
RDW: 13.3 % (ref 12.4–15.4)
WBC: 4.2 10*3/uL (ref 4.0–11.0)

## 2016-02-01 LAB — BASIC METABOLIC PANEL
Anion Gap: 11 (ref 3–16)
BUN: 7 mg/dL (ref 7–20)
CO2: 26 mmol/L (ref 21–32)
Calcium: 9.1 mg/dL (ref 8.3–10.6)
Chloride: 106 mmol/L (ref 99–110)
Creatinine: 0.7 mg/dL — ABNORMAL LOW (ref 0.9–1.3)
GFR African American: >60 (ref >60)
GFR Non-African American: >60 (ref >60)
Glucose: 100 mg/dL — ABNORMAL HIGH (ref 70–99)
Potassium: 3.5 mmol/L (ref 3.5–5.1)
Sodium: 143 mmol/L (ref 136–145)

## 2016-02-01 LAB — LEVETIRACETAM LEVEL: Levetiracetam Lvl: <2 ug/mL — ABNORMAL LOW (ref 6.0–46.0)

## 2016-02-01 LAB — TROPONIN: Troponin: <0.01 ng/mL (ref <0.01)

## 2016-02-01 MED ORDER — LEVETIRACETAM 500 MG PO TABS
500 MG | ORAL_TABLET | Freq: Two times a day (BID) | ORAL | 0 refills | Status: DC
Start: 2016-02-01 — End: 2016-03-14

## 2016-02-01 MED ORDER — LEVETIRACETAM 500 MG PO TABS
500 MG | Freq: Once | ORAL | Status: AC
Start: 2016-02-01 — End: 2016-01-31
  Administered 2016-02-01: 01:00:00 1000 mg via ORAL

## 2016-02-01 MED FILL — LEVETIRACETAM 500 MG PO TABS: 500 MG | ORAL | Qty: 2

## 2016-02-03 LAB — EKG 12-LEAD
Atrial Rate: 81 {beats}/min
Diagnosis: NORMAL
P Axis: 38 degrees
P-R Interval: 188 ms
Q-T Interval: 384 ms
QRS Duration: 90 ms
QTc Calculation (Bazett): 446 ms
R Axis: 78 degrees
T Axis: 30 degrees
Ventricular Rate: 81 {beats}/min

## 2016-02-11 NOTE — Telephone Encounter (Signed)
[phone]

## 2016-03-14 ENCOUNTER — Inpatient Hospital Stay
Admission: EM | Admit: 2016-03-14 | Discharge: 2016-03-15 | Discharge status: Against Medical Advice | Source: Home / Self Care | Admitting: Internal Medicine

## 2016-03-14 DIAGNOSIS — R569 Unspecified convulsions: Principal | ICD-10-CM

## 2016-03-14 LAB — LEVETIRACETAM LEVEL: Levetiracetam Lvl: 7.2 ug/mL (ref 6.0–46.0)

## 2016-03-14 LAB — CBC WITH AUTO DIFFERENTIAL
Basophils %: 1.2 %
Basophils Absolute: 0 10*3/uL (ref 0.0–0.2)
Eosinophils %: 5.3 %
Eosinophils Absolute: 0.2 10*3/uL (ref 0.0–0.6)
Hematocrit: 40.9 % (ref 40.5–52.5)
Hemoglobin: 14 g/dL (ref 13.5–17.5)
Lymphocytes %: 26.3 %
Lymphocytes Absolute: 0.9 10*3/uL — ABNORMAL LOW (ref 1.0–5.1)
MCH: 30.9 pg (ref 26.0–34.0)
MCHC: 34.3 g/dL (ref 31.0–36.0)
MCV: 90 fL (ref 80.0–100.0)
MPV: 8.1 fL (ref 5.0–10.5)
Monocytes %: 6 %
Monocytes Absolute: 0.2 10*3/uL (ref 0.0–1.3)
Neutrophils %: 61.2 %
Neutrophils Absolute: 2.1 10*3/uL (ref 1.7–7.7)
Platelets: 202 10*3/uL (ref 135–450)
RBC: 4.54 M/uL (ref 4.20–5.90)
RDW: 13 % (ref 12.4–15.4)
WBC: 3.4 10*3/uL — ABNORMAL LOW (ref 4.0–11.0)

## 2016-03-14 LAB — COMPREHENSIVE METABOLIC PANEL
ALT: 6 U/L — ABNORMAL LOW (ref 10–40)
AST: 13 U/L — ABNORMAL LOW (ref 15–37)
Albumin/Globulin Ratio: 1.4 (ref 1.1–2.2)
Albumin: 4.5 g/dL (ref 3.4–5.0)
Alkaline Phosphatase: 82 U/L (ref 40–129)
Anion Gap: 13 (ref 3–16)
BUN: 9 mg/dL (ref 7–20)
CO2: 25 mmol/L (ref 21–32)
Calcium: 9.2 mg/dL (ref 8.3–10.6)
Chloride: 103 mmol/L (ref 99–110)
Creatinine: 0.8 mg/dL — ABNORMAL LOW (ref 0.9–1.3)
GFR African American: >60 (ref >60)
GFR Non-African American: >60 (ref >60)
Globulin: 3.2 g/dL
Glucose: 92 mg/dL (ref 70–99)
Potassium: 3.7 mmol/L (ref 3.5–5.1)
Sodium: 141 mmol/L (ref 136–145)
Total Bilirubin: 0.3 mg/dL (ref 0.0–1.0)
Total Protein: 7.7 g/dL (ref 6.4–8.2)

## 2016-03-14 LAB — CK: Total CK: 277 U/L (ref 39–308)

## 2016-03-14 LAB — URINE DRUG SCREEN
Amphetamine Screen, Urine: NEGATIVE
Barbiturate Screen, Ur: NEGATIVE (ref <200)
Benzodiazepine Screen, Urine: NEGATIVE (ref <200)
Cannabinoid Scrn, Ur: POSITIVE — AB (ref <50)
Cocaine Metabolite Screen, Urine: NEGATIVE (ref <300)
Methadone Screen, Urine: NEGATIVE (ref <300)
Opiate Scrn, Ur: NEGATIVE (ref <300)
Oxycodone Urine: NEGATIVE (ref <100)
PCP Screen, Urine: NEGATIVE (ref <25)
Propoxyphene Scrn, Ur: NEGATIVE (ref <300)
pH, UA: 6

## 2016-03-14 MED ORDER — LEVETIRACETAM 500 MG PO TABS
500 MG | Freq: Once | ORAL | Status: AC
Start: 2016-03-14 — End: 2016-03-14
  Administered 2016-03-14: 13:00:00 500 mg via ORAL

## 2016-03-14 MED ORDER — NORMAL SALINE FLUSH 0.9 % IV SOLN
0.9 % | INTRAVENOUS | Status: DC | PRN
Start: 2016-03-14 — End: 2016-03-15
  Administered 2016-03-14: 20:00:00 10 mL via INTRAVENOUS

## 2016-03-14 MED ORDER — FAMOTIDINE 20 MG PO TABS
20 MG | Freq: Two times a day (BID) | ORAL | Status: DC
Start: 2016-03-14 — End: 2016-03-15

## 2016-03-14 MED ORDER — ENOXAPARIN SODIUM 40 MG/0.4ML SC SOLN
40 MG/0.4ML | Freq: Every evening | SUBCUTANEOUS | Status: DC
Start: 2016-03-14 — End: 2016-03-15

## 2016-03-14 MED ORDER — MAGNESIUM HYDROXIDE 400 MG/5ML PO SUSP
400 MG/5ML | Freq: Every day | ORAL | Status: DC | PRN
Start: 2016-03-14 — End: 2016-03-15

## 2016-03-14 MED ORDER — POTASSIUM CHLORIDE 10 MEQ/100ML IV SOLN
10 MEQ/0ML | INTRAVENOUS | Status: DC | PRN
Start: 2016-03-14 — End: 2016-03-15

## 2016-03-14 MED ORDER — ONDANSETRON HCL 4 MG/2ML IJ SOLN
4 MG/2ML | Freq: Four times a day (QID) | INTRAMUSCULAR | Status: DC | PRN
Start: 2016-03-14 — End: 2016-03-15
  Administered 2016-03-14: 20:00:00 4 mg via INTRAVENOUS

## 2016-03-14 MED ORDER — NORMAL SALINE FLUSH 0.9 % IV SOLN
0.9 % | Freq: Two times a day (BID) | INTRAVENOUS | Status: DC
Start: 2016-03-14 — End: 2016-03-15

## 2016-03-14 MED ORDER — HYDROCODONE-ACETAMINOPHEN 5-325 MG PO TABS
5-325 MG | ORAL | Status: DC | PRN
Start: 2016-03-14 — End: 2016-03-15

## 2016-03-14 MED ORDER — ACETAMINOPHEN 325 MG PO TABS
325 MG | ORAL | Status: DC | PRN
Start: 2016-03-14 — End: 2016-03-15

## 2016-03-14 MED ORDER — SODIUM CHLORIDE 0.9 % IV SOLN
0.9 % | INTRAVENOUS | Status: DC
Start: 2016-03-14 — End: 2016-03-15
  Administered 2016-03-14: 20:00:00 75 via INTRAVENOUS

## 2016-03-14 MED ORDER — DOCUSATE SODIUM 100 MG PO CAPS
100 MG | Freq: Two times a day (BID) | ORAL | Status: DC
Start: 2016-03-14 — End: 2016-03-15

## 2016-03-14 MED ORDER — MEGESTROL ACETATE 40 MG/ML PO SUSP
40 MG/ML | Freq: Every day | ORAL | Status: DC
Start: 2016-03-14 — End: 2016-03-15

## 2016-03-14 MED ORDER — POTASSIUM CHLORIDE 20 MEQ PO PACK
20 MEQ | ORAL | Status: DC | PRN
Start: 2016-03-14 — End: 2016-03-15

## 2016-03-14 MED ORDER — LEVETIRACETAM 500 MG PO TABS
500 MG | ORAL_TABLET | Freq: Two times a day (BID) | ORAL | 0 refills | Status: DC
Start: 2016-03-14 — End: 2016-03-17

## 2016-03-14 MED ORDER — GABAPENTIN 300 MG PO CAPS
300 MG | Freq: Three times a day (TID) | ORAL | Status: DC
Start: 2016-03-14 — End: 2016-03-15

## 2016-03-14 MED ORDER — POTASSIUM CHLORIDE CRYS ER 20 MEQ PO TBCR
20 MEQ | ORAL | Status: DC | PRN
Start: 2016-03-14 — End: 2016-03-15

## 2016-03-14 MED ORDER — LORAZEPAM 2 MG/ML IJ SOLN
2 MG/ML | INTRAMUSCULAR | Status: AC
Start: 2016-03-14 — End: 2016-03-14
  Administered 2016-03-14: 14:00:00 1 via INTRAVENOUS

## 2016-03-14 MED ORDER — LEVETIRACETAM 500 MG PO TABS
500 MG | Freq: Two times a day (BID) | ORAL | Status: DC
Start: 2016-03-14 — End: 2016-03-15

## 2016-03-14 MED ORDER — LORAZEPAM 2 MG/ML IJ SOLN
2 MG/ML | Freq: Once | INTRAMUSCULAR | Status: AC
Start: 2016-03-14 — End: 2016-03-14

## 2016-03-14 MED FILL — ONDANSETRON HCL 4 MG/2ML IJ SOLN: 4 MG/2ML | INTRAMUSCULAR | Qty: 2

## 2016-03-14 MED FILL — LORAZEPAM 2 MG/ML IJ SOLN: 2 MG/ML | INTRAMUSCULAR | Qty: 2

## 2016-03-14 MED FILL — LEVETIRACETAM 500 MG PO TABS: 500 MG | ORAL | Qty: 1

## 2016-03-14 MED FILL — SODIUM CHLORIDE 0.9 % IV SOLN: 0.9 % | INTRAVENOUS | Qty: 1000

## 2016-03-14 NOTE — ED Notes (Signed)
Spoke to [employer], Charity fundraiserN on floor for report. Denies further questions at this time. Pt will be transported with saline lock in place and on cardiac monitor with saline lock in place.      Cam Haiarly J Krissia Schreier, RN  03/14/16 [phone]

## 2016-03-14 NOTE — ED Notes (Signed)
Lab called at this time for Keppra draw.      Cam Haiarly J Aira Sallade, RN  03/14/16 [phone]

## 2016-03-14 NOTE — Progress Notes (Signed)
Patient leaving AMA after signing form. Patient is leaving with his mother with all personal belongings. Refused wheelchair.

## 2016-03-14 NOTE — ED Triage Notes (Addendum)
Pt states had seizure today. States is only here for Keppra rx refill. State still has 4 Keppra left but has not taken today. Reports only being out for 1 day. Resp even and unlabored. A/ox4. No acute distress noted. Denies any need at this time. Call light within reach. Bed in lowest position. Will continue to monitor.

## 2016-03-14 NOTE — ED Notes (Signed)
Pt refuses to be on the cardiac monitor. Pt reports he is agreeable to blood work but states "all I want is a rx and I am good to go." MD made aware. Resp even and unlabored. A/ox4. No acute distress noted. Denies any need at this time. Call light within reach. Bed in lowest position. Will continue to monitor.        Cam Haiarly J Kaiser Belluomini, RN  03/14/16 [phone]

## 2016-03-14 NOTE — ED Notes (Signed)
Pt more alert at this time and responds to new voice and stimuli. Pt updated on POC. Pt verbalizes understanding. Resp even and unlabored. A/ox4. No acute distress noted. Denies any need at this time. Call light within reach. Bed in lowest position. Will continue to monitor.        Cam Haiarly J Klint Lezcano, RN  03/14/16 [phone]

## 2016-03-14 NOTE — Progress Notes (Signed)
Patient alert and oriented, inquiring about leaving the hospital without discharge. Educated patient regarding the need to observe patient over night following two seizures today. Educated patient regarding leaving AMA. Despite education, patient decided to leave AMA. Dr. Ninfa LindenAhmad and Charge Nurse notified.

## 2016-03-14 NOTE — Other (Signed)
Patient Acct Nbr:  [id number]  Primary AUTH/CERT:    Primary Insurance Company Name:   [employer]/HMO  Primary Insurance Plan Name:  [facility] M/CAID HMO  Primary Insurance Group Number:  [id number]  Primary Insurance Plan Type: [employer]  Primary Insurance Policy Number:  [id number]

## 2016-03-14 NOTE — Progress Notes (Signed)
Patient arrived to the unit from ED via stretcher. Patient asleep and mildly arousable to touch. Transferred to bed. Bed alarm on, bed in lowest position, bed rails up and padded x4. Call light in reach.

## 2016-03-14 NOTE — ED Notes (Signed)
VSS. Pt postictal at this time. Resp even and unlabored. No acute distress noted. Denies any need at this time. Call light within reach. Bed in lowest position. Will continue to monitor.        Cam Haiarly J Elif Yonts, RN  03/14/16 [phone]

## 2016-03-14 NOTE — ED Notes (Signed)
Pt has a seizure at this time. No injuries. Pt did not hit side rails. Pt sat up at this time and put on nonrebreather mask. Pt O2 sat was 79%. Increased to 100% after nonrebreather mask.      Cam Haiarly J Leilynn Pilat, RN  03/14/16 [phone]

## 2016-03-14 NOTE — ED Provider Notes (Signed)
[facility] EMERGENCY DEPT  eMERGENCY dEPARTMENT eNCOUnter        Pt Name: Roy Weaver  MRN: [id number]  Birthdate 1985/08/04  Date of evaluation: 03/14/2016  Provider: Sylvie Farrier, MD  PCP: Baldwin Jamaica, MD  ED Attending: Sylvie Farrier, MD    CHIEF COMPLAINT       Chief Complaint   Patient presents with   ??? Seizures   ??? Medication Refill       HISTORY OF PRESENT ILLNESS   (Location/Symptom, Timing/Onset, Context/Setting, Quality, Duration, Modifying Factors, Severity)  Note limiting factors.     [name] is a 30 y.o. male  presents today by EMS after having a seizure. He is unsure exactly what type of seizure he had. He does have a known seizure disorder. He takes Keppra 500 mg twice a day. He notes that he missed his dose this morning. He notes that this is happened several times in the past and he does not want any blood work done. He states that he would like his dose of his seizure medication and another prescription.    Nursing Notes were all reviewed and agreed with or any disagreements were addressed  in the HPI.    REVIEW OF SYSTEMS    (2-9 systems for level 4, 10 or more for level 5)     Review of Systems   Constitutional: Negative for chills, diaphoresis and fever.   HENT: Negative for sore throat and trouble swallowing.    Eyes: Negative for pain and visual disturbance.   Respiratory: Negative for cough, shortness of breath and wheezing.    Cardiovascular: Negative for chest pain, palpitations and leg swelling.   Gastrointestinal: Negative for abdominal pain, constipation, diarrhea, nausea and vomiting.   Endocrine: Negative for polydipsia and polyuria.   Genitourinary: Negative for dysuria, flank pain and hematuria.   Musculoskeletal: Negative for back pain and joint swelling.   Skin: Negative for rash.   Neurological: Positive for seizures. Negative for dizziness, weakness, numbness and headaches.   Hematological: Negative for adenopathy.   Psychiatric/Behavioral: Negative  for confusion and suicidal ideas.   All other systems reviewed and are negative.      Except as noted above in the ROS, all other systems were reviewed and negative.       PAST MEDICAL HISTORY     Past Medical History:   Diagnosis Date   ??? AKI (acute kidney injury) (HCC)    ??? Hepatitis C 07/23/2015   ??? Heroin abuse    ??? Migraine    ??? Psychiatric problem    ??? Seizures (HCC)    ??? Traumatic rhabdomyolysis (HCC)          SURGICAL HISTORY     History reviewed. No pertinent surgical history.      CURRENT MEDICATIONS       Current Discharge Medication List      CONTINUE these medications which have NOT CHANGED    Details   gabapentin (NEURONTIN) 300 MG capsule Take 1 capsule by mouth 3 times daily  Qty: 90 capsule, Refills: 1    Associated Diagnoses: Neurogenic pain of foot, right      megestrol (MEGACE) 40 MG/ML suspension Take 5 mLs by mouth daily  Qty: 240 mL, Refills: 3      docusate sodium (COLACE, DULCOLAX) 100 MG CAPS Take 100 mg by mouth 2 times daily  Qty: 20 capsule, Refills: 0  ALLERGIES     Review of patient's allergies indicates no known allergies.    FAMILY HISTORY       Family History   Problem Relation Age of Onset   ??? Diabetes Father           SOCIAL HISTORY       Social History     Social History   ??? Marital status: Single     Spouse name: N/A   ??? Number of children: 0   ??? Years of education: 8     Social History Main Topics   ??? Smoking status: Current Some Day Smoker     Packs/day: 1.00     Years: 2.00     Types: Cigarettes   ??? Smokeless tobacco: Former Neurosurgeon     Quit date: 10/04/2013   ??? Alcohol use 0.0 oz/week      Comment: occasioanly   ??? Drug use:      Types: Marijuana, IV      Comment: smokes once every 3 weeks used heroin an hour ago    ??? Sexual activity: Not Currently     Other Topics Concern   ??? None     Social History Narrative    ** Merged History Encounter **            SCREENINGS             PHYSICAL EXAM    (up to 7 for level 4, 8 or more for level 5)     ED Triage Vitals  [03/14/16 0822]   BP Temp Temp Source Pulse Resp SpO2 Height Weight   117/82 98.3 ??F (36.8 ??C) Oral 94 16 99 % 6\' 4"  (1.93 m) 170 lb 10.2 oz (77.4 kg)       Physical Exam   Constitutional: He is oriented to person, place, and time. He appears well-developed and well-nourished. No distress.   HENT:   Head: Normocephalic and atraumatic.   Eyes: Conjunctivae and EOM are normal. No scleral icterus.   Neck: Neck supple. No tracheal deviation present.   Cardiovascular: Normal rate, regular rhythm and intact distal pulses.    No murmur heard.  Pulmonary/Chest: Effort normal and breath sounds normal. No respiratory distress. He has no wheezes. He has no rales.   Abdominal: Soft. He exhibits no distension. There is no tenderness. There is no rebound and no guarding.   Musculoskeletal: Normal range of motion. He exhibits no edema.   Lymphadenopathy:     He has no cervical adenopathy.   Neurological: He is alert and oriented to person, place, and time.   5/5 strength in the bilateral upper and lower extremities. sensation intact throughout all extremities. normal finger to nose. Pupils are 4 mm and equally reactive bilaterally. No arm drift. Tongue is midline. No facial droop.   Skin: Skin is warm and dry.   Psychiatric: He has a normal mood and affect. His behavior is normal.   Nursing note and vitals reviewed.      DIAGNOSTIC RESULTS   LABS:    Labs Reviewed   CBC WITH AUTO DIFFERENTIAL - Abnormal; Notable for the following:        Result Value    WBC 3.4 (*)     Lymphocytes # 0.9 (*)     All other components within normal limits    Narrative:     Performed at:  [facility]  [address] [place], [place] [address]  Phone [phone]   COMPREHENSIVE METABOLIC PANEL - Abnormal; Notable for the following:     CREATININE 0.8 (*)     ALT 6 (*)     AST 13 (*)     All other components within normal limits    Narrative:     Performed at:  [facility]  [address]  [place], [place] [id number]   Phone [phone]   URINE DRUG SCREEN - Abnormal; Notable for the following:     Cannabinoid Scrn, Ur POSITIVE (*)     All other components within normal limits    Narrative:     Performed at:  [facility]  [address]  [place], [place] [id number]   Phone [phone]   CK    Narrative:     Performed at:  [facility]  [address]  [place], [place] [id number]   Phone [phone]   LEVETIRACETAM LEVEL    Narrative:     Performed at:  [facility]  [address]  , [place] [id number]   Phone [phone]       All other labs were within normal range or not returned as of this dictation.      EKG:   All EKG's are interpreted by the Emergency Department Physician who either signs or Co-signs this chart in the absence of a cardiologist.  Please see their note for interpretation of EKG.      RADIOLOGY:     ED Physician Imaging Interpretations:      Non-plain film images such as CT, Ultrasound and MRI are read by the radiologist. Plain radiographic images are visualized and preliminarily interpreted by the  ED Provider with the below findings:    Interpretation per the Radiologist below, if available at the time of this note:    No orders to display     No results found.      PROCEDURES   Unless otherwise noted below, none     Procedures    Oxygen Saturation interpretation: 100% on room    CRITICAL CARE TIME       CONSULTS:  IP CONSULT TO NEUROLOGY      EMERGENCY DEPARTMENT COURSE and DIFFERENTIAL DIAGNOSIS/MDM:   Vitals:    Vitals:    03/14/16 0933 03/14/16 0945 03/14/16 1000 03/14/16 1100   BP: 128/77 111/70 119/69 117/70   Pulse: 68 71 75 78   Resp: 14 14 13 14    Temp:       TempSrc:       SpO2: 100% 100% 100% 100%   Weight:       Height:           Patient was given the following medication   Medications   levETIRAcetam (KEPPRA) tablet 500 mg (500 mg Oral Given 03/14/16  0837)   LORazepam (ATIVAN) injection 1 mg (1 mg Intravenous Given 03/14/16 0930)       LOV:[id number]:Recurrent seizure, subtherapeutic antiepileptic, electrolyte abnormality, drug induced seizure, dehydration, other    MDM:    30 yo male with a history of epilepsy who presents today with a reported witnessed seizure. Patient originally stated that he did not want any blood work. At this was ordered. After calling his mother he now is agreeing to laboratory evaluation. Patient is neurologically intact. He is alert and oriented ??4. No focal neurologic deficits. No repeat  seizures here in the emergency department.    9:34 AM  Patient had a repeat seizure in the room. It was generalized tonic-clonic. He was given a milligram of Ativan IM as he did not have a peripheral line secondary to poor vasculature.   I placed a left EJ with good return and flow    9:40 AM  Left EJ is now infiltrated. We were able to place an 22 in the right thumb.      11:53 AM  Spoke with neurology. They recommend admission for multiple seizures. Pt has been given keppra and ativan here. He is awake and talking. aaox4 at this time.   Call placed to hospitalist for admission  uds + for canabinoids.    Plan is for admission to the hospital    The patient tolerated their visit well.  They were seen and evaluated by the attending physician, Sylvie Farrier, MD who agreed with the assessment and plan.  The patient and / or the family were informed of the results of any tests, a time was given to answer questions, a plan was proposed and they agreed with plan.        FINAL IMPRESSION      1. Seizure (HCC)    2. Breakthrough seizure (HCC)          DISPOSITION/PLAN   DISPOSITION Decision to Admit    PATIENT REFERRED TO:  No follow-up provider specified.    DISCHARGE MEDICATIONS:  Current Discharge Medication List          DISCONTINUED MEDICATIONS:  Current Discharge Medication List                 (Please note that portions of this note were completed with a  voice recognition program.  Efforts were made to edit the dictations but occasionally words are mis-transcribed.)    Sylvie Farrier, MD (electronically signed)           Sylvie Farrier, MD  03/14/16 1155

## 2016-03-17 ENCOUNTER — Ambulatory Visit: Admit: 2016-03-17 | Payer: PRIVATE HEALTH INSURANCE | Attending: Internal Medicine | Primary: Internal Medicine

## 2016-03-17 DIAGNOSIS — Z09 Encounter for follow-up examination after completed treatment for conditions other than malignant neoplasm: Secondary | ICD-10-CM

## 2016-03-17 MED ORDER — LEVETIRACETAM 500 MG PO TABS
500 | ORAL_TABLET | Freq: Two times a day (BID) | ORAL | 4 refills | Status: DC
Start: 2016-03-17 — End: 2016-04-23

## 2016-03-17 NOTE — Progress Notes (Signed)
Subjective:      Patient ID: Roy Weaver is a 30 y.o. male.    03/17/16 Patient presents with:  Follow-Up from Hospital    Was seen in ER 03/14/16 for seizure activity . Labs CT Head WNL . Had not been taking keprra as prescribed          Has had multiple ER visits b/e seizures mostly due to noncompliance . Also has h/o substance abuse     He suffered injuries in Dec , req surgery in Dec 2016 .         Review of Systems   Constitutional: Negative for chills, fatigue and fever.        All vaccinations complete growing up  Flu Vac 10/17   HENT: Negative.    Eyes: Negative.         Glasses ; Eye ex 2/14    Respiratory: Negative for cough, chest tightness, shortness of breath and wheezing.           Smoked  1 ppd ;reg  Etoh  ; Marijuana also    No Asthma    Cardiovascular: Negative.         Dad has HTN    Gastrointestinal: Negative.  Negative for blood in stool, constipation and diarrhea.        No FH of ca colon    Endocrine:        Dad has Diabetes    Genitourinary: Negative for dysuria, frequency and urgency.   Skin: Negative for rash.   Neurological: Positive for seizures. Negative for dizziness, tremors, weakness, light-headedness and headaches.   Psychiatric/Behavioral: Positive for behavioral problems. Negative for sleep disturbance.       Objective:   Physical Exam   Constitutional: He is oriented to person, place, and time.   Eyes: Conjunctivae are normal.   Neck: Neck supple.   Cardiovascular: Regular rhythm and normal heart sounds.    Pulmonary/Chest: Breath sounds normal.   Musculoskeletal: Normal range of motion. He exhibits no tenderness or deformity.   Neurological: He is alert and oriented to person, place, and time.   Skin: Skin is warm.   ll     Psychiatric: He is inattentive.       Assessment:   Sylus was seen today for follow-up from hospital.    Diagnoses and all orders for this visit:    Encounter for examination following treatment at hospital  Nl exam     Seizure disorder [facility])  Must take  med reg every dau   -     levETIRAcetam (KEPPRA) 500 MG tablet; Take 1 tablet by mouth 2 times daily    Flu vaccine need    Smoker Risks of smoking reviewed and cessation options discussed  Give the patient information on Smoking Cessation and smoking cessation programs offered in the community. Explain effects smoking and second hand smoke have on the body. Encourage the patient to ask people that smoke around him/her to smoke outside, or in another room.                   Plan:      Self Management Goals    Know which medication is for what condition:   Know side effects of medications, and discuss with doctor   Discuss side effects and instructions on new medications. Barriers to medication compliance addressed.  All patient questions answered.  Pt voiced understanding.  Know correct dose/frequency of medications  Take medications at the  same time each day  Stay current on medication refills  If taking OTC's check with MD/pharmacy first about interactions    Systolic BP < or equal to 140  Diastolic BP < or equal to 85  Quota system,X  number of cigarettes per day  Nicoderm and or Chantix  Current Flu and Pneumonia Vax  Set targets for weight loss 4 lbs per month  Exercise 3-5 times per week

## 2016-04-23 ENCOUNTER — Inpatient Hospital Stay
Admit: 2016-04-23 | Discharge: 2016-04-24 | Disposition: A | Discharge status: Home / Self Care | Payer: PRIVATE HEALTH INSURANCE | Attending: Emergency Medicine

## 2016-04-23 DIAGNOSIS — G40909 Epilepsy, unspecified, not intractable, without status epilepticus: Secondary | ICD-10-CM

## 2016-04-23 LAB — COMPREHENSIVE METABOLIC PANEL
ALT: 7 U/L — ABNORMAL LOW (ref 10–40)
AST: 14 U/L — ABNORMAL LOW (ref 15–37)
Albumin/Globulin Ratio: 1.4 (ref 1.1–2.2)
Albumin: 4.5 g/dL (ref 3.4–5.0)
Alkaline Phosphatase: 80 U/L (ref 40–129)
Anion Gap: 19 — ABNORMAL HIGH (ref 3–16)
BUN: 7 mg/dL (ref 7–20)
CO2: 20 mmol/L — ABNORMAL LOW (ref 21–32)
Calcium: 8.7 mg/dL (ref 8.3–10.6)
Chloride: 101 mmol/L (ref 99–110)
Creatinine: 0.7 mg/dL — ABNORMAL LOW (ref 0.9–1.3)
GFR African American: >60 (ref >60)
GFR Non-African American: >60 (ref >60)
Globulin: 3.3 g/dL
Glucose: 111 mg/dL — ABNORMAL HIGH (ref 70–99)
Potassium: 3.8 mmol/L (ref 3.5–5.1)
Sodium: 140 mmol/L (ref 136–145)
Total Bilirubin: 0.5 mg/dL (ref 0.0–1.0)
Total Protein: 7.8 g/dL (ref 6.4–8.2)

## 2016-04-23 LAB — CBC WITH AUTO DIFFERENTIAL
Basophils %: 0.7 %
Basophils Absolute: 0 10*3/uL (ref 0.0–0.2)
Eosinophils %: 2.1 %
Eosinophils Absolute: 0.1 10*3/uL (ref 0.0–0.6)
Hematocrit: 43 % (ref 40.5–52.5)
Hemoglobin: 14.4 g/dL (ref 13.5–17.5)
Lymphocytes %: 36 %
Lymphocytes Absolute: 1.4 10*3/uL (ref 1.0–5.1)
MCH: 30.8 pg (ref 26.0–34.0)
MCHC: 33.5 g/dL (ref 31.0–36.0)
MCV: 91.9 fL (ref 80.0–100.0)
MPV: 7.8 fL (ref 5.0–10.5)
Monocytes %: 8.7 %
Monocytes Absolute: 0.3 10*3/uL (ref 0.0–1.3)
Neutrophils %: 52.5 %
Neutrophils Absolute: 2.1 10*3/uL (ref 1.7–7.7)
Platelets: 221 10*3/uL (ref 135–450)
RBC: 4.68 M/uL (ref 4.20–5.90)
RDW: 12.9 % (ref 12.4–15.4)
WBC: 4 10*3/uL (ref 4.0–11.0)

## 2016-04-23 LAB — LEVETIRACETAM LEVEL: Levetiracetam Lvl: <2 ug/mL — ABNORMAL LOW (ref 6.0–46.0)

## 2016-04-23 LAB — URINALYSIS WITH REFLEX TO CULTURE
Bilirubin Urine: NEGATIVE
Blood, Urine: NEGATIVE
Glucose, Ur: NEGATIVE mg/dL
Ketones, Urine: NEGATIVE mg/dL
Leukocyte Esterase, Urine: NEGATIVE
Nitrite, Urine: NEGATIVE
Specific Gravity, UA: 1.019 (ref 1.005–1.030)
Urobilinogen, Urine: 0.2 E.U./dL (ref <2.0)
pH, UA: 6.5 (ref 5.0–8.0)

## 2016-04-23 LAB — MICROSCOPIC URINALYSIS
Epithelial Cells, UA: 1 /HPF (ref 0–5)
Hyaline Casts, UA: 6 /LPF (ref 0–8)
RBC, UA: 0 /HPF (ref 0–4)
WBC, UA: 1 /HPF (ref 0–5)

## 2016-04-23 LAB — POCT GLUCOSE: POC Glucose: 126 mg/dl — ABNORMAL HIGH (ref 70–99)

## 2016-04-23 MED ORDER — LEVETIRACETAM 500 MG PO TABS
500 MG | ORAL_TABLET | Freq: Two times a day (BID) | ORAL | 0 refills | Status: DC
Start: 2016-04-23 — End: 2016-08-17

## 2016-04-23 MED ORDER — SODIUM CHLORIDE 0.9 % IV BOLUS
0.9 % | Freq: Once | INTRAVENOUS | Status: AC
Start: 2016-04-23 — End: 2016-04-23
  Administered 2016-04-23: 20:00:00 1000 mL via INTRAVENOUS

## 2016-04-23 MED ORDER — LEVETIRACETAM IN NACL 1000 MG/100ML IV SOLN
1000 MG/100ML | Freq: Once | INTRAVENOUS | Status: AC
Start: 2016-04-23 — End: 2016-04-23
  Administered 2016-04-23: 22:00:00 1000 mg via INTRAVENOUS

## 2016-04-23 MED ORDER — LORAZEPAM 2 MG/ML IJ SOLN
2 MG/ML | INTRAMUSCULAR | Status: DC
Start: 2016-04-23 — End: 2016-04-23

## 2016-04-23 MED ORDER — LORAZEPAM 2 MG/ML IJ SOLN
2 MG/ML | Freq: Once | INTRAMUSCULAR | Status: AC
Start: 2016-04-23 — End: 2016-04-23
  Administered 2016-04-23: 19:00:00 2 mg via INTRAVENOUS

## 2016-04-23 MED FILL — LORAZEPAM 2 MG/ML IJ SOLN: 2 MG/ML | INTRAMUSCULAR | Qty: 1

## 2016-04-23 MED FILL — LEVETIRACETAM IN NACL 1000 MG/100ML IV SOLN: 1000 MG/100ML | INTRAVENOUS | Qty: 100

## 2016-04-23 NOTE — ED Notes (Signed)
Seizure pads placed on stretcher      [name], RN  04/23/16 1410

## 2016-04-23 NOTE — ED Notes (Signed)
Pt sitting in chair on phone calling a ride for pick up. No concerns voiced at this time.      Benson NorwayAlecia Grasiela Jonsson, RN  04/23/16 [phone]

## 2016-04-23 NOTE — ED Notes (Signed)
Pt post ictal. HR 71, 100% on NRB. BP 114/65     [name], RN  04/23/16 [phone]

## 2016-04-23 NOTE — ED Notes (Signed)
Pt sitting up in bed. Attempting to call for at ride. VSS, no distress noted. Will continue to monitor      [name], RN  04/23/16 [phone]

## 2016-04-23 NOTE — ED Notes (Signed)
SaO2 97%. Pt placed on 2L NC     [name], RN  04/23/16 1435

## 2016-04-23 NOTE — ED Notes (Signed)
Pt asleep, no seizure activity noted. VSS, will continue to monitor      [name], RN  04/23/16 [phone]

## 2016-04-23 NOTE — ED Notes (Addendum)
1419: EDPA at bedside. Pt having grand mal sz.   SaO2 75% pt placed on NRB 15L. SaO2 increased to 99%  PIV established.   1422: 2mg  IV ativan given         [name], RN  04/23/16 1438

## 2016-04-23 NOTE — ED Notes (Addendum)
Urine sample sent to lab      Pt wakes up and talks to you but will fall back asleep. No new sz noted. No distress noted. Will continue to montior        [name], RN  04/23/16 [phone]

## 2016-04-23 NOTE — ED Notes (Signed)
Bed: A-16  Expected date:   Expected time:   [facility]  Comments:  32M seizure     [name], [place]RN  04/23/16 [phone]

## 2016-04-23 NOTE — ED Provider Notes (Signed)
I independently evaluated and obtained a history and physical on Roy Weaver.    All diagnostic, treatment, and disposition assistants were made to myself in conjunction the advanced practice provider.    For further details of this patient's emergency department encounter, please see the advanced practice provider's documentation.    History: 30 year old male with known history of seizures on 500 mg Keppra twice a day with stated complaints arise for evaluation status post seizure and possible syncope.    Physician Exam:   General Appearance:  Postictal but  cooperative, no distress, appears stated age.   Head:  Normocephalic, without obvious abnormality, atraumatic.   Eyes:  conjunctiva/corneas clear, EOM's intact.  Sclera anicteric.   ENT: Mucous membranes moist.   Neck: Supple, symmetrical, trachea midline, no adenopathy.  No jugular venous distention.     Lungs:   No Respiratory Distress.   Chest Wall:     Heart:  Tachycardic rate, regular rhythm    Abdomen:   SOFT, NTND   Extremities:  Full range of motion.   Pulses: Intact   Skin:  No rashes or lesions to exposed skin.   Neurologic: Alert and oriented, Postictal.   Motor grossly normal.  Speech clear.     MDM: EKG interpreted by me shows sinus tachycardia with rate 103 bpm with no ST segment elevation      Labs shows no emergent process but keppra level low, will load w 1g IV after d/w Neuro and DC w education on compliance and neuro f/u  Patient told not to operate heavy machinery, swim, climb heights, operate vehicles until cleared by his neurologist in the context of seizures.    Impression: Seizure, subtherapeutic keppra level    (Please note that portions of this note may have been completed with a voice recognition program. Efforts were made to edit the dictations but occasionally words are mis-transcribed.)       Dewaine Congeryan Genis Shakina Choy, MD  04/26/16 1455

## 2016-04-23 NOTE — ED Triage Notes (Signed)
Pt squad- pt mother states she witness him having 2 seizure. Pt has history of sz, states he is taking his medication. Pt states he needs to pick up is refill.  Pt is A&O x4 upon arrival to ED, pt moving all extremities . Pt denies having a sz, states he just "fell out"  BS 111 per squad

## 2016-04-23 NOTE — ED Provider Notes (Signed)
[facility] EMERGENCY DEPT  eMERGENCY dEPARTMENT eNCOUnter        Pt Name: Roy Weaver  MRN: [id number]  Birthdate 08/16/1985  Date of evaluation: 04/23/2016  Provider: Burman Freestone, PA  PCP: Baldwin Jamaica, MD  ED Attending: Dr. Charise Carwin    CHIEF COMPLAINT       Chief Complaint   Patient presents with   ??? Seizures       HISTORY OF PRESENT ILLNESS   (Location/Symptom, Timing/Onset, Context/Setting, Quality, Duration, Modifying Factors, Severity)  Note limiting factors.     Roy Weaver is a 30 y.o. male who presents to the emergency department today with seizures.  He presents via EMS.  He states that happened today around 1:00.  He states he does not believe that he had a seizure, he believes that he passed out.  He states that he knows he passed out, but he attributes to not eating anything since last night.  He states he did not fall to the ground or hit his head, that his mother caught him.  He reports that the seizure happened at 1:00, and his mom called the ambulance around 1:50.  He states that he is upset because this is the third time that has happened, and he did not wake up confused or disoriented which she normally does.  He states that he does not believe he had a seizure.  He has been compliant in taking his Keppra.  He denies any chest pain, shortness of breath, fevers or chills.  There are no further complaints at this time.     Nursing Notes were all reviewed and agreed with or any disagreements were addressed  in the HPI.    REVIEW OF SYSTEMS    (2-9 systems for level 4, 10 or more for level 5)     Review of Systems   Constitutional: Negative for chills and fever.   Eyes: Negative for visual disturbance.   Respiratory: Negative for cough and shortness of breath.    Cardiovascular: Negative for chest pain and palpitations.   Gastrointestinal: Negative for nausea and vomiting.   Neurological: Positive for syncope (pt thinks due to not eating; denies falling to ground or hitting head,  mother caught him). Negative for dizziness, seizures (pt denies, but has a h/o seizure disorder) and headaches.       Positives and Pertinent negatives as per HPI.  Except as noted above in the ROS, all other systems were reviewed and negative.       PAST MEDICAL HISTORY     Past Medical History:   Diagnosis Date   ??? AKI (acute kidney injury) (HCC)    ??? Hepatitis C 07/23/2015   ??? Heroin abuse    ??? Migraine    ??? Psychiatric problem    ??? Seizures (HCC)    ??? Traumatic rhabdomyolysis (HCC)          SURGICAL HISTORY     History reviewed. No pertinent surgical history.      CURRENT MEDICATIONS       Discharge Medication List as of 04/23/2016  6:07 PM            ALLERGIES     Review of patient's allergies indicates no known allergies.    FAMILY HISTORY       Family History   Problem Relation Age of Onset   ??? Diabetes Father           SOCIAL HISTORY  Social History     Social History   ??? Marital status: Single     Spouse name: N/A   ??? Number of children: 0   ??? Years of education: 1114     Social History Main Topics   ??? Smoking status: Current Some Day Smoker     Packs/day: 1.00     Years: 2.00     Types: Cigarettes   ??? Smokeless tobacco: Former NeurosurgeonUser     Quit date: 10/04/2013   ??? Alcohol use 0.0 oz/week      Comment: occasioanly   ??? Drug use:      Types: Marijuana, IV      Comment: past heorin IV.   ??? Sexual activity: Not Currently     Other Topics Concern   ??? None     Social History Narrative    ** Merged History Encounter **            SCREENINGS    Glasgow Coma Scale  Eye Opening: Spontaneous  Best Verbal Response: Oriented  Best Motor Response: Obeys commands  Glasgow Coma Scale Score: 15        PHYSICAL EXAM    (up to 7 for level 4, 8 or more for level 5)     ED Triage Vitals [04/23/16 1408]   BP Temp Temp Source Pulse Resp SpO2 Height Weight   (!) 144/89 98.8 ??F (37.1 ??C) Oral 93 16 99 % 6\' 4"  (1.93 m) 173 lb 1 oz (78.5 kg)       Physical Exam   Constitutional: He is oriented to person, place, and time. He appears  well-developed and well-nourished. He is cooperative.  Non-toxic appearance. He does not appear ill. No distress.   HENT:   Head: Normocephalic and atraumatic.   Eyes: Conjunctivae are normal. Pupils are equal, round, and reactive to light.   Cardiovascular: Normal rate, regular rhythm and normal heart sounds.    Pulmonary/Chest: Effort normal and breath sounds normal. No respiratory distress. He has no wheezes.   Abdominal: Soft. Bowel sounds are normal. He exhibits no distension. There is no tenderness.   Neurological: He is alert and oriented to person, place, and time. He has normal reflexes. No cranial nerve deficit. He exhibits normal muscle tone.   With witnessed seizure in ED.    Skin: Skin is warm and dry. He is not diaphoretic.   Psychiatric: He has a normal mood and affect. His behavior is normal. Thought content normal.   Nursing note and vitals reviewed.      DIAGNOSTIC RESULTS   LABS:    Labs Reviewed   COMPREHENSIVE METABOLIC PANEL - Abnormal; Notable for the following:        Result Value    CO2 20 (*)     Anion Gap 19 (*)     Glucose 111 (*)     CREATININE 0.7 (*)     ALT 7 (*)     AST 14 (*)     All other components within normal limits    Narrative:     Performed at:  [facility]  [address]  [place], [place] [address]   Phone [phone]   LEVETIRACETAM LEVEL - Abnormal; Notable for the following:     Levetiracetam Lvl <2.0 (*)     All other components within normal limits    Narrative:     Performed at:  [facility]  [address]  [place], [place] [address]   Phone [phone]   URINE RT REFLEX TO CULTURE - Abnormal; Notable for the following:     Protein, UA TRACE (*)     All other components within normal limits    Narrative:     Performed at:  [facility]  [address] [place], [place] [address]   Phone [phone]   POCT GLUCOSE - Abnormal; Notable for the following:     POC  Glucose 126 (*)     All other components within normal limits    Narrative:     Performed at:  [facility]  [address] [place], [place] [address]   Phone [phone]   CBC WITH AUTO DIFFERENTIAL    Narrative:     Performed at:  [facility]  [address] [place], [place] [address]   Phone [phone]   MICROSCOPIC URINALYSIS    Narrative:     Performed at:  [facility]  [address] [place], [place] [address]   Phone [phone]   POCT GLUCOSE       All other labs were within normal range or not returned as of this dictation.    EKG: All EKG's are interpreted by the Emergency Department Physician who either signs or Co-signs this chart in the absence of a cardiologist.  Please see their note for interpretation of EKG.      RADIOLOGY:   Non-plain film images such as CT, Ultrasound and MRI are read by the radiologist. Plain radiographic images are visualized and preliminarily interpreted by the  ED Provider with the below findings:    Interpretation per the Radiologist below, if available at the time of this note:    No orders to display     No results found.      PROCEDURES   Unless otherwise noted below, none     Procedures    CRITICAL CARE TIME   N/A    CONSULTS:  IP CONSULT TO NEUROLOGY      EMERGENCY DEPARTMENT COURSE and DIFFERENTIAL DIAGNOSIS/MDM:   Vitals:    Vitals:    04/23/16 1838 04/23/16 1853 04/23/16 1901 04/23/16 1954   BP: 123/85 134/88  132/88   Pulse: 94 121 96 89   Resp: 26 21 19 18    Temp:    98.8 ??F (37.1 ??C)   TempSrc:    Oral   SpO2: 95% 97% 96% 99%   Weight:       Height:           Patient was given the following medications:  Medications   LORazepam (ATIVAN) injection 2 mg (2 mg Intravenous Given 04/23/16 1422)   0.9 % sodium chloride bolus (0 mLs Intravenous Stopped 04/23/16 1744)   levetiracetam (KEPPRA) 1000 mg/100 mL IVPB (0 mg Intravenous Stopped 04/23/16 1744)       The patient  presented to the ED with HPI as noted above. The patient remained stable in the ED. The patient does have a known seizure disorder, states that he has been compliant taking his medications.  He adamantly denies that he had a seizure prior to arrival today, as he states he does remember the whole event and immediately following, however during the course of my interview and examine the patient did have a seizure.  Laboratory analysis reveals No leukocytosis or  concerning electrolyte abnormalities, his Keppra level is less than 2, indicating that he has not been taking his Keppra as he states he has been.  His urine shows No evidence of infection.  The pharmacist in the ED was able to look back at the patient's prescription, shows that the patient last filled his Keppra in October, and he should have run out of medication about a week ago.  I discussed the case with the neurologist on call, Dr. Lady SaucierMaddox, who recommended that we give the patient a gram of Keppra in the ED to load him, and that the patient should follow-up in the office with the next available neurologist appointment.  Based on the history, physical exam, lab findings, I believe this patient has had a seizure, consistent with his seizure disorder, and is safe to be discharged home, as vitals are stable and the patient is nontoxic in appearance. In the emergency department, they were treated with ativan 2mg  IV, which did improve their symptoms.  He was also given 1 g dose of Keppra. They will be discharged home with a prescription for Keppra to be taken twice a day as he was previously prescribed, as he has likely run out of his last prescription.  He is instructed to follow up with neurology as soon as possible.  The patient was given strict return precautions and instructed to return to the ED with any new or worrisome symptoms. The patient verbalized understanding and agreed with the plan, and was discharged home in stable condition.     The patient  tolerated their visit well.  They were seen and evaluated by the attending physician, Dr. Charise Carwinyan Mazin who agreed with the assessment and plan.  The patient and / or the family were informed of the results of any tests, a time was given to answer questions, a plan was proposed and they agreed with plan.        FINAL IMPRESSION      1. Seizure disorder [facility](HCC)          DISPOSITION/PLAN   DISPOSITION     PATIENT REFERRED TO:  [name], MD  [address]  [place] [id number]  [phone]    Schedule an appointment as soon as possible for a visit       *[facility]  [address]  [place] [id number]  [phone]    Schedule an appointment as soon as possible for a visit   with first available neurologist    [facility]  [address]  [place] [id number]  [phone]    If symptoms worsen      DISCHARGE MEDICATIONS:  Discharge Medication List as of 04/23/2016  6:07 PM      START taking these medications    Details   levETIRAcetam (KEPPRA) 500 MG tablet Take 1 tablet by mouth 2 times daily, Disp-60 tablet, R-0Print             DISCONTINUED MEDICATIONS:  Discharge Medication List as of 04/23/2016  6:07 PM                 (Please note that portions of this note were completed with a voice recognition program.  Efforts were made to edit the dictations but occasionally words are mis-transcribed.)    Burman FreestoneBrook M Latunya Kissick, PA (electronically signed)            Burman FreestoneBrook M Hemi Chacko, [place]  04/23/16 2311

## 2016-04-23 NOTE — ED Notes (Signed)
Pt asleep, no seizure activity noted. VSS, will continue to monitor      [name], RN  04/23/16 [phone]

## 2016-04-23 NOTE — ED Notes (Signed)
Bedside report received from [employer] Mutual[employer] Kelly.      Benson NorwayAlecia Tayte Childers, RN  04/23/16 [phone]

## 2016-04-23 NOTE — ED Notes (Signed)
Pt waking and talking. VSS, no distress noted.   EDPA at bedside talking with patient.      [name], RN  04/23/16 [phone]

## 2016-04-24 LAB — EKG 12-LEAD
Atrial Rate: 103 {beats}/min
P Axis: 58 degrees
P-R Interval: 176 ms
Q-T Interval: 340 ms
QRS Duration: 78 ms
QTc Calculation (Bazett): 445 ms
R Axis: 89 degrees
T Axis: 56 degrees
Ventricular Rate: 103 {beats}/min

## 2016-06-03 ENCOUNTER — Inpatient Hospital Stay
Admit: 2016-06-03 | Discharge: 2016-06-03 | Disposition: A | Discharge status: Home / Self Care | Attending: Emergency Medicine

## 2016-06-03 DIAGNOSIS — G40909 Epilepsy, unspecified, not intractable, without status epilepticus: Secondary | ICD-10-CM

## 2016-06-03 LAB — BASIC METABOLIC PANEL
Anion Gap: 12 (ref 3–16)
BUN: 12 mg/dL (ref 7–20)
CO2: 26 mmol/L (ref 21–32)
Calcium: 9.2 mg/dL (ref 8.3–10.6)
Chloride: 103 mmol/L (ref 99–110)
Creatinine: 0.8 mg/dL — ABNORMAL LOW (ref 0.9–1.3)
GFR African American: >60 (ref >60)
GFR Non-African American: >60 (ref >60)
Glucose: 118 mg/dL — ABNORMAL HIGH (ref 70–99)
Potassium: 3.9 mmol/L (ref 3.5–5.1)
Sodium: 141 mmol/L (ref 136–145)

## 2016-06-03 LAB — CBC WITH AUTO DIFFERENTIAL
Basophils %: 1.2 %
Basophils Absolute: 0.1 10*3/uL (ref 0.0–0.2)
Eosinophils %: 1.2 %
Eosinophils Absolute: 0.1 10*3/uL (ref 0.0–0.6)
Hematocrit: 43.3 % (ref 40.5–52.5)
Hemoglobin: 14.5 g/dL (ref 13.5–17.5)
Lymphocytes %: 25.2 %
Lymphocytes Absolute: 1.1 10*3/uL (ref 1.0–5.1)
MCH: 30.8 pg (ref 26.0–34.0)
MCHC: 33.6 g/dL (ref 31.0–36.0)
MCV: 91.8 fL (ref 80.0–100.0)
MPV: 8 fL (ref 5.0–10.5)
Monocytes %: 8 %
Monocytes Absolute: 0.4 10*3/uL (ref 0.0–1.3)
Neutrophils %: 64.4 %
Neutrophils Absolute: 2.9 10*3/uL (ref 1.7–7.7)
Platelets: 226 10*3/uL (ref 135–450)
RBC: 4.71 M/uL (ref 4.20–5.90)
RDW: 13.3 % (ref 12.4–15.4)
WBC: 4.4 10*3/uL (ref 4.0–11.0)

## 2016-06-03 LAB — POCT GLUCOSE
Glucose: 122 mg/dL
POC Glucose: 122 mg/dl — ABNORMAL HIGH (ref 70–99)

## 2016-06-03 LAB — LEVETIRACETAM LEVEL: Levetiracetam Lvl: <2 ug/mL — ABNORMAL LOW (ref 6.0–46.0)

## 2016-06-03 LAB — SPECIMEN REJECTION

## 2016-06-03 MED ORDER — LEVETIRACETAM 500 MG PO TABS
500 MG | ORAL_TABLET | Freq: Two times a day (BID) | ORAL | 0 refills | Status: DC
Start: 2016-06-03 — End: 2016-08-17

## 2016-06-03 MED ADMIN — levetiracetam (KEPPRA) 1000 mg/100 mL IVPB: 1000 mg | INTRAVENOUS | @ 21:00:00 | NDC 55150024747

## 2016-06-03 MED FILL — LEVETIRACETAM IN NACL 1000 MG/100ML IV SOLN: 1000 MG/100ML | INTRAVENOUS | Qty: 100

## 2016-06-03 NOTE — ED Triage Notes (Addendum)
Squad called for unconscious patient. Pt was post-ictal on arrival for approx . BS 139 per squad   Upon arrival to ED pt A&O x4, has hx of seizures. Pt did not take medication today

## 2016-06-03 NOTE — ED Notes (Signed)
Pt sitting up in bed on phone. Alert and oriented x 3. No complaints of pain or discomfort. NAD. VSS. Respirations easy. Skin warm and dry. Call light within reach. Will continue to monitor. More blood sent to lab for keppra level. Awaiting level before giving keppra IV per Dr Micheline ChapmanMazin       Greg Eckrich, RN  06/03/16 [phone]

## 2016-06-03 NOTE — ED Notes (Signed)
Pt to be discharged to home. Pt alert and oriented x 3. No complaints. NAD. VSS. Respirations easy. Skin warm and dry. PIV removed. Discharge instructions and prescription x 1 given. Pt verbalizes understanding. Pt ambulates. Gait steady. Pt discharged to home by self - to bus stop.      Shell PointHolly Schon Zeiders, [place]RN  06/03/16 [phone]

## 2016-06-03 NOTE — ED Notes (Signed)
Pt resting quietly in bed with no complaints. Alert and oriented x 3. No complaints. NAD. VSS. Respirations easy. Skin warm and dry. Call light within reach. Will continue to monitor.      [place] BarHolly Torien Ramroop, [place]RN  06/03/16 [phone]

## 2016-06-03 NOTE — ED Notes (Signed)
Pt resting quietly in bed with no complaints. NAD. VSS. Respirations easy. Skin warm and dry. Alert and oriented x 3. Call light within reach. Will continue to monitor.      VarnvilleHolly Denym Rahimi, [place]RN  06/03/16 [phone]

## 2016-06-03 NOTE — ED Notes (Signed)
Pt to ED s/p seizure. Pt alert and oriented x 3 upon arrival to ED. No complaints of pain or discomfort. States had seizure yesterday and [facility] - pt states was given Keppra there. States was on way to get prescription today when had another seizure. NAD. VSS. Respirations easy. Skin warm and dry. Call light within reach. Will continue to monitor.      LaskerHolly Tionna Gigante, [place]RN  06/03/16 [phone]

## 2016-06-03 NOTE — ED Provider Notes (Signed)
Triage Chief Complaint:   Seizures    HOPI:  Roy Weaver is a 31 y.o. male that presents for witnessed seizure at home.  Past medical history of seizure disorder and is supposed be on Keppra 500 twice a day he states he has not been compliant recently.  [facility] in the context of noncompliance and given oral Keppra and discharged home with a prescription for Keppra however he has not yet filled.  He arrives stating a witnessed seizure.  He arrives [facility] 15 with complaints of no pain headache or any issues other than stating that he aggressively had not filled his Keppra prescription yet..    ROS:  At least 9 systems reviewed and otherwise acutely negative except as in the HOPI.    Past Medical History:   Diagnosis Date   . AKI (acute kidney injury) (HCC)    . Hepatitis C 07/23/2015   . Heroin abuse    . Migraine    . Psychiatric problem    . Seizures (HCC)    . Traumatic rhabdomyolysis (HCC)      History reviewed. No pertinent surgical history.  Family History   Problem Relation Age of Onset   . Diabetes Father      Social History     Social History   . Marital status: Single     Spouse name: N/A   . Number of children: 0   . Years of education: 14     Occupational History   . Not on file.     Social History Main Topics   . Smoking status: Current Some Day Smoker     Packs/day: 1.00     Years: 2.00     Types: Cigarettes   . Smokeless tobacco: Former Neurosurgeon     Quit date: 10/04/2013   . Alcohol use 0.0 oz/week      Comment: occasioanly   . Drug use: Yes     Types: Marijuana, IV      Comment: past heorin IV.   Marland Kitchen Sexual activity: Not Currently     Other Topics Concern   . Not on file     Social History Narrative    ** Merged History Encounter **          No current facility-administered medications for this encounter.      Current Outpatient Prescriptions   Medication Sig Dispense Refill   . levETIRAcetam (KEPPRA) 500 MG tablet Take 1 tablet by mouth 2 times daily for 14 days 28 tablet 0   .  levETIRAcetam (KEPPRA) 500 MG tablet Take 1 tablet by mouth 2 times daily 60 tablet 0   . levETIRAcetam (KEPPRA) 500 MG tablet Take 500 mg by mouth 2 times daily       No Known Allergies    Nursing Notes Reviewed    Physical Exam:  ED Triage Vitals [06/03/16 1338]   Enc Vitals Group      BP 127/89      Pulse 95      Resp 16      Temp 98.7 F (37.1 C)      Temp Source Oral      SpO2       Weight 164 lb 14.5 oz (74.8 kg)      Height 6\' 4"  (1.93 m)      Head Circumference       Peak Flow       Pain Score  Pain Loc       Pain Edu?       Excl. in GC?      GENERAL APPEARANCE: Awake and alert. Cooperative. No acute distress.   HEAD: Normocephalic. Atraumatic.  EYES: EOM's grossly intact. Sclera anicteric.  ENT: Mucous membranes are moist. Tolerates saliva. No trismus.  NECK: Supple. No meningismus. Trachea midline.  HEART: RRR. Radial pulses 2+.  LUNGS: Respirations unlabored. CTAB  ABDOMEN: Soft. Non-tender. No guarding or rebound.  EXTREMITIES: No acute deformities.  SKIN: Warm and dry.  NEUROLOGICAL: No gross facial drooping. Moves all 4 extremities spontaneously.  PSYCHIATRIC: Normal mood.    I have reviewed and interpreted all of the currently available lab results from this visit (if applicable):  Results for orders placed or performed during the hospital encounter of 06/03/16   CBC Auto Differential   Result Value Ref Range    WBC 4.4 4.0 - 11.0 K/uL    RBC 4.71 4.20 - 5.90 M/uL    Hemoglobin 14.5 13.5 - 17.5 g/dL    Hematocrit 84.6 96.2 - 52.5 %    MCV 91.8 80.0 - 100.0 fL    MCH 30.8 26.0 - 34.0 pg    MCHC 33.6 31.0 - 36.0 g/dL    RDW 95.2 84.1 - 32.4 %    Platelets 226 135 - 450 K/uL    MPV 8.0 5.0 - 10.5 fL    Neutrophils % 64.4 %    Lymphocytes % 25.2 %    Monocytes % 8.0 %    Eosinophils % 1.2 %    Basophils % 1.2 %    Neutrophils # 2.9 1.7 - 7.7 K/uL    Lymphocytes # 1.1 1.0 - 5.1 K/uL    Monocytes # 0.4 0.0 - 1.3 K/uL    Eosinophils # 0.1 0.0 - 0.6 K/uL    Basophils # 0.1 0.0 - 0.2 K/uL   Basic  Metabolic Panel   Result Value Ref Range    Sodium 141 136 - 145 mmol/L    Potassium 3.9 3.5 - 5.1 mmol/L    Chloride 103 99 - 110 mmol/L    CO2 26 21 - 32 mmol/L    Anion Gap 12 3 - 16    Glucose 118 (H) 70 - 99 mg/dL    BUN 12 7 - 20 mg/dL    CREATININE 0.8 (L) 0.9 - 1.3 mg/dL    GFR Non-African American >60 >60    GFR African American >60 >60    Calcium 9.2 8.3 - 10.6 mg/dL   SPECIMEN REJECTION   Result Value Ref Range    Rejected Test keprt     Reason for Rejection see below    Levetiracetam Level   Result Value Ref Range    Levetiracetam Lvl <2.0 (L) 6.0 - 46.0 ug/mL    KEPPRA Dose Amt Unknown    POCT Glucose   Result Value Ref Range    Glucose 122 mg/dL    QC OK? yes    POCT Glucose   Result Value Ref Range    POC Glucose 122 (H) 70 - 99 mg/dl    Performed on ACCU-CHEK         Radiographs (if obtained):  []  The following radiograph was interpreted by myself in the absence of a radiologist:  [x]  Radiologist's Report Reviewed:  n/a    EKG (if obtained): (All EKG's are interpreted by myself in the absence of a cardiologist)  Initial EKG on my interpretation shows n/a  MDM:  Differential diagnosis: Hypoglycemia, seizure, medication noncompliance    Labs unremarkable with the exception of Keppra level less than 2. IV keppra loaded in the ED and discharged with seizure precautions and follow-up. Pt states he has Keppra script at home and I have given script also in case he has lost prior prescription.  Additionally I have given him strict precautions regarding swimming, operating heavy machinery, operate motor vehicles, climbing heights and he voices understanding.  Yesterday he received an oral keppra dose per his report at [facility], I feel today's IV loading dose should give him appropriate coverage so he can get his Keppra felt    Old records reviewed. Labs and imaging reviewed and results discussed with patient.        Patient was given scripts for the following medications. I counseled patient how to take these  medications.   Discharge Medication List as of 06/03/2016  5:15 PM      START taking these medications    Details   !! levETIRAcetam (KEPPRA) 500 MG tablet Take 1 tablet by mouth 2 times daily for 14 days, Disp-28 tablet, R-0Print       !! - Potential duplicate medications found. Please discuss with provider.            CRITICAL CARE TIME   Total Critical Care time was 0 minutes, excluding separately reportable procedures.  There was a high probability of clinically significant/life threatening deterioration in the patient's condition which required my urgent intervention.     Clinical Impression:  1. Seizure disorder (HCC)       (Please note that portions of this note may have been completed with a voice recognition program. Efforts were made to edit the dictations but occasionally words are mis-transcribed.)    Francis Dowse, MD          Dewaine Conger, MD  06/04/16 [phone]

## 2016-08-16 DIAGNOSIS — R569 Unspecified convulsions: Secondary | ICD-10-CM

## 2016-08-16 NOTE — ED Notes (Signed)
Lab called to come collect labs. Dr Lowella Dell to attempt new IV/Labs     Vennie Homans, RN  08/16/16 2146

## 2016-08-16 NOTE — ED Provider Notes (Signed)
Physician in Triage Note       I was called stat into 5 because my partner was busy with another critical patient.  Patient has a known seizure disorder and is brought in by EMS.  Had 2 seizures at home but when he got here he had a third.  There is some question as to his compliance with his Keppra.  Patient appeared postictal but was nonfocal.  He was awake.  I ordered 2 mg Ativan BASIC labs a dose of IV Keppra and a Keppra level.    For further details please see the other provider's documentation.    Synetta FailWilliam E Letrell Attwood, MD  Attending Emergency Physician          Synetta FailWilliam E Linford Quintela, MD  08/16/16 2101

## 2016-08-16 NOTE — ED Provider Notes (Signed)
The nursing staff is unable to obtain satisfactory lab draw/IV access after multiple attempts, so I place an ultrasound guided IV.  The patient is placed in appropriate position, ultrasound is utilized to identify an appropriate vessel in the right forearm. The site is prepped with chlorhexidine, and an 20 extended length catheter is placed using cross-sectional view.  The needle is advanced under ultrasound guidance within the vessel, and the catheter is placed.  I am able to draw blood, as well as flush with normal saline without difficulty.  The line is secured.      Virgel Gessyan P Berania Peedin, MD  08/16/16 2154

## 2016-08-16 NOTE — ED Provider Notes (Addendum)
[facility] EMERGENCY DEPT  eMERGENCY dEPARTMENT eNCOUnter        Pt Name: Roy Weaver  MRN: [id number]  Birthdate 1985/12/12  Date of evaluation: 08/16/2016  Provider: Amalia Greenhouse, MD  PCP: Baldwin Jamaica, MD      CHIEF COMPLAINT       Chief Complaint   Patient presents with   . Seizures       HISTORY OF PRESENT ILLNESS   (Location/Symptom, Timing/Onset, Context/Setting, Quality, Duration, Modifying Factors, Severity)  Note limiting factors.     Roy Weaver is a 31 y.o. male   seizure. Patient presents with a seizure. He has a history of seizure disorder. Apparently he had 2 seizures at home which she said lasted a minute or 2. He arrived alert and then had a seizure here and was postictal seizure lasted approximately one minute he was given lorazepam. He is denying any headache no chest pain shortness of breath no abdominal pain no fevers or chills.    Nursing Notes were all reviewed and agreed with or any disagreements were addressed  in the HPI.    REVIEW OF SYSTEMS    (2-9 systems for level 4, 10 or more for level 5)     Review of Systems   Constitutional: Negative for chills and fever.   HENT: Negative for congestion, ear pain, rhinorrhea and sore throat.    Eyes: Negative for pain, discharge and itching.   Respiratory: Negative for cough and shortness of breath.    Cardiovascular: Negative for chest pain and palpitations.   Gastrointestinal: Negative for abdominal pain, diarrhea, nausea and vomiting.   Genitourinary: Negative for difficulty urinating, dysuria and hematuria.   Musculoskeletal: Negative for arthralgias and back pain.   Skin: Negative for rash and wound.   Neurological: Negative for dizziness,Positive for seizures and negative headaches.   Psychiatric/Behavioral: Negative for dysphoric mood. The patient is not nervous/anxious.        Positives and Pertinent negatives as per HPI.  Except as noted above in the ROS, all other systems were reviewed and negative.       PAST MEDICAL  HISTORY     Past Medical History:   Diagnosis Date   . AKI (acute kidney injury) (HCC)    . Hepatitis C 07/23/2015   . Heroin abuse    . Migraine    . Psychiatric problem    . Seizures (HCC)    . Traumatic rhabdomyolysis (HCC)          SURGICAL HISTORY     History reviewed. No pertinent surgical history.      CURRENT MEDICATIONS       Discharge Medication List as of 08/17/2016  7:26 AM          ALLERGIES     Patient has no known allergies.    FAMILY HISTORY       Family History   Problem Relation Age of Onset   . Diabetes Father           SOCIAL HISTORY       Social History     Social History   . Marital status: Single     Spouse name: N/A   . Number of children: 0   . Years of education: 36     Social History Main Topics   . Smoking status: Current Some Day Smoker     Packs/day: 0.50     Years: 2.00     Types: Cigarettes   .  Smokeless tobacco: Former Neurosurgeon     Quit date: 10/04/2013   . Alcohol use 0.0 oz/week      Comment: occasioanly   . Drug use: Yes     Types: Marijuana, IV      Comment: past heorin IV.   Marland Kitchen Sexual activity: Not Currently     Other Topics Concern   . None     Social History Narrative    ** Merged History Encounter **            SCREENINGS             PHYSICAL EXAM    (up to 7 for level 4, 8 or more for level 5)     ED Triage Vitals   BP Temp Temp src Pulse Resp SpO2 Height Weight   -- -- -- -- -- -- -- --       Physical Exam   Constitutional: Patient is oriented Person. Patient appears well-developed and well-nourished. I saw the patient after he had seized and received lorazepam   HENT:   Head: Normocephalic and atraumatic.   Right Ear: External ear normal.   Left Ear: External ear normal.   Nose: Nose normal.   Mouth/Throat: Oropharynx is clear and moist.   Eyes: Conjunctivae are normal. Pupils are equal, round, and reactive to light. Right eye exhibits no discharge. Left eye exhibits no discharge. No scleral icterus.   Neck: Normal range of motion. Neck supple. No tracheal deviation present.    Cardiovascular: Normal rate and regular rhythm.  Exam reveals no gallop and no friction rub.    No murmur heard.  Pulmonary/Chest: Effort normal and breath sounds normal. No stridor. No respiratory distress. Patient has no wheezes. Patient has no rales.   Abdominal: Soft. Bowel sounds are normal. There is no tenderness. There is no rebound and no guarding.   Musculoskeletal: Normal range of motion. Patient exhibits no edema.   Neurological: Patient is alert and oriented to person only arousable with eye opening to call. Coordination normal.  motor 5 out of 5 symmetric  Skin: Skin is warm and dry. Patient is not diaphoretic. No pallor.   Psychiatric: Patient has a normal mood and affect. Patient's behavior is normal.       DIAGNOSTIC RESULTS   LABS:    Labs Reviewed   CBC WITH AUTO DIFFERENTIAL - Abnormal; Notable for the following:        Result Value    Hematocrit 38.3 (*)     Lymphocytes # 0.8 (*)     All other components within normal limits    Narrative:     Performed at:  [facility]  [address] [place], [place] [address]   Phone [phone]   BASIC METABOLIC PANEL W/ REFLEX TO MG FOR LOW K  - Abnormal; Notable for the following:     CREATININE 0.7 (*)     All other components within normal limits    Narrative:     Performed at:  [facility]  [address] [place], [place] [address]   Phone [phone]   LEVETIRACETAM LEVEL - Abnormal; Notable for the following:     Levetiracetam Lvl 4.0 (*)     All other components within normal limits    Narrative:     Performed at:  [facility]  [address] [place], [place] [address]   Phone [phone]       All other labs were within normal range or not returned as of this dictation.    EKG: All EKG's are interpreted by the Emergency Department Physician who either signs or Co-signs this chart in the absence of a cardiologist.        RADIOLOGY:   Non-plain  film images such as CT, Ultrasound and MRI are read by the radiologist. Plain radiographic images are visualized and preliminarily interpreted by the  ED Provider with the below findings:        Interpretation per the Radiologist below, if available at the time of this note:    No orders to display         PROCEDURES   Unless otherwise noted below, none     Procedures    CRITICAL CARE TIME   N/A    CONSULTS:  None    EMERGENCY DEPARTMENT COURSE and DIFFERENTIAL DIAGNOSIS/MDM:   Vitals:    Vitals:    08/17/16 0531 08/17/16 0546 08/17/16 0602 08/17/16 0746   BP: (!) 136/96 (!) 143/101 (!) 145/106 118/78   Pulse: 98 103 100 78   Resp: (!) 36 29 30 18    Temp:    98 F (36.7 C)   TempSrc:       SpO2: 97% 98% 97%    Weight:           Patient was given the following medications:  Medications   levetiracetam (KEPPRA) 500 mg/100 mL IVPB (0 mg Intravenous Stopped 08/16/16 2217)   LORazepam (ATIVAN) injection 2 mg (2 mg Intramuscular Given 08/16/16 2036)   ammonia inhaler (  Given 08/17/16 0428)   levetiracetam (KEPPRA) 1000 mg/100 mL IVPB (0 mg Intravenous Stopped 08/17/16 0740)       Patient presents with seizure.  He has a history of seiaures.  He had a winessed seizure in the department and was treaet with lorazepam and keppra. WBC 4.3 chemistries unremarkable, keppra level 4.0 low. On repeat exam pateint was somnolent and required more time to become alert.  Care of the patient was transferred to Dr Lowella Dell at the change of shift.    The patient tolerated their visit well.   The patient and / or the family were informed of the results of any tests, a time was given to answer questions.    FINAL IMPRESSION      1. Seizure [facility])          DISPOSITION/PLAN   DISPOSITION Decision To Discharge 08/17/2016 07:18:53 AM      PATIENT REFERRED TO:  Baldwin Jamaica, MD  [address] [place] [address]  [phone]    Schedule an appointment as soon as possible for a visit       [facility]  [address] [place]  [place] [address]  [phone]    As needed, If symptoms worsen    Salvadore Farber, MD  [address], [address]  [place] [address]  [phone]    Call today        DISCHARGE MEDICATIONS:  Discharge Medication List as of 08/17/2016  7:26 AM          DISCONTINUED MEDICATIONS:  Discharge Medication List as of 08/17/2016  7:26 AM                 (Please note that portions of this note were completed with a voice recognition program.  Efforts were made to edit the dictations  but occasionally words are mis-transcribed.)    Amalia Greenhouse, MD (electronically signed)            Amalia Greenhouse, MD  08/17/16 1051       Amalia Greenhouse, MD  08/18/16 [phone]

## 2016-08-16 NOTE — ED Notes (Addendum)
Patient arrives via [facility] for reports of seizures at home. Per report, patient was post ictal upon their arrival. By the arrival to ER, EMS walked patient to room from stretcher. Patient A&O, but needed repeated reminders for removal of gown and getting into bed. Patient informed RN that he HAS been taking his Keppra as directed for his seizures but he had two at home that probably lasted 1-2 minutes each. Patient denies head pain or injury- states he was laying on the couch. Staff assist called for full body seizure. Due to lack of IV patient given ativan IM in left deltoid but required multiple attempts due to patient moving/grabbing.   Vennie HomansLaura Ebelyn Bohnet, RN  08/16/16 [phone]       Vennie HomansLaura Laakea Pereira, RN  08/16/16 2100

## 2016-08-17 ENCOUNTER — Inpatient Hospital Stay
Admit: 2016-08-17 | Discharge: 2016-08-17 | Disposition: A | Discharge status: Home / Self Care | Attending: Emergency Medicine

## 2016-08-17 LAB — CBC WITH AUTO DIFFERENTIAL
Basophils %: 1 %
Basophils Absolute: 0 10*3/uL (ref 0.0–0.2)
Eosinophils %: 2.2 %
Eosinophils Absolute: 0.1 10*3/uL (ref 0.0–0.6)
Hematocrit: 38.3 % — ABNORMAL LOW (ref 40.5–52.5)
Hemoglobin: 13.6 g/dL (ref 13.5–17.5)
Lymphocytes %: 18.5 %
Lymphocytes Absolute: 0.8 10*3/uL — ABNORMAL LOW (ref 1.0–5.1)
MCH: 31 pg (ref 26.0–34.0)
MCHC: 35.4 g/dL (ref 31.0–36.0)
MCV: 87.8 fL (ref 80.0–100.0)
MPV: 7.4 fL (ref 5.0–10.5)
Monocytes %: 6.4 %
Monocytes Absolute: 0.3 10*3/uL (ref 0.0–1.3)
Neutrophils %: 71.9 %
Neutrophils Absolute: 3.1 10*3/uL (ref 1.7–7.7)
Platelets: 235 10*3/uL (ref 135–450)
RBC: 4.37 M/uL (ref 4.20–5.90)
RDW: 13 % (ref 12.4–15.4)
WBC: 4.3 10*3/uL (ref 4.0–11.0)

## 2016-08-17 LAB — BASIC METABOLIC PANEL W/ REFLEX TO MG FOR LOW K
Anion Gap: 11 (ref 3–16)
BUN: 11 mg/dL (ref 7–20)
CO2: 28 mmol/L (ref 21–32)
Calcium: 8.7 mg/dL (ref 8.3–10.6)
Chloride: 104 mmol/L (ref 99–110)
Creatinine: 0.7 mg/dL — ABNORMAL LOW (ref 0.9–1.3)
GFR African American: >60 (ref >60)
GFR Non-African American: >60 (ref >60)
Glucose: 99 mg/dL (ref 70–99)
Potassium reflex Magnesium: 4.3 mmol/L (ref 3.5–5.1)
Sodium: 143 mmol/L (ref 136–145)

## 2016-08-17 LAB — LEVETIRACETAM LEVEL: Levetiracetam Lvl: 4 ug/mL — ABNORMAL LOW (ref 6.0–46.0)

## 2016-08-17 MED ORDER — AMMONIA AROMATIC IN INHA
RESPIRATORY_TRACT | Status: AC
Start: 2016-08-17 — End: 2016-08-17
  Administered 2016-08-17: 08:00:00

## 2016-08-17 MED ORDER — LORAZEPAM 2 MG/ML IJ SOLN
2 MG/ML | INTRAMUSCULAR | Status: DC
Start: 2016-08-17 — End: 2016-08-17

## 2016-08-17 MED ORDER — LEVETIRACETAM 500 MG PO TABS
500 MG | ORAL_TABLET | Freq: Two times a day (BID) | ORAL | 0 refills | Status: DC
Start: 2016-08-17 — End: 2016-12-29

## 2016-08-17 MED ORDER — LEVETIRACETAM IN NACL 1000 MG/100ML IV SOLN
1000 MG/100ML | Freq: Once | INTRAVENOUS | Status: AC
Start: 2016-08-17 — End: 2016-08-17
  Administered 2016-08-17: 11:00:00 1000 mg via INTRAVENOUS

## 2016-08-17 MED ORDER — LEVETIRACETAM IN NACL 500 MG/100ML IV SOLN
500 MG/100ML | Freq: Once | INTRAVENOUS | Status: AC
Start: 2016-08-17 — End: 2016-08-16
  Administered 2016-08-17: 02:00:00 500 mg via INTRAVENOUS

## 2016-08-17 MED ORDER — LORAZEPAM 2 MG/ML IJ SOLN
2 MG/ML | Freq: Once | INTRAMUSCULAR | Status: AC
Start: 2016-08-17 — End: 2016-08-16
  Administered 2016-08-17: 01:00:00 2 mg via INTRAMUSCULAR

## 2016-08-17 MED ORDER — LORAZEPAM 2 MG/ML IJ SOLN
2 MG/ML | Freq: Once | INTRAMUSCULAR | Status: DC
Start: 2016-08-17 — End: 2016-08-16

## 2016-08-17 MED ORDER — DEXTROSE 5 % IV SOLN
5 % | Freq: Once | INTRAVENOUS | Status: DC
Start: 2016-08-17 — End: 2016-08-17

## 2016-08-17 MED FILL — LEVETIRACETAM IN NACL 500 MG/100ML IV SOLN: 500 MG/100ML | INTRAVENOUS | Qty: 100

## 2016-08-17 MED FILL — LEVETIRACETAM IN NACL 1000 MG/100ML IV SOLN: 1000 MG/100ML | INTRAVENOUS | Qty: 100

## 2016-08-17 MED FILL — VIMPAT 200 MG/20ML IV SOLN: 200 MG/20ML | INTRAVENOUS | Qty: 10

## 2016-08-17 MED FILL — LORAZEPAM 2 MG/ML IJ SOLN: 2 MG/ML | INTRAMUSCULAR | Qty: 1

## 2016-08-17 MED FILL — AMMONIA AROMATIC IN INHA: RESPIRATORY_TRACT | Qty: 10

## 2016-08-17 NOTE — ED Notes (Signed)
D/C: Order noted for d/c. Pt confirmed d/c paperwork and 1 script have correct name. Discharge and education instructions reviewed with patient. Teach-back successful.  Pt verbalized understanding and signed d/c papers. Pt denied questions at this time. No acute distress noted. Patient instructed to follow-up as noted - return to emergency department if symptoms worsen. Patient verbalized understanding. Discharged per EDMD with discharge instructions. Pt discharged per ambulation  to private vehicle. Patient stable upon departure. Thanked patient for choosing [facility] for care.          Deon Pilling, RN  08/17/16 [phone]

## 2016-08-17 NOTE — ED Notes (Signed)
Charge RN notified of "issues outside with a patient". Upon investigation, patient and his mother were ambulating outside to vehicle patient had another seizure. [facility] who placed patient on a stretcher and brought patient back inside. Patient placed back into room and on monitor. Primary RN notified of patient return to room as well as MD.      Vennie HomansLaura Kasi Lasky, RN  08/17/16 [phone]

## 2016-08-17 NOTE — ED Notes (Signed)
Patient ambulated,tolerated well.     Sissy Hoff, RN  08/17/16 0430

## 2016-12-29 ENCOUNTER — Ambulatory Visit
Admit: 2016-12-29 | Discharge: 2016-12-29 | Payer: PRIVATE HEALTH INSURANCE | Attending: Internal Medicine | Primary: Internal Medicine

## 2016-12-29 DIAGNOSIS — Z09 Encounter for follow-up examination after completed treatment for conditions other than malignant neoplasm: Secondary | ICD-10-CM

## 2016-12-29 MED ORDER — LEVETIRACETAM 1000 MG PO TABS
1000 | ORAL_TABLET | Freq: Two times a day (BID) | ORAL | 12 refills | Status: DC
Start: 2016-12-29 — End: 2018-01-02
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
## 2017-03-18 NOTE — Telephone Encounter (Signed)
[employer] calling to verify some Diagnosis codes for the pt. pls return his call.thank you!    Pho: [phone] ext [phone]

## 2017-03-24 NOTE — Telephone Encounter (Signed)
Called [employer] left messageto call the office or send the forms.

## 2017-03-25 NOTE — Telephone Encounter (Signed)
Richard was calling to follow up on claim forms that were faxed to the office

## 2017-04-04 NOTE — Telephone Encounter (Signed)
Richard from [employer] called to get adjusted claim forms, pls contact pt at      [id number] [phone]  GNF#[id number]

## 2017-04-05 NOTE — Telephone Encounter (Signed)
Spoke with Corrie DandyMary they will fax over forms.

## 2017-04-11 NOTE — Telephone Encounter (Signed)
Forms was faxed.

## 2017-04-18 NOTE — Telephone Encounter (Signed)
Caller: Richard @ [employer]  Pho: [phone]  Calling to request a doctors signature on the form which was faxed over on 04/13/17. He states that he has already talked to the MA concerning this matter.  pls call back with any other questions. Thank you!

## 2017-04-27 NOTE — Telephone Encounter (Signed)
????

## 2018-01-02 ENCOUNTER — Encounter

## 2018-01-03 NOTE — Telephone Encounter (Signed)
Last ov 8.18.18

## 2018-01-04 MED ORDER — LEVETIRACETAM 1000 MG PO TABS
1000 MG | ORAL_TABLET | Freq: Two times a day (BID) | ORAL | 12 refills | Status: AC
Start: 2018-01-04 — End: ?

## 2018-01-28 ENCOUNTER — Encounter

## 2018-02-02 ENCOUNTER — Encounter

## 2023-01-30 IMAGING — CT CT HEAD WITHOUT CONTRAST
3 series · 16 of 47 positions shown, 19 images · non-contrast
Comparison: 03/01/2021

FINAL REPORT:
CT HEAD WITHOUT CONTRAST
CLINICAL INDICATION: Mental status change, unknown cause
TECHNIQUE: Axial images of the brain were obtained without contrast. CT exams at this facility are performed with dose modulation, iterative reconstruction, and/or weight based dosing when appropriate to reduce radiation exposure to As Low As Reasonably Achievable (SABONIENE).

[Series 2: head stnd · axial · 0.55mm/px · z∈[-23,+122]mm · 10 of 35 slices shown, 13 images]
[im 3/35  brain]
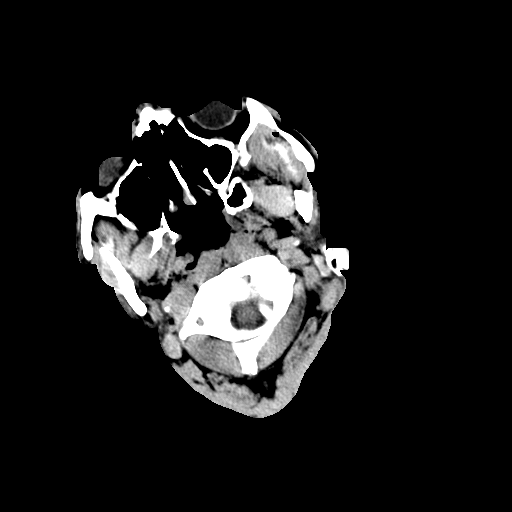
[im 3/35  bone]
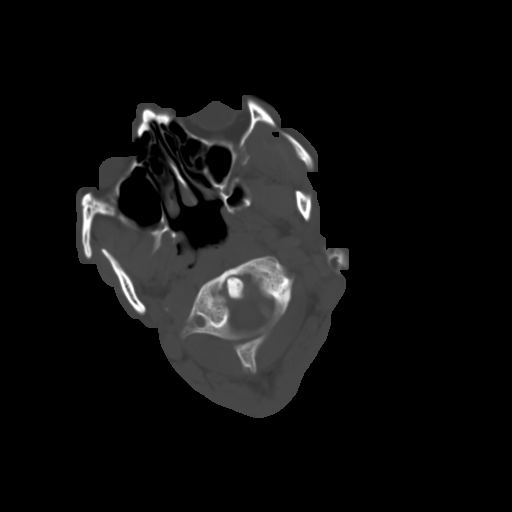
[im 6/35  brain]
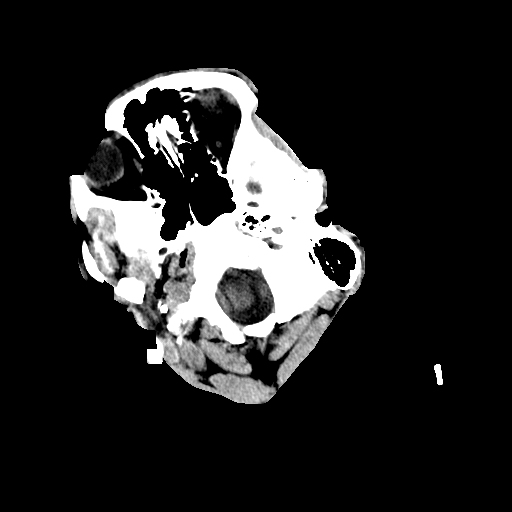
[im 10/35  brain]
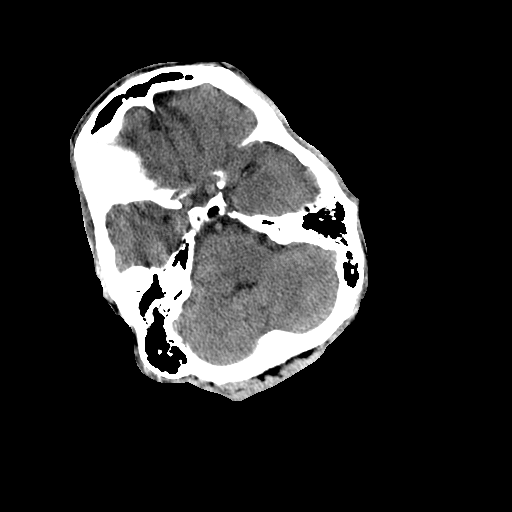
[im 12/35  brain]
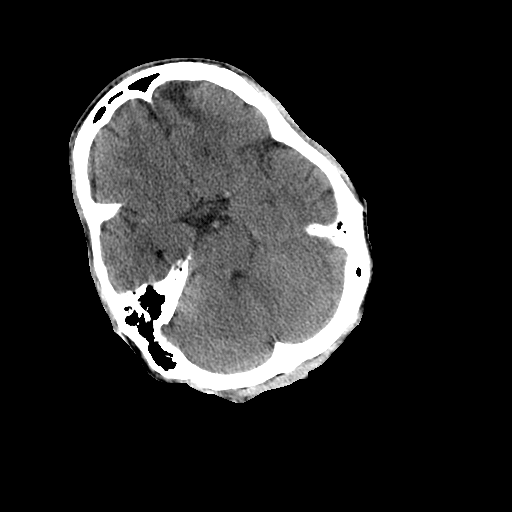
[im 16/35  brain]
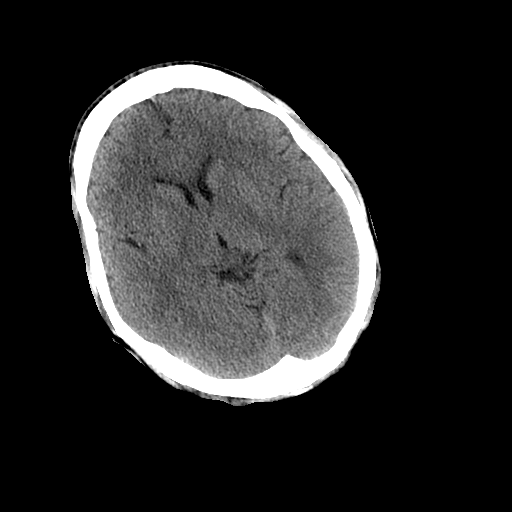
[im 16/35  bone]
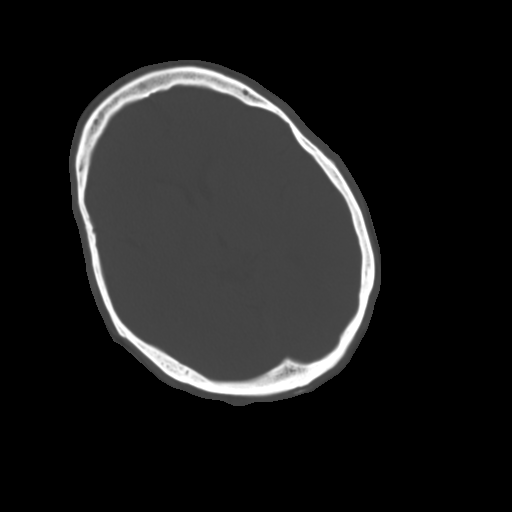
[im 19/35  brain]
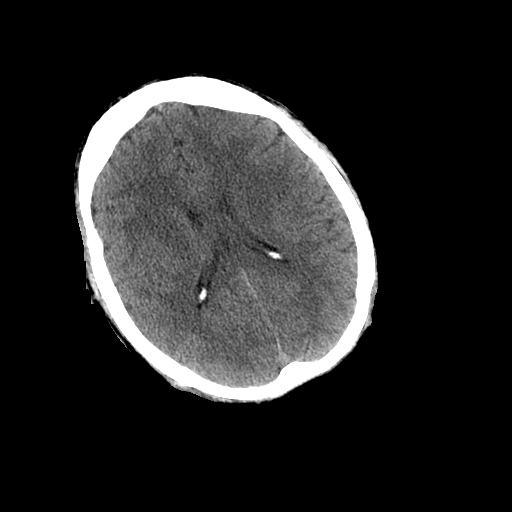
[im 23/35  brain]
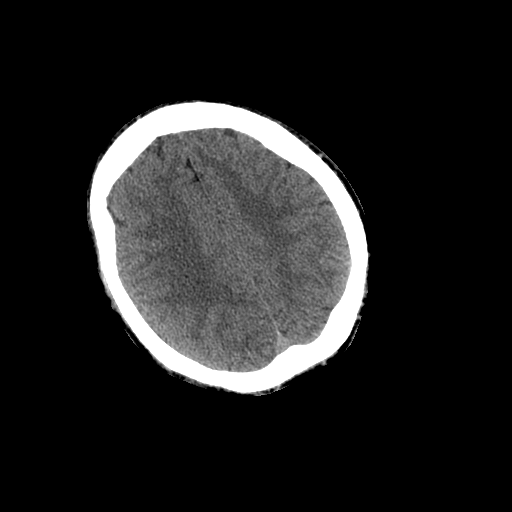
[im 26/35  brain]
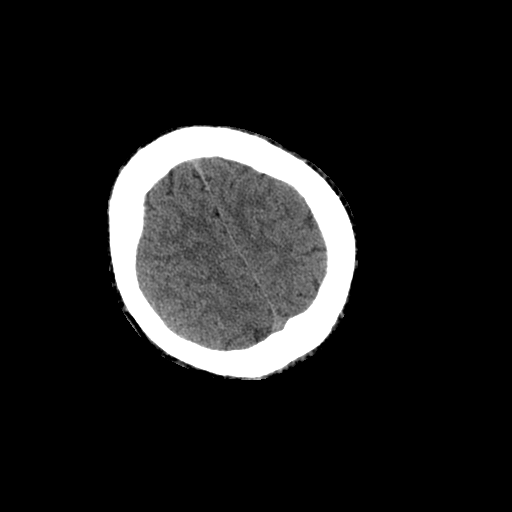
[im 29/35  brain]
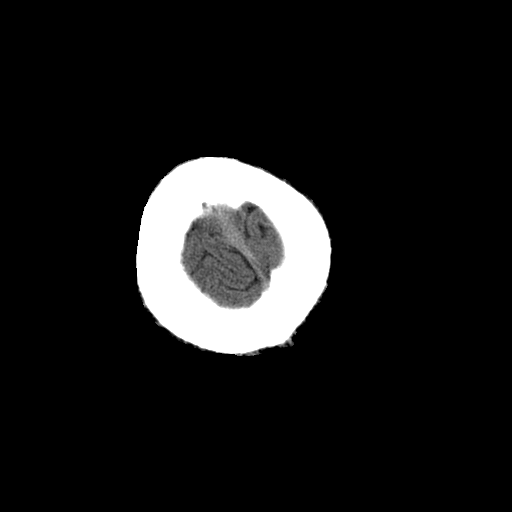
[im 29/35  bone]
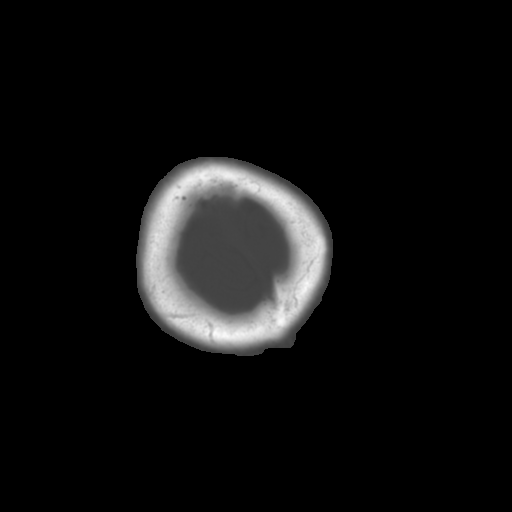
[im 32/35  brain]
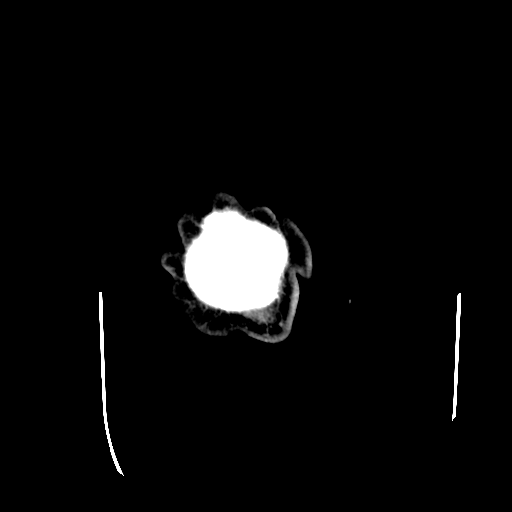

[Series 601: cor head · coronal · 0.55mm/px · 3 of 110 slices shown]
[im 37/110  brain]
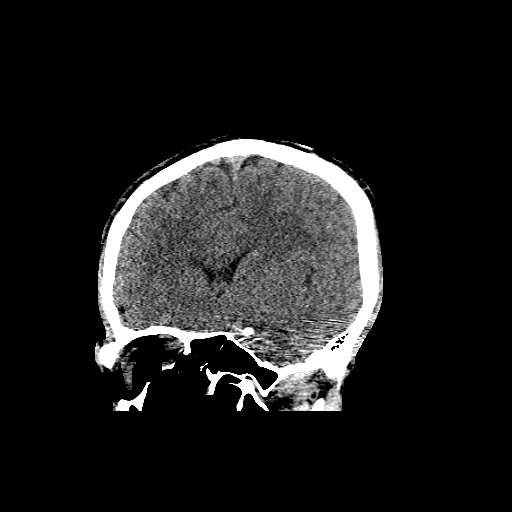
[im 49/110  brain]
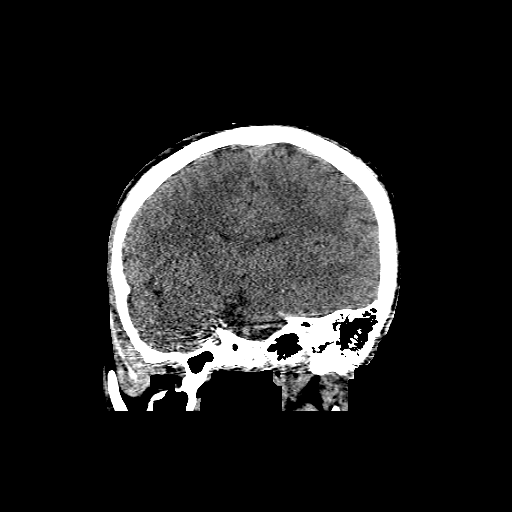
[im 61/110  brain]
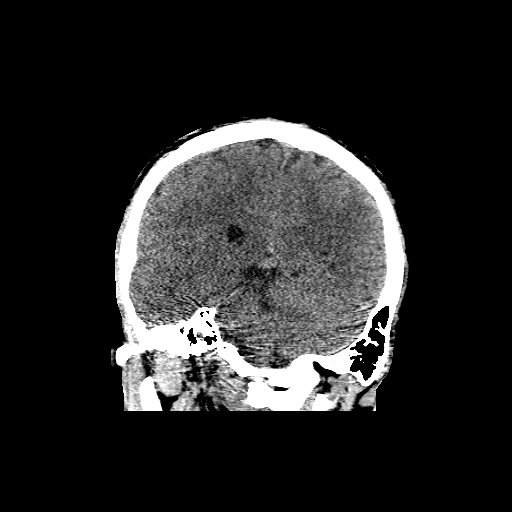

[Series 602: sag head · sagittal · 0.55mm/px · 3 of 94 slices shown]
[im 32/94  brain]
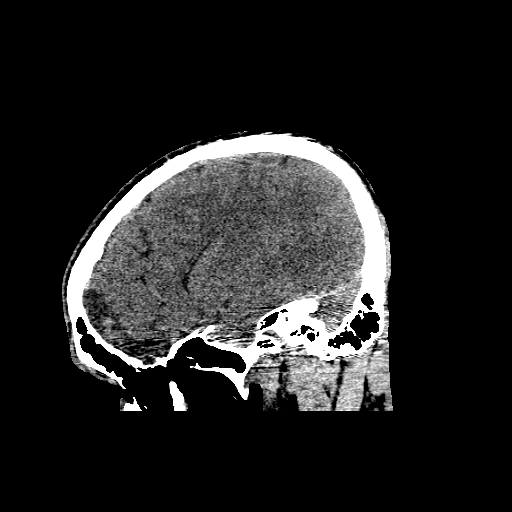
[im 47/94  brain]
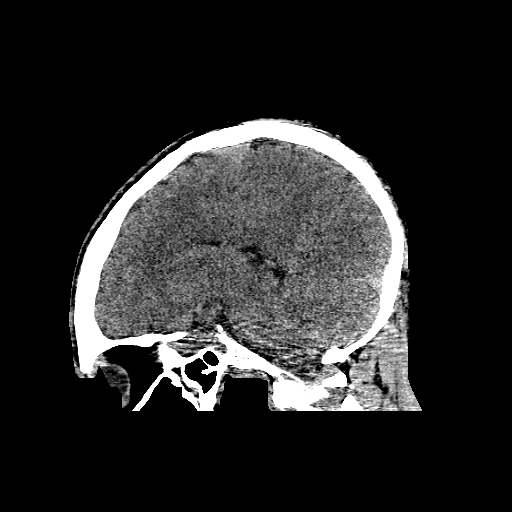
[im 63/94  brain]
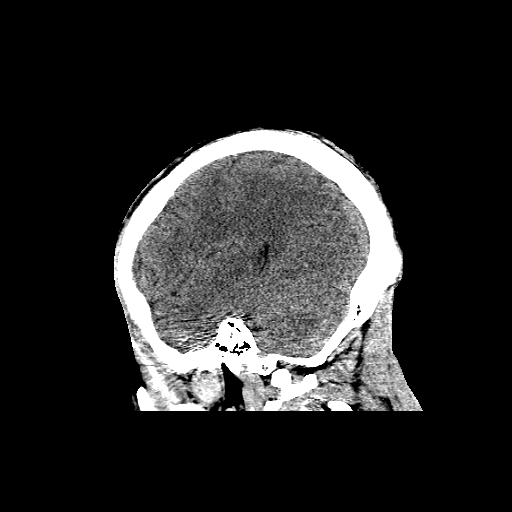

[16 of 47 positions shown; findings below may reference images not displayed]

FINDINGS: No evidence of acute large territory infarct. Stable encephalomalacia in the anterior-inferior left frontal lobe. No acute intracranial hemorrhage. No hydrocephalus. No obvious mass or abnormal mass effect.
No acute or suspicious osseous abnormality.
IMPRESSION: No CT evidence of acute intracranial abnormality.
If symptoms persist, additional cross-sectional imaging such as MRI, or follow-up CT scan may be indicated. Pathology such as early ischemic events may not be visible on a noncontrast CT study.

## 2023-01-30 IMAGING — MR MRI BRAIN WITHOUT CONTRAST
13 series · 48 of 48 positions shown · non-contrast
Comparison: Earlier today.

Pt unable to hold still. Had to keep reminding not to move. Attempted 1 Paulus N sequence.
FINAL REPORT:
EXAM: MRI BRAIN WITHOUT CONTRAST
INDICATION: Seizure disorder, clinical change
TECHNIQUE: Multisequence multiplanar MRI of the brain without IV contrast.

[Series 7001: survey · sagittal · 1.6mm · 1.62mm/px · 14 of 128 slices shown]
[im 1/128]
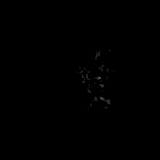
[im 10/128]
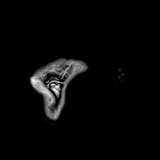
[im 20/128]
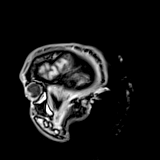
[im 30/128]
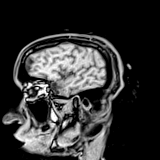
[im 40/128]
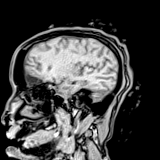
[im 49/128]
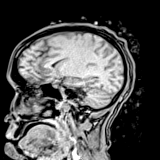
[im 59/128]
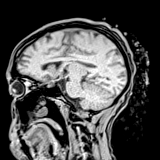
[im 69/128]
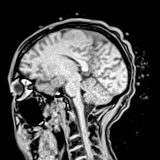
[im 79/128]
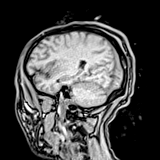
[im 88/128]
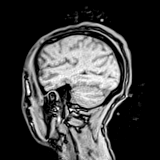
[im 98/128]
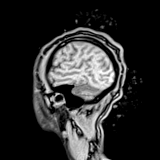
[im 108/128]
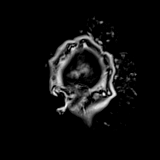
[im 118/128]
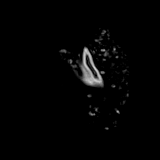
[im 128/128]
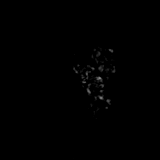

[Series 8001: survey_mpr_sag · oblique · 1.6mm · 1.60mm/px · 1 of 5 slices shown]
[im 1/5]
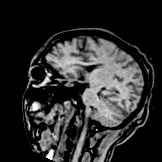

[Series 9001: survey_mpr_cor · coronal · 1.6mm · 1.60mm/px · 1 of 3 slices shown]
[im 1/3]
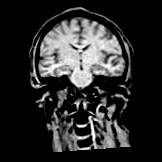

[survey_mpr_(person_name) · axial · 1.6mm · 1.60mm/px · 1 of 3 slices shown]
[im 1/3]
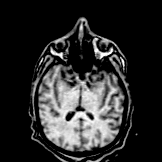

[T1 · sagittal · 5.0mm · 0.96mm/px · 2 of 27 slices shown (1 of 2)]
[im 1/27]
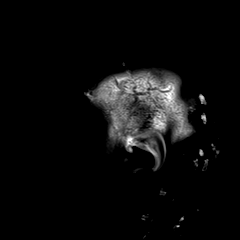
[im 27/27]
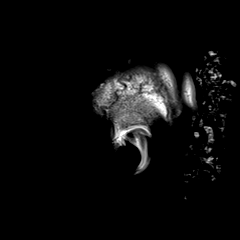

[ax dwi_tracew · axial · 5.0mm · 0.87mm/px · z∈[-61,+82]mm · 2 of 25 slices shown]
[im 1/25]
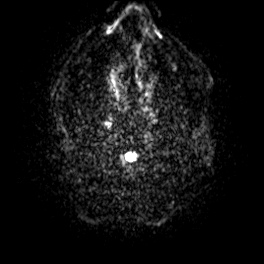
[im 25/25]
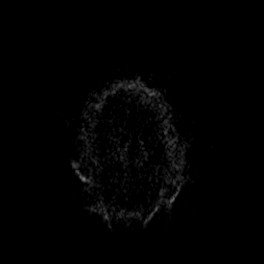

[ax dwi_adc · axial · 5.0mm · 0.87mm/px · z∈[-61,+82]mm · 3 of 25 slices shown]
[im 1/25]
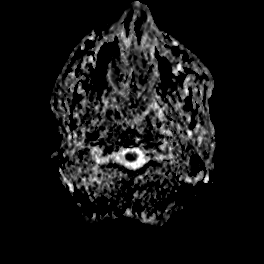
[im 13/25]
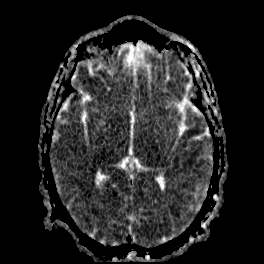
[im 25/25]
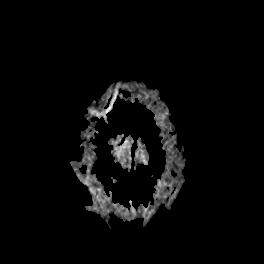

[ax dwi_exp · axial · 5.0mm · 0.87mm/px · z∈[-61,+82]mm · 3 of 25 slices shown]
[im 1/25]
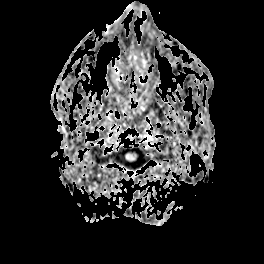
[im 13/25]
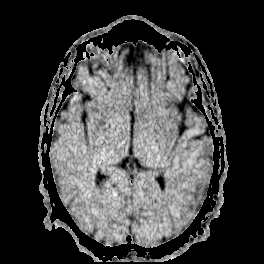
[im 25/25]
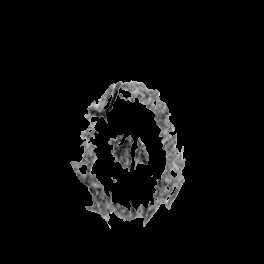

[T2 fat-sat · axial · 4.0mm · 0.90mm/px · z∈[-42,+97]mm · 4 of 30 slices shown]
[im 1/30]
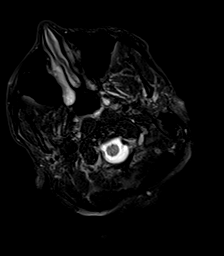
[im 10/30]
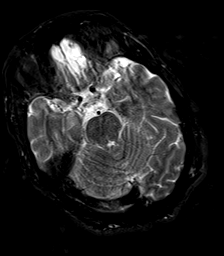
[im 20/30]
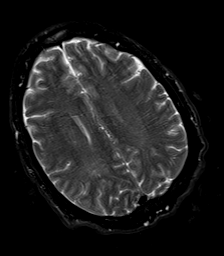
[im 30/30]
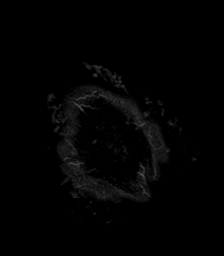

[T1 · axial · 4.0mm · 0.45mm/px · z∈[-41,+98]mm · 4 of 30 slices shown (2 of 2)]
[im 1/30]
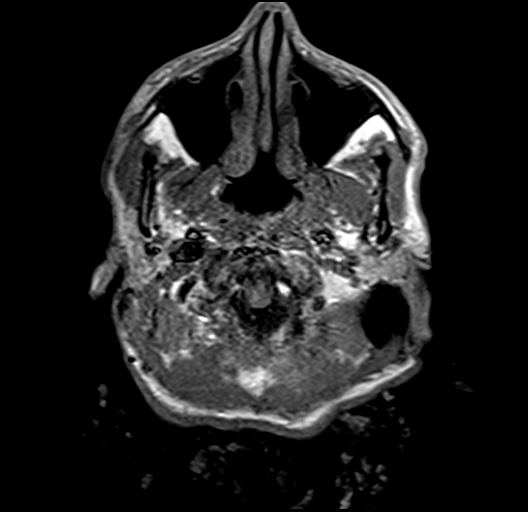
[im 10/30]
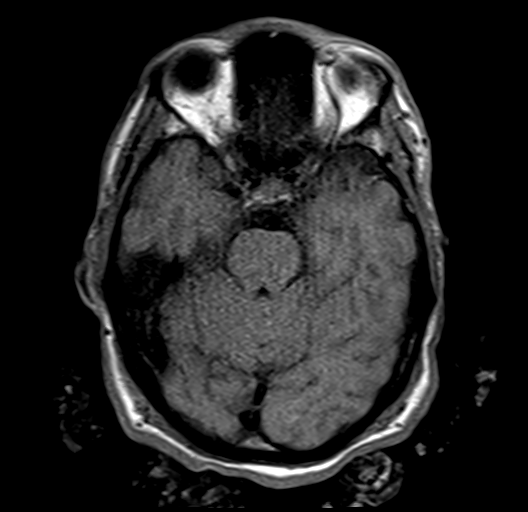
[im 20/30]
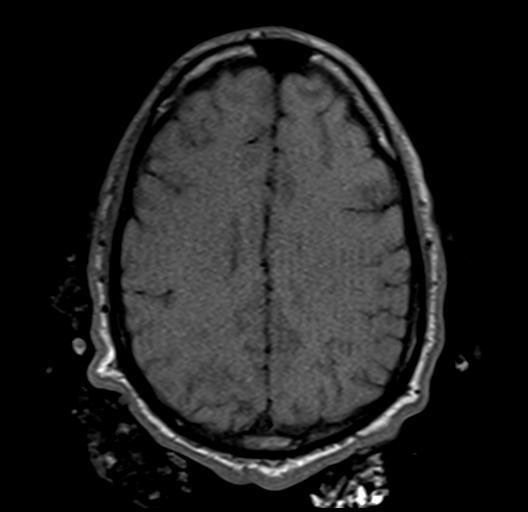
[im 30/30]
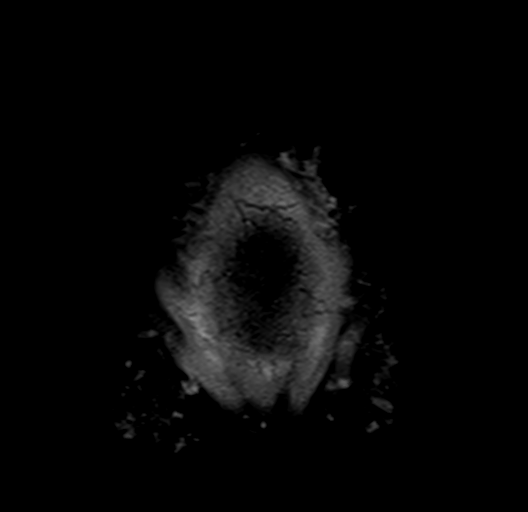

[FLAIR · axial · 5.0mm · 0.90mm/px · z∈[-62,+81]mm · 3 of 25 slices shown]
[im 1/25]
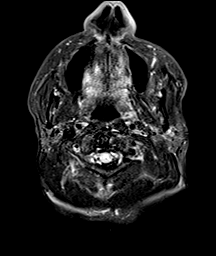
[im 13/25]
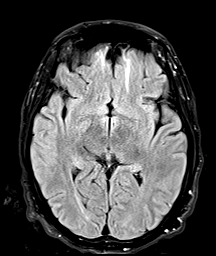
[im 25/25]
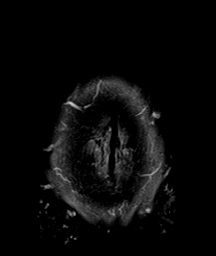

[GRE · axial · 4.0mm · 0.45mm/px · z∈[-70,+70]mm · 4 of 30 slices shown]
[im 1/30]
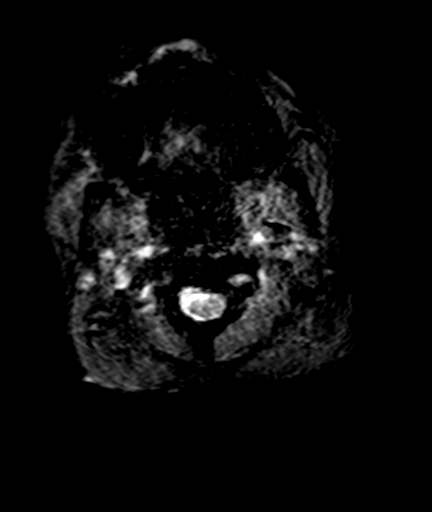
[im 10/30]
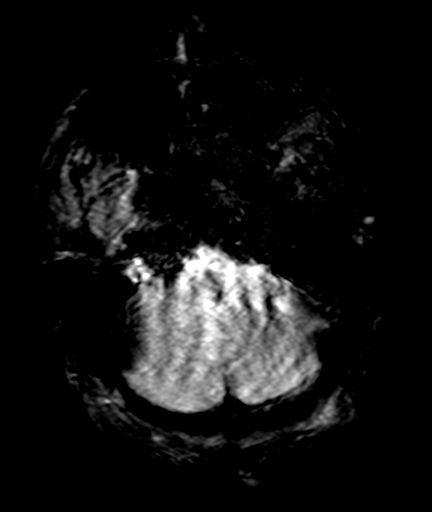
[im 20/30]
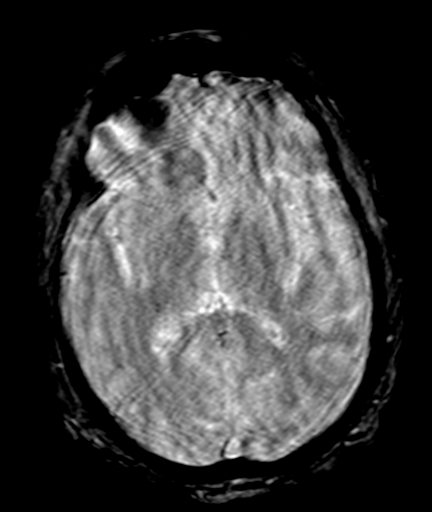
[im 30/30]
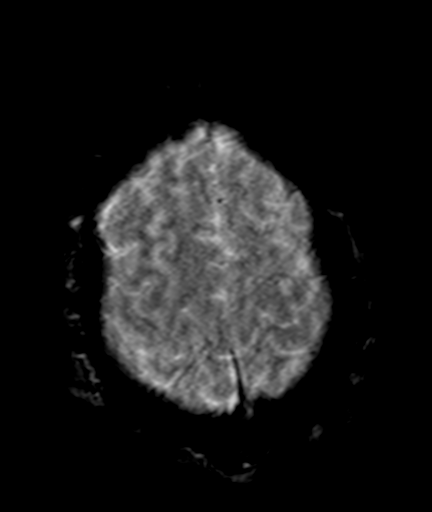

[T2 · coronal · 3.0mm · 0.72mm/px · 6 of 55 slices shown]
[im 1/55]
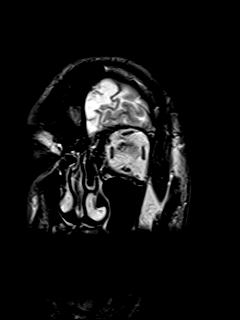
[im 11/55]
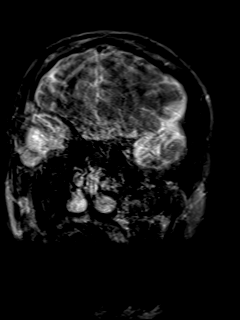
[im 22/55]
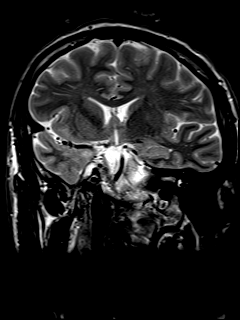
[im 33/55]
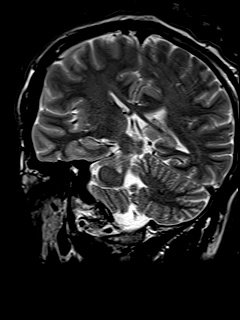
[im 44/55]
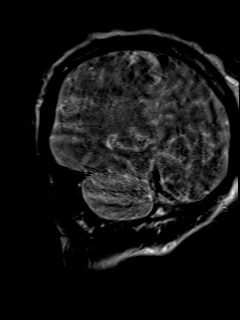
[im 55/55]
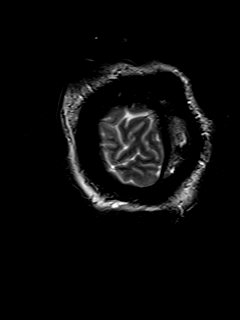

[48 of 48 positions shown; findings below may reference images not displayed]

FINDINGS: Markedly limited study secondary to patient motion.
No evidence of acute infarction, intracranial hemorrhage or mass lesion. Focal area of encephalomalacia in the left middle lobe, unchanged.
Ventricles and sulci are normal for patient's age. Mastoid air cells are clear.
Paranasal sinuses are clear.
Intracranial vascular flow voids are normal.
The globes and orbits are unremarkable. No acute calvarial abnormalities.
IMPRESSION: Markedly limited study secondary to patient motion.
No definite evidence of intracranial hemorrhage, acute infarction, or mass effect.

## 2023-06-14 IMAGING — CT CT HEAD WITHOUT CONTRAST
3 of 5 series · 17 of 47 positions shown, 20 images · non-contrast
Comparison: 01/30/2023

Seizure, lethargic
Hx of epilepsy
FINAL REPORT:
EXAM: CT HEAD WITHOUT CONTRAST
INDICATION: Mental status change, unknown cause

[Series 4: brain thin · axial · 0.49mm/px · z∈[+103,+242]mm · 11 of 413 slices shown, 14 images]
[im 33/413  brain]
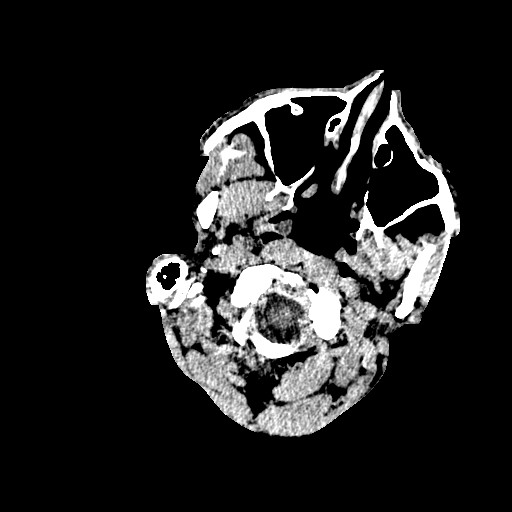
[im 33/413  bone]
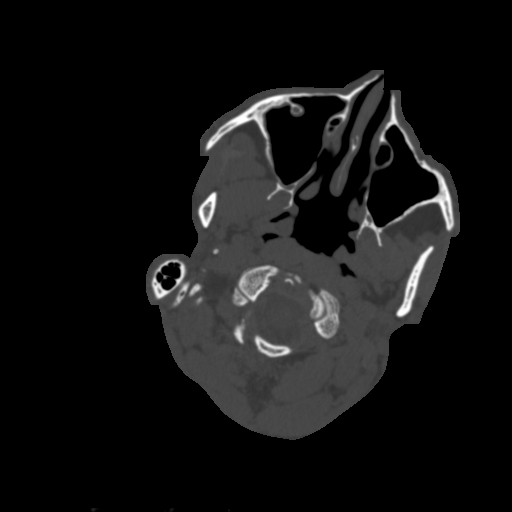
[im 66/413  brain]
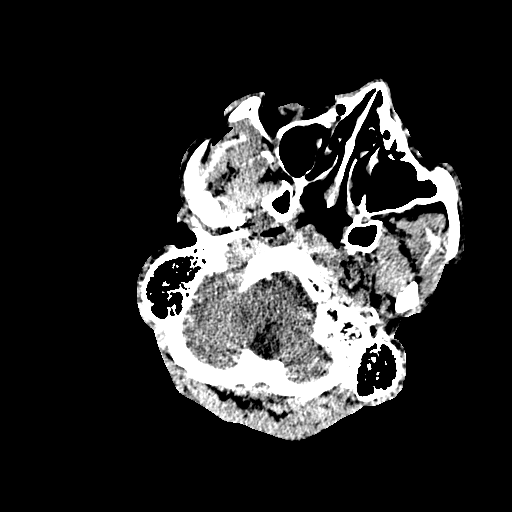
[im 99/413  brain]
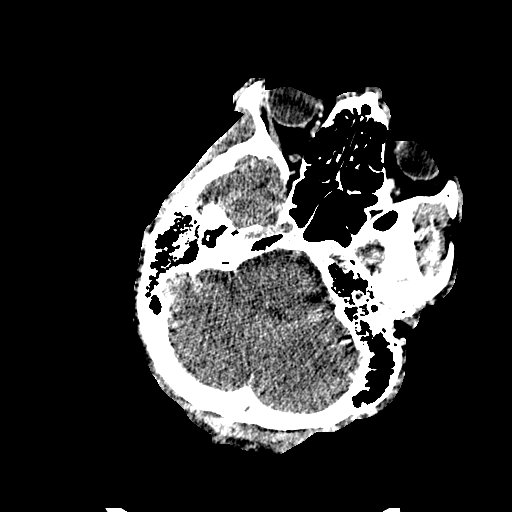
[im 132/413  brain]
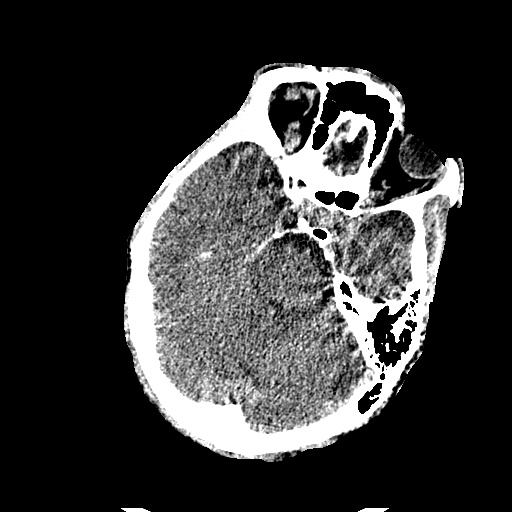
[im 165/413  brain]
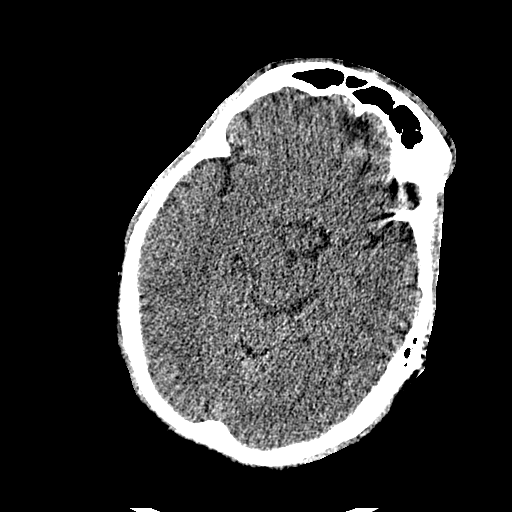
[im 165/413  bone]
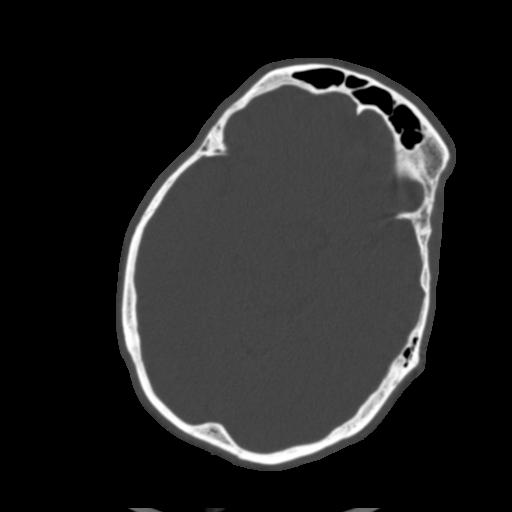
[im 215/413  brain]
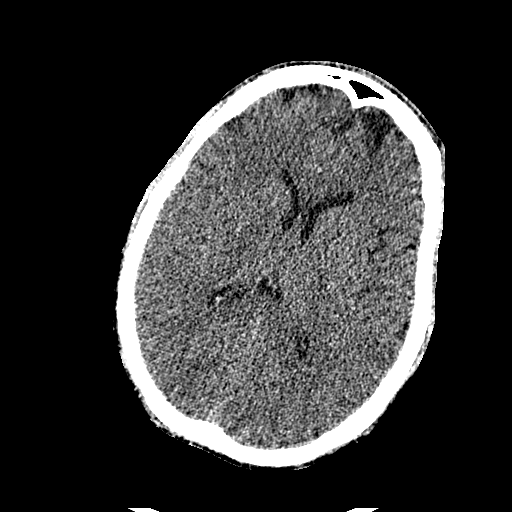
[im 248/413  brain]
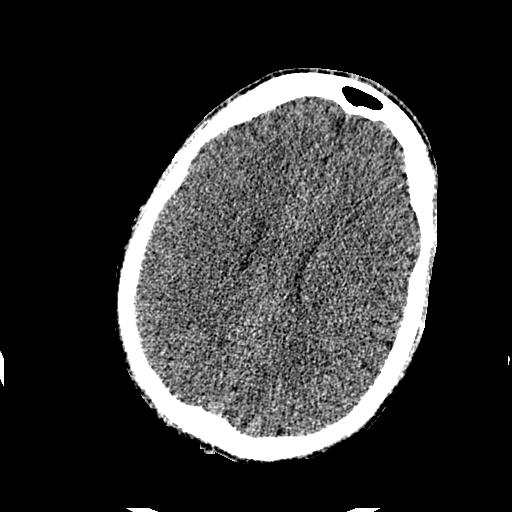
[im 281/413  brain]
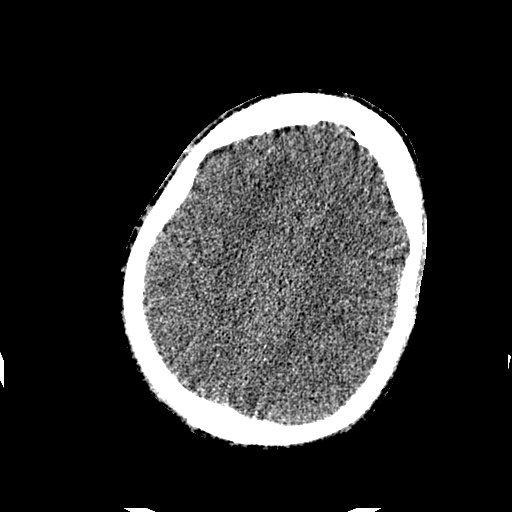
[im 314/413  brain]
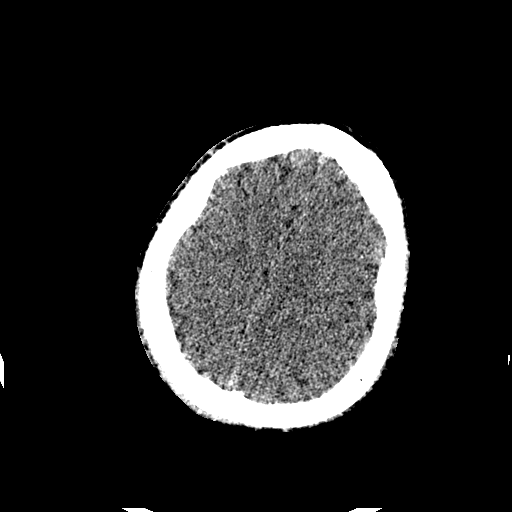
[im 314/413  bone]
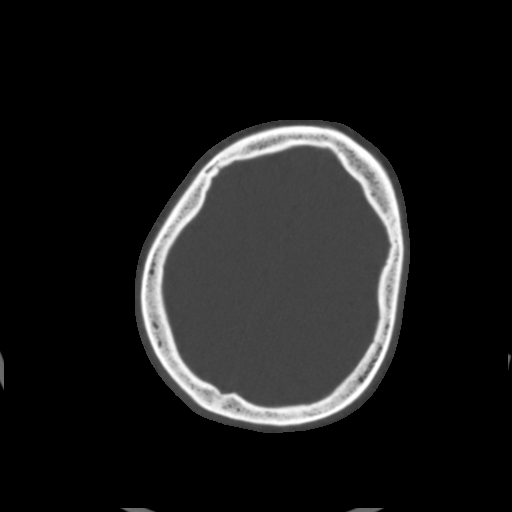
[im 347/413  brain]
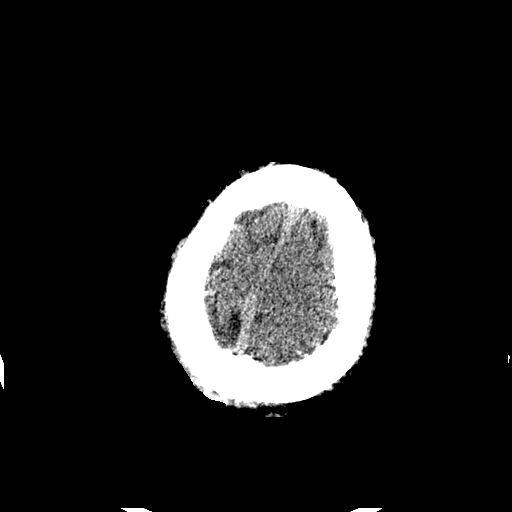
[im 380/413  brain]
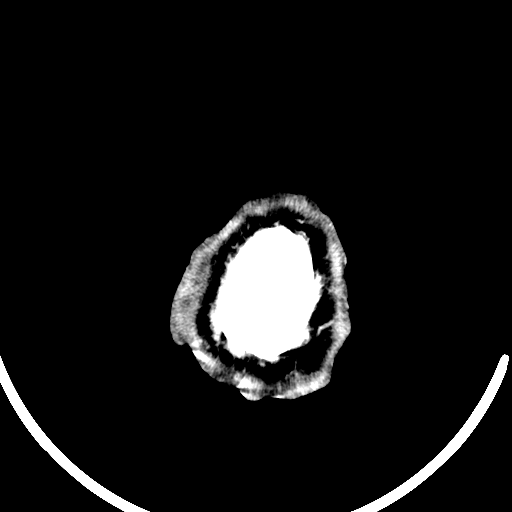

[mpr, brain thin, coronal · coronal · 0.49mm/px · 3 of 104 slices shown]
[im 35/104  brain]
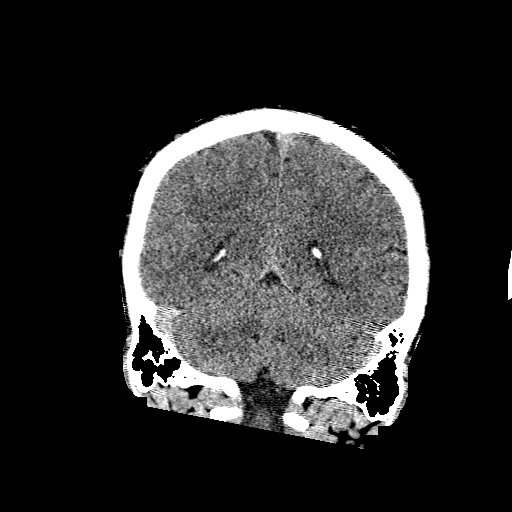
[im 46/104  brain]
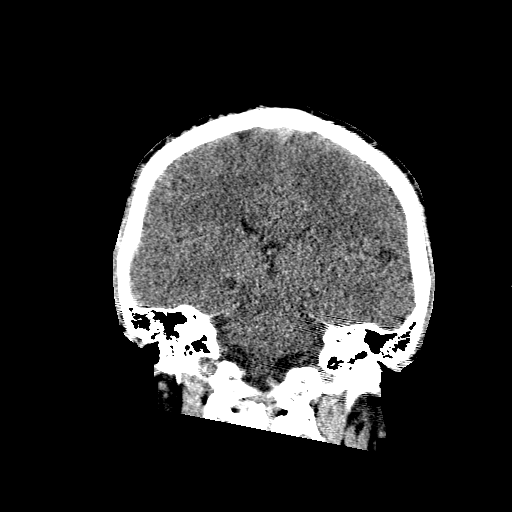
[im 58/104  brain]
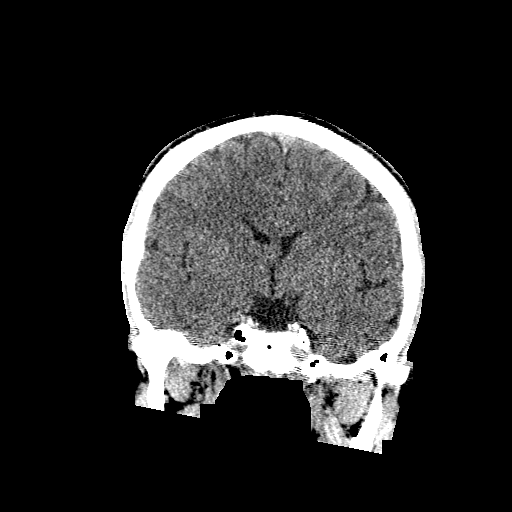

[mpr, brain thin, sagittal · sagittal · 0.49mm/px · 3 of 83 slices shown]
[im 28/83  brain]
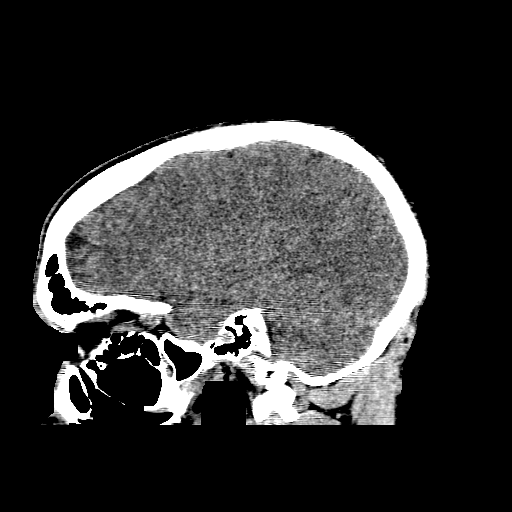
[im 42/83  brain]
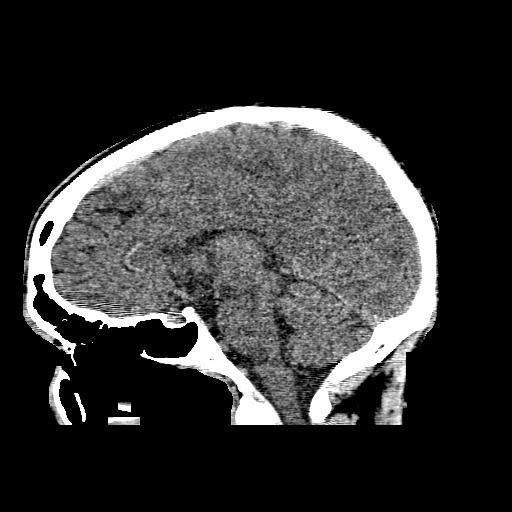
[im 55/83  brain]
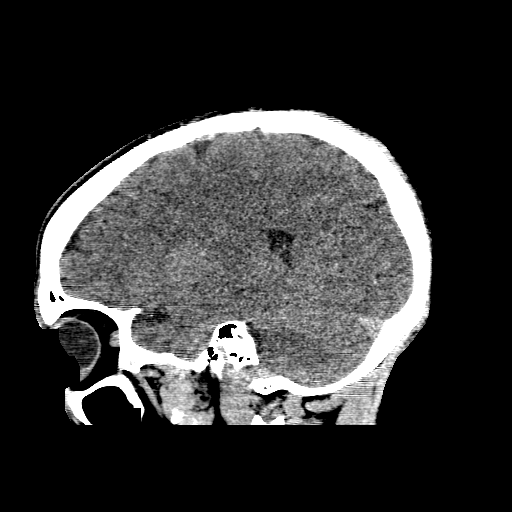

[17 of 47 positions shown; findings below may reference images not displayed]

FINDINGS: No CT evidence for acute infarct, hemorrhage, or hydrocephalus. Small amount of encephalomalacia in the anterior frontal lobes bilaterally as well as the left anterior temporal lobe probably posttraumatic. Orbital soft tissues appear intact. Visualized paranasal sinuses and mastoid air cells appear clear. No suspicious calvarial lesion.
IMPRESSION: *  No acute findings as above.

## 2023-11-07 IMAGING — MR MRI BRAIN WITH AND WITHOUT CONTRAST
13 series · 48 of 48 positions shown · IV contrast (agent unspecified)
Comparison: 01/30/2023, 06/14/2023, 03/01/2021

Pt AMS, Slight movement throughout exam. Pt wanted out of the machine after MPRAGE.
FINAL REPORT:
EXAM: MRI of the brain without and with contrast.
HISTORY: Mental status change, unknown cause
TECHNIQUE: Multisequence multiplanar images of the brain were obtained before and after administration of IV contrast.

[Series 1001: survey · sagittal · 1.6mm · 1.62mm/px · 8 of 128 slices shown]
[im 1/128]
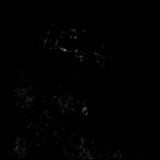
[im 19/128]
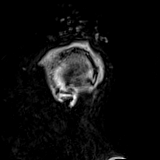
[im 37/128]
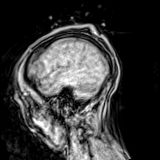
[im 55/128]
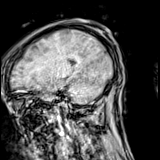
[im 73/128]
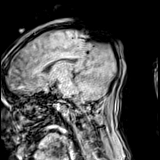
[im 91/128]
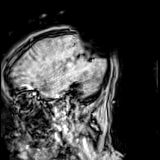
[im 109/128]
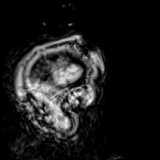
[im 128/128]
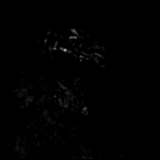

[ax dwi_tracew · axial · 5.0mm · 0.60mm/px · z∈[-107,+45]mm · 2 of 27 slices shown]
[im 1/27]
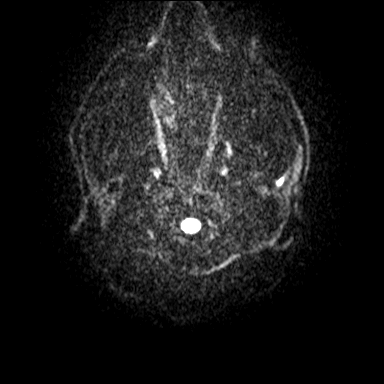
[im 27/27]
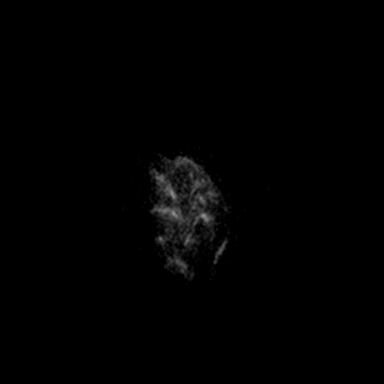

[ax dwi_adc · axial · 5.0mm · 0.60mm/px · z∈[-107,+45]mm · 2 of 27 slices shown]
[im 1/27]
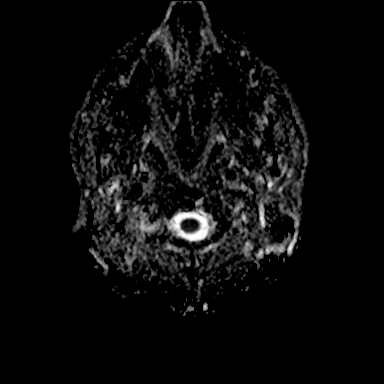
[im 27/27]
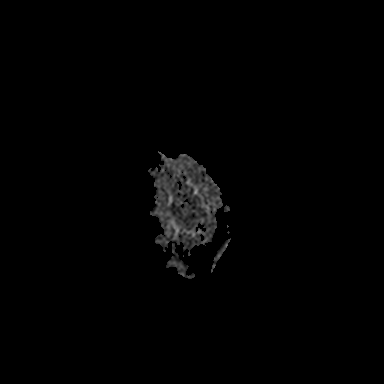

[ax dwi_exp · axial · 5.0mm · 0.60mm/px · z∈[-107,+45]mm · 2 of 27 slices shown]
[im 1/27]
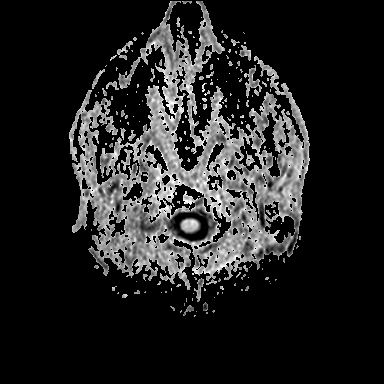
[im 27/27]
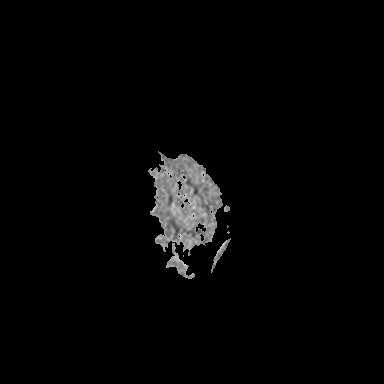

[T2 · axial · 5.0mm · 0.51mm/px · 1 of 27 slices shown]
[im 1/27]
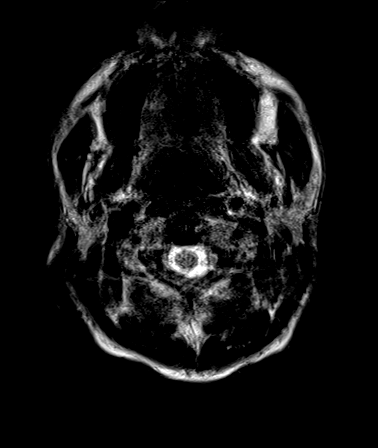

[T1 · axial · 5.0mm · 0.72mm/px · 1 of 27 slices shown (1 of 2)]
[im 1/27]
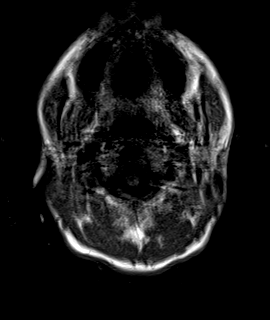

[FLAIR · axial · 5.0mm · 0.72mm/px · 1 of 27 slices shown]
[im 1/27]
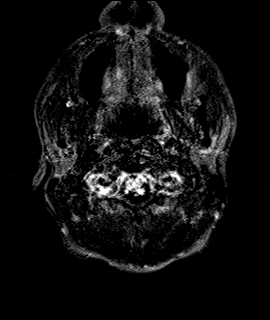

[GRE · axial · 5.0mm · 0.45mm/px · 1 of 27 slices shown]
[im 1/27]
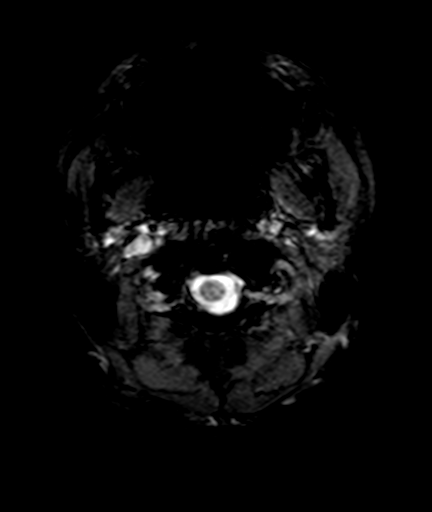

[T1 · axial · 5.0mm · 0.72mm/px · 1 of 27 slices shown (2 of 2)]
[im 1/27]
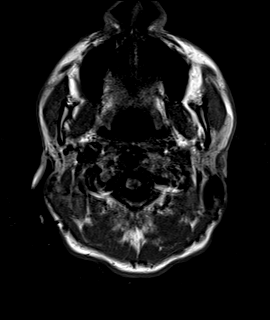

[T1 post-contrast · axial · 5.0mm · 0.72mm/px · 1 of 27 slices shown]
[im 1/27]
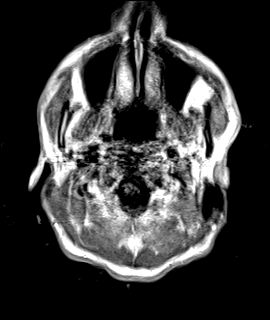

[t1_mprage_(person_name)_(person_name)2_iso_1.0 post · axial · 1.0mm · 0.97mm/px · z∈[-127,+40]mm · 9 of 176 slices shown]
[im 1/176]
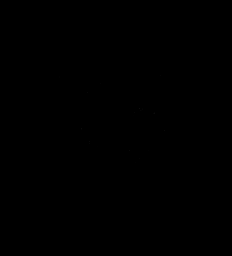
[im 22/176]
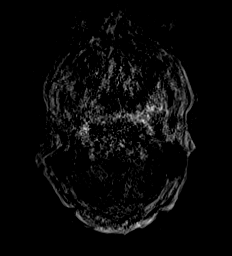
[im 44/176]
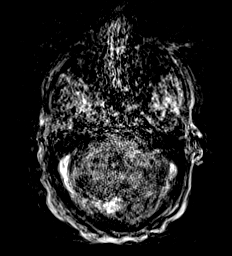
[im 66/176]
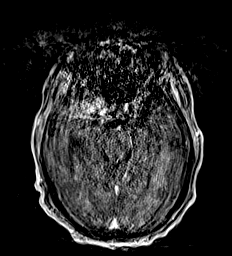
[im 88/176]
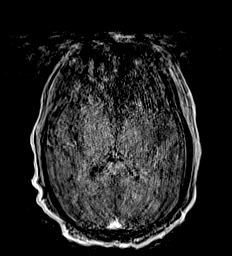
[im 110/176]
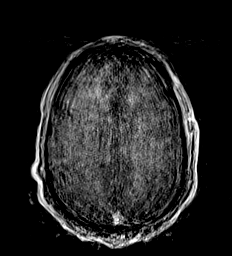
[im 132/176]
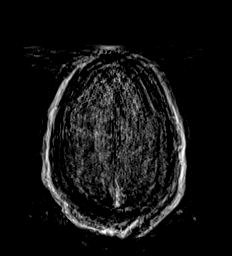
[im 154/176]
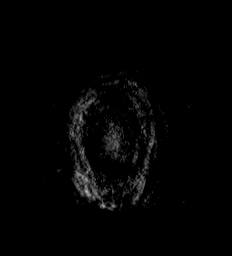
[im 176/176]
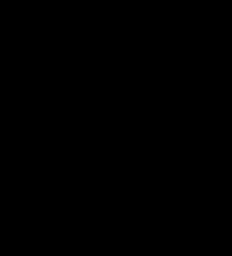

[t1_mprage_(person_name)_(person_name)2_iso_1.0 post_mpr_sag · sagittal · 1.0mm · 0.97mm/px · 8 of 150 slices shown]
[im 1/150]
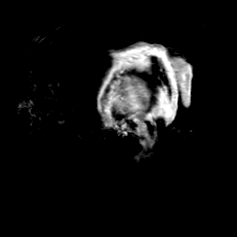
[im 22/150]
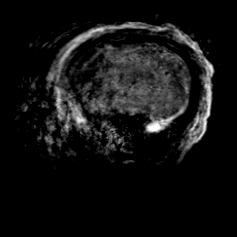
[im 43/150]
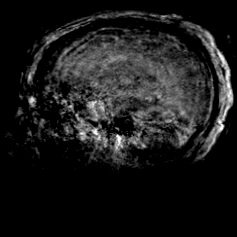
[im 64/150]
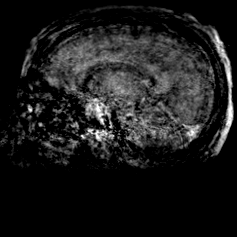
[im 86/150]
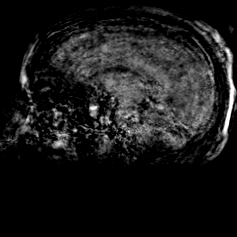
[im 107/150]
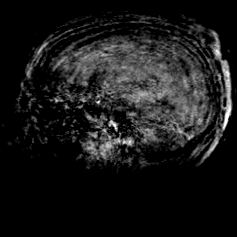
[im 128/150]
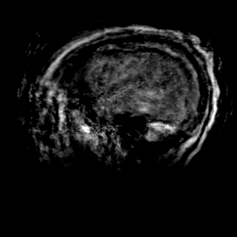
[im 150/150]
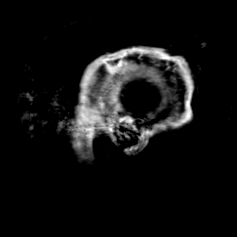

[t1_mprage_(person_name)_(person_name)2_iso_1.0 post_mpr_cor · coronal · 1.0mm · 0.97mm/px · 11 of 200 slices shown]
[im 1/200]
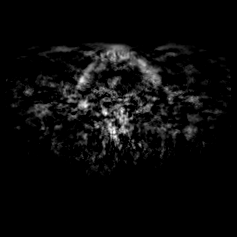
[im 20/200]
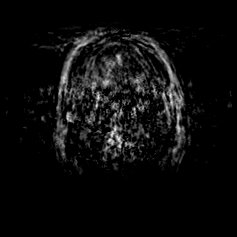
[im 40/200]
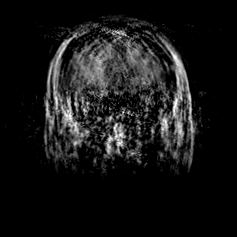
[im 60/200]
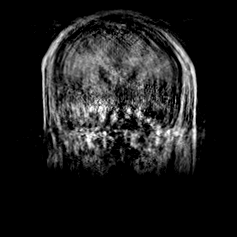
[im 80/200]
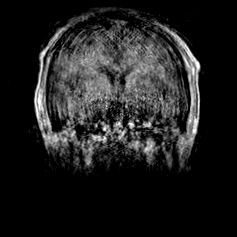
[im 100/200]
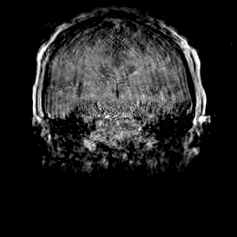
[im 120/200]
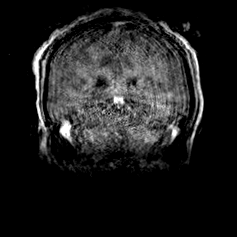
[im 140/200]
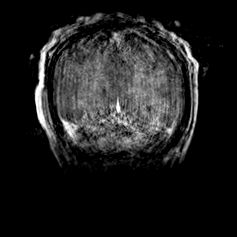
[im 160/200]
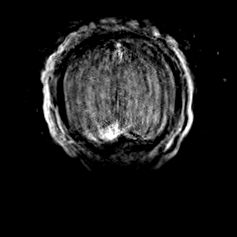
[im 180/200]
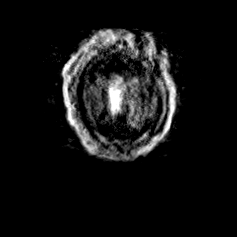
[im 200/200]
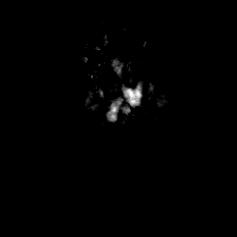

[48 of 48 positions shown; findings below may reference images not displayed]

FINDINGS: Motion degraded exam. The masticator spaces are clear. No posterior nasopharyngeal mass. Chronic left frontal lobe infarct/encephalomalacia. There is no abnormal leptomeningeal or pachymeningeal enhancement.  Ventricles are normal and symmetric for patient's age.
No evidence for acute infarct, intracranial hemorrhage or mass lesion. No extra-axial fluid collections.
No abnormal enhancing lesions following contrast administration.
Intracranial vascular flow voids are normal.
Orbital contents are unremarkable.
IMPRESSION: Severely motion degraded exam.
Otherwise no MR evidence for an underlying cause of altered mental status. No acute infarct. No hemorrhage. No midline shift. No hydrocephalus. No abnormal enhancing intracranial masses.

## 2024-03-17 IMAGING — CR XR CHEST 1 VIEW
2 series · 2 of 2 positions shown · non-contrast
Comparison: Chest radiograph dated 01/29/2024

FINAL REPORT:
XR CHEST 1 VIEW
short of breath
PROCEDURE(S): XR CHEST 1 VIEW
CLINICAL HISTORY: Age:  37 years . Gender:  Male.
INDICATION: short of breath
TECHNIQUE: 1 view of the chest

[AP (1 of 2)]
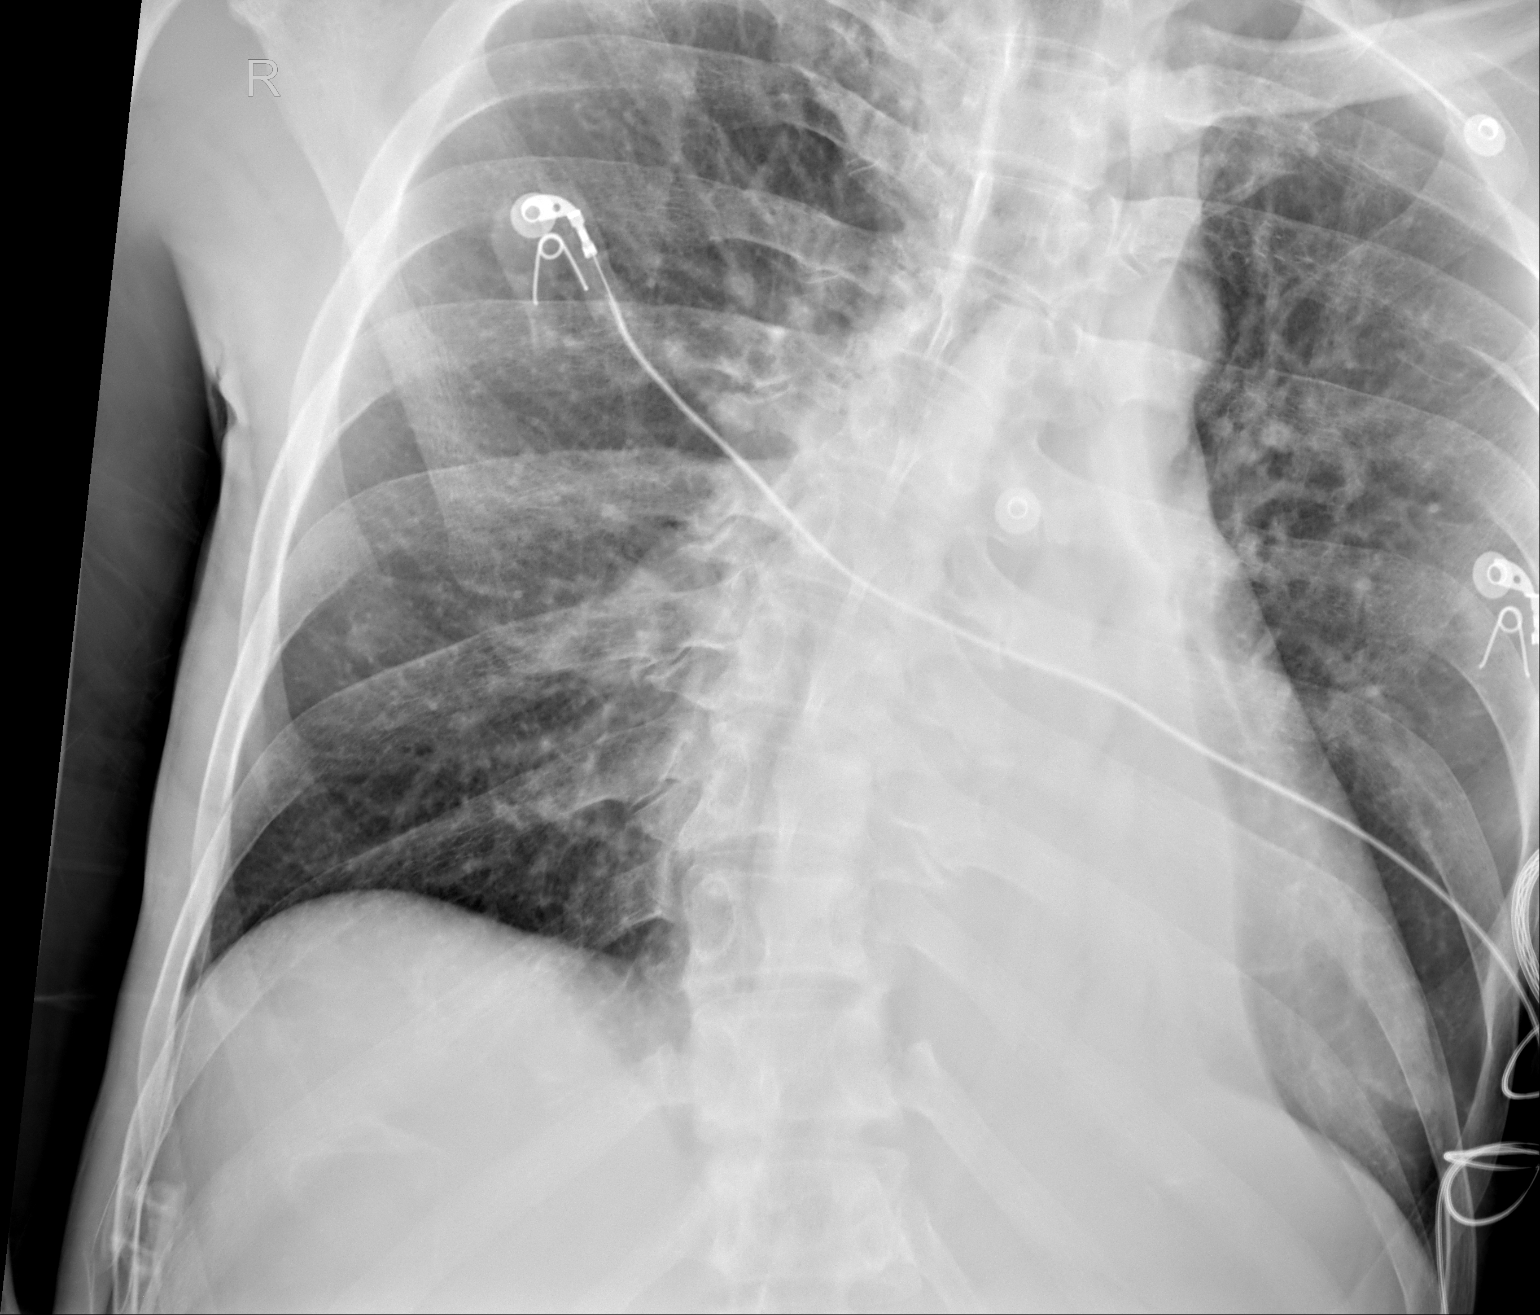

[AP (2 of 2)]
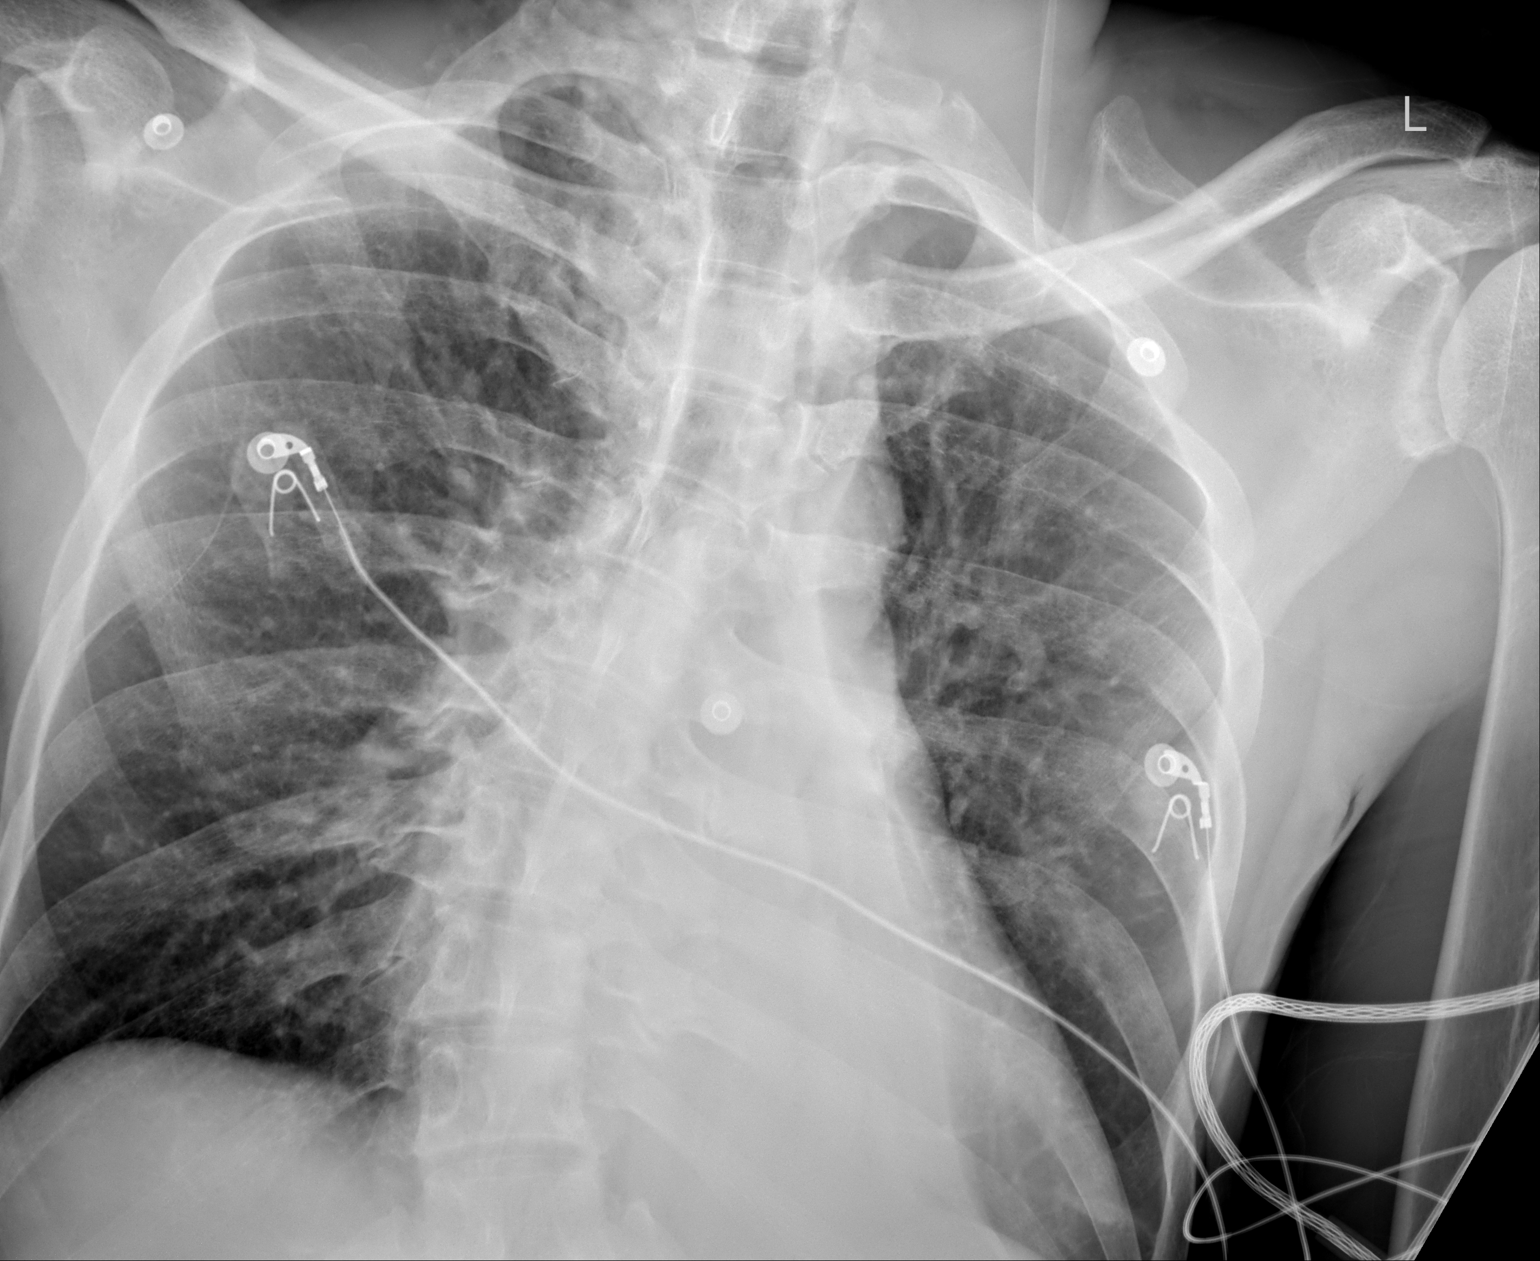

[2 of 2 positions shown; findings below may reference images not displayed]

FINDINGS: Dense left lower lobe atelectasis versus infiltrate is present. There is no
pneumothorax. The heart is normal in size. The spine is scoliotic.
IMPRESSION: 
IMPRESSION: 1.  Dense left lower lobe atelectasis versus infiltrate.
Additional findings as discussed above.
03/17/2024 [DATE] PMFrom Workstation ID: [id number]

## 2024-03-17 IMAGING — CT CT HEAD WITHOUT CONTRAST
3 of 4 series · 16 of 47 positions shown, 19 images · non-contrast
Comparison: Head CT 01/29/2024.

FINAL REPORT:
CT HEAD WITHOUT CONTRAST
Mental status change, unknown cause
PROCEDURE(S): CT HEAD WITHOUT CONTRAST
CLINICAL HISTORY: Age:  37 years . Gender:  Male.
Additional history: Altered mental status
TECHNIQUE: An unenhanced CT of the brain was performed at 5 mm slice thickness
through the brain. This CT examination was performed using one or more of the
following dose reduction techniques: Automated exposure control, adjustment of
the mA and/or kV according to patient's size and the use of iterative
reconstruction technique. DICOM format image data are available to
non-affiliated external healthcare facilities or entities on a secure,
media-free, reciprocally searchable basis with patient authorization for at
least a 12-month period after the study"

[Series 4: thins · axial · 0.49mm/px · z∈[-24,+111]mm · 10 of 256 slices shown, 13 images]
[im 20/256  brain]
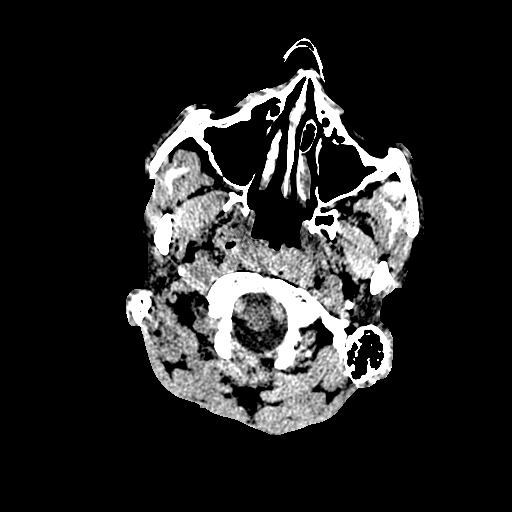
[im 20/256  bone]
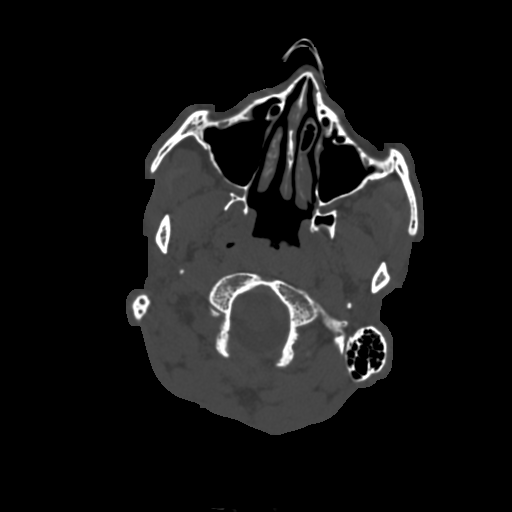
[im 40/256  brain]
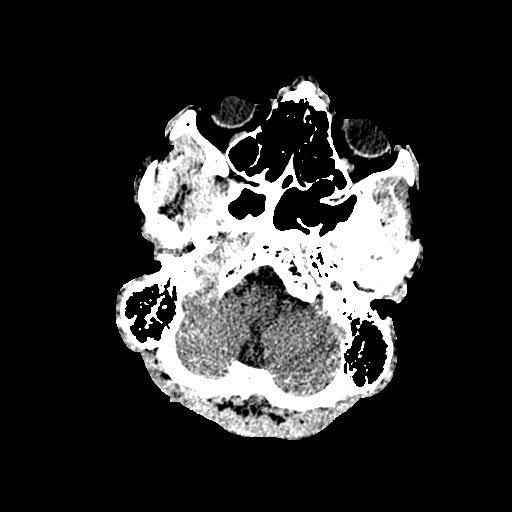
[im 69/256  brain]
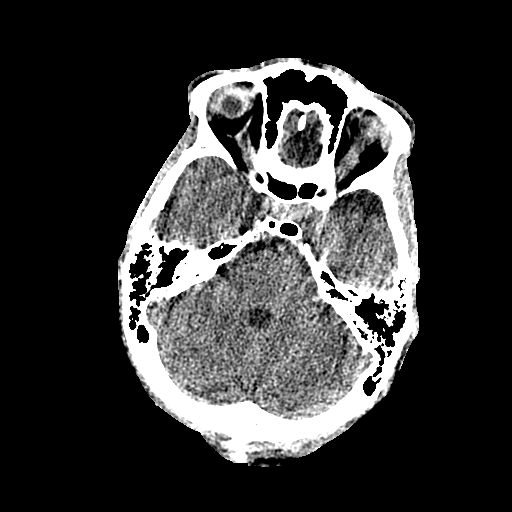
[im 89/256  brain]
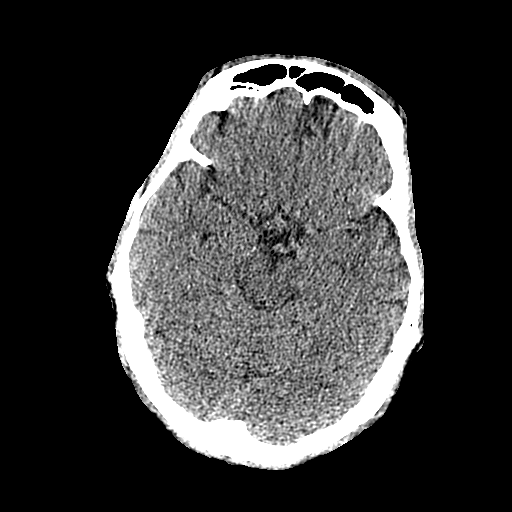
[im 118/256  brain]
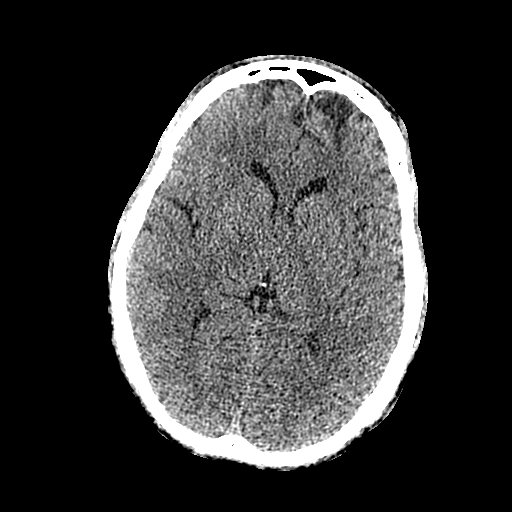
[im 118/256  bone]
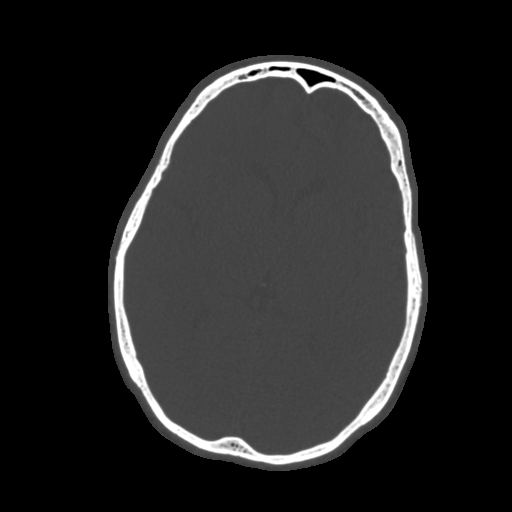
[im 138/256  brain]
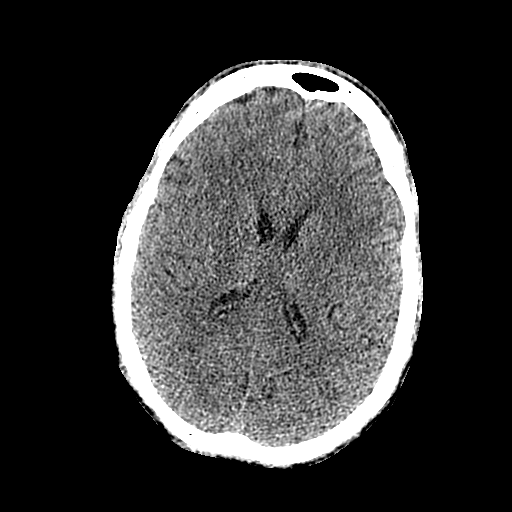
[im 167/256  brain]
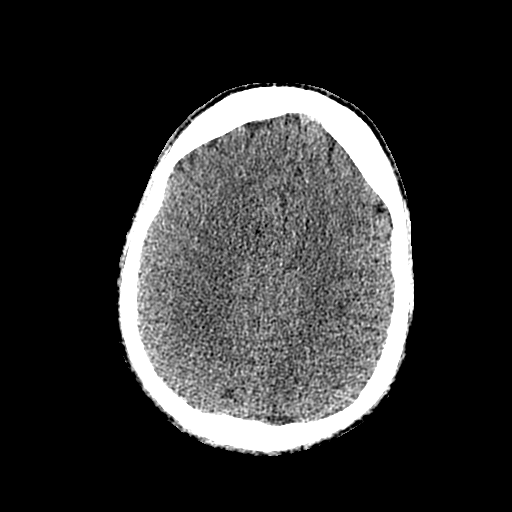
[im 187/256  brain]
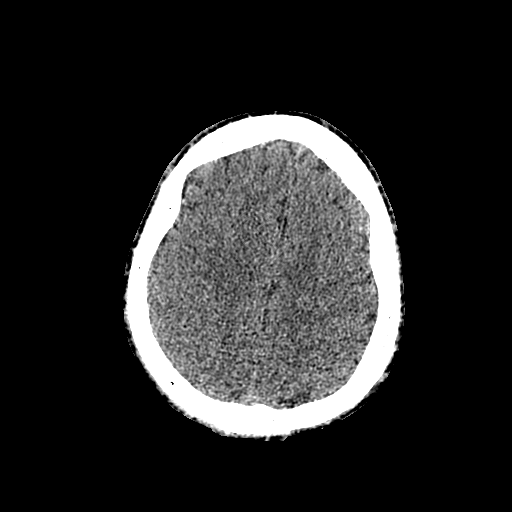
[im 216/256  brain]
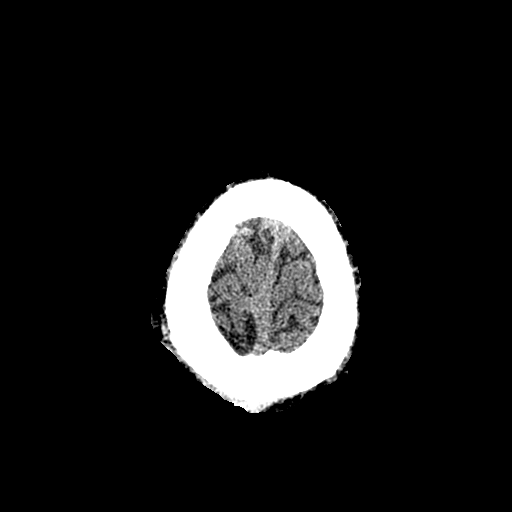
[im 216/256  bone]
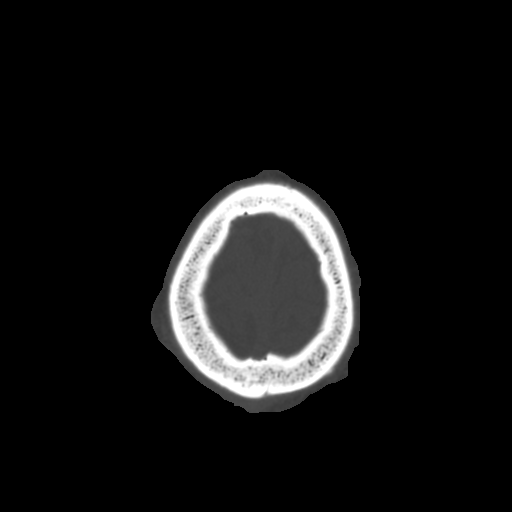
[im 236/256  brain]
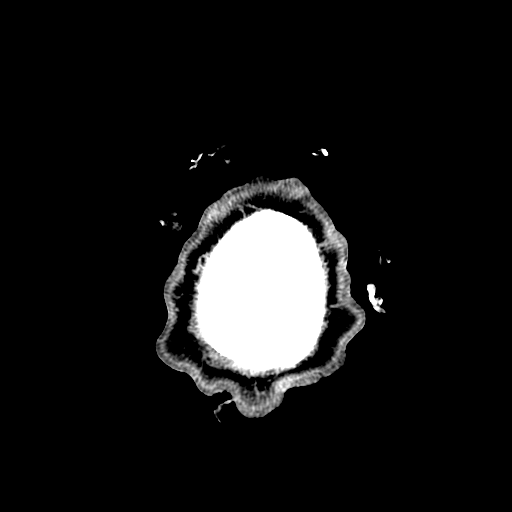

[Series 601: cor head · coronal · 0.49mm/px · 3 of 112 slices shown]
[im 38/112  brain]
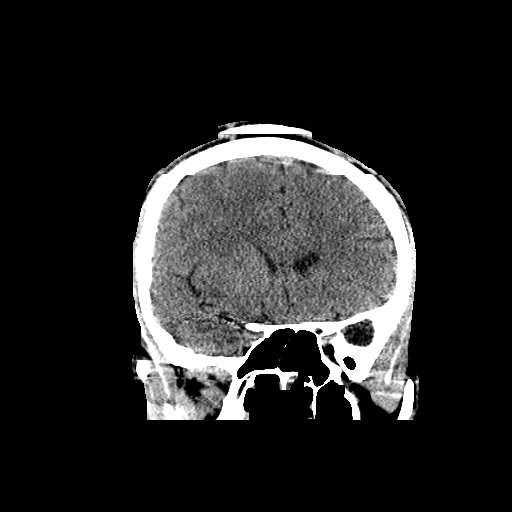
[im 50/112  brain]
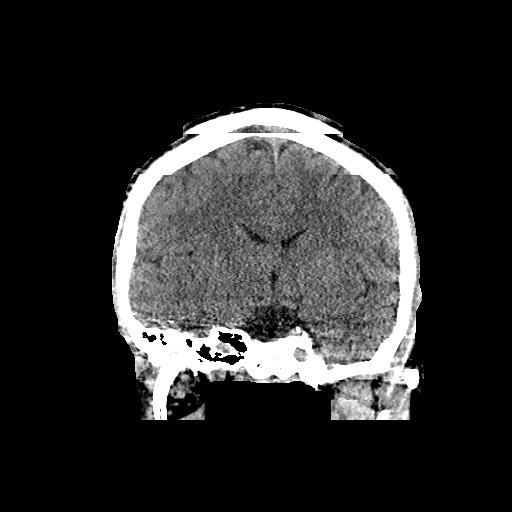
[im 62/112  brain]
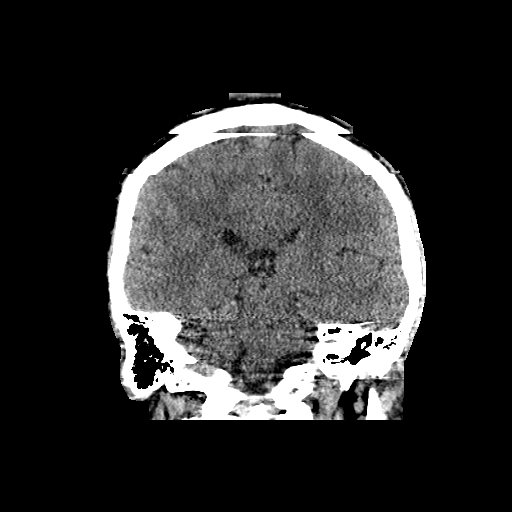

[Series 602: sag head · sagittal · 0.49mm/px · 3 of 92 slices shown]
[im 31/92  brain]
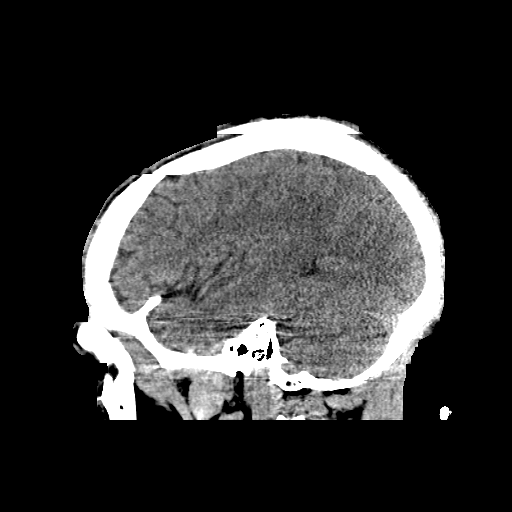
[im 46/92  brain]
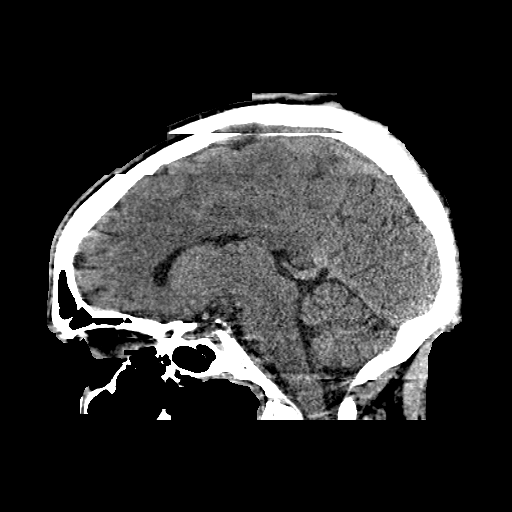
[im 61/92  brain]
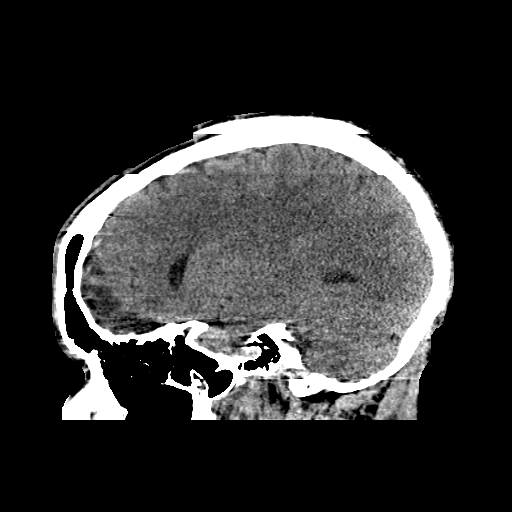

[16 of 47 positions shown; findings below may reference images not displayed]

FINDINGS: There is no acute infarct or hemorrhage. There is encephalomalacia in
the left frontal lobe..Chronic microvascular ischemic change is noted. The
ventricles are normal in size. The basal cisterns are patent.  There are no
extra-axial collections.  The globes and orbits are unremarkable.
IMPRESSION: 1.  No acute intracranial abnormality.
03/17/2024 [DATE] PMFrom Workstation ID: [id number]
